# Patient Record
Sex: Female | Born: 1990
Health system: Southern US, Community
[De-identification: ages and names within clinical notes are randomized; demographics above are authoritative.]

## PROBLEM LIST (undated history)

## (undated) ENCOUNTER — Inpatient Hospital Stay (HOSPITAL_COMMUNITY): Payer: Self-pay

## (undated) DIAGNOSIS — K219 Gastro-esophageal reflux disease without esophagitis: Secondary | ICD-10-CM

## (undated) DIAGNOSIS — F32A Depression, unspecified: Secondary | ICD-10-CM

## (undated) DIAGNOSIS — D259 Leiomyoma of uterus, unspecified: Secondary | ICD-10-CM

## (undated) DIAGNOSIS — Z8619 Personal history of other infectious and parasitic diseases: Secondary | ICD-10-CM

## (undated) DIAGNOSIS — N951 Menopausal and female climacteric states: Secondary | ICD-10-CM

## (undated) DIAGNOSIS — M1990 Unspecified osteoarthritis, unspecified site: Secondary | ICD-10-CM

## (undated) DIAGNOSIS — R87619 Unspecified abnormal cytological findings in specimens from cervix uteri: Secondary | ICD-10-CM

## (undated) DIAGNOSIS — E282 Polycystic ovarian syndrome: Secondary | ICD-10-CM

## (undated) DIAGNOSIS — D649 Anemia, unspecified: Secondary | ICD-10-CM

## (undated) DIAGNOSIS — F329 Major depressive disorder, single episode, unspecified: Secondary | ICD-10-CM

## (undated) DIAGNOSIS — N83201 Unspecified ovarian cyst, right side: Secondary | ICD-10-CM

## (undated) DIAGNOSIS — G8929 Other chronic pain: Secondary | ICD-10-CM

## (undated) DIAGNOSIS — R51 Headache: Secondary | ICD-10-CM

## (undated) DIAGNOSIS — M549 Dorsalgia, unspecified: Secondary | ICD-10-CM

## (undated) DIAGNOSIS — F419 Anxiety disorder, unspecified: Secondary | ICD-10-CM

## (undated) DIAGNOSIS — R87629 Unspecified abnormal cytological findings in specimens from vagina: Secondary | ICD-10-CM

## (undated) DIAGNOSIS — I499 Cardiac arrhythmia, unspecified: Secondary | ICD-10-CM

## (undated) DIAGNOSIS — M797 Fibromyalgia: Secondary | ICD-10-CM

## (undated) DIAGNOSIS — J45909 Unspecified asthma, uncomplicated: Secondary | ICD-10-CM

## (undated) DIAGNOSIS — IMO0002 Reserved for concepts with insufficient information to code with codable children: Secondary | ICD-10-CM

## (undated) DIAGNOSIS — E039 Hypothyroidism, unspecified: Secondary | ICD-10-CM

## (undated) HISTORY — DX: Anxiety disorder, unspecified: F41.9

## (undated) HISTORY — DX: Polycystic ovarian syndrome: E28.2

## (undated) HISTORY — DX: Reserved for concepts with insufficient information to code with codable children: IMO0002

## (undated) HISTORY — DX: Unspecified abnormal cytological findings in specimens from vagina: R87.629

## (undated) HISTORY — DX: Unspecified osteoarthritis, unspecified site: M19.90

## (undated) HISTORY — PX: COLPOSCOPY: SHX161

## (undated) HISTORY — PX: OTHER SURGICAL HISTORY: SHX169

## (undated) HISTORY — DX: Personal history of other infectious and parasitic diseases: Z86.19

## (undated) HISTORY — DX: Dorsalgia, unspecified: M54.9

## (undated) HISTORY — DX: Headache: R51

## (undated) HISTORY — DX: Fibromyalgia: M79.7

## (undated) HISTORY — DX: Menopausal and female climacteric states: N95.1

## (undated) HISTORY — DX: Hypothyroidism, unspecified: E03.9

## (undated) HISTORY — DX: Other chronic pain: G89.29

## (undated) HISTORY — DX: Unspecified abnormal cytological findings in specimens from cervix uteri: R87.619

---
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
## 2008-04-16 ENCOUNTER — Emergency Department (HOSPITAL_COMMUNITY): Admission: EM | Admit: 2008-04-16 | Discharge: 2008-04-16 | Payer: Self-pay | Admitting: Emergency Medicine

## 2008-08-04 ENCOUNTER — Emergency Department (HOSPITAL_COMMUNITY): Admission: EM | Admit: 2008-08-04 | Discharge: 2008-08-04 | Payer: Self-pay | Admitting: Emergency Medicine

## 2009-08-25 ENCOUNTER — Emergency Department: Payer: Self-pay | Admitting: Emergency Medicine

## 2010-02-18 ENCOUNTER — Emergency Department (HOSPITAL_COMMUNITY): Admission: EM | Admit: 2010-02-18 | Discharge: 2010-02-18 | Payer: Self-pay | Admitting: Emergency Medicine

## 2010-10-08 ENCOUNTER — Emergency Department (HOSPITAL_COMMUNITY)
Admission: EM | Admit: 2010-10-08 | Discharge: 2010-10-08 | Disposition: A | Discharge status: Home / Self Care | Payer: Self-pay | Attending: Emergency Medicine | Admitting: Emergency Medicine

## 2010-10-08 DIAGNOSIS — N390 Urinary tract infection, site not specified: Secondary | ICD-10-CM | POA: Insufficient documentation

## 2010-10-08 DIAGNOSIS — R071 Chest pain on breathing: Secondary | ICD-10-CM | POA: Insufficient documentation

## 2010-10-08 DIAGNOSIS — M549 Dorsalgia, unspecified: Secondary | ICD-10-CM | POA: Insufficient documentation

## 2010-10-08 DIAGNOSIS — R109 Unspecified abdominal pain: Secondary | ICD-10-CM | POA: Insufficient documentation

## 2010-10-08 DIAGNOSIS — R10816 Epigastric abdominal tenderness: Secondary | ICD-10-CM | POA: Insufficient documentation

## 2010-10-08 LAB — DIFFERENTIAL
Basophils Absolute: 0 10*3/uL (ref 0.0–0.1)
Basophils Relative: 0 % (ref 0–1)
Eosinophils Relative: 1 % (ref 0–5)
Lymphocytes Relative: 33 % (ref 12–46)
Lymphs Abs: 1.6 10*3/uL (ref 0.7–4.0)
Monocytes Relative: 9 % (ref 3–12)
Neutro Abs: 2.7 10*3/uL (ref 1.7–7.7)
Neutrophils Relative %: 57 % (ref 43–77)

## 2010-10-08 LAB — COMPREHENSIVE METABOLIC PANEL
ALT: 18 U/L (ref 0–35)
Albumin: 4 g/dL (ref 3.5–5.2)
Alkaline Phosphatase: 98 U/L (ref 39–117)
BUN: 8 mg/dL (ref 6–23)
Calcium: 9.6 mg/dL (ref 8.4–10.5)
GFR calc Af Amer: >60 mL/min (ref >60)
GFR calc non Af Amer: >60 mL/min (ref >60)
Potassium: 3.1 mEq/L — ABNORMAL LOW (ref 3.5–5.1)
Sodium: 140 mEq/L (ref 135–145)
Total Protein: 7.7 g/dL (ref 6.0–8.3)

## 2010-10-08 LAB — URINALYSIS, ROUTINE W REFLEX MICROSCOPIC
Leukocytes, UA: NEGATIVE
Urobilinogen, UA: 0.2 mg/dL (ref 0.0–1.0)

## 2010-10-08 LAB — CBC
Hemoglobin: 12.1 g/dL (ref 12.0–15.0)
MCV: 85.3 fL (ref 78.0–100.0)
RDW: 12.9 % (ref 11.5–15.5)
WBC: 4.8 10*3/uL (ref 4.0–10.5)

## 2010-10-08 LAB — URINE MICROSCOPIC-ADD ON

## 2010-10-08 LAB — LIPASE, BLOOD: Lipase: 23 U/L (ref 11–59)

## 2010-10-15 LAB — URINE MICROSCOPIC-ADD ON

## 2010-10-15 LAB — POCT PREGNANCY, URINE: Preg Test, Ur: NEGATIVE

## 2010-10-15 LAB — URINALYSIS, ROUTINE W REFLEX MICROSCOPIC
Bilirubin Urine: NEGATIVE
Glucose, UA: NEGATIVE mg/dL
Ketones, ur: NEGATIVE mg/dL
Leukocytes, UA: NEGATIVE
Nitrite: NEGATIVE
Protein, ur: NEGATIVE mg/dL
Specific Gravity, Urine: >1.03 — ABNORMAL HIGH (ref 1.005–1.030)
Urobilinogen, UA: 0.2 mg/dL (ref 0.0–1.0)
pH: 6 (ref 5.0–8.0)

## 2011-05-01 LAB — URINALYSIS, ROUTINE W REFLEX MICROSCOPIC
Bilirubin Urine: NEGATIVE
Nitrite: NEGATIVE
Protein, ur: 100 — AB
Specific Gravity, Urine: 1.01

## 2011-05-01 LAB — PREGNANCY, URINE: Preg Test, Ur: NEGATIVE

## 2011-05-01 LAB — URINE MICROSCOPIC-ADD ON

## 2011-08-01 NOTE — L&D Delivery Note (Signed)
Delivery Note At 12:16 PM a viable and healthy female was delivered via  (Presentation: Left Occiput Anterior  ).  APGAR: 8, 9; weight pending.   Placenta status: spontaneous, in tact.  Cord: 3-vessel with the following complications: None.    Anesthesia: Epidural  Episiotomy: None  Lacerations: 1st degree vaginal Suture Repair: 3.0 vicryl 2 figure of eights Est. Blood Loss (mL): 350  Mom to postpartum.  Baby to nursery-stable. Mom plans to breast and bottle feed and get outpatient circumcision for baby.  [name] 04/25/2012, 12:41 PM

## 2011-08-01 NOTE — L&D Delivery Note (Signed)
I was present for the entire 2nd and 3rd stage of labor.  There were no complications.  Jermani Eberlein L. Harraway-Smith, M.D., [place]

## 2011-08-22 ENCOUNTER — Emergency Department (HOSPITAL_COMMUNITY)
Admission: EM | Admit: 2011-08-22 | Discharge: 2011-08-22 | Disposition: A | Discharge status: Home / Self Care | Payer: BC Managed Care – PPO | Attending: Emergency Medicine | Admitting: Emergency Medicine

## 2011-08-22 ENCOUNTER — Encounter (HOSPITAL_COMMUNITY): Payer: Self-pay | Admitting: Emergency Medicine

## 2011-08-22 ENCOUNTER — Emergency Department (HOSPITAL_COMMUNITY): Payer: BC Managed Care – PPO

## 2011-08-22 DIAGNOSIS — R109 Unspecified abdominal pain: Secondary | ICD-10-CM | POA: Insufficient documentation

## 2011-08-22 DIAGNOSIS — R197 Diarrhea, unspecified: Secondary | ICD-10-CM | POA: Insufficient documentation

## 2011-08-22 DIAGNOSIS — Z331 Pregnant state, incidental: Secondary | ICD-10-CM | POA: Insufficient documentation

## 2011-08-22 DIAGNOSIS — R1110 Vomiting, unspecified: Secondary | ICD-10-CM | POA: Insufficient documentation

## 2011-08-22 DIAGNOSIS — D259 Leiomyoma of uterus, unspecified: Secondary | ICD-10-CM | POA: Insufficient documentation

## 2011-08-22 LAB — URINALYSIS, ROUTINE W REFLEX MICROSCOPIC
Bilirubin Urine: NEGATIVE
Glucose, UA: NEGATIVE mg/dL
Ketones, ur: NEGATIVE mg/dL
Nitrite: NEGATIVE
Protein, ur: 30 mg/dL — AB
Specific Gravity, Urine: >1.03 — ABNORMAL HIGH (ref 1.005–1.030)
Urobilinogen, UA: 0.2 mg/dL (ref 0.0–1.0)
pH: 6 (ref 5.0–8.0)

## 2011-08-22 LAB — HEPATIC FUNCTION PANEL: Albumin: 4 g/dL (ref 3.5–5.2)

## 2011-08-22 LAB — BASIC METABOLIC PANEL
BUN: 7 mg/dL (ref 6–23)
Glucose, Bld: 84 mg/dL (ref 70–99)
Potassium: 3.6 mEq/L (ref 3.5–5.1)

## 2011-08-22 LAB — CBC
HCT: 31 % — ABNORMAL LOW (ref 36.0–46.0)
MCHC: 34.5 g/dL (ref 30.0–36.0)
RDW: 14 % (ref 11.5–15.5)
WBC: 3.7 10*3/uL — ABNORMAL LOW (ref 4.0–10.5)

## 2011-08-22 LAB — DIFFERENTIAL
Basophils Absolute: 0 10*3/uL (ref 0.0–0.1)
Basophils Relative: 0 % (ref 0–1)
Eosinophils Absolute: 0 10*3/uL (ref 0.0–0.7)
Eosinophils Relative: 1 % (ref 0–5)
Lymphs Abs: 1.4 10*3/uL (ref 0.7–4.0)
Monocytes Absolute: 0.3 10*3/uL (ref 0.1–1.0)
Monocytes Relative: 7 % (ref 3–12)
Neutrophils Relative %: 54 % (ref 43–77)

## 2011-08-22 LAB — HCG, QUANTITATIVE, PREGNANCY: hCG, Beta Chain, Quant, S: 351 m[IU]/mL — ABNORMAL HIGH (ref <5)

## 2011-08-22 LAB — LIPASE, BLOOD: Lipase: 12 U/L (ref 11–59)

## 2011-08-22 LAB — PREGNANCY, URINE: Preg Test, Ur: POSITIVE

## 2011-08-22 LAB — ABO/RH: ABO/RH(D): O POS

## 2011-08-22 MED ORDER — ONDANSETRON HCL 4 MG PO TABS: 4.0000 mg | ORAL_TABLET | Freq: Four times a day (QID) | ORAL | Status: AC

## 2011-08-22 MED ORDER — HYDROMORPHONE HCL PF 1 MG/ML IJ SOLN
0.5000 mg | Freq: Once | INTRAMUSCULAR | Status: AC
  Administered 2011-08-22: 0.5 mg via INTRAVENOUS
  Filled 2011-08-22: qty 1

## 2011-08-22 MED ORDER — ONDANSETRON HCL 4 MG/2ML IJ SOLN
4.0000 mg | Freq: Once | INTRAMUSCULAR | Status: AC
  Administered 2011-08-22: 4 mg via INTRAVENOUS
  Filled 2011-08-22: qty 2

## 2011-08-22 NOTE — ED Notes (Signed)
Pt DC to home with mother. 

## 2011-08-22 NOTE — ED Provider Notes (Signed)
History     CSN: [id number]  Arrival date & time 08/22/11  1136   First MD Initiated Contact with Patient 08/22/11 1202      Chief Complaint  Patient presents with  . Abdominal Pain  . Emesis    (Consider location/radiation/quality/duration/timing/severity/associated sxs/prior treatment) HPI Comments: Patient states she was recently diagnosed with urinary tract infection January 17. She has also been recently diagnosed with an ovarian cyst, and a fibroid tumor on  the uterus. In spite of antibiotic therapy she continues to have continued problems with pain of the lower abdomen, and in the last few days has had problems with vomiting. She states that he had a few episodes of diarrhea on January 20. She denies seeing any blood in the stool. There's no blood in the vomitus. She's not had any injury to the abdomen. She's not had any surgery or procedures on the abdomen in the past.  Patient is a 21 y.o. female presenting with abdominal pain and vomiting. The history is provided by the patient.  Abdominal Pain The primary symptoms of the illness include abdominal pain, vomiting and diarrhea. The primary symptoms of the illness do not include shortness of breath or dysuria.  Symptoms associated with the illness do not include hematuria, frequency or back pain.  Emesis  Associated symptoms include abdominal pain and diarrhea. Pertinent negatives include no arthralgias and no cough.    History reviewed. No pertinent past medical history.  History reviewed. No pertinent past surgical history.  History reviewed. No pertinent family history.  History  Substance Use Topics  . Smoking status: Never Smoker   . Smokeless tobacco: Not on file  . Alcohol Use: Yes    OB History    Grav Para Term Preterm Abortions TAB SAB Ect Mult Living                  Review of Systems  Constitutional: Negative for activity change.       All ROS Neg except as noted in HPI  HENT: Negative for  nosebleeds and neck pain.   Eyes: Negative for photophobia and discharge.  Respiratory: Negative for cough, shortness of breath and wheezing.   Cardiovascular: Negative for chest pain and palpitations.  Gastrointestinal: Positive for vomiting, abdominal pain and diarrhea. Negative for blood in stool.  Genitourinary: Negative for dysuria, frequency and hematuria.  Musculoskeletal: Negative for back pain and arthralgias.  Skin: Negative.   Neurological: Negative for dizziness, seizures and speech difficulty.  Psychiatric/Behavioral: Negative for hallucinations and confusion.    Allergies  Other  Home Medications  No current outpatient prescriptions on file.  BP 108/62  Pulse 84  Temp(Src) 98 F (36.7 C) (Oral)  Resp 18  Ht 5\' 5"  (1.651 m)  Wt 171 lb (77.565 kg)  BMI 28.46 kg/m2  SpO2 100%  LMP 08/18/2011  Physical Exam  Nursing note and vitals reviewed. Constitutional: She is oriented to person, place, and time. She appears well-developed and well-nourished.  Non-toxic appearance.  HENT:  Head: Normocephalic.  Right Ear: Tympanic membrane and external ear normal.  Left Ear: Tympanic membrane and external ear normal.  Eyes: EOM and lids are normal. Pupils are equal, round, and reactive to light.  Neck: Normal range of motion. Neck supple. Carotid bruit is not present.  Cardiovascular: Normal rate, regular rhythm, normal heart sounds, intact distal pulses and normal pulses.   Pulmonary/Chest: Breath sounds normal. No respiratory distress.  Abdominal: Soft. Bowel sounds are normal. There is tenderness. There is  no guarding.       There is tenderness in the epigastric area to palpation. There is pain to palpation of the suprapubic area and left lower quadrant of the abdomen. The abdomen is soft with good bowel sounds.  Musculoskeletal: Normal range of motion.  Lymphadenopathy:       Head (right side): No submandibular adenopathy present.       Head (left side): No submandibular  adenopathy present.    She has no cervical adenopathy.  Neurological: She is alert and oriented to person, place, and time. She has normal strength. No cranial nerve deficit or sensory deficit.  Skin: Skin is warm and dry.  Psychiatric: She has a normal mood and affect. Her speech is normal.    ED Course  Procedures (including critical care time)   Labs Reviewed  CBC  DIFFERENTIAL  BASIC METABOLIC PANEL  HEPATIC FUNCTION PANEL  LIPASE, BLOOD  URINALYSIS, ROUTINE W REFLEX MICROSCOPIC  PREGNANCY, URINE   No results found.   Dx: Pregnancy   MDM  I have reviewed nursing notes, vital signs, and all appropriate lab and imaging results for this patient. 1:50 patient made aware of lab results including POS preg test. She was unaware of the fact that she she might be pregnant. She states she had been told she could not get pregnant because of the" fibroid tumor". Plan for ultrasound and quantitative HCG discussed with the patient.  Ultrasound results discussed with the patient. Plan for the patient to be seen by her GYN physician in 10-14 days also discussed. Patient to use Tylenol for soreness. Prescription for Zofran every 6 hours given to the patient.     Kathie Dike, [place] 08/22/11 1644

## 2011-08-22 NOTE — ED Notes (Signed)
Pt c/o lower abd pain and vomiting. Pt states she was recently diagnosed with a uti and ovarian cyst.

## 2011-08-23 NOTE — ED Provider Notes (Signed)
Medical screening examination/treatment/procedure(s) were performed by non-physician practitioner and as supervising physician I was immediately available for consultation/collaboration.    Maud Rubendall L Anelle Parlow, MD 08/23/11 0708 

## 2011-09-13 LAB — OB RESULTS CONSOLE HEPATITIS B SURFACE ANTIGEN: Hepatitis B Surface Ag: NEGATIVE

## 2011-09-13 LAB — OB RESULTS CONSOLE RPR: RPR: NONREACTIVE

## 2011-09-13 LAB — OB RESULTS CONSOLE ABO/RH: RH Type: POSITIVE

## 2011-09-13 LAB — OB RESULTS CONSOLE RUBELLA ANTIBODY, IGM: Rubella: IMMUNE

## 2011-10-14 ENCOUNTER — Inpatient Hospital Stay (HOSPITAL_COMMUNITY): Payer: Medicaid Other

## 2011-10-14 ENCOUNTER — Inpatient Hospital Stay (HOSPITAL_COMMUNITY)
Admission: AD | Admit: 2011-10-14 | Discharge: 2011-10-14 | Disposition: A | Discharge status: Home / Self Care | Payer: Medicaid Other | Source: Ambulatory Visit | Attending: Family Medicine | Admitting: Family Medicine

## 2011-10-14 ENCOUNTER — Encounter (HOSPITAL_COMMUNITY): Payer: Self-pay | Admitting: Obstetrics and Gynecology

## 2011-10-14 DIAGNOSIS — R109 Unspecified abdominal pain: Secondary | ICD-10-CM

## 2011-10-14 DIAGNOSIS — O26899 Other specified pregnancy related conditions, unspecified trimester: Secondary | ICD-10-CM

## 2011-10-14 DIAGNOSIS — R1031 Right lower quadrant pain: Secondary | ICD-10-CM | POA: Insufficient documentation

## 2011-10-14 DIAGNOSIS — O99891 Other specified diseases and conditions complicating pregnancy: Secondary | ICD-10-CM | POA: Insufficient documentation

## 2011-10-14 LAB — URINALYSIS, ROUTINE W REFLEX MICROSCOPIC
Nitrite: NEGATIVE
Specific Gravity, Urine: 1.015 (ref 1.005–1.030)
Urobilinogen, UA: 0.2 mg/dL (ref 0.0–1.0)

## 2011-10-14 LAB — URINE MICROSCOPIC-ADD ON

## 2011-10-14 LAB — CBC
HCT: 30.1 % — ABNORMAL LOW (ref 36.0–46.0)
MCHC: 33.9 g/dL (ref 30.0–36.0)
MCV: 82.9 fL (ref 78.0–100.0)
RDW: 14.3 % (ref 11.5–15.5)

## 2011-10-14 NOTE — MAU Provider Note (Signed)
Chart reviewed and agree with management and plan.  

## 2011-10-14 NOTE — MAU Note (Signed)
Pt presents to MAU with chief complaint of right sided abdominal pain that started last night while at work; 10 PM. Pts approximately [redacted] weeks pregnant. Pain is described as sharp, intermittent, awoke patient from sleep. Pt rates the pain 3/10; no medications or interventions done by patient for pain relief.

## 2011-10-14 NOTE — MAU Provider Note (Signed)
History     CSN: [id number]  Arrival date and time: 10/14/11 4098   First Provider Initiated Contact with Patient 10/14/11 [phone]      Chief Complaint  Patient presents with  . Abdominal Pain   HPI Emily Morales 20 y.o. Client of [facility].  Comes to MAU with RLQ pain which radiates to RUQ.  Had pain last night.  Was able to sleep and pain awakened her this AM.  Denies nausea and vomiting.  Denies vaginal bleeding.  Has had 2 soft BMs in past 24 hours.  History of right ovarian cyst.  Did not take any medication for pain.  OB History    Grav Para Term Preterm Abortions TAB SAB Ect Mult Living   1 0 0 0 0 0 0 0 0 0       Past Medical History  Diagnosis Date  . Cyst     Past Surgical History  Procedure Date  . No past surgeries     Family History  Problem Relation Age of Onset  . Diabetes Father   . Hypertension Father     History  Substance Use Topics  . Smoking status: Never Smoker   . Smokeless tobacco: Not on file  . Alcohol Use: No    Allergies:  Allergies  Allergen Reactions  . Other     Coconut causes a rash    Prescriptions prior to admission  Medication Sig Dispense Refill  . ibuprofen (ADVIL,MOTRIN) 800 MG tablet Take 800 mg by mouth every 8 (eight) hours as needed. Pain      . sulfamethoxazole-trimethoprim (BACTRIM DS) 800-160 MG per tablet Take 1 tablet by mouth 2 (two) times daily.        Review of Systems  Gastrointestinal: Positive for abdominal pain. Negative for nausea, vomiting, diarrhea and constipation.  Genitourinary: Negative for dysuria.       No vaginal bleeding   Physical Exam   Blood pressure 110/63, pulse 74, temperature 97.3 F (36.3 C), temperature source Oral, resp. rate 18, last menstrual period 08/18/2011.  Physical Exam  Nursing note and vitals reviewed. Constitutional: She is oriented to person, place, and time. She appears well-developed and well-nourished.  HENT:  Head: Normocephalic.  Eyes: EOM are normal.    Neck: Neck supple.  GI: Soft. Bowel sounds are normal. There is tenderness. There is guarding. There is no rebound.       FHT + Pain to palpation in RLQ and RUQ (LQ>UQ)  Musculoskeletal: Normal range of motion.  Neurological: She is alert and oriented to person, place, and time.  Skin: Skin is warm and dry.  Psychiatric: She has a normal mood and affect.    MAU Course  Procedures Results for orders placed during the hospital encounter of 10/14/11 (from the past 24 hour(s))  URINALYSIS, ROUTINE W REFLEX MICROSCOPIC     Status: Abnormal   Collection Time   10/14/11  9:30 AM      Component Value Range   Color, Urine YELLOW  YELLOW    APPearance HAZY (*) CLEAR    Specific Gravity, Urine 1.015  1.005 - 1.030    pH 7.5  5.0 - 8.0    Glucose, UA NEGATIVE  NEGATIVE (mg/dL)   Hgb urine dipstick TRACE (*) NEGATIVE    Bilirubin Urine NEGATIVE  NEGATIVE    Ketones, ur NEGATIVE  NEGATIVE (mg/dL)   Protein, ur NEGATIVE  NEGATIVE (mg/dL)   Urobilinogen, UA 0.2  0.0 - 1.0 (mg/dL)   Nitrite  NEGATIVE  NEGATIVE    Leukocytes, UA TRACE (*) NEGATIVE   URINE MICROSCOPIC-ADD ON     Status: Abnormal   Collection Time   10/14/11  9:30 AM      Component Value Range   Squamous Epithelial / LPF FEW (*) RARE    WBC, UA 0-2  <3 (WBC/hpf)   RBC / HPF 0-2  <3 (RBC/hpf)   Bacteria, UA FEW (*) RARE    Urine-Other RARE YEAST    CBC     Status: Abnormal   Collection Time   10/14/11  9:57 AM      Component Value Range   WBC 4.2  4.0 - 10.5 (K/uL)   RBC 3.63 (*) 3.87 - 5.11 (MIL/uL)   Hemoglobin 10.2 (*) 12.0 - 15.0 (g/dL)   HCT 16.1 (*) 09.6 - 46.0 (%)   MCV 82.9  78.0 - 100.0 (fL)   MCH 28.1  26.0 - 34.0 (pg)   MCHC 33.9  30.0 - 36.0 (g/dL)   RDW 04.5  40.9 - 81.1 (%)   Platelets 191  150 - 400 (K/uL)   MDM Consult with Dr. Shawnie Pons re: plan of care - will R/O kidney stone and ovarian cyst.  Clinical Data: Right lower quadrant pain  OBSTETRIC <14 WK ULTRASOUND  Technique: Transabdominal ultrasound  was performed for evaluation  of the gestation as well as the maternal uterus and adnexal  regions.  Comparison: None.  Intrauterine gestational sac: Single  Yolk sac: Not identified  Embryo: Present  Cardiac Activity: Present  Heart Rate: 163 bpm  CRL: 52.7 mm 12w fived [place] EDC: 04/22/2012  Maternal uterus/Adnexae:  Normal ovaries. No subchorionic hemorrhage. No free fluid. Small  fibroid within the uterus measuring 1 cm.  IMPRESSION:  1. Single uterine gestation with embryonic normal cardiac  activity.  2. Estimated gestational age by crown-rump length equals 12 weeks  5 days  Clinical Data: Right lower quadrant pain, right flank pain, [redacted]  weeks pregnant  RENAL/URINARY TRACT ULTRASOUND COMPLETE  Comparison: Nine  Findings:  Right Kidney = 10.6 cm. No evidence of hydronephrosis, cyst, mass,  or stone.  Left kidney = 10.1 cm. No evidence of hydronephrosis, cyst, mass,  or stone.  Bladder: Normal bladder. Ureteral jets noted.  IMPRESSION:  Normal renal ultrasound.  1358 - currently client has no pain and is ready for discharge  Assessment and Plan  abd pain in pregnancy  Plan Keep appointments scheduled in the office Drink at least 8 8-oz glasses of water every day.' Take Tylenol 325 mg 2 tablets by mouth every 4 hours if needed for pain. No reason identified for pain today - tests were normal.  No subchorionic hemorrhage, no ovarian cysts.  Small fibroid noted but do not anticipate it interfering with the pregnancy. Call your doctor if your symptoms worsen.  Emily Morales 10/14/2011, 9:57 AM

## 2011-10-14 NOTE — Discharge Instructions (Signed)
Keep appointments scheduled in the office Drink at least 8 8-oz glasses of water every day. Take Tylenol 325 mg 2 tablets by mouth every 4 hours if needed for pain. Your tests were normal today.  No subchorionic hemorrhage, no ovarian cysts, no kidney stones seen.  Small uterine fibroid noted but do not anticipate it interfering with the pregnancy. Call your doctor if your symptoms worsen.

## 2012-01-31 ENCOUNTER — Inpatient Hospital Stay (HOSPITAL_COMMUNITY)
Admission: AD | Admit: 2012-01-31 | Discharge: 2012-01-31 | Disposition: A | Discharge status: Home / Self Care | Payer: Medicaid Other | Source: Ambulatory Visit | Attending: Obstetrics & Gynecology | Admitting: Obstetrics & Gynecology

## 2012-01-31 ENCOUNTER — Encounter (HOSPITAL_COMMUNITY): Payer: Self-pay

## 2012-01-31 DIAGNOSIS — N949 Unspecified condition associated with female genital organs and menstrual cycle: Secondary | ICD-10-CM | POA: Insufficient documentation

## 2012-01-31 DIAGNOSIS — R109 Unspecified abdominal pain: Secondary | ICD-10-CM | POA: Insufficient documentation

## 2012-01-31 HISTORY — DX: Unspecified ovarian cyst, right side: N83.201

## 2012-01-31 LAB — URINALYSIS, ROUTINE W REFLEX MICROSCOPIC
Bilirubin Urine: NEGATIVE
Glucose, UA: NEGATIVE mg/dL
Protein, ur: NEGATIVE mg/dL
Specific Gravity, Urine: 1.02 (ref 1.005–1.030)
Urobilinogen, UA: 0.2 mg/dL (ref 0.0–1.0)

## 2012-01-31 LAB — URINE MICROSCOPIC-ADD ON

## 2012-01-31 MED ORDER — CYCLOBENZAPRINE HCL 10 MG PO TABS: 10.0000 mg | ORAL_TABLET | Freq: Three times a day (TID) | ORAL | Status: AC | PRN

## 2012-01-31 NOTE — MAU Note (Signed)
WMuhammad, CNM notified pt in MAU for c/o lower abd pain, none present now, u/a pending, no ctx's on monitor thus far, efm tracing reactive, CNM to see pt in MAU.

## 2012-01-31 NOTE — MAU Provider Note (Signed)
History     CSN: [id number]  Arrival date and time: 01/31/12 4098   First Provider Initiated Contact with Patient 01/31/12 1031      Chief Complaint  Patient presents with  . Pelvic Pain   HPI  Pt is here with report of groin pain x 2 days that increases with pain with walking.  Denies vaginal bleeding or leaking of fluid.    Past Medical History  Diagnosis Date  . Ovarian cyst, right     Past Surgical History  Procedure Date  . No past surgeries     Family History  Problem Relation Age of Onset  . Diabetes Father   . Hypertension Father   . Other Neg Hx     History  Substance Use Topics  . Smoking status: Never Smoker   . Smokeless tobacco: Never Used  . Alcohol Use: No    Allergies:  Allergies  Allergen Reactions  . Other     Coconut causes a rash    Prescriptions prior to admission  Medication Sig Dispense Refill  . Prenatal Vit-Fe Fumarate-FA (PRENATAL MULTIVITAMIN) TABS Take 1 tablet by mouth daily.        Review of Systems  Musculoskeletal:       Groin pain  All other systems reviewed and are negative.   Physical Exam   Blood pressure 110/67, pulse 99, temperature 98.8 F (37.1 C), temperature source Oral, resp. rate 18, height 5\' 5"  (1.651 m), weight 84.823 kg (187 lb), last menstrual period 08/18/2011, SpO2 100.00%.  Physical Exam  Constitutional: She is oriented to person, place, and time. She appears well-developed and well-nourished.  HENT:  Head: Normocephalic.  Neck: Normal range of motion. Neck supple.  Cardiovascular: Normal rate, regular rhythm and normal heart sounds.   Respiratory: Effort normal and breath sounds normal.  GI: Soft. There is no tenderness.  Genitourinary: No bleeding around the vagina. Vaginal discharge (mucusy) found.       Cervix - closed  Neurological: She is alert and oriented to person, place, and time.  Skin: Skin is warm and dry.   FHR 130's, +accels, reactive Toco - none  MAU Course   Procedures Results for orders placed during the hospital encounter of 01/31/12 (from the past 24 hour(s))  URINALYSIS, ROUTINE W REFLEX MICROSCOPIC     Status: Abnormal   Collection Time   01/31/12  9:25 AM      Component Value Range   Color, Urine YELLOW  YELLOW   APPearance CLEAR  CLEAR   Specific Gravity, Urine 1.020  1.005 - 1.030   pH 6.0  5.0 - 8.0   Glucose, UA NEGATIVE  NEGATIVE mg/dL   Hgb urine dipstick SMALL (*) NEGATIVE   Bilirubin Urine NEGATIVE  NEGATIVE   Ketones, ur NEGATIVE  NEGATIVE mg/dL   Protein, ur NEGATIVE  NEGATIVE mg/dL   Urobilinogen, UA 0.2  0.0 - 1.0 mg/dL   Nitrite NEGATIVE  NEGATIVE   Leukocytes, UA SMALL (*) NEGATIVE  URINE MICROSCOPIC-ADD ON     Status: Abnormal   Collection Time   01/31/12  9:25 AM      Component Value Range   Squamous Epithelial / LPF FEW (*) RARE   WBC, UA 0-2  <3 WBC/hpf   RBC / HPF 0-2  <3 RBC/hpf   Urine-Other MUCOUS PRESENT       Assessment and Plan  Round Ligament Pain  Plan: DC to home RX Flexeril Urine to culture  [facility] 01/31/2012, 10:32 AM

## 2012-01-31 NOTE — MAU Note (Signed)
Patient states she has been having pelvic pain since 7-1. Was seen at [facility] yesterday and had blood in the urine, but was not treated for a UTI. Patient states pain is worse with walking. Denies any bleeding or leaking and reports good fetal movement.

## 2012-03-28 ENCOUNTER — Encounter (HOSPITAL_COMMUNITY): Payer: Self-pay | Admitting: *Deleted

## 2012-03-28 ENCOUNTER — Inpatient Hospital Stay (HOSPITAL_COMMUNITY)
Admission: AD | Admit: 2012-03-28 | Discharge: 2012-03-28 | Disposition: A | Discharge status: Home / Self Care | Payer: Medicaid Other | Source: Ambulatory Visit | Attending: Obstetrics and Gynecology | Admitting: Obstetrics and Gynecology

## 2012-03-28 DIAGNOSIS — R300 Dysuria: Secondary | ICD-10-CM | POA: Insufficient documentation

## 2012-03-28 DIAGNOSIS — R319 Hematuria, unspecified: Secondary | ICD-10-CM | POA: Insufficient documentation

## 2012-03-28 DIAGNOSIS — Z3490 Encounter for supervision of normal pregnancy, unspecified, unspecified trimester: Secondary | ICD-10-CM

## 2012-03-28 DIAGNOSIS — O99891 Other specified diseases and conditions complicating pregnancy: Secondary | ICD-10-CM | POA: Insufficient documentation

## 2012-03-28 LAB — URINALYSIS, ROUTINE W REFLEX MICROSCOPIC
Glucose, UA: NEGATIVE mg/dL
Nitrite: NEGATIVE
Protein, ur: NEGATIVE mg/dL

## 2012-03-28 LAB — URINE MICROSCOPIC-ADD ON

## 2012-03-28 NOTE — MAU Provider Note (Signed)
History     CSN: [id number]  Arrival date and time: 03/28/12 0102   None     Chief Complaint  Patient presents with  . Hematuria  . Dysuria   HPI Emily Morales 21 y.o. female  G1P0000 at [redacted]w[redacted]d presenting with small amounts of blood in toilet.   Today, patient noticed a tinge of blood in the toilet after she urinated, when she wiped she did not see any more blood and she has not noted any more blood in urine, in the toilet, in discharge since that time. Paitnet also noted some slight burnign with urination x 2-3x earlier today which has now resolved when she urinates. Patient wants to make sure it was not her mucus plug and she is going into labor. She thinks it was when she peed but she notes there was also some normal discharge and could have been meixed with that. Denies suprapubic pressure, polyuria, nausea, vomiting, fevers, CVA tenderness.   Patient denies contractions/decreased fetal movement/abnormal discharge/rush of fluid.   OB History    Grav Para Term Preterm Abortions TAB SAB Ect Mult Living   1 0 0 0 0 0 0 0 0 0       Past Medical History  Diagnosis Date  . Ovarian cyst, right     Past Surgical History  Procedure Date  . No past surgeries     Family History  Problem Relation Age of Onset  . Diabetes Father   . Hypertension Father   . Other Neg Hx     History  Substance Use Topics  . Smoking status: Never Smoker   . Smokeless tobacco: Never Used  . Alcohol Use: No    Allergies:  Allergies  Allergen Reactions  . Other     Coconut causes a rash    Prescriptions prior to admission  Medication Sig Dispense Refill  . Prenatal Vit-Fe Fumarate-FA (PRENATAL MULTIVITAMIN) TABS Take 1 tablet by mouth daily.        ROS see HPI Physical Exam   Blood pressure 133/70, pulse 98, temperature 98 F (36.7 C), temperature source Oral, resp. rate 18, height 5\' 5"  (1.651 m), weight 92.806 kg (204 lb 9.6 oz), last menstrual period 08/18/2011.  Physical Exam   Constitutional: She is oriented to person, place, and time. She appears well-developed and well-nourished.  HENT:  Head: Normocephalic and atraumatic.  Mouth/Throat: No oropharyngeal exudate.  Eyes: Conjunctivae and EOM are normal. Pupils are equal, round, and reactive to light.  Neck: Normal range of motion. Neck supple.  Cardiovascular: Normal rate and regular rhythm.  Exam reveals no gallop and no friction rub.   No murmur heard. Respiratory: Effort normal and breath sounds normal. She has no wheezes. She has no rales.  GI: Soft. Bowel sounds are normal.       Gravid  Genitourinary: Vagina normal and uterus normal. No vaginal discharge (normal pregnancy discharge noted) found.  Musculoskeletal: Normal range of motion. She exhibits no edema.       No CVA tenderness  Neurological: She is alert and oriented to person, place, and time.  Skin: Skin is warm and dry.   Dilation: Closed Effacement (%): Thick Cervical Position: Posterior Station: -3 Exam by:: Josuha Fontanez,MD  FHT-145 baseline, accels present, no decels, moderate variability. No contractions.   MAU Course  Procedures  MDM UA-poor sampling with many squamous cells and mucus present, small LE, few bacteria, 3-6 RBC.  Will send urine for culture and only treat if positive given resolution  of dysuria.    Assessment and Plan  Emily Morales 21 y.o. female  G1P0000 at [redacted]w[redacted]d presenting with likely blood tinged discharge.   Patient likely had some blood tinged discharge in toilet.  Will treat for UTI if urine culture positive. Patient given [facility].    Tana Conch 03/28/2012, 1:42 AM   Case discussed with Jacklyn Shell

## 2012-03-28 NOTE — MAU Provider Note (Signed)
I have seen and examined this patient and agree the above assessment. CRESENZO-DISHMAN,Annabel Gibeau 03/28/2012 6:38 AM

## 2012-03-28 NOTE — MAU Note (Signed)
Pt G1 at 35.3wks with specs of blood in urine tonight, none on tissue, feeling pressure and pain with urination.

## 2012-04-03 LAB — OB RESULTS CONSOLE GC/CHLAMYDIA: Chlamydia: POSITIVE

## 2012-04-14 ENCOUNTER — Encounter (HOSPITAL_COMMUNITY): Payer: Self-pay | Admitting: *Deleted

## 2012-04-14 ENCOUNTER — Inpatient Hospital Stay (HOSPITAL_COMMUNITY)
Admission: AD | Admit: 2012-04-14 | Discharge: 2012-04-14 | Disposition: A | Discharge status: Home / Self Care | Payer: Medicaid Other | Source: Ambulatory Visit | Attending: Obstetrics & Gynecology | Admitting: Obstetrics & Gynecology

## 2012-04-14 DIAGNOSIS — L293 Anogenital pruritus, unspecified: Secondary | ICD-10-CM | POA: Insufficient documentation

## 2012-04-14 DIAGNOSIS — B373 Candidiasis of vulva and vagina: Secondary | ICD-10-CM | POA: Insufficient documentation

## 2012-04-14 DIAGNOSIS — B379 Candidiasis, unspecified: Secondary | ICD-10-CM

## 2012-04-14 DIAGNOSIS — O2390 Unspecified genitourinary tract infection in pregnancy, unspecified trimester: Secondary | ICD-10-CM | POA: Insufficient documentation

## 2012-04-14 DIAGNOSIS — B3731 Acute candidiasis of vulva and vagina: Secondary | ICD-10-CM | POA: Insufficient documentation

## 2012-04-14 DIAGNOSIS — O99891 Other specified diseases and conditions complicating pregnancy: Secondary | ICD-10-CM | POA: Insufficient documentation

## 2012-04-14 DIAGNOSIS — B49 Unspecified mycosis: Secondary | ICD-10-CM

## 2012-04-14 MED ORDER — FLUCONAZOLE 150 MG PO TABS
150.0000 mg | ORAL_TABLET | Freq: Once | ORAL | Status: AC
  Administered 2012-04-14: 150 mg via ORAL
  Filled 2012-04-14: qty 1

## 2012-04-14 NOTE — MAU Provider Note (Signed)
  History     CSN: [id number]  Arrival date and time: 04/14/12 1834   None     Chief Complaint  Patient presents with  . Rupture of Membranes   HPI  Pt is here with report of leaking of fluid that started at 1300 today.  No vaginal bleeding or contractions.  +fetal movement.  Also reports vaginal itching and irritation since taking antibiotic for chlamydia.  Past Medical History  Diagnosis Date  . Ovarian cyst, right     Past Surgical History  Procedure Date  . No past surgeries     Family History  Problem Relation Age of Onset  . Diabetes Father   . Hypertension Father   . Other Neg Hx     History  Substance Use Topics  . Smoking status: Never Smoker   . Smokeless tobacco: Never Used  . Alcohol Use: No    Allergies:  Allergies  Allergen Reactions  . Other     Coconut causes a rash    Prescriptions prior to admission  Medication Sig Dispense Refill  . hydrocortisone cream 1 % Apply 1 application topically 2 (two) times daily as needed. For eczema        Review of Systems  Genitourinary:       Vaginal itching and discharge  All other systems reviewed and are negative.   Physical Exam   Blood pressure 136/68, pulse 94, temperature 98.3 F (36.8 C), temperature source Oral, resp. rate 18, height 5\' 6"  (1.676 m), weight 94.62 kg (208 lb 9.6 oz), last menstrual period 08/18/2011.  Physical Exam  Constitutional: She is oriented to person, place, and time. She appears well-developed and well-nourished. No distress.  HENT:  Head: Normocephalic.  Neck: Normal range of motion. Neck supple.  Cardiovascular: Normal rate, regular rhythm and normal heart sounds.   Respiratory: Effort normal and breath sounds normal. No respiratory distress.  GI: Soft. There is no tenderness. There is no CVA tenderness.  Genitourinary: Uterus is enlarged. Cervix exhibits no motion tenderness and no discharge. No bleeding around the vagina. Vaginal discharge (white, thick,  curd-like) found.       Cervix - closed  Musculoskeletal: Normal range of motion.  Neurological: She is alert and oriented to person, place, and time.  Skin: Skin is warm and dry.  Psychiatric: She has a normal mood and affect.    MAU Course  Procedures  Results for orders placed during the hospital encounter of 04/14/12 (from the past 24 hour(s))  AMNISURE RUPTURE OF MEMBRANE (ROM)     Status: Normal   Collection Time   04/14/12  8:55 PM      Component Value Range   Amnisure ROM NEGATIVE    FHR 130's, +accels, reactive Toco 4-8   Assessment and Plan  Yeast Infection  Plan: DC to home Diflucan 150 mg in MAU Keep scheduled appointment  [facility] 04/14/2012, 7:41 PM

## 2012-04-14 NOTE — MAU Note (Signed)
pt reports she has some leaking of fluid 2 times today and now having some more leak out. Reprots having pressure/ctx off an on this afternoon.

## 2012-04-19 ENCOUNTER — Encounter (HOSPITAL_COMMUNITY): Payer: Self-pay | Admitting: *Deleted

## 2012-04-19 ENCOUNTER — Telehealth (HOSPITAL_COMMUNITY): Payer: Self-pay | Admitting: *Deleted

## 2012-04-19 NOTE — Telephone Encounter (Signed)
Preadmission screen  

## 2012-04-23 ENCOUNTER — Encounter (HOSPITAL_COMMUNITY): Payer: Self-pay | Admitting: *Deleted

## 2012-04-23 ENCOUNTER — Inpatient Hospital Stay (HOSPITAL_COMMUNITY)
Admission: AD | Admit: 2012-04-23 | Discharge: 2012-04-24 | Disposition: A | Discharge status: Home / Self Care | Payer: Medicaid Other | Source: Ambulatory Visit | Attending: Obstetrics and Gynecology | Admitting: Obstetrics and Gynecology

## 2012-04-23 DIAGNOSIS — O479 False labor, unspecified: Secondary | ICD-10-CM

## 2012-04-23 DIAGNOSIS — Z3400 Encounter for supervision of normal first pregnancy, unspecified trimester: Secondary | ICD-10-CM

## 2012-04-23 NOTE — Progress Notes (Signed)
Dr Fara Boros discussing plan of care with pt. Will let pt walk awhile and reck cerivx.

## 2012-04-23 NOTE — MAU Note (Signed)
Pt G1 at 39.1wks having contractions since 1200 today.  Seen in the office today SVE 2 cm.

## 2012-04-24 ENCOUNTER — Encounter (HOSPITAL_COMMUNITY): Payer: Self-pay | Admitting: *Deleted

## 2012-04-24 ENCOUNTER — Inpatient Hospital Stay (HOSPITAL_COMMUNITY)
Admission: AD | Admit: 2012-04-24 | Discharge: 2012-04-27 | DRG: 774 | Disposition: A | Discharge status: Home / Self Care | Payer: Medicaid Other | Source: Ambulatory Visit | Attending: Obstetrics & Gynecology | Admitting: Obstetrics & Gynecology

## 2012-04-24 DIAGNOSIS — O99892 Other specified diseases and conditions complicating childbirth: Secondary | ICD-10-CM | POA: Diagnosis present

## 2012-04-24 DIAGNOSIS — Z22330 Carrier of Group B streptococcus: Secondary | ICD-10-CM

## 2012-04-24 MED ORDER — ZOLPIDEM TARTRATE 5 MG PO TABS
5.0000 mg | ORAL_TABLET | Freq: Once | ORAL | Status: AC
  Administered 2012-04-24: 5 mg via ORAL
  Filled 2012-04-24: qty 1

## 2012-04-24 NOTE — MAU Provider Note (Signed)
  History     CSN: [id number]  Arrival date and time: 04/23/12 2211   First Provider Initiated Contact with Patient 04/23/12 2245      Chief Complaint  Patient presents with  . Labor Eval   HPI Comments: 21 yo G1 @ 39.1 by 6wk U/S followed at [facility] presents with contractions starting around noon today.  She was seen in the office at 10am and had her membranes stripped by Dr. Despina Hidden.  Since that time she has had contractions, increasing in frequency from q6 min to q3-4 min. No LOF or vaginal bleeding; +FM.    OB History    Grav Para Term Preterm Abortions TAB SAB Ect Mult Living   1 0 0 0 0 0 0 0 0 0       Past Medical History  Diagnosis Date  . Ovarian cyst, right   . Abnormal Pap smear   . Hx of chlamydia infection   . Headache     Past Surgical History  Procedure Date  . No past surgeries   . Colposcopy     Family History  Problem Relation Age of Onset  . Diabetes Father   . Hypertension Father   . Other Neg Hx   . Cancer Maternal Grandmother     lung  . Heart disease Paternal Grandmother     History  Substance Use Topics  . Smoking status: Never Smoker   . Smokeless tobacco: Never Used  . Alcohol Use: No    Allergies:  Allergies  Allergen Reactions  . Other     Coconut causes a rash    Prescriptions prior to admission  Medication Sig Dispense Refill  . Emollient (EUCERIN) lotion Apply topically as needed.      . hydrocortisone cream 1 % Apply 1 application topically 2 (two) times daily as needed. For eczema        Review of Systems  Constitutional: Negative for fever.  Respiratory: Negative for shortness of breath.   Cardiovascular: Negative for chest pain.  Gastrointestinal: Positive for abdominal pain.  Neurological: Negative for dizziness and headaches.   Physical Exam   Blood pressure 131/71, pulse 91, temperature 98.1 F (36.7 C), temperature source Oral, resp. rate 18, height 5\' 6"  (1.676 m), weight 95.89 kg (211 lb 6.4 oz), last  menstrual period 08/18/2011.  Physical Exam  Constitutional: She appears well-developed and well-nourished. No distress.  Cardiovascular: Normal rate and regular rhythm.   Respiratory: Effort normal and breath sounds normal. No respiratory distress.  GI:       gravid  Musculoskeletal: She exhibits no edema.  Neurological: She is alert.  Skin: Skin is warm and dry.   2/60/-3 Dilation:  (SAME) Effacement (%): 60 Presentation: Vertex Exam by:: DR Shoshana Johal  FHT: 150, mod variability, + accel, no decel Toco: irregular ctx, q6-10; mild to mod  MAU Course  Procedures  MDM Pt with irregular ctx on monitor; will have pt walk x 2 hours then recheck  12:46AM: cervix unchanged on recheck  Assessment and Plan  22 yo G1 @ 39.1 p/w contractions, unchanged cervix after 2 hours -D/c home with labor precautions, kick counts -F/u at [facility] as scheduled  Mckenzye Cutright 04/24/2012, 12:42 AM

## 2012-04-24 NOTE — MAU Provider Note (Signed)
Attestation of Attending Supervision of Advanced Practitioner (CNM/NP): Evaluation and management procedures were performed by the Advanced Practitioner under my supervision and collaboration.  I have reviewed the Advanced Practitioner's note and chart, and I agree with the management and plan.  Valgene Deloatch 04/24/2012 7:24 AM

## 2012-04-25 ENCOUNTER — Encounter (HOSPITAL_COMMUNITY): Payer: Self-pay | Admitting: Anesthesiology

## 2012-04-25 ENCOUNTER — Encounter (HOSPITAL_COMMUNITY): Payer: Self-pay | Admitting: *Deleted

## 2012-04-25 ENCOUNTER — Inpatient Hospital Stay (HOSPITAL_COMMUNITY): Payer: Medicaid Other | Admitting: Anesthesiology

## 2012-04-25 DIAGNOSIS — O99892 Other specified diseases and conditions complicating childbirth: Secondary | ICD-10-CM

## 2012-04-25 DIAGNOSIS — O9989 Other specified diseases and conditions complicating pregnancy, childbirth and the puerperium: Secondary | ICD-10-CM

## 2012-04-25 LAB — CBC
HCT: 29.2 % — ABNORMAL LOW (ref 36.0–46.0)
Hemoglobin: 9.6 g/dL — ABNORMAL LOW (ref 12.0–15.0)
MCH: 26.9 pg (ref 26.0–34.0)
RBC: 3.57 MIL/uL — ABNORMAL LOW (ref 3.87–5.11)

## 2012-04-25 LAB — PREPARE RBC (CROSSMATCH)

## 2012-04-25 LAB — RPR: RPR Ser Ql: NONREACTIVE

## 2012-04-25 LAB — ABO/RH: ABO/RH(D): O POS

## 2012-04-25 MED ORDER — OXYCODONE-ACETAMINOPHEN 5-325 MG PO TABS
1.0000 | ORAL_TABLET | ORAL | Status: DC | PRN
  Administered 2012-04-26: 1 via ORAL
  Filled 2012-04-25: qty 1

## 2012-04-25 MED ORDER — PENICILLIN G POTASSIUM 5000000 UNITS IJ SOLR
5.0000 10*6.[IU] | Freq: Once | INTRAVENOUS | Status: AC
  Administered 2012-04-25: 5 10*6.[IU] via INTRAVENOUS
  Filled 2012-04-25: qty 5

## 2012-04-25 MED ORDER — INFLUENZA VIRUS VACC SPLIT PF IM SUSP
0.5000 mL | INTRAMUSCULAR | Status: AC
  Administered 2012-04-25: 0.5 mL via INTRAMUSCULAR

## 2012-04-25 MED ORDER — DIPHENHYDRAMINE HCL 50 MG/ML IJ SOLN: 12.5000 mg | INTRAMUSCULAR | Status: DC | PRN

## 2012-04-25 MED ORDER — LANOLIN HYDROUS EX OINT: TOPICAL_OINTMENT | CUTANEOUS | Status: DC | PRN

## 2012-04-25 MED ORDER — ONDANSETRON HCL 4 MG PO TABS: 4.0000 mg | ORAL_TABLET | ORAL | Status: DC | PRN

## 2012-04-25 MED ORDER — ZOLPIDEM TARTRATE 5 MG PO TABS: 5.0000 mg | ORAL_TABLET | Freq: Every evening | ORAL | Status: DC | PRN

## 2012-04-25 MED ORDER — CITRIC ACID-SODIUM CITRATE 334-500 MG/5ML PO SOLN
30.0000 mL | ORAL | Status: DC | PRN
  Filled 2012-04-25: qty 15

## 2012-04-25 MED ORDER — FENTANYL CITRATE 0.05 MG/ML IJ SOLN: 100.0000 ug | INTRAMUSCULAR | Status: DC | PRN

## 2012-04-25 MED ORDER — SENNOSIDES-DOCUSATE SODIUM 8.6-50 MG PO TABS
2.0000 | ORAL_TABLET | Freq: Every day | ORAL | Status: DC
  Administered 2012-04-25 – 2012-04-26 (×2): 2 via ORAL

## 2012-04-25 MED ORDER — TERBUTALINE SULFATE 1 MG/ML IJ SOLN: 0.2500 mg | Freq: Once | INTRAMUSCULAR | Status: DC | PRN

## 2012-04-25 MED ORDER — FENTANYL 2.5 MCG/ML BUPIVACAINE 1/10 % EPIDURAL INFUSION (WH - ANES)
14.0000 mL/h | INTRAMUSCULAR | Status: DC
  Administered 2012-04-25: 14 mL/h via EPIDURAL
  Filled 2012-04-25 (×2): qty 60

## 2012-04-25 MED ORDER — LIDOCAINE HCL (PF) 1 % IJ SOLN
30.0000 mL | INTRAMUSCULAR | Status: DC | PRN
  Filled 2012-04-25: qty 30

## 2012-04-25 MED ORDER — FENTANYL 2.5 MCG/ML BUPIVACAINE 1/10 % EPIDURAL INFUSION (WH - ANES)
INTRAMUSCULAR | Status: DC | PRN
  Administered 2012-04-25: 13 mL/h via EPIDURAL

## 2012-04-25 MED ORDER — PENICILLIN G POTASSIUM 5000000 UNITS IJ SOLR
2.5000 10*6.[IU] | INTRAVENOUS | Status: DC
  Administered 2012-04-25: 2.5 10*6.[IU] via INTRAVENOUS
  Filled 2012-04-25 (×6): qty 2.5

## 2012-04-25 MED ORDER — IBUPROFEN 600 MG PO TABS
600.0000 mg | ORAL_TABLET | Freq: Four times a day (QID) | ORAL | Status: DC | PRN
  Administered 2012-04-25: 600 mg via ORAL
  Filled 2012-04-25: qty 1

## 2012-04-25 MED ORDER — EPHEDRINE 5 MG/ML INJ
10.0000 mg | INTRAVENOUS | Status: DC | PRN
  Filled 2012-04-25: qty 4

## 2012-04-25 MED ORDER — PHENYLEPHRINE 40 MCG/ML (10ML) SYRINGE FOR IV PUSH (FOR BLOOD PRESSURE SUPPORT): 80.0000 ug | PREFILLED_SYRINGE | INTRAVENOUS | Status: DC | PRN

## 2012-04-25 MED ORDER — OXYCODONE-ACETAMINOPHEN 5-325 MG PO TABS: 1.0000 | ORAL_TABLET | ORAL | Status: DC | PRN

## 2012-04-25 MED ORDER — LACTATED RINGERS IV SOLN
500.0000 mL | INTRAVENOUS | Status: DC | PRN
  Administered 2012-04-25: 1000 mL via INTRAVENOUS

## 2012-04-25 MED ORDER — PRENATAL MULTIVITAMIN CH
1.0000 | ORAL_TABLET | Freq: Every day | ORAL | Status: DC
  Administered 2012-04-26 – 2012-04-27 (×2): 1 via ORAL
  Filled 2012-04-25 (×2): qty 1

## 2012-04-25 MED ORDER — IBUPROFEN 600 MG PO TABS
600.0000 mg | ORAL_TABLET | Freq: Four times a day (QID) | ORAL | Status: DC
  Administered 2012-04-25 – 2012-04-27 (×7): 600 mg via ORAL
  Filled 2012-04-25 (×7): qty 1

## 2012-04-25 MED ORDER — OXYTOCIN 40 UNITS IN LACTATED RINGERS INFUSION - SIMPLE MED
1.0000 m[IU]/min | INTRAVENOUS | Status: DC
  Administered 2012-04-25: 2 m[IU]/min via INTRAVENOUS
  Filled 2012-04-25: qty 1000

## 2012-04-25 MED ORDER — TETANUS-DIPHTH-ACELL PERTUSSIS 5-2.5-18.5 LF-MCG/0.5 IM SUSP
0.5000 mL | Freq: Once | INTRAMUSCULAR | Status: AC
  Administered 2012-04-25: 0.5 mL via INTRAMUSCULAR
  Filled 2012-04-25 (×2): qty 0.5

## 2012-04-25 MED ORDER — ONDANSETRON HCL 4 MG/2ML IJ SOLN: 4.0000 mg | INTRAMUSCULAR | Status: DC | PRN

## 2012-04-25 MED ORDER — BENZOCAINE-MENTHOL 20-0.5 % EX AERO
1.0000 "application " | INHALATION_SPRAY | CUTANEOUS | Status: DC | PRN
  Administered 2012-04-26: 1 via TOPICAL
  Filled 2012-04-25 (×2): qty 56

## 2012-04-25 MED ORDER — OXYTOCIN 40 UNITS IN LACTATED RINGERS INFUSION - SIMPLE MED
62.5000 mL/h | Freq: Once | INTRAVENOUS | Status: AC
  Administered 2012-04-25: 62.5 mL/h via INTRAVENOUS

## 2012-04-25 MED ORDER — PROMETHAZINE HCL 25 MG/ML IJ SOLN
12.5000 mg | Freq: Four times a day (QID) | INTRAMUSCULAR | Status: DC | PRN
  Administered 2012-04-25: 25 mg via INTRAMUSCULAR
  Filled 2012-04-25: qty 1

## 2012-04-25 MED ORDER — EPHEDRINE 5 MG/ML INJ: 10.0000 mg | INTRAVENOUS | Status: DC | PRN

## 2012-04-25 MED ORDER — ACETAMINOPHEN 325 MG PO TABS: 650.0000 mg | ORAL_TABLET | ORAL | Status: DC | PRN

## 2012-04-25 MED ORDER — MORPHINE SULFATE 4 MG/ML IJ SOLN
4.0000 mg | Freq: Once | INTRAMUSCULAR | Status: AC
  Administered 2012-04-25: 4 mg via INTRAMUSCULAR
  Filled 2012-04-25: qty 1

## 2012-04-25 MED ORDER — LACTATED RINGERS IV SOLN: 500.0000 mL | Freq: Once | INTRAVENOUS | Status: DC

## 2012-04-25 MED ORDER — LACTATED RINGERS IV SOLN
INTRAVENOUS | Status: DC
  Administered 2012-04-25: 08:00:00 via INTRAVENOUS

## 2012-04-25 MED ORDER — LIDOCAINE HCL (PF) 1 % IJ SOLN
INTRAMUSCULAR | Status: DC | PRN
  Administered 2012-04-25 (×2): 9 mL

## 2012-04-25 MED ORDER — DIBUCAINE 1 % RE OINT
1.0000 "application " | TOPICAL_OINTMENT | RECTAL | Status: DC | PRN
  Filled 2012-04-25 (×2): qty 28

## 2012-04-25 MED ORDER — ONDANSETRON HCL 4 MG/2ML IJ SOLN: 4.0000 mg | Freq: Four times a day (QID) | INTRAMUSCULAR | Status: DC | PRN

## 2012-04-25 MED ORDER — DIPHENHYDRAMINE HCL 25 MG PO CAPS: 25.0000 mg | ORAL_CAPSULE | Freq: Four times a day (QID) | ORAL | Status: DC | PRN

## 2012-04-25 MED ORDER — PHENYLEPHRINE 40 MCG/ML (10ML) SYRINGE FOR IV PUSH (FOR BLOOD PRESSURE SUPPORT)
80.0000 ug | PREFILLED_SYRINGE | INTRAVENOUS | Status: DC | PRN
  Filled 2012-04-25: qty 5

## 2012-04-25 MED ORDER — WITCH HAZEL-GLYCERIN EX PADS: 1.0000 "application " | MEDICATED_PAD | CUTANEOUS | Status: DC | PRN

## 2012-04-25 MED ORDER — SIMETHICONE 80 MG PO CHEW: 80.0000 mg | CHEWABLE_TABLET | ORAL | Status: DC | PRN

## 2012-04-25 MED ORDER — OXYTOCIN BOLUS FROM INFUSION
500.0000 mL | Freq: Once | INTRAVENOUS | Status: DC
  Filled 2012-04-25: qty 500

## 2012-04-25 NOTE — Anesthesia Procedure Notes (Signed)
Epidural Patient location during procedure: OB Start time: 04/25/2012 4:30 AM End time: 04/25/2012 4:36 AM  Staffing Anesthesiologist: Sandrea Hughs Performed by: anesthesiologist   Preanesthetic Checklist Completed: patient identified, site marked, surgical consent, pre-op evaluation, timeout performed, IV checked, risks and benefits discussed and monitors and equipment checked  Epidural Patient position: sitting Prep: site prepped and draped and DuraPrep Patient monitoring: continuous pulse ox and blood pressure Approach: midline Injection technique: LOR air  Needle:  Needle type: Tuohy  Needle gauge: 17 G Needle length: 9 cm and 9 Needle insertion depth: 5 cm cm Catheter type: closed end flexible Catheter size: 19 Gauge Catheter at skin depth: 10 cm Test dose: negative and Other  Assessment Sensory level: T9 Events: blood not aspirated, injection not painful, no injection resistance, negative IV test and no paresthesia  Additional Notes Reason for block:procedure for pain

## 2012-04-25 NOTE — Anesthesia Preprocedure Evaluation (Signed)
Anesthesia Evaluation  Patient identified by MRN, date of birth, ID band Patient awake    Reviewed: Allergy & Precautions, H&P , NPO status , Patient's Chart, lab work & pertinent test results  Airway Mallampati: II TM Distance: >3 FB Neck ROM: full    Dental No notable dental hx.    Pulmonary neg pulmonary ROS,    Pulmonary exam normal       Cardiovascular negative cardio ROS      Neuro/Psych negative psych ROS   GI/Hepatic negative GI ROS, Neg liver ROS,   Endo/Other  negative endocrine ROS  Renal/GU negative Renal ROS  negative genitourinary   Musculoskeletal negative musculoskeletal ROS (+)   Abdominal (+) + obese,   Peds negative pediatric ROS (+)  Hematology negative hematology ROS (+)   Anesthesia Other Findings   Reproductive/Obstetrics (+) Pregnancy                           Anesthesia Physical Anesthesia Plan  ASA: II  Anesthesia Plan: Epidural   Post-op Pain Management:    Induction:   Airway Management Planned:   Additional Equipment:   Intra-op Plan:   Post-operative Plan:   Informed Consent: I have reviewed the patients History and Physical, chart, labs and discussed the procedure including the risks, benefits and alternatives for the proposed anesthesia with the patient or authorized representative who has indicated his/her understanding and acceptance.     Plan Discussed with:   Anesthesia Plan Comments:         Anesthesia Quick Evaluation

## 2012-04-25 NOTE — Progress Notes (Signed)
Emily Morales is a 21 y.o. G1P0000 at [redacted]w[redacted]d by admitted for active labor.  Subjective: Comfortable w/epidural, no c/o.  Objective: BP 121/72  Pulse 92  Temp 99 F (37.2 C) (Oral)  Resp 20  Ht 5\' 6"  (1.676 m)  Wt 95.936 kg (211 lb 8 oz)  BMI 34.14 kg/m2  SpO2 99%  LMP 08/18/2011      FHT:  FHR: 155 bpm, variability: moderate,  accelerations:  Present,  decelerations:  Present variable, late UC:   regular, every 2-6 minutes SVE:   Dilation: 6 Effacement (%): 80 Station: -2 Exam by:: Fabian November, cnm-student  Labs: Lab Results  Component Value Date   WBC 10.7* 04/25/2012   HGB 9.6* 04/25/2012   HCT 29.2* 04/25/2012   MCV 81.8 04/25/2012   PLT 211 04/25/2012    Assessment / Plan: Spontaneous labor, progressing normally GBS positive on PCN  Labor: Progressing normally Fetal Wellbeing:  Category II Pain Control:  Epidural I/D:  n/a Anticipated MOD:  NSVD  Emily Morales 04/25/2012, 9:04 AM

## 2012-04-25 NOTE — Progress Notes (Signed)
Patient ID: Emily Morales, female   DOB: October 23, 1990, 23 y.o.   MRN: [id number] S:  Patient blocked and comfortable  O:   Filed Vitals:   04/25/12 0902  BP: 134/71  Pulse: 87  Temp:   Resp: 18   SVE:  7/100/-2, bulging bag AROM, clear  FHT:  140, moderate variability, accels present, occasional variable CTX:  q 1 to 6 minutes  A/P Slow progression in active labor. AROM, start pitocin.  Napoleon Form, MD

## 2012-04-25 NOTE — MAU Provider Note (Signed)
  Emily Morales is  21 y.o. G1P0000 at [redacted]w[redacted]d presents complaining of regular, painful ctx. She denies VB and LOF, and reports +FM.  She has been seen in MAU on two other occasions over the last 2 days for regular ctx and was found to be in false labor. She has been obtaining care at [facility].  Obstetrical/Gynecological History: Menstrual History: OB History    Grav Para Term Preterm Abortions TAB SAB Ect Mult Living   1 0 0 0 0 0 0 0 0 0       Patient's last menstrual period was 08/18/2011.     Past Medical History: Past Medical History  Diagnosis Date  . Ovarian cyst, right   . Abnormal Pap smear   . Hx of chlamydia infection   . Headache     Past Surgical History: Past Surgical History  Procedure Date  . No past surgeries   . Colposcopy     Family History: Family History  Problem Relation Age of Onset  . Diabetes Father   . Hypertension Father   . Other Neg Hx   . Cancer Maternal Grandmother     lung  . Heart disease Paternal Grandmother     Social History: History  Substance Use Topics  . Smoking status: Never Smoker   . Smokeless tobacco: Never Used  . Alcohol Use: No    Allergies:  Allergies  Allergen Reactions  . Other     Coconut causes a rash    Meds:  Prescriptions prior to admission  Medication Sig Dispense Refill  . Emollient (EUCERIN) lotion Apply topically as needed.      . hydrocortisone cream 1 % Apply 1 application topically 2 (two) times daily as needed. For eczema        Review of Systems - Please refer to the aforementioned patients' reports.     Physical Exam  Blood pressure 127/74, pulse 106, temperature 98.8 F (37.1 C), temperature source Oral, resp. rate 20, height 5\' 6"  (1.676 m), weight 95.936 kg (211 lb 8 oz), last menstrual period 08/18/2011, SpO2 100.00%. GENERAL: Well-developed, well-nourished female in no acute distress.  LUNGS: Clear to auscultation bilaterally.  HEART: Regular rate and rhythm. ABDOMEN: Soft,  nontender, nondistended, gravid.  FHT:  Baseline rate 150 bpm   Variability moderate  Accelerations present   Decelerations none Contractions: Every 4-5 mins  Dilation: 3 Effacement (%): 80 Cervical Position: Middle Station: -3 Presentation: Vertex Exam by:: M.Topp,RN (second exam with no change after walking) Labs: No results found for this or any previous visit (from the past 24 hour(s)). Imaging Studies:  No results found.  Assessment: IUP@39 .3 Latent labor   Plan: Therapeutic rest with IM Morphine. Recheck in an hr or two.  Lawernce Pitts 9/26/20131:17 AM

## 2012-04-25 NOTE — Progress Notes (Signed)
Patient ID: Emily Morales, female   DOB: 1991/02/21, 21 y.o.   MRN: [id number] S:  Baby having deep variables. O2 given, pt repositioned.   Pt blocked and feeling pressure.  O:   Filed Vitals:   04/25/12 1003 04/25/12 1032 04/25/12 1102 04/25/12 1132  BP: 130/67 127/79 142/82 110/61  Pulse: 87 96 94 88  Temp:      TempSrc:  Oral    Resp: 20 20 18 20   Height:      Weight:      SpO2:         SVE:  7-8/80/-2 at first check.   FHTs:  140, moderate variability, accels present, variables with each contraction to 90s with good recovery.  A/P Progressing well on ptiocin. Was about to place IUPC and FSE but patient now complete and +2.  Will proceed to delivery.  Napoleon Form, MD

## 2012-04-25 NOTE — Anesthesia Postprocedure Evaluation (Signed)
  Anesthesia Post-op Note  Patient: Emily Morales  Procedure(s) Performed: * No procedures listed *  Patient Location: Mother/Baby  Anesthesia Type: Epidural  Level of Consciousness: awake  Airway and Oxygen Therapy: Patient Spontanous Breathing  Post-op Pain: mild  Post-op Assessment: Patient's Cardiovascular Status Stable and Respiratory Function Stable  Post-op Vital Signs: stable  Complications: No apparent anesthesia complications

## 2012-04-25 NOTE — H&P (Signed)
Emily Morales is a 21 y.o. female G1P0 @ 39.3 wks presenting for spontaneous onset of labor with cervical change while in MAU after therapeutic rest with Morphine. Maternal Medical History:  Reason for admission: Reason for admission: contractions.  Contractions: Onset was 1-2 hours ago.   Frequency: regular.   Perceived severity is strong.    Fetal activity: Perceived fetal activity is normal.   Last perceived fetal movement was within the past hour.      OB History    Grav Para Term Preterm Abortions TAB SAB Ect Mult Living   1 0 0 0 0 0 0 0 0 0      Past Medical History  Diagnosis Date  . Ovarian cyst, right   . Abnormal Pap smear   . Hx of chlamydia infection   . Headache    Past Surgical History  Procedure Date  . No past surgeries   . Colposcopy    Family History: family history includes Cancer in her maternal grandmother; Diabetes in her father; Heart disease in her paternal grandmother; and Hypertension in her father.  There is no history of Other. Social History:  reports that she has never smoked. She has never used smokeless tobacco. She reports that she does not drink alcohol or use illicit drugs.   Prenatal Transfer Tool  Maternal Diabetes: No Genetic Screening: Normal Maternal Ultrasounds/Referrals: Normal Fetal Ultrasounds or other Referrals:  None Maternal Substance Abuse:  No Significant Maternal Medications:  None Significant Maternal Lab Results:  Lab values include: Other:  chlamydia on 04/03/12 Other Comments:  None  Review of Systems  Constitutional: Negative.   Eyes: Negative.   Respiratory: Negative.   Cardiovascular: Negative.   Gastrointestinal: Negative.   Genitourinary: Negative.   Musculoskeletal: Negative.   Skin: Negative.   Neurological: Negative.   Psychiatric/Behavioral: Negative.     Dilation: 4 Effacement (%): 80 Station: -2 Exam by:: M.Topp,RN Blood pressure 130/57, pulse 97, temperature 98.8 F (37.1 C), temperature  source Oral, resp. rate 20, height 5\' 6"  (1.676 m), weight 95.936 kg (211 lb 8 oz), last menstrual period 08/18/2011, SpO2 100.00%. Maternal Exam:  Uterine Assessment: Contraction strength is moderate.  Contraction frequency is regular.   Abdomen: Patient reports no abdominal tenderness.   Fetal Exam Fetal Monitor Review: Mode: ultrasound.   Baseline rate: 140.  Variability: moderate (6-25 bpm).   Pattern: accelerations present and no decelerations.    Fetal State Assessment: Category I - tracings are normal.     Physical Exam  Constitutional: She is oriented to person, place, and time. She appears well-developed.  HENT:  Head: Normocephalic.  Neck: Normal range of motion.  Cardiovascular: Normal rate.   Respiratory: Effort normal.  GI: Soft.  Genitourinary: Vagina normal.  Musculoskeletal: Normal range of motion.  Neurological: She is alert and oriented to person, place, and time.  Skin: Skin is warm and dry.  Psychiatric: She has a normal mood and affect.    Prenatal labs: ABO, Rh: O/Positive/-- (02/13 0000) Antibody: Negative (02/13 0000) Rubella: Immune (02/13 0000) RPR: Nonreactive (02/13 0000)  HBsAg: Negative (02/13 0000)  HIV: Non-reactive (02/13 0000)  GBS: Positive (09/04 0000)   Assessment/Plan: IUP@39 .3  Early labor GBS positive Cat I fetal tracing  Admit, efm per policy, analgesia prn, PCN for GBS anticipate SVD.  Lawernce Pitts 04/25/2012, 3:22 AM

## 2012-04-25 NOTE — Progress Notes (Signed)
Emily Morales is a 21 y.o. G1P0000 at [redacted]w[redacted]d by admitted for spontaneous labor.  Subjective: Comfortable w/epidural  Objective: BP 107/53  Pulse 106  Temp 98.5 F (36.9 C) (Oral)  Resp 18  Ht 5\' 6"  (1.676 m)  Wt 95.936 kg (211 lb 8 oz)  BMI 34.14 kg/m2  SpO2 99%  LMP 08/18/2011      FHT:  FHR: 150 bpm, variability: moderate,  accelerations:  Present,  decelerations:  Present variables UC:   irregular, every 2-6 minutes SVE: 6/80/-2 Labs: Lab Results  Component Value Date   WBC 10.7* 04/25/2012   HGB 9.6* 04/25/2012   HCT 29.2* 04/25/2012   MCV 81.8 04/25/2012   PLT 211 04/25/2012    Assessment / Plan: Spontaneous labor, progressing normally  Labor: Progressing normally Fetal Wellbeing:  Category II Pain Control:  Epidural I/D:  n/a Anticipated MOD:  NSVD  Lawernce Pitts 04/25/2012, 6:11 AM

## 2012-04-26 LAB — CBC
Hemoglobin: 7.1 g/dL — ABNORMAL LOW (ref 12.0–15.0)
MCV: 82.8 fL (ref 78.0–100.0)
Platelets: 174 10*3/uL (ref 150–400)
RBC: 2.67 MIL/uL — ABNORMAL LOW (ref 3.87–5.11)
WBC: 8.7 10*3/uL (ref 4.0–10.5)

## 2012-04-26 LAB — GC/CHLAMYDIA PROBE AMP, URINE: GC Probe Amp, Urine: NEGATIVE

## 2012-04-26 NOTE — Progress Notes (Signed)
Post Partum Day 1  Subjective: up ad lib, voiding, tolerating PO, + flatus and complaining of some pain with urination, heavy bleeding with change of 3 pads yesterday and one today; denies dizziness or shortness of breath.  Interested in lactation consultation today to decide if wants to continue bf.  Objective: Blood pressure 105/68, pulse 92, temperature 98.3 F (36.8 C), temperature source Oral, resp. rate 18, height 5\' 6"  (1.676 m), weight 95.936 kg (211 lb 8 oz), last menstrual period 08/18/2011, SpO2 100.00%, unknown if currently breastfeeding.  Physical Exam:  General: alert, cooperative, appears stated age and no distress CV: 2+ bilateral DP pulses PULM: nl effort Lochia: appropriate Uterine Fundus: firm Incision: n/a DVT Evaluation: No evidence of DVT seen on physical exam. No significant calf/ankle edema.   Basename 04/25/12 0345  HGB 9.6*  HCT 29.2*    Assessment/Plan: Plan for discharge tomorrow, Lactation consult and Contraception ; follow-up GC/Chlamydia Pt planning for depo shot at [facility], circumcision at [facility], and bottle-feeding with potential to BF if lactation c/s goes well today.   LOS: 2 days   [name] 04/26/2012, 7:43 AM    I have seen and examined this patient and agree the above assessment. CRESENZO-DISHMAN,Kennis Wissmann 04/26/2012 8:11 AM

## 2012-04-27 MED ORDER — SENNOSIDES-DOCUSATE SODIUM 8.6-50 MG PO TABS: 2.0000 | ORAL_TABLET | Freq: Every day | ORAL | Status: DC

## 2012-04-27 MED ORDER — FERROUS SULFATE 325 (65 FE) MG PO TABS: 325.0000 mg | ORAL_TABLET | Freq: Three times a day (TID) | ORAL | Status: DC

## 2012-04-27 MED ORDER — IBUPROFEN 600 MG PO TABS: 600.0000 mg | ORAL_TABLET | Freq: Four times a day (QID) | ORAL | Status: DC

## 2012-04-27 NOTE — Discharge Summary (Signed)
Obstetric Discharge Summary Reason for Admission: onset of labor Prenatal Procedures: ultrasound Intrapartum Procedures: spontaneous vaginal delivery Postpartum Procedures: none Complications-Operative and Postpartum: hemorrhage (in post-partum period, no RBC required) Hemoglobin  Date Value Range Status  04/26/2012 7.1* 12.0 - 15.0 g/dL Final     DELTA CHECK NOTED     REPEATED TO VERIFY     HCT  Date Value Range Status  04/26/2012 22.1* 36.0 - 46.0 % Final    Physical Exam:  General: alert, cooperative and no distress Lochia: appropriate Uterine Fundus: firm Incision: n/a DVT Evaluation: No evidence of DVT seen on physical exam.  Discharge Diagnoses: Term Pregnancy-delivered  Discharge Information: Date: 04/27/2012 Activity: pelvic rest Diet: routine Medications: PNV, Ibuprofen, Colace and Iron Condition: stable Instructions: refer to practice specific booklet Discharge to: home Follow-up Information    Follow up with [facility]. Schedule an appointment as soon as possible for a visit in 6 weeks.   Contact information:   [address] [place] [address] [phone]         Newborn Data: Live born female  Birth Weight: 7 lb 12.7 oz (3535 g) APGAR: 8, 9  Home with mother.  Kristain Filo 04/27/2012, 7:32 AM

## 2012-04-27 NOTE — Discharge Summary (Signed)
Saw patient and I agree with note  ARNOLD,JAMES 04/27/2012

## 2012-04-28 LAB — TYPE AND SCREEN: Antibody Screen: NEGATIVE

## 2012-05-01 NOTE — MAU Provider Note (Signed)
I agree with the above. Cam Hai 12:35 AM 05/01/2012

## 2012-05-01 NOTE — Progress Notes (Signed)
I have seen and examined this patient and I agree with the above. Cam Hai 12:32 AM 05/01/2012

## 2012-05-01 NOTE — Progress Notes (Signed)
I have seen and examined this patient and I agree with the above. Emily Morales 12:31 AM 05/01/2012

## 2012-06-19 ENCOUNTER — Other Ambulatory Visit (HOSPITAL_COMMUNITY)
Admission: RE | Admit: 2012-06-19 | Discharge: 2012-06-19 | Disposition: A | Discharge status: Home / Self Care | Payer: Medicaid Other | Source: Ambulatory Visit | Attending: Obstetrics and Gynecology | Admitting: Obstetrics and Gynecology

## 2012-06-19 ENCOUNTER — Other Ambulatory Visit: Payer: Self-pay | Admitting: Adult Health

## 2012-06-19 DIAGNOSIS — Z113 Encounter for screening for infections with a predominantly sexual mode of transmission: Secondary | ICD-10-CM | POA: Insufficient documentation

## 2012-06-19 DIAGNOSIS — Z01419 Encounter for gynecological examination (general) (routine) without abnormal findings: Secondary | ICD-10-CM | POA: Insufficient documentation

## 2012-08-25 ENCOUNTER — Emergency Department (HOSPITAL_COMMUNITY)
Admission: EM | Admit: 2012-08-25 | Discharge: 2012-08-25 | Disposition: A | Discharge status: Home / Self Care | Payer: Medicaid Other | Attending: Emergency Medicine | Admitting: Emergency Medicine

## 2012-08-25 ENCOUNTER — Encounter (HOSPITAL_COMMUNITY): Payer: Self-pay | Admitting: *Deleted

## 2012-08-25 DIAGNOSIS — Z8742 Personal history of other diseases of the female genital tract: Secondary | ICD-10-CM | POA: Insufficient documentation

## 2012-08-25 DIAGNOSIS — L738 Other specified follicular disorders: Secondary | ICD-10-CM | POA: Insufficient documentation

## 2012-08-25 DIAGNOSIS — L739 Follicular disorder, unspecified: Secondary | ICD-10-CM

## 2012-08-25 DIAGNOSIS — Z8619 Personal history of other infectious and parasitic diseases: Secondary | ICD-10-CM | POA: Insufficient documentation

## 2012-08-25 DIAGNOSIS — Z8669 Personal history of other diseases of the nervous system and sense organs: Secondary | ICD-10-CM | POA: Insufficient documentation

## 2012-08-25 DIAGNOSIS — IMO0002 Reserved for concepts with insufficient information to code with codable children: Secondary | ICD-10-CM | POA: Insufficient documentation

## 2012-08-25 MED ORDER — CLINDAMYCIN PHOSPHATE 1 % EX SOLN: Freq: Two times a day (BID) | CUTANEOUS | Status: DC

## 2012-08-25 NOTE — ED Notes (Signed)
Pt states she began noticing a rash 2 weeks ago in small areas and has noticed more spots recently. Pt states some areas are painful and have white heads in center.

## 2012-08-25 NOTE — ED Provider Notes (Signed)
History     CSN: [id number]  Arrival date & time 08/25/12  0018   First MD Initiated Contact with Patient 08/25/12 0059      Chief Complaint  Patient presents with  . Rash    (Consider location/radiation/quality/duration/timing/severity/associated sxs/prior treatment) HPISade R Mayford Morales is a 22 y.o. female presents with painful and itchy rash that had sprung up in different crops over the course of the last 3 weeks. Patient knows that she is using a new [employer] body wash started about 4 weeks ago. No one else in her home has a similar rash. The rashes are single points, they're painful and itchy, they're moderate to severe, they do resolve by themselves. She was given some hydrocortisone cream which does not help. She denies any fevers, chills, lightheadedness, any concurrent abdominal pain, chest pain, recent upper respiratory infection, nausea or vomiting   Past Medical History  Diagnosis Date  . Ovarian cyst, right   . Abnormal Pap smear   . Hx of chlamydia infection   . Headache     Past Surgical History  Procedure Date  . No past surgeries   . Colposcopy     Family History  Problem Relation Age of Onset  . Diabetes Father   . Hypertension Father   . Other Neg Hx   . Cancer Maternal Grandmother     lung  . Heart disease Paternal Grandmother     History  Substance Use Topics  . Smoking status: Never Smoker   . Smokeless tobacco: Never Used  . Alcohol Use: No    OB History    Grav Para Term Preterm Abortions TAB SAB Ect Mult Living   1 1 1  0 0 0 0 0 0 1      Review of Systems At least 10pt or greater review of systems completed and are negative except where specified in the HPI.  Allergies  Other  Home Medications   Current Outpatient Rx  Name  Route  Sig  Dispense  Refill  . FERROUS SULFATE 325 (65 FE) MG PO TABS   Oral   Take 1 tablet (325 mg total) by mouth 3 (three) times daily with meals.   90 tablet   3   . HYDROCORTISONE 1 % EX CREA  Topical   Apply 1 application topically 2 (two) times daily as needed. For eczema         . IBUPROFEN 600 MG PO TABS   Oral   Take 1 tablet (600 mg total) by mouth every 6 (six) hours.   30 tablet   1   . SENNOSIDES-DOCUSATE SODIUM 8.6-50 MG PO TABS   Oral   Take 2 tablets by mouth at bedtime.   60 tablet   1     BP 119/73  Pulse 74  Temp 98 F (36.7 C) (Oral)  Resp 16  Ht 5\' 5"  (1.651 m)  Wt 171 lb (77.565 kg)  BMI 28.46 kg/m2  SpO2 98%  LMP 08/13/2012  Physical Exam  Nursing notes reviewed.  Electronic medical record reviewed. VITAL SIGNS:   Filed Vitals:   08/25/12 0038  BP: 119/73  Pulse: 74  Temp: 98 F (36.7 C)  TempSrc: Oral  Resp: 16  Height: 5\' 5"  (1.651 m)  Weight: 171 lb (77.565 kg)  SpO2: 98%   CONSTITUTIONAL: Awake, oriented, appears non-toxic HENT: Atraumatic, normocephalic, oral mucosa pink and moist, airway patent. Nares patent without drainage. External ears normal. EYES: Conjunctiva clear, EOMI, PERRLA NECK: Trachea midline,  non-tender, supple CARDIOVASCULAR: Normal heart rate, Normal rhythm, No murmurs, rubs, gallops PULMONARY/CHEST: Clear to auscultation, no rhonchi, wheezes, or rales. Symmetrical breath sounds. Non-tender. ABDOMINAL: Non-distended, soft, obese, non-tender - no rebound or guarding.  BS normal. NEUROLOGIC: Non-focal, moving all four extremities, no gross sensory or motor deficits. EXTREMITIES: No clubbing, cyanosis, or edema SKIN: Warm, Dry, No erythema. Scattered folliculitis. Scattered papules with central pustules around individual hair follicles are scattered across the patient's upper extremities abdomen and on the back.   ED Course  Procedures (including critical care time)  Labs Reviewed - No data to display No results found.   1. Folliculitis       MDM  Emily Morales is a 22 y.o. female presents with scattered folliculitis.  Patient does have a new [place] product which she is using 2 as a body wash.  Instructed the patient to stop using this product and to avoid using antibacterial soap which she's also been using. We'll give the patient some clindamycin lotion to help clear up the individual folliculitis. My guess is that her [place] product is irritating her skin causing poor causing some folliculitis that is widespread. This does not look like arthropod bites, there is no involvement of the mucous membranes, no suggestion of SJS/GEN.  I explained the diagnosis and have given explicit precautions to return to the ER including any other new or worsening symptoms. The patient understands and accepts the medical plan as it's been dictated and I have answered their questions. Discharge instructions concerning home care and prescriptions have been given.  The patient is STABLE and is discharged to home in good condition.          Jones Skene, MD 08/25/12 [phone]

## 2012-09-02 ENCOUNTER — Encounter (HOSPITAL_COMMUNITY): Payer: Self-pay | Admitting: *Deleted

## 2012-09-02 ENCOUNTER — Emergency Department (HOSPITAL_COMMUNITY)
Admission: EM | Admit: 2012-09-02 | Discharge: 2012-09-02 | Disposition: A | Discharge status: Home / Self Care | Payer: Medicaid Other | Attending: Emergency Medicine | Admitting: Emergency Medicine

## 2012-09-02 DIAGNOSIS — Z8619 Personal history of other infectious and parasitic diseases: Secondary | ICD-10-CM | POA: Insufficient documentation

## 2012-09-02 DIAGNOSIS — R21 Rash and other nonspecific skin eruption: Secondary | ICD-10-CM | POA: Insufficient documentation

## 2012-09-02 DIAGNOSIS — Z3202 Encounter for pregnancy test, result negative: Secondary | ICD-10-CM | POA: Insufficient documentation

## 2012-09-02 DIAGNOSIS — L739 Follicular disorder, unspecified: Secondary | ICD-10-CM

## 2012-09-02 DIAGNOSIS — Z8742 Personal history of other diseases of the female genital tract: Secondary | ICD-10-CM | POA: Insufficient documentation

## 2012-09-02 DIAGNOSIS — Z79899 Other long term (current) drug therapy: Secondary | ICD-10-CM | POA: Insufficient documentation

## 2012-09-02 DIAGNOSIS — N760 Acute vaginitis: Secondary | ICD-10-CM | POA: Insufficient documentation

## 2012-09-02 DIAGNOSIS — L738 Other specified follicular disorders: Secondary | ICD-10-CM | POA: Insufficient documentation

## 2012-09-02 LAB — URINALYSIS, ROUTINE W REFLEX MICROSCOPIC
Glucose, UA: NEGATIVE mg/dL
Ketones, ur: NEGATIVE mg/dL
Protein, ur: NEGATIVE mg/dL
Urobilinogen, UA: 0.2 mg/dL (ref 0.0–1.0)

## 2012-09-02 LAB — URINE MICROSCOPIC-ADD ON

## 2012-09-02 LAB — WET PREP, GENITAL

## 2012-09-02 MED ORDER — SULFAMETHOXAZOLE-TRIMETHOPRIM 800-160 MG PO TABS: 1.0000 | ORAL_TABLET | Freq: Two times a day (BID) | ORAL | Status: DC

## 2012-09-02 MED ORDER — METRONIDAZOLE 500 MG PO TABS: 500.0000 mg | ORAL_TABLET | Freq: Two times a day (BID) | ORAL | Status: DC

## 2012-09-02 NOTE — ED Provider Notes (Signed)
Medical screening examination/treatment/procedure(s) were performed by non-physician practitioner and as supervising physician I was immediately available for consultation/collaboration. Devoria Albe, MD, Armando Gang   Ward Givens, MD 09/02/12 [phone]

## 2012-09-02 NOTE — ED Provider Notes (Signed)
History     CSN: [id number]  Arrival date & time 09/02/12  1557   First MD Initiated Contact with Patient 09/02/12 1629      Chief Complaint  Patient presents with  . Abscess    (Consider location/radiation/quality/duration/timing/severity/associated sxs/prior treatment) HPI Comments: Emily Morales is a 22 y.o. Female presenting with a one-week history of scattered papules on her bilateral mostly medial legs and groin area and has now also developed vaginal discharge, concerned that these 2 conditions are related.  She was seen here one week ago and was diagnosed with folliculitis and has been using Cleocin topical as prescribed.  Some of the infected sites have resolved, while others have newly popped up, most are located in her groin and perineal area.  She is currently 4 months postpartum and had a normal vaginal delivery.  She is currently on Depo-Medrol, has not had unprotected sex since her delivery, and not in the past 3 weeks.    The history is provided by the patient.    Past Medical History  Diagnosis Date  . Ovarian cyst, right   . Abnormal Pap smear   . Hx of chlamydia infection   . Headache     Past Surgical History  Procedure Date  . No past surgeries   . Colposcopy     Family History  Problem Relation Age of Onset  . Diabetes Father   . Hypertension Father   . Other Neg Hx   . Cancer Maternal Grandmother     lung  . Heart disease Paternal Grandmother     History  Substance Use Topics  . Smoking status: Never Smoker   . Smokeless tobacco: Never Used  . Alcohol Use: No    OB History    Grav Para Term Preterm Abortions TAB SAB Ect Mult Living   1 1 1  0 0 0 0 0 0 1      Review of Systems  Constitutional: Negative for fever and chills.  HENT: Negative for facial swelling.   Respiratory: Negative for shortness of breath and wheezing.   Genitourinary: Positive for vaginal discharge. Negative for vaginal bleeding, vaginal pain, menstrual problem,  pelvic pain and dyspareunia.  Skin: Positive for rash.  Neurological: Negative for numbness.    Allergies  Other  Home Medications   Current Outpatient Rx  Name  Route  Sig  Dispense  Refill  . CLINDAMYCIN PHOSPHATE 1 % EX SOLN   Topical   Apply topically 2 (two) times daily.   30 mL   0   . FERROUS SULFATE 325 (65 FE) MG PO TABS   Oral   Take 1 tablet (325 mg total) by mouth 3 (three) times daily with meals.   90 tablet   3   . HYDROCORTISONE 1 % EX CREA   Topical   Apply 1 application topically 2 (two) times daily as needed. For eczema         . IBUPROFEN 600 MG PO TABS   Oral   Take 1 tablet (600 mg total) by mouth every 6 (six) hours.   30 tablet   1   . METRONIDAZOLE 500 MG PO TABS   Oral   Take 1 tablet (500 mg total) by mouth 2 (two) times daily.   14 tablet   0   . SENNOSIDES-DOCUSATE SODIUM 8.6-50 MG PO TABS   Oral   Take 2 tablets by mouth at bedtime.   60 tablet   1   .  SULFAMETHOXAZOLE-TRIMETHOPRIM 800-160 MG PO TABS   Oral   Take 1 tablet by mouth every 12 (twelve) hours.   10 tablet   0     BP 129/67  Pulse 63  Temp 97.8 F (36.6 C) (Oral)  Resp 20  Ht 5\' 5"  (1.651 m)  Wt 187 lb 2 oz (84.879 kg)  BMI 31.14 kg/m2  SpO2 99%  LMP 08/13/2012  Breastfeeding? No  Physical Exam  Nursing note and vitals reviewed. Constitutional: She appears well-developed and well-nourished.  HENT:  Head: Normocephalic and atraumatic.  Eyes: Conjunctivae normal are normal.  Neck: Normal range of motion.  Cardiovascular: Normal rate, regular rhythm, normal heart sounds and intact distal pulses.   Pulmonary/Chest: Effort normal and breath sounds normal. She has no wheezes.  Abdominal: Soft. Bowel sounds are normal. There is no tenderness.  Genitourinary: Uterus normal. Cervix exhibits no motion tenderness, no discharge and no friability. Right adnexum displays no mass, no tenderness and no fullness. Left adnexum displays no mass, no tenderness and no  fullness. Vaginal discharge found.       Yellow to green and thick vaginal discharge.  Musculoskeletal: Normal range of motion.  Neurological: She is alert.  Skin: Skin is warm and dry. Rash noted.       Multiple scattered small pustules, mostly in perineum and groin, with a few scattered on upper medial thighs.  No surrounding cellulitis, induration or fluctuance.  Psychiatric: She has a normal mood and affect.    ED Course  Procedures (including critical care time)  Labs Reviewed  WET PREP, GENITAL - Abnormal; Notable for the following:    Clue Cells Wet Prep HPF POC MANY (*)     WBC, Wet Prep HPF POC MANY (*)     All other components within normal limits  URINALYSIS, ROUTINE W REFLEX MICROSCOPIC - Abnormal; Notable for the following:    Hgb urine dipstick TRACE (*)     Leukocytes, UA SMALL (*)     All other components within normal limits  URINE MICROSCOPIC-ADD ON - Abnormal; Notable for the following:    Bacteria, UA FEW (*)     All other components within normal limits  POCT PREGNANCY, URINE  GC/CHLAMYDIA PROBE AMP   No results found.   1. Bacterial vaginosis   2. Folliculitis       MDM  Patient prescribed Flagyl for BV and also prescribed Bactrim for folliculitis.  She was encouraged to followup with her gynecologist in one week for recheck of her symptoms.  Discussed treatment for GC and Chlamydia with cultures currently pending.  Patient considered self low risk for these infections and prefers to wait for culture results.        Burgess Amor, [place] 09/02/12 1821

## 2012-09-02 NOTE — ED Notes (Signed)
Abscess to pelvic area, painful to walk

## 2012-09-03 LAB — GC/CHLAMYDIA PROBE AMP: CT Probe RNA: NEGATIVE

## 2012-09-19 ENCOUNTER — Emergency Department (HOSPITAL_COMMUNITY)
Admission: EM | Admit: 2012-09-19 | Discharge: 2012-09-19 | Disposition: A | Discharge status: Home / Self Care | Payer: Self-pay | Attending: Emergency Medicine | Admitting: Emergency Medicine

## 2012-09-19 ENCOUNTER — Encounter (HOSPITAL_COMMUNITY): Payer: Self-pay | Admitting: Emergency Medicine

## 2012-09-19 DIAGNOSIS — R0602 Shortness of breath: Secondary | ICD-10-CM | POA: Insufficient documentation

## 2012-09-19 DIAGNOSIS — J329 Chronic sinusitis, unspecified: Secondary | ICD-10-CM | POA: Insufficient documentation

## 2012-09-19 DIAGNOSIS — M549 Dorsalgia, unspecified: Secondary | ICD-10-CM | POA: Insufficient documentation

## 2012-09-19 DIAGNOSIS — J3489 Other specified disorders of nose and nasal sinuses: Secondary | ICD-10-CM | POA: Insufficient documentation

## 2012-09-19 DIAGNOSIS — R51 Headache: Secondary | ICD-10-CM | POA: Insufficient documentation

## 2012-09-19 DIAGNOSIS — Z8619 Personal history of other infectious and parasitic diseases: Secondary | ICD-10-CM | POA: Insufficient documentation

## 2012-09-19 DIAGNOSIS — B9789 Other viral agents as the cause of diseases classified elsewhere: Secondary | ICD-10-CM

## 2012-09-19 DIAGNOSIS — Z8742 Personal history of other diseases of the female genital tract: Secondary | ICD-10-CM | POA: Insufficient documentation

## 2012-09-19 MED ORDER — FEXOFENADINE-PSEUDOEPHED ER 60-120 MG PO TB12: 1.0000 | ORAL_TABLET | Freq: Two times a day (BID) | ORAL | Status: DC | PRN

## 2012-09-19 NOTE — ED Provider Notes (Signed)
History  This chart was scribed for Emily Razor, MD by Bennett Scrape, ED Scribe. This patient was seen in room APA03/APA03 and the patient's care was started at 7:29 AM.  CSN: [id number]  Arrival date & time 09/19/12  0719   First MD Initiated Contact with Patient 09/19/12 [phone]      Chief Complaint  Patient presents with  . Cough  . Back Pain    The history is provided by the patient. No language interpreter was used.    Emily Morales is a 22 y.o. female who presents to the Emergency Department complaining of 3 days of gradual onset, gradually worsening, constant sinus pressure with associated non-productive cough, nasal congestion described as yellow, and diffuse HA. She states that she feels SOB with laying down due to the nasal congestion. She reports taking Alka seltzer with mild improvement. She also c/o of a "knot" to the crown of her head, but she denies any recent falls or hair treatments. She denies fevers, chills, sore throat, otalgia, nausea, and emesis as associated symptoms. She does not have a h/o chronic medical conditions and denies smoking and alcohol use.  Past Medical History  Diagnosis Date  . Ovarian cyst, right   . Abnormal Pap smear   . Hx of chlamydia infection   . Headache     Past Surgical History  Procedure Laterality Date  . No past surgeries    . Colposcopy      Family History  Problem Relation Age of Onset  . Diabetes Father   . Hypertension Father   . Other Neg Hx   . Cancer Maternal Grandmother     lung  . Heart disease Paternal Grandmother     History  Substance Use Topics  . Smoking status: Never Smoker   . Smokeless tobacco: Never Used  . Alcohol Use: No    OB History   Grav Para Term Preterm Abortions TAB SAB Ect Mult Living   1 1 1  0 0 0 0 0 0 1      Review of Systems  Constitutional: Negative for fever and chills.  HENT: Positive for congestion and sinus pressure. Negative for sore throat.   Respiratory: Positive  for cough. Negative for shortness of breath.   Gastrointestinal: Negative for nausea and vomiting.  All other systems reviewed and are negative.    Allergies  Other  Home Medications   Current Outpatient Rx  Name  Route  Sig  Dispense  Refill  . clindamycin (CLEOCIN-T) 1 % external solution   Topical   Apply topically 2 (two) times daily.   30 mL   0   . ferrous sulfate 325 (65 FE) MG tablet   Oral   Take 1 tablet (325 mg total) by mouth 3 (three) times daily with meals.   90 tablet   3   . hydrocortisone cream 1 %   Topical   Apply 1 application topically 2 (two) times daily as needed. For eczema         . ibuprofen (ADVIL,MOTRIN) 600 MG tablet   Oral   Take 1 tablet (600 mg total) by mouth every 6 (six) hours.   30 tablet   1   . metroNIDAZOLE (FLAGYL) 500 MG tablet   Oral   Take 1 tablet (500 mg total) by mouth 2 (two) times daily.   14 tablet   0   . senna-docusate (SENOKOT-S) 8.6-50 MG per tablet   Oral   Take 2 tablets  by mouth at bedtime.   60 tablet   1   . sulfamethoxazole-trimethoprim (SEPTRA DS) 800-160 MG per tablet   Oral   Take 1 tablet by mouth every 12 (twelve) hours.   10 tablet   0     Triage Vitals: BP 118/72  Pulse 94  Temp(Src) 98.4 F (36.9 C) (Oral)  Resp 18  SpO2 100%  LMP 08/13/2012  Physical Exam  Nursing note and vitals reviewed. Constitutional: She is oriented to person, place, and time. She appears well-developed and well-nourished. No distress.  HENT:  Head: Normocephalic and atraumatic.  Mouth/Throat: Oropharynx is clear and moist.  soft, boggy mass consistent with small hematoma at vertex of skull; non-tender, no overlaying skin changes, no sinus tenderness to percussion, pt is sniffling, handling secretions  Eyes: Conjunctivae and EOM are normal. Pupils are equal, round, and reactive to light.  Neck: Neck supple. No tracheal deviation present.  Cardiovascular: Normal rate and regular rhythm.   Pulmonary/Chest:  Effort normal and breath sounds normal. No respiratory distress.  Abdominal: Soft. There is no tenderness.  Musculoskeletal: Normal range of motion.  Lymphadenopathy:    She has no cervical adenopathy.  Neurological: She is alert and oriented to person, place, and time.  Skin: Skin is warm and dry.  Psychiatric: She has a normal mood and affect. Her behavior is normal.    ED Course  Procedures (including critical care time)  DIAGNOSTIC STUDIES: Oxygen Saturation is 100% on room air, normal by my interpretation.    COORDINATION OF CARE: 7:37 AM-Discussed treatment plan which includes decongestants and rest with pt at bedside and pt agreed to plan. Advised pt to keep good hygiene to avoid spreading to family members   Labs Reviewed - No data to display No results found.   1. Viral sinusitis       MDM  21yf with likely viral illness. Well appearing. Very low suspicion for SBI. Plan symptomatic tx.    I personally preformed the services scribed in my presence. The recorded information has been reviewed is accurate. Emily Razor, MD.      Emily Razor, MD 09/23/12 [phone]

## 2012-09-19 NOTE — ED Notes (Signed)
Pt c/o "sinus congestion', runny nose and nonprod cough x 3 days. C/o lower back pain x 3 days, worse with movement. Denies gu sx's. Nad. No resp distress.

## 2012-11-11 ENCOUNTER — Encounter: Payer: Self-pay | Admitting: Obstetrics & Gynecology

## 2012-11-11 ENCOUNTER — Ambulatory Visit (INDEPENDENT_AMBULATORY_CARE_PROVIDER_SITE_OTHER): Payer: Medicaid Other | Admitting: Obstetrics & Gynecology

## 2012-11-11 VITALS — BP 124/70 | Wt 177.2 lb

## 2012-11-11 DIAGNOSIS — Z3049 Encounter for surveillance of other contraceptives: Secondary | ICD-10-CM

## 2012-11-11 DIAGNOSIS — IMO0001 Reserved for inherently not codable concepts without codable children: Secondary | ICD-10-CM

## 2012-11-11 LAB — POCT URINE PREGNANCY: Preg Test, Ur: NEGATIVE

## 2012-11-11 MED ORDER — MEDROXYPROGESTERONE ACETATE 150 MG/ML IM SUSP
150.0000 mg | Freq: Once | INTRAMUSCULAR | Status: AC
  Administered 2012-11-11: 150 mg via INTRAMUSCULAR

## 2013-02-03 ENCOUNTER — Ambulatory Visit (INDEPENDENT_AMBULATORY_CARE_PROVIDER_SITE_OTHER): Payer: Medicaid Other | Admitting: Adult Health

## 2013-02-03 ENCOUNTER — Encounter: Payer: Self-pay | Admitting: Adult Health

## 2013-02-03 VITALS — BP 120/76 | Ht 65.0 in | Wt 194.0 lb

## 2013-02-03 DIAGNOSIS — Z309 Encounter for contraceptive management, unspecified: Secondary | ICD-10-CM

## 2013-02-03 DIAGNOSIS — Z3049 Encounter for surveillance of other contraceptives: Secondary | ICD-10-CM

## 2013-02-03 DIAGNOSIS — Z3200 Encounter for pregnancy test, result unknown: Secondary | ICD-10-CM

## 2013-02-03 LAB — POCT URINE PREGNANCY: Preg Test, Ur: NEGATIVE

## 2013-02-03 MED ORDER — MEDROXYPROGESTERONE ACETATE 150 MG/ML IM SUSP
150.0000 mg | Freq: Once | INTRAMUSCULAR | Status: AC
  Administered 2013-02-03: 150 mg via INTRAMUSCULAR

## 2013-02-25 ENCOUNTER — Emergency Department (HOSPITAL_COMMUNITY)
Admission: EM | Admit: 2013-02-25 | Discharge: 2013-02-25 | Disposition: A | Discharge status: Home / Self Care | Payer: Self-pay | Attending: Emergency Medicine | Admitting: Emergency Medicine

## 2013-02-25 ENCOUNTER — Encounter (HOSPITAL_COMMUNITY): Payer: Self-pay | Admitting: Emergency Medicine

## 2013-02-25 DIAGNOSIS — Z8619 Personal history of other infectious and parasitic diseases: Secondary | ICD-10-CM | POA: Insufficient documentation

## 2013-02-25 DIAGNOSIS — Z8742 Personal history of other diseases of the female genital tract: Secondary | ICD-10-CM | POA: Insufficient documentation

## 2013-02-25 DIAGNOSIS — R498 Other voice and resonance disorders: Secondary | ICD-10-CM | POA: Insufficient documentation

## 2013-02-25 DIAGNOSIS — J039 Acute tonsillitis, unspecified: Secondary | ICD-10-CM | POA: Insufficient documentation

## 2013-02-25 DIAGNOSIS — J3489 Other specified disorders of nose and nasal sinuses: Secondary | ICD-10-CM | POA: Insufficient documentation

## 2013-02-25 DIAGNOSIS — R0989 Other specified symptoms and signs involving the circulatory and respiratory systems: Secondary | ICD-10-CM | POA: Insufficient documentation

## 2013-02-25 DIAGNOSIS — R599 Enlarged lymph nodes, unspecified: Secondary | ICD-10-CM | POA: Insufficient documentation

## 2013-02-25 LAB — RAPID STREP SCREEN (MED CTR MEBANE ONLY): Streptococcus, Group A Screen (Direct): NEGATIVE

## 2013-02-25 MED ORDER — AMOXICILLIN 500 MG PO CAPS: 500.0000 mg | ORAL_CAPSULE | Freq: Three times a day (TID) | ORAL | Status: DC

## 2013-02-25 MED ORDER — MAGIC MOUTHWASH W/LIDOCAINE: ORAL | Status: DC

## 2013-02-25 NOTE — Discharge Instructions (Signed)
Tonsillitis  Tonsils are lumps of tissue at the back of the throat. Tonsillitis is an infection of the throat. This infection causes the tonsils to become red, tender, and puffy (swollen). If germs (bacteria) caused the infection, an antibiotic medicine will be given to you. If your tonsillitis is severe and happens often, you may need to get your tonsils removed (tonsillectomy).  HOME CARE    Rest and sleep often.   Drink enough fluids to keep your pee (urine) clear or pale yellow.   While your throat is sore, eat soft or liquid foods like:   Soup.   Ice cream.   Instant breakfast drinks.   Eat frozen ice pops.   Gargle with a warm or cold liquid to help soothe the throat. Gargle with a water and salt mix. Mix 1 teaspoon of salt in 1 cup of water.   Only take medicines as told by your doctor.   If you are given medicines (antibiotics), take them as told. Finish them even if you start to feel better.  GET HELP RIGHT AWAY IF:    You throw up (vomit).   You have a very bad headache.   You have a stiff neck.   You have chest pain.   You have trouble breathing or swallowing.   You have bad throat pain, drooling, or your voice changes.   You have bad pain not helped by medicine.   You cannot fully open your mouth.   You have redness, puffiness, or bad pain in the neck.   You have a fever.   The patient is a child younger than 3 months and has a fever.   The patient is a child older than 3 months and has a fever or problems that do not go away.   The patient is a child older than 3 months, has a fever, and his or her problems get worse.   You have large, tender lumps on your neck.   You have a rash.   You cough up green, yellow-brown, or bloody fluid.   You cannot swallow liquids or food for 24 hours.   The patient is a child and cannot swallow liquids or food for 12 hours.  MAKE SURE YOU:    Understand these instructions.   Will watch your condition.   Will get help right away if you are  not doing well or get worse.  Document Released: 01/03/2008 Document Revised: 10/09/2011 Document Reviewed: 09/22/2010  [employer] Patient Information 2014 [employer].

## 2013-02-25 NOTE — ED Notes (Addendum)
Pt reports sore throat and not having a voice x 1 week. Pt denies and fever. Pt alert and oriented. nad noted. Airway patent. Pt is able to swallow but reports pain with swallowing.

## 2013-02-25 NOTE — ED Provider Notes (Signed)
CSN: [id number]     Arrival date & time 02/25/13  1228 History     First MD Initiated Contact with Patient 02/25/13 1248     Chief Complaint  Patient presents with  . Sore Throat   (Consider location/radiation/quality/duration/timing/severity/associated sxs/prior Treatment) Patient is a 22 y.o. female presenting with pharyngitis. The history is provided by the patient.  Sore Throat This is a new problem. The current episode started 1 to 4 weeks ago. The problem occurs constantly. The problem has been gradually worsening. Associated symptoms include congestion, a sore throat and swollen glands. Pertinent negatives include no abdominal pain, anorexia, arthralgias, change in bowel habit, chest pain, chills, coughing, fever, headaches, nausea, neck pain, numbness, rash, urinary symptoms, vertigo, visual change, vomiting or weakness. The symptoms are aggravated by swallowing. Treatments tried: OTC throat spray. The treatment provided no relief.    Past Medical History  Diagnosis Date  . Ovarian cyst, right   . Abnormal Pap smear   . Hx of chlamydia infection   . WUXLKGMW(102.7)    Past Surgical History  Procedure Laterality Date  . No past surgeries    . Colposcopy     Family History  Problem Relation Age of Onset  . Diabetes Father   . Hypertension Father   . Cancer Maternal Grandmother     lung  . Heart disease Paternal Grandmother   . Other Son     reactive airway disease   History  Substance Use Topics  . Smoking status: Never Smoker   . Smokeless tobacco: Never Used  . Alcohol Use: Yes     Comment: occ   OB History   Grav Para Term Preterm Abortions TAB SAB Ect Mult Living   1 1 1  0 0 0 0 0 0 1     Review of Systems  Constitutional: Negative for fever, chills, activity change and appetite change.  HENT: Positive for congestion, sore throat and voice change. Negative for ear pain, facial swelling, mouth sores, trouble swallowing, neck pain, neck stiffness and dental  problem.   Eyes: Negative for pain and visual disturbance.  Respiratory: Negative for cough and shortness of breath.   Cardiovascular: Negative for chest pain.  Gastrointestinal: Negative for nausea, vomiting, abdominal pain, anorexia and change in bowel habit.  Musculoskeletal: Negative for arthralgias.  Skin: Negative for color change and rash.  Neurological: Negative for dizziness, vertigo, facial asymmetry, speech difficulty, weakness, numbness and headaches.  Hematological: Negative for adenopathy.  All other systems reviewed and are negative.    Allergies  Other and Pollen extract  Home Medications   Current Outpatient Rx  Name  Route  Sig  Dispense  Refill  . phenol (CHLORASEPTIC) 1.4 % LIQD   Mouth/Throat   Use as directed 1 spray in the mouth or throat as needed (sore throat).         . Phenyleph-CPM-DM-Aspirin (ALKA-SELTZER PLUS COLD & COUGH PO)   Oral   Take 2 capsules by mouth every 4 (four) hours as needed (sore throat).          BP 105/60  Pulse 93  Temp(Src) 98.6 F (37 C) (Oral)  Resp 20  Ht 5\' 5"  (1.651 m)  Wt 194 lb (87.998 kg)  BMI 32.28 kg/m2  SpO2 100%  Breastfeeding? No Physical Exam  Nursing note and vitals reviewed. Constitutional: She is oriented to person, place, and time. She appears well-developed and well-nourished. No distress.  HENT:  Head: Normocephalic and atraumatic. No trismus in the jaw.  Right Ear: Tympanic membrane and ear canal normal.  Left Ear: Tympanic membrane and ear canal normal.  Mouth/Throat: Uvula is midline and mucous membranes are normal. No edematous. Oropharyngeal exudate, posterior oropharyngeal edema and posterior oropharyngeal erythema present. No tonsillar abscesses.  Patient has hoarse voice, no "hot potato" voice.  No PTA.  Airway patent  Neck: Normal range of motion. Neck supple.  Cardiovascular: Normal rate, regular rhythm, normal heart sounds and intact distal pulses.   No murmur  heard. Pulmonary/Chest: Effort normal and breath sounds normal. No respiratory distress.  Musculoskeletal: Normal range of motion.  Lymphadenopathy:    She has no cervical adenopathy.  Neurological: She is alert and oriented to person, place, and time. She exhibits normal muscle tone. Coordination normal.  Skin: Skin is warm and dry.    ED Course   Procedures (including critical care time)  Results for orders placed during the hospital encounter of 02/25/13  RAPID STREP SCREEN      Result Value Range   Streptococcus, Group A Screen (Direct) NEGATIVE  NEGATIVE      MDM    Airway patent, uvula is midline.  No PTA at this time.  Likely tonsillitis.  Agrees to f/u with her PMD, rest, fluids, i will prescribe amoxil and magic mouthwash.  Pt is non-toxic appearing and stable for d/c  Avamarie Crossley L. Harvest Deist, PA-C 02/26/13 2200

## 2013-03-02 NOTE — ED Provider Notes (Signed)
Medical screening examination/treatment/procedure(s) were performed by non-physician practitioner and as supervising physician I was immediately available for consultation/collaboration.   Ashby Dawes, MD 03/02/13 [phone]

## 2013-04-16 ENCOUNTER — Encounter (HOSPITAL_COMMUNITY): Payer: Self-pay

## 2013-04-16 ENCOUNTER — Emergency Department (HOSPITAL_COMMUNITY)
Admission: EM | Admit: 2013-04-16 | Discharge: 2013-04-16 | Disposition: A | Discharge status: Home / Self Care | Payer: Medicaid Other | Attending: Emergency Medicine | Admitting: Emergency Medicine

## 2013-04-16 DIAGNOSIS — R112 Nausea with vomiting, unspecified: Secondary | ICD-10-CM | POA: Insufficient documentation

## 2013-04-16 DIAGNOSIS — Z8742 Personal history of other diseases of the female genital tract: Secondary | ICD-10-CM | POA: Insufficient documentation

## 2013-04-16 DIAGNOSIS — H538 Other visual disturbances: Secondary | ICD-10-CM | POA: Insufficient documentation

## 2013-04-16 DIAGNOSIS — Z8619 Personal history of other infectious and parasitic diseases: Secondary | ICD-10-CM | POA: Insufficient documentation

## 2013-04-16 DIAGNOSIS — R519 Headache, unspecified: Secondary | ICD-10-CM

## 2013-04-16 DIAGNOSIS — Z3202 Encounter for pregnancy test, result negative: Secondary | ICD-10-CM | POA: Insufficient documentation

## 2013-04-16 DIAGNOSIS — R109 Unspecified abdominal pain: Secondary | ICD-10-CM | POA: Insufficient documentation

## 2013-04-16 DIAGNOSIS — Z91040 Latex allergy status: Secondary | ICD-10-CM | POA: Insufficient documentation

## 2013-04-16 DIAGNOSIS — H53149 Visual discomfort, unspecified: Secondary | ICD-10-CM | POA: Insufficient documentation

## 2013-04-16 DIAGNOSIS — R51 Headache: Secondary | ICD-10-CM | POA: Insufficient documentation

## 2013-04-16 MED ORDER — METOCLOPRAMIDE HCL 10 MG PO TABS
10.0000 mg | ORAL_TABLET | Freq: Once | ORAL | Status: AC
  Administered 2013-04-16: 10 mg via ORAL
  Filled 2013-04-16: qty 1

## 2013-04-16 MED ORDER — DIPHENHYDRAMINE HCL 25 MG PO CAPS
25.0000 mg | ORAL_CAPSULE | Freq: Once | ORAL | Status: AC
  Administered 2013-04-16: 25 mg via ORAL
  Filled 2013-04-16: qty 1

## 2013-04-16 NOTE — ED Provider Notes (Signed)
CSN: [id number]     Arrival date & time 04/16/13  4098 History  This chart was scribed for Emily Churn, MD by Quintella Reichert, ED scribe.  This patient was seen in room APA14/APA14 and the patient's care was started at 11:39 AM.   Chief Complaint  Patient presents with  . Emesis  . Headache     Patient is a 22 y.o. female presenting with headaches. The history is provided by the patient. No language interpreter was used.  Headache Location: right frontal. Radiates to:  Does not radiate Pain severity now: Moderate. Onset quality:  Gradual Duration:  1 day Timing:  Constant Progression:  Partially resolved Chronicity:  Recurrent Similar to prior headaches: yes   Relieved by:  Nothing Worsened by:  Light Ineffective treatments:  NSAIDs Associated symptoms: abdominal pain, blurred vision (transient), nausea, photophobia and vomiting (one time)   Associated symptoms: no focal weakness, no loss of balance, no numbness and no paresthesias   Abdominal pain:    Quality:  Cramping   Severity:  Mild   Timing:  Constant   HPI Comments: Emily Morales is a 22 y.o. female who presents to the Emergency Department complaining of a constant moderate right frontal headache that began last night with associated emesis.  Headache is gradually improving but pt came to the ED because she vomited one time at work this morning.  It is exacerbated by bright lights.  She took ibuprofen, without relief.  She also states that her vision became blurry while she was sitting at her computer at work today.  Presently she denies blurred vision.  She also complains of some mild abdominal cramping.  She denies dysuria, frequency, diarrhea, or any other associated symptoms..  She notes that she has headaches frequently and her present headache is similar.  However she does not typically vomit in association with headaches.  Pt is on Depo and LNMP was 2 months ago.  Pt also notes a "knot" to the back of her  head that has been present for several months and began to grow "soft" 2-3 months ago.  She denies injury to her knowledge.  Pt has been to the ED for the same before and has been told the area was "just a knot" and not concerning.  Pt has no PCP   Past Medical History  Diagnosis Date  . Ovarian cyst, right   . Abnormal Pap smear   . Hx of chlamydia infection   . JXBJYNWG(956.2)     Past Surgical History  Procedure Laterality Date  . No past surgeries    . Colposcopy      Family History  Problem Relation Age of Onset  . Diabetes Father   . Hypertension Father   . Cancer Maternal Grandmother     lung  . Heart disease Paternal Grandmother   . Other Son     reactive airway disease    History  Substance Use Topics  . Smoking status: Never Smoker   . Smokeless tobacco: Never Used  . Alcohol Use: Yes     Comment: occ    OB History   Grav Para Term Preterm Abortions TAB SAB Ect Mult Living   1 1 1  0 0 0 0 0 0 1      Review of Systems  HENT:       "knot" on back of head  Eyes: Positive for blurred vision (transient) and photophobia.  Gastrointestinal: Positive for nausea, vomiting (one time) and abdominal  pain.  Neurological: Positive for headaches. Negative for focal weakness, numbness, paresthesias and loss of balance.  All other systems reviewed and are negative.     Allergies  Latex; Other; and Pollen extract  Home Medications  No current outpatient prescriptions on file.  BP 109/73  Pulse 69  Temp(Src) 98.8 F (37.1 C) (Oral)  Resp 18  Ht 5\' 5"  (1.651 m)  Wt 197 lb 3 oz (89.444 kg)  BMI 32.81 kg/m2  SpO2 100%  Physical Exam  Nursing note and vitals reviewed. Constitutional: She is oriented to person, place, and time. She appears well-developed and well-nourished. No distress.  HENT:  Head: Normocephalic and atraumatic.    Mouth/Throat: Oropharynx is clear and moist.  Eyes: Conjunctivae are normal. Pupils are equal, round, and reactive to  light. No scleral icterus.  Fundoscopic exam:      The right eye shows no papilledema.       The left eye shows no papilledema.  Neck: Neck supple.  Cardiovascular: Normal rate, regular rhythm, normal heart sounds and intact distal pulses.   No murmur heard. Pulmonary/Chest: Effort normal and breath sounds normal. No stridor. No respiratory distress. She has no rales.  Abdominal: Soft. Bowel sounds are normal. She exhibits no distension. There is no tenderness. There is no rigidity, no rebound and no guarding.  Musculoskeletal: Normal range of motion.  Neurological: She is alert and oriented to person, place, and time. She has normal strength. No cranial nerve deficit or sensory deficit. Coordination normal. GCS eye subscore is 4. GCS verbal subscore is 5. GCS motor subscore is 6.  Skin: Skin is warm and dry. No rash noted.  Psychiatric: She has a normal mood and affect. Her behavior is normal.    ED Course  Procedures (including critical care time)  DIAGNOSTIC STUDIES: Oxygen Saturation is 100% on room air, normal by my interpretation.    COORDINATION OF CARE: 11:48 AM: Discussed treatment plan which includes, pain medication, anti-emetics, and Upt.  Pt expressed understanding and agreed to plan.   Labs Review Labs Reviewed  POCT PREGNANCY, URINE    Imaging Review No results found.  MDM   1. Headache    22 year old female presenting with headache, since last night. Associated with one episode of vomiting. Similar to prior headaches. Well-appearing, normal vitals. No fevers. Normal neurologic exam. No papilledema. No meningismus. Story and exam are not consistent with subarachnoid hemorrhage.  She has an area to the back of her head which she says he used to be "a knot" which is now "soft". There is possibly a small amount of edema in this area, but no signs of infection or trauma. Do not think he needs further emergent evaluation. Her headache resolved after by mouth Reglan and  Benadryl. She will follow up with neurology for further evaluation.    I personally performed the services described in this documentation, which was scribed in my presence. The recorded information has been reviewed and is accurate.     Emily Churn, MD 04/16/13 1536

## 2013-04-16 NOTE — ED Notes (Signed)
Pt reports headache since last night, and vomiting x1 today, was sent home from work, no diarrhea or fever. Also has had a "knot" to back of her head for months and would like to have it checked. Out.

## 2013-04-28 ENCOUNTER — Ambulatory Visit: Payer: Medicaid Other

## 2013-05-09 ENCOUNTER — Emergency Department (HOSPITAL_COMMUNITY)
Admission: EM | Admit: 2013-05-09 | Discharge: 2013-05-09 | Disposition: A | Discharge status: Home / Self Care | Payer: Medicaid Other | Attending: Emergency Medicine | Admitting: Emergency Medicine

## 2013-05-09 ENCOUNTER — Encounter (HOSPITAL_COMMUNITY): Payer: Self-pay | Admitting: Emergency Medicine

## 2013-05-09 DIAGNOSIS — J069 Acute upper respiratory infection, unspecified: Secondary | ICD-10-CM

## 2013-05-09 DIAGNOSIS — R51 Headache: Secondary | ICD-10-CM | POA: Insufficient documentation

## 2013-05-09 DIAGNOSIS — Z8619 Personal history of other infectious and parasitic diseases: Secondary | ICD-10-CM | POA: Insufficient documentation

## 2013-05-09 DIAGNOSIS — Z8742 Personal history of other diseases of the female genital tract: Secondary | ICD-10-CM | POA: Insufficient documentation

## 2013-05-09 DIAGNOSIS — Z91040 Latex allergy status: Secondary | ICD-10-CM | POA: Insufficient documentation

## 2013-05-09 MED ORDER — PSEUDOEPHEDRINE HCL 60 MG PO TABS: 60.0000 mg | ORAL_TABLET | Freq: Four times a day (QID) | ORAL | Status: DC | PRN

## 2013-05-09 MED ORDER — PROMETHAZINE-CODEINE 6.25-10 MG/5ML PO SYRP: 5.0000 mL | ORAL_SOLUTION | ORAL | Status: DC | PRN

## 2013-05-09 NOTE — ED Notes (Signed)
Pt reports nasal congestion for 2 days, headache since last night, has not tried any otc meds for relief. No cough or fever, no nausea or vomiting.

## 2013-05-09 NOTE — ED Notes (Signed)
Patient with no complaints at this time. Respirations even and unlabored. Skin warm/dry. Discharge instructions reviewed with patient at this time. Patient given opportunity to voice concerns/ask questions. Patient discharged at this time and left Emergency Department with steady gait.   

## 2013-05-09 NOTE — ED Provider Notes (Signed)
CSN: [id number]     Arrival date & time 05/09/13  0919 History   First MD Initiated Contact with Patient 05/09/13 1000     Chief Complaint  Patient presents with  . Nasal Congestion  . Headache   (Consider location/radiation/quality/duration/timing/severity/associated sxs/prior Treatment) HPI Comments: Emily Morales is a 22 y.o. Female presenting with a 2 day history of uri type symptoms which includes nasal congestion with clear rhinorrhea, headache and nonproductive cough.  Symptoms due to not include ear pain, fever, sore throat, shortness of breath, chest pain,  Nausea, vomiting or diarrhea.  The patient has tried no medicines or treatment prior to arrival.  The history is provided by the patient.    Past Medical History  Diagnosis Date  . Ovarian cyst, right   . Abnormal Pap smear   . Hx of chlamydia infection   . [id number](478.2)    Past Surgical History  Procedure Laterality Date  . No past surgeries    . Colposcopy     Family History  Problem Relation Age of Onset  . Diabetes Father   . Hypertension Father   . Cancer Maternal Grandmother     lung  . Heart disease Paternal Grandmother   . Other Son     reactive airway disease   History  Substance Use Topics  . Smoking status: Never Smoker   . Smokeless tobacco: Never Used  . Alcohol Use: Yes     Comment: occ   OB History   Grav Para Term Preterm Abortions TAB SAB Ect Mult Living   1 1 1  0 0 0 0 0 0 1     Review of Systems  Constitutional: Negative for fever, chills, diaphoresis and appetite change.  HENT: Positive for congestion and rhinorrhea. Negative for ear pain, sinus pressure, sneezing, sore throat and trouble swallowing.   Eyes: Negative for redness.  Respiratory: Positive for cough. Negative for shortness of breath and wheezing.   Cardiovascular: Negative for chest pain.  Gastrointestinal: Negative for nausea, vomiting and abdominal pain.  Genitourinary: Negative.   Musculoskeletal: Negative  for arthralgias.  Skin: Negative for rash.  Neurological: Positive for headaches.    Allergies  Latex; Other; and Pollen extract  Home Medications   Current Outpatient Rx  Name  Route  Sig  Dispense  Refill  . promethazine-codeine (PHENERGAN WITH CODEINE) 6.25-10 MG/5ML syrup   Oral   Take 5 mLs by mouth every 4 (four) hours as needed for cough.   120 mL   0   . pseudoephedrine (SUDAFED) 60 MG tablet   Oral   Take 1 tablet (60 mg total) by mouth every 6 (six) hours as needed for congestion.   30 tablet   0    BP 115/76  Pulse 63  Temp(Src) 98.4 F (36.9 C) (Oral)  Resp 18  Ht 5\' 5"  (1.651 m)  Wt 197 lb (89.359 kg)  BMI 32.78 kg/m2  SpO2 100%  LMP 05/05/2013 Physical Exam  Constitutional: She is oriented to person, place, and time. She appears well-developed and well-nourished.  HENT:  Head: Normocephalic and atraumatic.  Right Ear: Tympanic membrane and ear canal normal.  Left Ear: Tympanic membrane and ear canal normal.  Nose: Mucosal edema and rhinorrhea present.  Mouth/Throat: Uvula is midline, oropharynx is clear and moist and mucous membranes are normal. No oropharyngeal exudate, posterior oropharyngeal edema, posterior oropharyngeal erythema or tonsillar abscesses.  Eyes: Conjunctivae are normal.  Cardiovascular: Normal rate and normal heart sounds.   Pulmonary/Chest: Effort  normal. No respiratory distress. She has no decreased breath sounds. She has no wheezes. She has no rhonchi. She has no rales.  Abdominal: Soft. There is no tenderness.  Musculoskeletal: Normal range of motion.  Neurological: She is alert and oriented to person, place, and time.  Skin: Skin is warm and dry. No rash noted.  Psychiatric: She has a normal mood and affect.    ED Course  Procedures (including critical care time) Labs Review Labs Reviewed - No data to display Imaging Review No results found.  EKG Interpretation   None       MDM   1. Acute URI    Pt prescribed  phenergan/codeine, sudafed.  Encouraged rest, increased fluid intake.    Pt is not currently pregnant.  Burgess Amor, PA-C 05/12/13 1157

## 2013-05-16 NOTE — ED Provider Notes (Signed)
Medical screening examination/treatment/procedure(s) were performed by non-physician practitioner and as supervising physician I was immediately available for consultation/collaboration.    Celene Kras, MD 05/16/13 [phone]

## 2013-07-03 ENCOUNTER — Encounter (HOSPITAL_COMMUNITY): Payer: Self-pay | Admitting: Emergency Medicine

## 2013-07-03 ENCOUNTER — Emergency Department (HOSPITAL_COMMUNITY)
Admission: EM | Admit: 2013-07-03 | Discharge: 2013-07-03 | Disposition: A | Discharge status: Home / Self Care | Payer: Medicaid Other | Attending: Emergency Medicine | Admitting: Emergency Medicine

## 2013-07-03 DIAGNOSIS — S335XXA Sprain of ligaments of lumbar spine, initial encounter: Secondary | ICD-10-CM | POA: Insufficient documentation

## 2013-07-03 DIAGNOSIS — S39012A Strain of muscle, fascia and tendon of lower back, initial encounter: Secondary | ICD-10-CM

## 2013-07-03 DIAGNOSIS — X58XXXA Exposure to other specified factors, initial encounter: Secondary | ICD-10-CM | POA: Insufficient documentation

## 2013-07-03 DIAGNOSIS — Z3202 Encounter for pregnancy test, result negative: Secondary | ICD-10-CM | POA: Insufficient documentation

## 2013-07-03 DIAGNOSIS — Y929 Unspecified place or not applicable: Secondary | ICD-10-CM | POA: Insufficient documentation

## 2013-07-03 DIAGNOSIS — Z8619 Personal history of other infectious and parasitic diseases: Secondary | ICD-10-CM | POA: Insufficient documentation

## 2013-07-03 DIAGNOSIS — Z91040 Latex allergy status: Secondary | ICD-10-CM | POA: Insufficient documentation

## 2013-07-03 DIAGNOSIS — Y939 Activity, unspecified: Secondary | ICD-10-CM | POA: Insufficient documentation

## 2013-07-03 DIAGNOSIS — Z8742 Personal history of other diseases of the female genital tract: Secondary | ICD-10-CM | POA: Insufficient documentation

## 2013-07-03 LAB — URINALYSIS, ROUTINE W REFLEX MICROSCOPIC
Glucose, UA: NEGATIVE mg/dL
Specific Gravity, Urine: 1.025 (ref 1.005–1.030)

## 2013-07-03 LAB — URINE MICROSCOPIC-ADD ON

## 2013-07-03 LAB — POCT PREGNANCY, URINE: Preg Test, Ur: NEGATIVE

## 2013-07-03 MED ORDER — DICLOFENAC SODIUM 75 MG PO TBEC: 75.0000 mg | DELAYED_RELEASE_TABLET | Freq: Two times a day (BID) | ORAL | Status: DC

## 2013-07-03 MED ORDER — CYCLOBENZAPRINE HCL 10 MG PO TABS: 10.0000 mg | ORAL_TABLET | Freq: Three times a day (TID) | ORAL | Status: DC | PRN

## 2013-07-03 NOTE — ED Provider Notes (Signed)
Medical screening examination/treatment/procedure(s) were performed by non-physician practitioner and as supervising physician I was immediately available for consultation/collaboration.  EKG Interpretation   None        Shon Baton, MD 07/03/13 [phone]

## 2013-07-03 NOTE — ED Notes (Signed)
Back pain times 3 weeks.  Denies any injury. Pain radiates to both hips.

## 2013-07-03 NOTE — ED Provider Notes (Signed)
CSN: [id number]     Arrival date & time 07/03/13  1191 History   First MD Initiated Contact with Patient 07/03/13 0913     Chief Complaint  Patient presents with  . Back Pain   (Consider location/radiation/quality/duration/timing/severity/associated sxs/prior Treatment) Patient is a 22 y.o. female presenting with back pain. The history is provided by the patient.  Back Pain Location:  Lumbar spine Quality:  Aching Radiates to: pain radiates to both hips, buttocks.  Left > right. Pain severity:  Moderate Pain is:  Same all the time Onset quality:  Gradual Duration:  2 weeks Timing:  Constant Progression:  Unchanged Chronicity:  New Context: lifting heavy objects and twisting   Context: not falling, not recent illness and not recent injury   Relieved by:  Nothing Worsened by:  Ambulation, movement, twisting and bending Ineffective treatments:  NSAIDs Associated symptoms: no abdominal pain, no abdominal swelling, no bladder incontinence, no bowel incontinence, no chest pain, no dysuria, no fever, no headaches, no leg pain, no numbness, no paresthesias, no pelvic pain, no perianal numbness, no tingling and no weakness     Past Medical History  Diagnosis Date  . Ovarian cyst, right   . Abnormal Pap smear   . Hx of chlamydia infection   . YNWGNFAO(130.8)    Past Surgical History  Procedure Laterality Date  . No past surgeries    . Colposcopy     Family History  Problem Relation Age of Onset  . Diabetes Father   . Hypertension Father   . Cancer Maternal Grandmother     lung  . Heart disease Paternal Grandmother   . Other Son     reactive airway disease   History  Substance Use Topics  . Smoking status: Never Smoker   . Smokeless tobacco: Never Used  . Alcohol Use: Yes     Comment: occ   OB History   Grav Para Term Preterm Abortions TAB SAB Ect Mult Living   1 1 1  0 0 0 0 0 0 1     Review of Systems  Constitutional: Negative for fever.  Respiratory: Negative  for chest tightness and shortness of breath.   Cardiovascular: Negative for chest pain.  Gastrointestinal: Negative for vomiting, abdominal pain, constipation, abdominal distention and bowel incontinence.  Genitourinary: Negative for bladder incontinence, dysuria, hematuria, flank pain, decreased urine volume, vaginal discharge, difficulty urinating, vaginal pain and pelvic pain.       No perineal numbness or incontinence of urine or feces  Musculoskeletal: Positive for back pain. Negative for joint swelling.  Skin: Negative for rash.  Neurological: Negative for tingling, weakness, numbness, headaches and paresthesias.  All other systems reviewed and are negative.    Allergies  Latex; Other; and Pollen extract  Home Medications   Current Outpatient Rx  Name  Route  Sig  Dispense  Refill  . ibuprofen (ADVIL,MOTRIN) 200 MG tablet   Oral   Take 800 mg by mouth every 6 (six) hours as needed for moderate pain.          BP 91/71  Pulse 63  Temp(Src) 98.3 F (36.8 C) (Oral)  SpO2 98%  LMP 06/29/2013 Physical Exam  Nursing note and vitals reviewed. Constitutional: She is oriented to person, place, and time. She appears well-developed and well-nourished. No distress.  HENT:  Head: Normocephalic and atraumatic.  Neck: Normal range of motion. Neck supple.  Cardiovascular: Normal rate, regular rhythm, normal heart sounds and intact distal pulses.   No murmur heard.  Pulmonary/Chest: Effort normal and breath sounds normal. No respiratory distress.  Abdominal: Soft. She exhibits no distension. There is no tenderness.  Musculoskeletal: She exhibits tenderness. She exhibits no edema.       Lumbar back: She exhibits tenderness and pain. She exhibits normal range of motion, no swelling, no deformity, no laceration and normal pulse.       Back:  Diffuse ttp of the lumbar spine and paraspinal muscles.   DP pulses are brisk and symmetrical.  Distal sensation intact.  Hip Flexors/Extensors are  intact.  Pain reproduced with SLR bilaterally, left > right  Neurological: She is alert and oriented to person, place, and time. She has normal strength. No sensory deficit. She exhibits normal muscle tone. Coordination and gait normal.  Reflex Scores:      Patellar reflexes are 2+ on the right side and 2+ on the left side.      Achilles reflexes are 2+ on the right side and 2+ on the left side. Skin: Skin is warm and dry. No rash noted.    ED Course  Procedures (including critical care time) Labs Review Labs Reviewed  URINALYSIS, ROUTINE W REFLEX MICROSCOPIC - Abnormal; Notable for the following:    Hgb urine dipstick MODERATE (*)    All other components within normal limits  URINE MICROSCOPIC-ADD ON  POCT PREGNANCY, URINE   Imaging Review No results found.  EKG Interpretation   None       MDM    Patient has ttp of the lumbar paraspinal muscles.  No focal neuro deficits on exam.  Ambulates with a steady gait.  No concerning sx's for emergent neurological or infectious process.   Patient here with her son when she decided to check in for evaluation.    Pt agrees to symptomatic treatment with flexeril and diclofenac.  Pt appears stable for discharge.     Carin Shipp L. Romey Mathieson, PA-C 07/03/13 1053

## 2013-11-21 ENCOUNTER — Emergency Department (HOSPITAL_COMMUNITY)
Admission: EM | Admit: 2013-11-21 | Discharge: 2013-11-21 | Discharge status: Against Medical Advice | Payer: Self-pay | Attending: Emergency Medicine | Admitting: Emergency Medicine

## 2013-11-21 ENCOUNTER — Encounter (HOSPITAL_COMMUNITY): Payer: Self-pay | Admitting: Emergency Medicine

## 2013-11-21 DIAGNOSIS — R109 Unspecified abdominal pain: Secondary | ICD-10-CM | POA: Insufficient documentation

## 2013-11-21 HISTORY — DX: Leiomyoma of uterus, unspecified: D25.9

## 2013-11-21 LAB — URINALYSIS, ROUTINE W REFLEX MICROSCOPIC
BILIRUBIN URINE: NEGATIVE
Glucose, UA: NEGATIVE mg/dL
Ketones, ur: NEGATIVE mg/dL
LEUKOCYTES UA: NEGATIVE
NITRITE: NEGATIVE
Protein, ur: NEGATIVE mg/dL
SPECIFIC GRAVITY, URINE: 1.025 (ref 1.005–1.030)
UROBILINOGEN UA: 0.2 mg/dL (ref 0.0–1.0)
pH: 6 (ref 5.0–8.0)

## 2013-11-21 LAB — PREGNANCY, URINE: PREG TEST UR: NEGATIVE

## 2013-11-21 LAB — URINE MICROSCOPIC-ADD ON

## 2013-11-21 NOTE — ED Notes (Signed)
Bilateral lower quadrant pain radiating into mid pelvis.  States last night it felt as if it was tightening.  Took pregnancy test 2 weeks ago which was negative.  LMP ended 10/30/13.  Denies n/v/d and fevers.  Denies dysuria, dyspareunia, pain on defication.  LBM 2 days ago which is normal for her.

## 2013-11-21 NOTE — ED Notes (Signed)
Patient taken to back, but was pre-empted by EMS.  She stated, "I'm just going to go home, this is ridiculous."  Patient left without being seen.

## 2013-11-21 NOTE — ED Notes (Signed)
Pt upset about wait time and walked out without being seen.

## 2013-11-22 ENCOUNTER — Encounter (HOSPITAL_COMMUNITY): Payer: Self-pay | Admitting: Emergency Medicine

## 2013-11-22 ENCOUNTER — Emergency Department (HOSPITAL_COMMUNITY)
Admission: EM | Admit: 2013-11-22 | Discharge: 2013-11-22 | Disposition: A | Discharge status: Home / Self Care | Payer: Self-pay | Attending: Emergency Medicine | Admitting: Emergency Medicine

## 2013-11-22 DIAGNOSIS — Z3202 Encounter for pregnancy test, result negative: Secondary | ICD-10-CM | POA: Insufficient documentation

## 2013-11-22 DIAGNOSIS — Z91040 Latex allergy status: Secondary | ICD-10-CM | POA: Insufficient documentation

## 2013-11-22 DIAGNOSIS — D259 Leiomyoma of uterus, unspecified: Secondary | ICD-10-CM | POA: Insufficient documentation

## 2013-11-22 DIAGNOSIS — Z79899 Other long term (current) drug therapy: Secondary | ICD-10-CM | POA: Insufficient documentation

## 2013-11-22 DIAGNOSIS — Z8619 Personal history of other infectious and parasitic diseases: Secondary | ICD-10-CM | POA: Insufficient documentation

## 2013-11-22 DIAGNOSIS — R1032 Left lower quadrant pain: Secondary | ICD-10-CM | POA: Insufficient documentation

## 2013-11-22 DIAGNOSIS — Z8742 Personal history of other diseases of the female genital tract: Secondary | ICD-10-CM | POA: Insufficient documentation

## 2013-11-22 DIAGNOSIS — R109 Unspecified abdominal pain: Secondary | ICD-10-CM

## 2013-11-22 DIAGNOSIS — N83209 Unspecified ovarian cyst, unspecified side: Secondary | ICD-10-CM | POA: Insufficient documentation

## 2013-11-22 LAB — BASIC METABOLIC PANEL
BUN: 9 mg/dL (ref 6–23)
CO2: 26 meq/L (ref 19–32)
Calcium: 9.3 mg/dL (ref 8.4–10.5)
Chloride: 101 mEq/L (ref 96–112)
Creatinine, Ser: 0.71 mg/dL (ref 0.50–1.10)
GFR calc Af Amer: >90 mL/min (ref >90)
GFR calc non Af Amer: >90 mL/min (ref >90)
GLUCOSE: 88 mg/dL (ref 70–99)
POTASSIUM: 3.8 meq/L (ref 3.7–5.3)
SODIUM: 138 meq/L (ref 137–147)

## 2013-11-22 LAB — CBC WITH DIFFERENTIAL/PLATELET
Basophils Absolute: 0 10*3/uL (ref 0.0–0.1)
Basophils Relative: 0 % (ref 0–1)
EOS ABS: 0.1 10*3/uL (ref 0.0–0.7)
EOS PCT: 2 % (ref 0–5)
HEMATOCRIT: 31.7 % — AB (ref 36.0–46.0)
HEMOGLOBIN: 10.9 g/dL — AB (ref 12.0–15.0)
LYMPHS ABS: 1.7 10*3/uL (ref 0.7–4.0)
Lymphocytes Relative: 46 % (ref 12–46)
MCH: 27.7 pg (ref 26.0–34.0)
MCHC: 34.4 g/dL (ref 30.0–36.0)
MCV: 80.7 fL (ref 78.0–100.0)
MONO ABS: 0.2 10*3/uL (ref 0.1–1.0)
MONOS PCT: 6 % (ref 3–12)
Neutro Abs: 1.8 10*3/uL (ref 1.7–7.7)
Neutrophils Relative %: 46 % (ref 43–77)
Platelets: 211 10*3/uL (ref 150–400)
RBC: 3.93 MIL/uL (ref 3.87–5.11)
RDW: 13.8 % (ref 11.5–15.5)
WBC: 3.8 10*3/uL — ABNORMAL LOW (ref 4.0–10.5)

## 2013-11-22 LAB — URINALYSIS, ROUTINE W REFLEX MICROSCOPIC
BILIRUBIN URINE: NEGATIVE
Glucose, UA: NEGATIVE mg/dL
KETONES UR: NEGATIVE mg/dL
Leukocytes, UA: NEGATIVE
Nitrite: NEGATIVE
PH: 6 (ref 5.0–8.0)
Protein, ur: NEGATIVE mg/dL
SPECIFIC GRAVITY, URINE: 1.025 (ref 1.005–1.030)
Urobilinogen, UA: 0.2 mg/dL (ref 0.0–1.0)

## 2013-11-22 LAB — URINE MICROSCOPIC-ADD ON

## 2013-11-22 LAB — PREGNANCY, URINE: Preg Test, Ur: NEGATIVE

## 2013-11-22 LAB — WET PREP, GENITAL
CLUE CELLS WET PREP: NONE SEEN
Trich, Wet Prep: NONE SEEN
Yeast Wet Prep HPF POC: NONE SEEN

## 2013-11-22 LAB — HIV ANTIBODY (ROUTINE TESTING W REFLEX): HIV 1&2 Ab, 4th Generation: NONREACTIVE

## 2013-11-22 LAB — RPR

## 2013-11-22 MED ORDER — PROMETHAZINE HCL 25 MG PO TABS: 25.0000 mg | ORAL_TABLET | Freq: Four times a day (QID) | ORAL | Status: DC | PRN

## 2013-11-22 MED ORDER — HYDROCODONE-ACETAMINOPHEN 5-325 MG PO TABS: 2.0000 | ORAL_TABLET | ORAL | Status: DC | PRN

## 2013-11-22 MED ORDER — ACETAMINOPHEN 500 MG PO TABS
1000.0000 mg | ORAL_TABLET | Freq: Once | ORAL | Status: AC
  Administered 2013-11-22: 1000 mg via ORAL
  Filled 2013-11-22: qty 2

## 2013-11-22 NOTE — ED Notes (Signed)
Pt c/o intermittent lower abd pain for the past three days, denies any n/v/d, fever, chills, vaginal discharge,

## 2013-11-22 NOTE — Discharge Instructions (Signed)
Tests were good. Follow up with Dr. Glo Herring. Medication for pain and nausea.

## 2013-11-22 NOTE — ED Provider Notes (Signed)
CSN: [id number]     Arrival date & time 11/22/13  6213 History  This chart was scribed for Nat Christen, MD by Roxan Diesel, ED scribe.  This patient was seen in room APA19/AP[facility]19 and the patient's care was started at 7:21 AM.   Chief Complaint  Patient presents with  . Abdominal Pain    The history is provided by the patient. No language interpreter was used.    Marland KitchenHPI Comments: Emily Morales is a 23 y.o. female who presents to the Emergency Department complaining of 3 days of intermittent lower abdominal pain worse on the left side.  Pt describes pain as stabbing.  Severity is moderate.  She states pain occurs in episodes lasting 1-2 minutes, approximately every 20-30 minutes.  She denies any pain at present.  She denies associated vaginal discharge or bleeding.  Pt has h/o ovarian cyst, uterine fibroids, and abnormal pap smear for which she had a colposcopy in 2013.  LNMP was April 2nd.  Pt is G1P1.    OB/GYN is Dr. Glo Herring   Past Medical History  Diagnosis Date  . Ovarian cyst, right   . Abnormal Pap smear   . Hx of chlamydia infection   . Headache(784.0)   . Uterine fibroid     Past Surgical History  Procedure Laterality Date  . No past surgeries    . Colposcopy      Family History  Problem Relation Age of Onset  . Diabetes Father   . Hypertension Father   . Cancer Maternal Grandmother     lung  . Heart disease Paternal Grandmother   . Other Son     reactive airway disease    History  Substance Use Topics  . Smoking status: Never Smoker   . Smokeless tobacco: Never Used  . Alcohol Use: Yes     Comment: occ    OB History   Grav Para Term Preterm Abortions TAB SAB Ect Mult Living   1 1 1  0 0 0 0 0 0 1       Review of Systems A complete 10 system review of systems was obtained and all systems are negative except as noted in the HPI and PMH.     Allergies  Latex; Other; and Pollen extract  Home Medications   Prior to Admission medications    Medication Sig Start Date End Date Taking? Authorizing Provider  ibuprofen (ADVIL,MOTRIN) 200 MG tablet Take 800 mg by mouth every 6 (six) hours as needed for moderate pain.   Yes Historical Provider, MD  cyclobenzaprine (FLEXERIL) 10 MG tablet Take 1 tablet (10 mg total) by mouth 3 (three) times daily as needed. 07/03/13   [name] L. Triplett, PA-C  diclofenac (VOLTAREN) 75 MG EC tablet Take 1 tablet (75 mg total) by mouth 2 (two) times daily. Take with food 07/03/13   [name] L. Triplett, PA-C   BP 110/69  Pulse 88  Temp(Src) 98.2 F (36.8 C) (Oral)  Resp 18  Ht 5\' 6"  (1.676 m)  Wt 192 lb (87.091 kg)  BMI 31.00 kg/m2  SpO2 100%  LMP 10/30/2013  Physical Exam  Nursing note and vitals reviewed. Constitutional: She is oriented to person, place, and time. She appears well-developed and well-nourished.  HENT:  Head: Normocephalic and atraumatic.  Eyes: Conjunctivae and EOM are normal. Pupils are equal, round, and reactive to light.  Neck: Normal range of motion. Neck supple.  Cardiovascular: Normal rate, regular rhythm and normal heart sounds.   Pulmonary/Chest: Effort normal  and breath sounds normal.  Abdominal: Soft. Bowel sounds are normal. There is tenderness (minimal LLQ tenderness).  Genitourinary:  Normal external genitalia.  Slight cervical discharge, but cervix is nontender. Adnexa nontender. Uterus nontender  Musculoskeletal: Normal range of motion.  Neurological: She is alert and oriented to person, place, and time.  Skin: Skin is warm and dry.  Psychiatric: She has a normal mood and affect. Her behavior is normal.    ED Course  Procedures (including critical care time)  DIAGNOSTIC STUDIES: Oxygen Saturation is 100% on room air, normal by my interpretation.    COORDINATION OF CARE: 7:26 AM-Discussed treatment plan which includes pelvic exam and UA with pt at bedside and pt agreed to plan.    Results for orders placed during the hospital encounter of 11/22/13  WET  PREP, GENITAL      Result Value Ref Range   Yeast Wet Prep HPF POC NONE SEEN  NONE SEEN   Trich, Wet Prep NONE SEEN  NONE SEEN   Clue Cells Wet Prep HPF POC NONE SEEN  NONE SEEN   WBC, Wet Prep HPF POC MANY (*) NONE SEEN  URINALYSIS, ROUTINE W REFLEX MICROSCOPIC      Result Value Ref Range   Color, Urine YELLOW  YELLOW   APPearance HAZY (*) CLEAR   Specific Gravity, Urine 1.025  1.005 - 1.030   pH 6.0  5.0 - 8.0   Glucose, UA NEGATIVE  NEGATIVE mg/dL   Hgb urine dipstick SMALL (*) NEGATIVE   Bilirubin Urine NEGATIVE  NEGATIVE   Ketones, ur NEGATIVE  NEGATIVE mg/dL   Protein, ur NEGATIVE  NEGATIVE mg/dL   Urobilinogen, UA 0.2  0.0 - 1.0 mg/dL   Nitrite NEGATIVE  NEGATIVE   Leukocytes, UA NEGATIVE  NEGATIVE  PREGNANCY, URINE      Result Value Ref Range   Preg Test, Ur NEGATIVE  NEGATIVE  BASIC METABOLIC PANEL      Result Value Ref Range   Sodium 138  137 - 147 mEq/L   Potassium 3.8  3.7 - 5.3 mEq/L   Chloride 101  96 - 112 mEq/L   CO2 26  19 - 32 mEq/L   Glucose, Bld 88  70 - 99 mg/dL   BUN 9  6 - 23 mg/dL   Creatinine, Ser 0.71  0.50 - 1.10 mg/dL   Calcium 9.3  8.4 - 10.5 mg/dL   GFR calc non Af Amer >90  >90 mL/min   GFR calc Af Amer >90  >90 mL/min  CBC WITH DIFFERENTIAL      Result Value Ref Range   WBC 3.8 (*) 4.0 - 10.5 K/uL   RBC 3.93  3.87 - 5.11 MIL/uL   Hemoglobin 10.9 (*) 12.0 - 15.0 g/dL   HCT 31.7 (*) 36.0 - 46.0 %   MCV 80.7  78.0 - 100.0 fL   MCH 27.7  26.0 - 34.0 pg   MCHC 34.4  30.0 - 36.0 g/dL   RDW 13.8  11.5 - 15.5 %   Platelets 211  150 - 400 K/uL   Neutrophils Relative % 46  43 - 77 %   Neutro Abs 1.8  1.7 - 7.7 K/uL   Lymphocytes Relative 46  12 - 46 %   Lymphs Abs 1.7  0.7 - 4.0 K/uL   Monocytes Relative 6  3 - 12 %   Monocytes Absolute 0.2  0.1 - 1.0 K/uL   Eosinophils Relative 2  0 - 5 %   Eosinophils  Absolute 0.1  0.0 - 0.7 K/uL   Basophils Relative 0  0 - 1 %   Basophils Absolute 0.0  0.0 - 0.1 K/uL  URINE MICROSCOPIC-ADD ON       Result Value Ref Range   Squamous Epithelial / LPF MANY (*) RARE   WBC, UA 0-2  <3 WBC/hpf   RBC / HPF 0-2  <3 RBC/hpf   Bacteria, UA FEW (*) RARE   Urine-Other LESS THAN 10 mL OF URINE SUBMITTED       MDM   Final diagnoses:  Abdominal pain    No acute abdomen. Pelvic exam shows no obvious infection. Rx Norco and Phenergan 25 mg. Patient has GYN followup.    I personally performed the services described in this documentation, which was scribed in my presence. The recorded information has been reviewed and is accurate.    Nat Christen, MD 11/22/13 1100

## 2013-11-25 LAB — GC/CHLAMYDIA PROBE AMP
CT PROBE, AMP APTIMA: NEGATIVE
GC Probe RNA: NEGATIVE

## 2014-03-03 ENCOUNTER — Other Ambulatory Visit (HOSPITAL_COMMUNITY)
Admission: RE | Admit: 2014-03-03 | Discharge: 2014-03-03 | Disposition: A | Discharge status: Home / Self Care | Payer: BC Managed Care – PPO | Source: Ambulatory Visit | Attending: Obstetrics & Gynecology | Admitting: Obstetrics & Gynecology

## 2014-03-03 ENCOUNTER — Ambulatory Visit (INDEPENDENT_AMBULATORY_CARE_PROVIDER_SITE_OTHER): Payer: BC Managed Care – PPO | Admitting: Obstetrics & Gynecology

## 2014-03-03 ENCOUNTER — Encounter: Payer: Self-pay | Admitting: Obstetrics & Gynecology

## 2014-03-03 VITALS — BP 110/80 | Ht 65.0 in | Wt 196.0 lb

## 2014-03-03 DIAGNOSIS — Z01419 Encounter for gynecological examination (general) (routine) without abnormal findings: Secondary | ICD-10-CM

## 2014-03-03 MED ORDER — METRONIDAZOLE 500 MG PO TABS: 500.0000 mg | ORAL_TABLET | Freq: Two times a day (BID) | ORAL | Status: DC

## 2014-03-03 NOTE — Progress Notes (Signed)
Patient ID: Emily Morales, female   DOB: 1990/10/29, 23 y.o.   MRN: [id number] Subjective:     Emily Morales is a 23 y.o. female here for a routine exam.  Patient's last menstrual period was 02/16/2014. G1P1001 Birth Control Method:  None, pt wants to get pregnant Menstrual Calendar(currently): 32 days  Current complaints: constipation.   Current acute medical issues:  none   Recent Gynecologic History Patient's last menstrual period was 02/16/2014. Last Pap: 2014,  normal Last mammogram: ,    Past Medical History  Diagnosis Date  . Ovarian cyst, right   . Abnormal Pap smear   . Hx of chlamydia infection   . Headache(784.0)   . Uterine fibroid     Past Surgical History  Procedure Laterality Date  . No past surgeries    . Colposcopy      OB History   Grav Para Term Preterm Abortions TAB SAB Ect Mult Living   1 1 1  0 0 0 0 0 0 1      History   Social History  . Marital Status: Single    Spouse Name: N/A    Number of Children: N/A  . Years of Education: N/A   Social History Main Topics  . Smoking status: Never Smoker   . Smokeless tobacco: Never Used  . Alcohol Use: Yes     Comment: occ  . Drug Use: No  . Sexual Activity: Yes    Birth Control/ Protection: None   Other Topics Concern  . None   Social History Narrative  . None    Family History  Problem Relation Age of Onset  . Diabetes Father   . Hypertension Father   . Cancer Maternal Grandmother     lung  . Heart disease Paternal Grandmother   . Other Son     reactive airway disease     Review of Systems  Review of Systems  Constitutional: Negative for fever, chills, weight loss, malaise/fatigue and diaphoresis.  HENT: Negative for hearing loss, ear pain, nosebleeds, congestion, sore throat, neck pain, tinnitus and ear discharge.   Eyes: Negative for blurred vision, double vision, photophobia, pain, discharge and redness.  Respiratory: Negative for cough, hemoptysis, sputum production,  shortness of breath, wheezing and stridor.   Cardiovascular: Negative for chest pain, palpitations, orthopnea, claudication, leg swelling and PND.  Gastrointestinal: negative for abdominal pain. Negative for heartburn, nausea, vomiting, diarrhea, constipation, blood in stool and melena.  Genitourinary: Negative for dysuria, urgency, frequency, hematuria and flank pain.  Musculoskeletal: Negative for myalgias, back pain, joint pain and falls.  Skin: Negative for itching and rash.  Neurological: Negative for dizziness, tingling, tremors, sensory change, speech change, focal weakness, seizures, loss of consciousness, weakness and headaches.  Endo/Heme/Allergies: Negative for environmental allergies and polydipsia. Does not bruise/bleed easily.  Psychiatric/Behavioral: Negative for depression, suicidal ideas, hallucinations, memory loss and substance abuse. The patient is not nervous/anxious and does not have insomnia.        Objective:    Physical Exam  Vitals reviewed. Constitutional: She is oriented to person, place, and time. She appears well-developed and well-nourished.  HENT:  Head: Normocephalic and atraumatic.        Right Ear: External ear normal.  Left Ear: External ear normal.  Nose: Nose normal.  Mouth/Throat: Oropharynx is clear and moist.  Eyes: Conjunctivae and EOM are normal. Pupils are equal, round, and reactive to light. Right eye exhibits no discharge. Left eye exhibits no discharge. No scleral icterus.  Neck: Normal range of motion. Neck supple. No tracheal deviation present. No thyromegaly present.  Cardiovascular: Normal rate, regular rhythm, normal heart sounds and intact distal pulses.  Exam reveals no gallop and no friction rub.   No murmur heard. Respiratory: Effort normal and breath sounds normal. No respiratory distress. She has no wheezes. She has no rales. She exhibits no tenderness.  GI: Soft. Bowel sounds are normal. She exhibits no distension and no mass.  There is no tenderness. There is no rebound and no guarding.  Genitourinary:  Breasts no masses skin changes or nipple changes bilaterally      Vulva is normal without lesions Vagina is pink moist without discharge Cervix normal in appearance and pap is done Uterus is normal size shape and contour Adnexa is negative with normal sized ovaries   Musculoskeletal: Normal range of motion. She exhibits no edema and no tenderness.  Neurological: She is alert and oriented to person, place, and time. She has normal reflexes. She displays normal reflexes. No cranial nerve deficit. She exhibits normal muscle tone. Coordination normal.  Skin: Skin is warm and dry. No rash noted. No erythema. No pallor.  Psychiatric: She has a normal mood and affect. Her behavior is normal. Judgment and thought content normal.       Assessment:    Healthy female exam.    Plan:    Follow up in: 1 year.

## 2014-03-03 NOTE — Addendum Note (Signed)
Addended by: Florian Buff on: 03/03/2014 10:33 AM   Modules accepted: Orders

## 2014-03-05 LAB — CYTOLOGY - PAP

## 2014-03-18 ENCOUNTER — Telehealth: Payer: Self-pay | Admitting: *Deleted

## 2014-03-18 NOTE — Telephone Encounter (Signed)
Message copied by Doyne Keel on Wed Mar 18, 2014 10:57 AM ------      Message from: Florian Buff      Created: Wed Mar 18, 2014  9:52 AM       Please schedule patient for a colposcopy appointment            Thanks            Dr Elonda Husky      ----- Message -----         From: [facility] Interface         Sent: 03/06/2014   8:56 PM           To: Florian Buff, MD                   ------

## 2014-03-18 NOTE — Telephone Encounter (Signed)
Pt informed of abnormal pap (LSIL) from 03/03/2014, Colposcopy procedure discussed, all questions answered, and call transferred to front staff for an appt to be made for the procedure with Dr. Elonda Husky.

## 2014-03-26 ENCOUNTER — Ambulatory Visit (INDEPENDENT_AMBULATORY_CARE_PROVIDER_SITE_OTHER): Payer: BC Managed Care – PPO | Admitting: Obstetrics & Gynecology

## 2014-03-26 ENCOUNTER — Encounter: Payer: Self-pay | Admitting: Obstetrics & Gynecology

## 2014-03-26 VITALS — BP 120/80 | Wt 195.0 lb

## 2014-03-26 DIAGNOSIS — N870 Mild cervical dysplasia: Secondary | ICD-10-CM

## 2014-03-26 NOTE — Progress Notes (Signed)
Patient ID: Emily Morales, female   DOB: 08/09/1990, 23 y.o.   MRN: [id number] Colposcopy Procedure Note:  Pap performed:  02/2013 Result:  LSIL Smoker:  No. New sexual partner:  No.  : time frame:    History of abnormal Pap: yes  Due to the indications listed above, a thorough colposcopic evaluation of the cervix and upper vagina was performed in the usual fashion using 3% acetic acid.  The findings of the visual inspection are noted below:  adequate Acetowhite changes       positive Punctation                      negative Mosaicism                      negative Abnormal Vessels          negative  Biopsy performed:          No.  Complications: no   Impression: HPV atypia  Recommendation: Follow up cytology 1 year      Past Medical History  Diagnosis Date  . Ovarian cyst, right   . Abnormal Pap smear   . Hx of chlamydia infection   . Headache(784.0)   . Uterine fibroid     Past Surgical History  Procedure Laterality Date  . No past surgeries    . Colposcopy      OB History   Grav Para Term Preterm Abortions TAB SAB Ect Mult Living   1 1 1  0 0 0 0 0 0 1      Allergies  Allergen Reactions  . Latex   . Other     Coconut causes a rash  . Pollen Extract Cough    History   Social History  . Marital Status: Single    Spouse Name: N/A    Number of Children: N/A  . Years of Education: N/A   Social History Main Topics  . Smoking status: Never Smoker   . Smokeless tobacco: Never Used  . Alcohol Use: Yes     Comment: occ  . Drug Use: No  . Sexual Activity: Yes    Birth Control/ Protection: None   Other Topics Concern  . None   Social History Narrative  . None    Family History  Problem Relation Age of Onset  . Diabetes Father   . Hypertension Father   . Cancer Maternal Grandmother     lung  . Heart disease Paternal Grandmother   . Other Son     reactive airway disease

## 2014-06-01 ENCOUNTER — Encounter: Payer: Self-pay | Admitting: Obstetrics & Gynecology

## 2014-06-09 ENCOUNTER — Emergency Department (HOSPITAL_COMMUNITY)
Admission: EM | Admit: 2014-06-09 | Discharge: 2014-06-09 | Disposition: A | Discharge status: Home / Self Care | Payer: BC Managed Care – PPO | Attending: Emergency Medicine | Admitting: Emergency Medicine

## 2014-06-09 ENCOUNTER — Encounter (HOSPITAL_COMMUNITY): Payer: Self-pay | Admitting: Emergency Medicine

## 2014-06-09 DIAGNOSIS — Z8619 Personal history of other infectious and parasitic diseases: Secondary | ICD-10-CM | POA: Insufficient documentation

## 2014-06-09 DIAGNOSIS — Z792 Long term (current) use of antibiotics: Secondary | ICD-10-CM | POA: Insufficient documentation

## 2014-06-09 DIAGNOSIS — Z79899 Other long term (current) drug therapy: Secondary | ICD-10-CM | POA: Diagnosis not present

## 2014-06-09 DIAGNOSIS — Z91040 Latex allergy status: Secondary | ICD-10-CM | POA: Insufficient documentation

## 2014-06-09 DIAGNOSIS — Z3202 Encounter for pregnancy test, result negative: Secondary | ICD-10-CM | POA: Insufficient documentation

## 2014-06-09 DIAGNOSIS — N390 Urinary tract infection, site not specified: Secondary | ICD-10-CM | POA: Diagnosis not present

## 2014-06-09 DIAGNOSIS — R300 Dysuria: Secondary | ICD-10-CM | POA: Diagnosis present

## 2014-06-09 DIAGNOSIS — N939 Abnormal uterine and vaginal bleeding, unspecified: Secondary | ICD-10-CM | POA: Diagnosis not present

## 2014-06-09 LAB — WET PREP, GENITAL
Clue Cells Wet Prep HPF POC: NONE SEEN
Trich, Wet Prep: NONE SEEN
WBC, Wet Prep HPF POC: NONE SEEN
YEAST WET PREP: NONE SEEN

## 2014-06-09 LAB — URINALYSIS, ROUTINE W REFLEX MICROSCOPIC
Bilirubin Urine: NEGATIVE
Glucose, UA: NEGATIVE mg/dL
Ketones, ur: NEGATIVE mg/dL
Nitrite: NEGATIVE
PH: 6 (ref 5.0–8.0)
Protein, ur: NEGATIVE mg/dL
UROBILINOGEN UA: 0.2 mg/dL (ref 0.0–1.0)

## 2014-06-09 LAB — URINE MICROSCOPIC-ADD ON

## 2014-06-09 LAB — PREGNANCY, URINE: Preg Test, Ur: NEGATIVE — AB

## 2014-06-09 MED ORDER — PHENAZOPYRIDINE HCL 200 MG PO TABS: 200.0000 mg | ORAL_TABLET | Freq: Three times a day (TID) | ORAL | Status: DC

## 2014-06-09 MED ORDER — PHENAZOPYRIDINE HCL 100 MG PO TABS
100.0000 mg | ORAL_TABLET | Freq: Once | ORAL | Status: AC
  Administered 2014-06-09: 100 mg via ORAL
  Filled 2014-06-09: qty 1

## 2014-06-09 MED ORDER — NITROFURANTOIN MONOHYD MACRO 100 MG PO CAPS
100.0000 mg | ORAL_CAPSULE | Freq: Once | ORAL | Status: AC
  Administered 2014-06-09: 100 mg via ORAL
  Filled 2014-06-09: qty 1

## 2014-06-09 MED ORDER — NITROFURANTOIN MONOHYD MACRO 100 MG PO CAPS: 100.0000 mg | ORAL_CAPSULE | Freq: Two times a day (BID) | ORAL | Status: DC

## 2014-06-09 NOTE — ED Provider Notes (Signed)
CSN: [id number]     Arrival date & time 06/09/14  1853 History   This chart was scribed for Emily Furry, MD by Emily Morales, ED Scribe. The patient was seen in [place] and the patient's care was started at 7:37 PM.    Chief Complaint  Patient presents with  . Vaginal Itching   Patient is a 23 y.o. female presenting with vaginal itching. The history is provided by the patient. No language interpreter was used.  Vaginal Itching Pertinent negatives include no chest pain, no abdominal pain, no headaches and no shortness of breath.    HPI Comments: Emily Morales is a 23 y.o. female who presents to the Emergency Department complaining of vaginal itching and bleeding. She has dysuria and swelling as associated symptoms Pt states yesterday it started itching. She notes she wore a new pair of underwear which was nylon. She used an internal monistat today and it immediately started burning. Pt had a yeast infection when she was pregnant. She states when she was younger she was told to wear cotton because the nylon irritated her. Her LNMP was Oct 12. Pt denies vaginal discharge  Past Medical History  Diagnosis Date  . Ovarian cyst, right   . Abnormal Pap smear   . Hx of chlamydia infection   . Headache(784.0)   . Uterine fibroid    Past Surgical History  Procedure Laterality Date  . No past surgeries    . Colposcopy     Family History  Problem Relation Age of Onset  . Diabetes Father   . Hypertension Father   . Cancer Maternal Grandmother     lung  . Heart disease Paternal Grandmother   . Other Son     reactive airway disease   History  Substance Use Topics  . Smoking status: Never Smoker   . Smokeless tobacco: Never Used  . Alcohol Use: Yes     Comment: occ   OB History    Gravida Para Term Preterm AB TAB SAB Ectopic Multiple Living   1 1 1  0 0 0 0 0 0 1     Review of Systems  Constitutional: Negative for fever, chills, diaphoresis, appetite change and fatigue.   HENT: Negative for mouth sores, sore throat and trouble swallowing.   Eyes: Negative for visual disturbance.  Respiratory: Negative for cough, chest tightness, shortness of breath and wheezing.   Cardiovascular: Negative for chest pain.  Gastrointestinal: Negative for nausea, vomiting, abdominal pain, diarrhea and abdominal distention.  Endocrine: Negative for polydipsia, polyphagia and polyuria.  Genitourinary: Positive for dysuria, hematuria, vaginal bleeding and vaginal pain. Negative for frequency and vaginal discharge.  Musculoskeletal: Negative for gait problem.  Skin: Negative for color change, pallor and rash.  Neurological: Negative for dizziness, syncope, light-headedness and headaches.  Hematological: Does not bruise/bleed easily.  Psychiatric/Behavioral: Negative for behavioral problems and confusion.  All other systems reviewed and are negative.  Allergies  Latex; Other; and Pollen extract  Home Medications   Prior to Admission medications   Medication Sig Start Date End Date Taking? Authorizing Provider  HYDROcodone-acetaminophen (NORCO) 5-325 MG per tablet Take 2 tablets by mouth every 4 (four) hours as needed. 11/22/13   Nat Christen, MD  ibuprofen (ADVIL,MOTRIN) 200 MG tablet Take 800 mg by mouth every 6 (six) hours as needed for moderate pain.    Historical Provider, MD  metroNIDAZOLE (FLAGYL) 500 MG tablet Take 1 tablet (500 mg total) by mouth 2 (two) times daily. 03/03/14  Florian Buff, MD  nitrofurantoin, macrocrystal-monohydrate, (MACROBID) 100 MG capsule Take 1 capsule (100 mg total) by mouth 2 (two) times daily. 06/09/14   Emily Furry, MD  phenazopyridine (PYRIDIUM) 200 MG tablet Take 1 tablet (200 mg total) by mouth 3 (three) times daily. 06/09/14   Emily Furry, MD  promethazine (PHENERGAN) 25 MG tablet Take 1 tablet (25 mg total) by mouth every 6 (six) hours as needed. 11/22/13   Nat Christen, MD   BP 112/70 mmHg  Pulse 84  Temp(Src) 99.1 F (37.3 C) (Oral)  Resp  18  Ht 5\' 5"  (1.651 m)  Wt 197 lb (89.359 kg)  BMI 32.78 kg/m2  SpO2 100%  LMP 05/11/2014 Physical Exam  Constitutional: She is oriented to person, place, and time. She appears well-developed and well-nourished. No distress.  HENT:  Head: Normocephalic.  Eyes: Conjunctivae are normal. Pupils are equal, round, and reactive to light. No scleral icterus.  Neck: Normal range of motion. Neck supple. No thyromegaly present.  Cardiovascular: Normal rate and regular rhythm.  Exam reveals no gallop and no friction rub.   No murmur heard. Pulmonary/Chest: Effort normal and breath sounds normal. No respiratory distress. She has no wheezes. She has no rales.  Abdominal: Soft. Bowel sounds are normal. She exhibits no distension. There is no tenderness. There is no rebound.  Musculoskeletal: Normal range of motion.  Neurological: She is alert and oriented to person, place, and time.  Skin: Skin is warm and dry. No rash noted.  Psychiatric: She has a normal mood and affect. Her behavior is normal.    ED Course  Procedures  DIAGNOSTIC STUDIES: Oxygen Saturation is 100% on room air, normal by my interpretation.    COORDINATION OF CARE: 7:42 PM Discussed treatment plan with pt at bedside and pt agreed to plan.   Labs Review Labs Reviewed  URINALYSIS, ROUTINE W REFLEX MICROSCOPIC - Abnormal; Notable for the following:    Specific Gravity, Urine >1.030 (*)    Hgb urine dipstick MODERATE (*)    Leukocytes, UA TRACE (*)    All other components within normal limits  PREGNANCY, URINE - Abnormal; Notable for the following:    Preg Test, Ur NEGATIVE (*)    All other components within normal limits  URINE MICROSCOPIC-ADD ON - Abnormal; Notable for the following:    Squamous Epithelial / LPF FEW (*)    Bacteria, UA MANY (*)    All other components within normal limits  WET PREP, GENITAL  GC/CHLAMYDIA PROBE AMP    Imaging Review No results found.   EKG Interpretation None      MDM    Final diagnoses:  UTI (lower urinary tract infection)    No obvious infection clinically or per swab. Urine appears infected. Having burning and dysuria. Plan is Macrobid I radium.  I personally performed the services described in this documentation, which was scribed in my presence. The recorded information has been reviewed and is accurate.    Emily Furry, MD 06/09/14 2151

## 2014-06-09 NOTE — ED Notes (Signed)
Pt c/o vaginal irritation (itching/burning) today, started seeing blood when wiping. States she wore a pair of new underwear and noticed the dye transferred onto her pants.

## 2014-06-09 NOTE — Discharge Instructions (Signed)

## 2014-06-11 LAB — GC/CHLAMYDIA PROBE AMP
CT Probe RNA: NEGATIVE
GC PROBE AMP APTIMA: NEGATIVE

## 2014-07-01 ENCOUNTER — Encounter (HOSPITAL_COMMUNITY): Payer: Self-pay

## 2014-07-01 ENCOUNTER — Emergency Department (HOSPITAL_COMMUNITY): Payer: BC Managed Care – PPO

## 2014-07-01 ENCOUNTER — Emergency Department (HOSPITAL_COMMUNITY)
Admission: EM | Admit: 2014-07-01 | Discharge: 2014-07-01 | Disposition: A | Discharge status: Home / Self Care | Payer: BC Managed Care – PPO | Attending: Emergency Medicine | Admitting: Emergency Medicine

## 2014-07-01 DIAGNOSIS — S3991XA Unspecified injury of abdomen, initial encounter: Secondary | ICD-10-CM | POA: Diagnosis not present

## 2014-07-01 DIAGNOSIS — Z8619 Personal history of other infectious and parasitic diseases: Secondary | ICD-10-CM | POA: Diagnosis not present

## 2014-07-01 DIAGNOSIS — Y998 Other external cause status: Secondary | ICD-10-CM | POA: Diagnosis not present

## 2014-07-01 DIAGNOSIS — S2020XA Contusion of thorax, unspecified, initial encounter: Secondary | ICD-10-CM | POA: Diagnosis not present

## 2014-07-01 DIAGNOSIS — S299XXA Unspecified injury of thorax, initial encounter: Secondary | ICD-10-CM | POA: Diagnosis present

## 2014-07-01 DIAGNOSIS — Z79899 Other long term (current) drug therapy: Secondary | ICD-10-CM | POA: Insufficient documentation

## 2014-07-01 DIAGNOSIS — Y9389 Activity, other specified: Secondary | ICD-10-CM | POA: Insufficient documentation

## 2014-07-01 DIAGNOSIS — Y92410 Unspecified street and highway as the place of occurrence of the external cause: Secondary | ICD-10-CM | POA: Insufficient documentation

## 2014-07-01 DIAGNOSIS — Z91040 Latex allergy status: Secondary | ICD-10-CM | POA: Insufficient documentation

## 2014-07-01 DIAGNOSIS — Z8742 Personal history of other diseases of the female genital tract: Secondary | ICD-10-CM | POA: Insufficient documentation

## 2014-07-01 DIAGNOSIS — S20219A Contusion of unspecified front wall of thorax, initial encounter: Secondary | ICD-10-CM

## 2014-07-01 MED ORDER — CYCLOBENZAPRINE HCL 10 MG PO TABS: 10.0000 mg | ORAL_TABLET | Freq: Three times a day (TID) | ORAL | Status: DC | PRN

## 2014-07-01 MED ORDER — IBUPROFEN 800 MG PO TABS
800.0000 mg | ORAL_TABLET | Freq: Once | ORAL | Status: AC
  Administered 2014-07-01: 800 mg via ORAL
  Filled 2014-07-01: qty 1

## 2014-07-01 MED ORDER — CYCLOBENZAPRINE HCL 10 MG PO TABS
10.0000 mg | ORAL_TABLET | Freq: Once | ORAL | Status: AC
  Administered 2014-07-01: 10 mg via ORAL
  Filled 2014-07-01: qty 1

## 2014-07-01 MED ORDER — IBUPROFEN 800 MG PO TABS: 800.0000 mg | ORAL_TABLET | Freq: Three times a day (TID) | ORAL | Status: DC
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
## 2014-07-15 ENCOUNTER — Other Ambulatory Visit (HOSPITAL_COMMUNITY)
Admission: RE | Admit: 2014-07-15 | Discharge: 2014-07-15 | Disposition: A | Discharge status: Home / Self Care | Payer: BC Managed Care – PPO | Source: Ambulatory Visit | Attending: General Surgery | Admitting: General Surgery

## 2014-07-15 DIAGNOSIS — D170 Benign lipomatous neoplasm of skin and subcutaneous tissue of head, face and neck: Secondary | ICD-10-CM | POA: Diagnosis not present

## 2014-07-15 DIAGNOSIS — R220 Localized swelling, mass and lump, head: Secondary | ICD-10-CM | POA: Diagnosis present

## 2014-09-07 ENCOUNTER — Encounter (HOSPITAL_COMMUNITY): Payer: Self-pay | Admitting: *Deleted

## 2014-09-07 ENCOUNTER — Emergency Department (HOSPITAL_COMMUNITY)
Admission: EM | Admit: 2014-09-07 | Discharge: 2014-09-07 | Discharge status: Against Medical Advice | Payer: Self-pay | Attending: Emergency Medicine | Admitting: Emergency Medicine

## 2014-09-07 DIAGNOSIS — R42 Dizziness and giddiness: Secondary | ICD-10-CM | POA: Insufficient documentation

## 2014-09-07 NOTE — ED Notes (Signed)
Had lipoma removed from scalp in Dec 2015 by Dr Romona Curls .  Since then has had dizziness  ,  Nausea, since then

## 2014-09-07 NOTE — ED Notes (Signed)
Called patient to place in room. No answer. 

## 2014-09-07 NOTE — ED Notes (Signed)
Registration states "We have called her a couple of times, and she hasn't answered." Patient left after triage but before being placed in room.

## 2014-11-30 ENCOUNTER — Emergency Department (HOSPITAL_COMMUNITY)
Admission: EM | Admit: 2014-11-30 | Discharge: 2014-11-30 | Disposition: A | Discharge status: Home / Self Care | Payer: Self-pay | Attending: Emergency Medicine | Admitting: Emergency Medicine

## 2014-11-30 ENCOUNTER — Encounter (HOSPITAL_COMMUNITY): Payer: Self-pay | Admitting: Emergency Medicine

## 2014-11-30 ENCOUNTER — Emergency Department (HOSPITAL_COMMUNITY): Payer: Self-pay

## 2014-11-30 DIAGNOSIS — Z8619 Personal history of other infectious and parasitic diseases: Secondary | ICD-10-CM | POA: Insufficient documentation

## 2014-11-30 DIAGNOSIS — Z8742 Personal history of other diseases of the female genital tract: Secondary | ICD-10-CM | POA: Insufficient documentation

## 2014-11-30 DIAGNOSIS — R519 Headache, unspecified: Secondary | ICD-10-CM

## 2014-11-30 DIAGNOSIS — Z86018 Personal history of other benign neoplasm: Secondary | ICD-10-CM | POA: Insufficient documentation

## 2014-11-30 DIAGNOSIS — R51 Headache: Secondary | ICD-10-CM | POA: Insufficient documentation

## 2014-11-30 DIAGNOSIS — Z91040 Latex allergy status: Secondary | ICD-10-CM | POA: Insufficient documentation

## 2014-11-30 DIAGNOSIS — Z79899 Other long term (current) drug therapy: Secondary | ICD-10-CM | POA: Insufficient documentation

## 2014-11-30 LAB — COMPREHENSIVE METABOLIC PANEL
ALBUMIN: 3.9 g/dL (ref 3.5–5.0)
ALK PHOS: 73 U/L (ref 38–126)
ALT: 13 U/L — ABNORMAL LOW (ref 14–54)
ANION GAP: 8 (ref 5–15)
AST: 17 U/L (ref 15–41)
BUN: 8 mg/dL (ref 6–20)
CHLORIDE: 102 mmol/L (ref 101–111)
CO2: 25 mmol/L (ref 22–32)
Calcium: 8.9 mg/dL (ref 8.9–10.3)
Creatinine, Ser: 0.65 mg/dL (ref 0.44–1.00)
Glucose, Bld: 84 mg/dL (ref 70–99)
Potassium: 3.5 mmol/L (ref 3.5–5.1)
Sodium: 135 mmol/L (ref 135–145)
TOTAL PROTEIN: 7.7 g/dL (ref 6.5–8.1)
Total Bilirubin: 0.6 mg/dL (ref 0.3–1.2)

## 2014-11-30 LAB — CBC WITH DIFFERENTIAL/PLATELET
Basophils Absolute: 0 10*3/uL (ref 0.0–0.1)
Basophils Relative: 0 % (ref 0–1)
EOS PCT: 1 % (ref 0–5)
Eosinophils Absolute: 0 10*3/uL (ref 0.0–0.7)
HCT: 32.4 % — ABNORMAL LOW (ref 36.0–46.0)
Hemoglobin: 10.7 g/dL — ABNORMAL LOW (ref 12.0–15.0)
LYMPHS PCT: 26 % (ref 12–46)
Lymphs Abs: 0.8 10*3/uL (ref 0.7–4.0)
MCH: 26.7 pg (ref 26.0–34.0)
MCHC: 33 g/dL (ref 30.0–36.0)
MCV: 80.8 fL (ref 78.0–100.0)
MONOS PCT: 8 % (ref 3–12)
Monocytes Absolute: 0.2 10*3/uL (ref 0.1–1.0)
NEUTROS PCT: 65 % (ref 43–77)
Neutro Abs: 1.9 10*3/uL (ref 1.7–7.7)
Platelets: 208 10*3/uL (ref 150–400)
RBC: 4.01 MIL/uL (ref 3.87–5.11)
RDW: 14.6 % (ref 11.5–15.5)
WBC: 3 10*3/uL — AB (ref 4.0–10.5)

## 2014-11-30 MED ORDER — HYDROCODONE-ACETAMINOPHEN 5-325 MG PO TABS: 1.0000 | ORAL_TABLET | Freq: Four times a day (QID) | ORAL | Status: DC | PRN

## 2014-11-30 MED ORDER — HYDROCODONE-ACETAMINOPHEN 5-325 MG PO TABS
1.0000 | ORAL_TABLET | Freq: Once | ORAL | Status: AC
  Administered 2014-11-30: 1 via ORAL
  Filled 2014-11-30: qty 1

## 2014-11-30 NOTE — ED Provider Notes (Signed)
CSN: [id number]     Arrival date & time 11/30/14  1655 History   First MD Initiated Contact with Patient 11/30/14 1707     Chief Complaint  Patient presents with  . Headache     (Consider location/radiation/quality/duration/timing/severity/associated sxs/prior Treatment) Patient is a 24 y.o. female presenting with headaches. The history is provided by the patient (the pt complains of a headache).  Headache Pain location:  Generalized Quality:  Dull Radiates to:  Does not radiate Severity currently:  7/10 Severity at highest:  8/10 Onset quality:  Gradual Timing:  Intermittent Progression:  Waxing and waning Chronicity:  Recurrent Associated symptoms: no abdominal pain, no back pain, no congestion, no cough, no diarrhea, no fatigue, no seizures and no sinus pressure     Past Medical History  Diagnosis Date  . Ovarian cyst, right   . Abnormal Pap smear   . Hx of chlamydia infection   . Headache(784.0)   . Uterine fibroid    Past Surgical History  Procedure Laterality Date  . No past surgeries    . Colposcopy    . Lipoma surgery     Family History  Problem Relation Age of Onset  . Diabetes Father   . Hypertension Father   . Cancer Maternal Grandmother     lung  . Heart disease Paternal Grandmother   . Other Son     reactive airway disease   History  Substance Use Topics  . Smoking status: Never Smoker   . Smokeless tobacco: Never Used  . Alcohol Use: Yes     Comment: occ   OB History    Gravida Para Term Preterm AB TAB SAB Ectopic Multiple Living   1 1 1  0 0 0 0 0 0 1     Review of Systems  Constitutional: Negative for appetite change and fatigue.  HENT: Negative for congestion, ear discharge and sinus pressure.   Eyes: Negative for discharge.  Respiratory: Negative for cough.   Cardiovascular: Negative for chest pain.  Gastrointestinal: Negative for abdominal pain and diarrhea.  Genitourinary: Negative for frequency and hematuria.  Musculoskeletal:  Negative for back pain.  Skin: Negative for rash.  Neurological: Positive for headaches. Negative for seizures.  Psychiatric/Behavioral: Negative for hallucinations.      Allergies  Latex; Other; and Pollen extract  Home Medications   Prior to Admission medications   Medication Sig Start Date End Date Taking? Authorizing Provider  acetaminophen (TYLENOL) 500 MG tablet Take 500 mg by mouth every 6 (six) hours as needed for mild pain or moderate pain.   Yes Historical Provider, MD  aspirin-acetaminophen-caffeine (EXCEDRIN MIGRAINE) [phone] MG per tablet Take 2 tablets by mouth every 6 (six) hours as needed for headache.   Yes Historical Provider, MD  cyclobenzaprine (FLEXERIL) 10 MG tablet Take 1 tablet (10 mg total) by mouth 3 (three) times daily as needed. 07/01/14  Yes [name] Triplett, PA-C  loratadine (CLARITIN) 10 MG tablet Take 10 mg by mouth daily as needed for allergies.   Yes Historical Provider, MD  Multiple Vitamin (MULTIVITAMIN WITH MINERALS) TABS tablet Take 1 tablet by mouth daily.   Yes Historical Provider, MD  traMADol (ULTRAM) 50 MG tablet Take by mouth every 6 (six) hours as needed for moderate pain or severe pain.   Yes Historical Provider, MD  HYDROcodone-acetaminophen (NORCO/VICODIN) 5-325 MG per tablet Take 1 tablet by mouth every 6 (six) hours as needed for moderate pain. 11/30/14   Milton Ferguson, MD  ibuprofen (ADVIL,MOTRIN) 800 MG tablet  Take 1 tablet (800 mg total) by mouth 3 (three) times daily. Patient not taking: Reported on 11/30/2014 07/01/14   [name] Triplett, PA-C  metroNIDAZOLE (FLAGYL) 500 MG tablet Take 1 tablet (500 mg total) by mouth 2 (two) times daily. Patient not taking: Reported on 06/09/2014 03/03/14   Florian Buff, MD  nitrofurantoin, macrocrystal-monohydrate, (MACROBID) 100 MG capsule Take 1 capsule (100 mg total) by mouth 2 (two) times daily. Patient not taking: Reported on 07/01/2014 06/09/14   Tanna Furry, MD  phenazopyridine (PYRIDIUM) 200 MG tablet  Take 1 tablet (200 mg total) by mouth 3 (three) times daily. Patient not taking: Reported on 07/01/2014 06/09/14   Tanna Furry, MD  promethazine (PHENERGAN) 25 MG tablet Take 1 tablet (25 mg total) by mouth every 6 (six) hours as needed. Patient not taking: Reported on 06/09/2014 11/22/13   Nat Christen, MD   BP 111/71 mmHg  Pulse 81  Temp(Src) 99 F (37.2 C) (Oral)  Resp 18  Ht 5\' 5"  (1.651 m)  Wt 195 lb (88.451 kg)  BMI 32.45 kg/m2  SpO2 100%  LMP 11/11/2014 Physical Exam  Constitutional: She is oriented to person, place, and time. She appears well-developed.  HENT:  Head: Normocephalic.  Eyes: Conjunctivae and EOM are normal. No scleral icterus.  Neck: Neck supple. No thyromegaly present.  Cardiovascular: Normal rate and regular rhythm.  Exam reveals no gallop and no friction rub.   No murmur heard. Pulmonary/Chest: No stridor. She has no wheezes. She has no rales. She exhibits no tenderness.  Abdominal: She exhibits no distension. There is no tenderness. There is no rebound.  Musculoskeletal: Normal range of motion. She exhibits no edema.  Lymphadenopathy:    She has no cervical adenopathy.  Neurological: She is oriented to person, place, and time. She exhibits normal muscle tone. Coordination normal.  Skin: No rash noted. No erythema.  Psychiatric: She has a normal mood and affect. Her behavior is normal.    ED Course  Procedures (including critical care time) Labs Review Labs Reviewed  CBC WITH DIFFERENTIAL/PLATELET - Abnormal; Notable for the following:    WBC 3.0 (*)    Hemoglobin 10.7 (*)    HCT 32.4 (*)    All other components within normal limits  COMPREHENSIVE METABOLIC PANEL - Abnormal; Notable for the following:    ALT 13 (*)    All other components within normal limits    Imaging Review Ct Head Wo Contrast  11/30/2014   CLINICAL DATA:  Headache and dizziness for several weeks. Emesis and blurred vision.  EXAM: CT HEAD WITHOUT CONTRAST  TECHNIQUE: Contiguous  axial images were obtained from the base of the skull through the vertex without intravenous contrast.  COMPARISON:  None.  FINDINGS: Skull and Sinuses:Scarring in the right occipital scalp where there is a small subgaleal fatty mass, findings correlate with history of lipoma more removal. No fracture or destructive process.  Orbits: No acute abnormality.  Brain: No evidence of acute infarction, hemorrhage, hydrocephalus, or mass lesion/mass effect.  IMPRESSION: Negative head CT.   Electronically Signed   By: Monte Fantasia M.D.   On: 11/30/2014 18:34     EKG Interpretation None      MDM   Final diagnoses:  Headache behind the eyes    Headache,  tx with vicodin and follow up with pcp    Milton Ferguson, MD 11/30/14 1845

## 2014-11-30 NOTE — Progress Notes (Signed)
Pt removed her 2 hoop earrings herself held them in her hand and put them back in when CT was completed

## 2014-11-30 NOTE — ED Notes (Signed)
Pt reports headache since last night with dizziness and vomiting x2.  States she often gets blurred vision and dizziness with her migraines but has never vomited.  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  Pt is alert and oriented with no neuro deficit.

## 2014-11-30 NOTE — ED Notes (Addendum)
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 Pt reports headache and dizziness for last several weeks. Pt denies any light/sound sensitivity. Pt reports emesis and intermittent blurred vision today approx 1430. Pt alert and oriented. Speech clear. Face symmetrical. nad noted.

## 2014-11-30 NOTE — Discharge Instructions (Signed)
Follow up with your md in 1 week for recheck

## 2015-03-02 ENCOUNTER — Emergency Department (HOSPITAL_COMMUNITY): Payer: Self-pay

## 2015-03-02 ENCOUNTER — Encounter (HOSPITAL_COMMUNITY): Payer: Self-pay | Admitting: Cardiology

## 2015-03-02 ENCOUNTER — Emergency Department (HOSPITAL_COMMUNITY)
Admission: EM | Admit: 2015-03-02 | Discharge: 2015-03-02 | Disposition: A | Discharge status: Home / Self Care | Payer: Self-pay | Attending: Emergency Medicine | Admitting: Emergency Medicine

## 2015-03-02 DIAGNOSIS — Z3A01 Less than 8 weeks gestation of pregnancy: Secondary | ICD-10-CM | POA: Insufficient documentation

## 2015-03-02 DIAGNOSIS — R102 Pelvic and perineal pain: Secondary | ICD-10-CM | POA: Insufficient documentation

## 2015-03-02 DIAGNOSIS — Z79899 Other long term (current) drug therapy: Secondary | ICD-10-CM | POA: Insufficient documentation

## 2015-03-02 DIAGNOSIS — Z3490 Encounter for supervision of normal pregnancy, unspecified, unspecified trimester: Secondary | ICD-10-CM

## 2015-03-02 DIAGNOSIS — Z8619 Personal history of other infectious and parasitic diseases: Secondary | ICD-10-CM | POA: Insufficient documentation

## 2015-03-02 DIAGNOSIS — O9989 Other specified diseases and conditions complicating pregnancy, childbirth and the puerperium: Secondary | ICD-10-CM | POA: Insufficient documentation

## 2015-03-02 DIAGNOSIS — Z86018 Personal history of other benign neoplasm: Secondary | ICD-10-CM | POA: Insufficient documentation

## 2015-03-02 DIAGNOSIS — Z91040 Latex allergy status: Secondary | ICD-10-CM | POA: Insufficient documentation

## 2015-03-02 DIAGNOSIS — R1031 Right lower quadrant pain: Secondary | ICD-10-CM | POA: Insufficient documentation

## 2015-03-02 DIAGNOSIS — R1032 Left lower quadrant pain: Secondary | ICD-10-CM | POA: Insufficient documentation

## 2015-03-02 LAB — URINALYSIS, ROUTINE W REFLEX MICROSCOPIC
BILIRUBIN URINE: NEGATIVE
Glucose, UA: NEGATIVE mg/dL
KETONES UR: NEGATIVE mg/dL
Leukocytes, UA: NEGATIVE
NITRITE: NEGATIVE
PROTEIN: NEGATIVE mg/dL
Urobilinogen, UA: 0.2 mg/dL (ref 0.0–1.0)
pH: 6 (ref 5.0–8.0)

## 2015-03-02 LAB — CBC WITH DIFFERENTIAL/PLATELET
Basophils Absolute: 0 10*3/uL (ref 0.0–0.1)
Basophils Relative: 0 % (ref 0–1)
Eosinophils Absolute: 0.1 10*3/uL (ref 0.0–0.7)
Eosinophils Relative: 2 % (ref 0–5)
HCT: 31.1 % — ABNORMAL LOW (ref 36.0–46.0)
Hemoglobin: 10.1 g/dL — ABNORMAL LOW (ref 12.0–15.0)
LYMPHS ABS: 1.8 10*3/uL (ref 0.7–4.0)
LYMPHS PCT: 47 % — AB (ref 12–46)
MCH: 26.7 pg (ref 26.0–34.0)
MCHC: 32.5 g/dL (ref 30.0–36.0)
MCV: 82.3 fL (ref 78.0–100.0)
Monocytes Absolute: 0.2 10*3/uL (ref 0.1–1.0)
Monocytes Relative: 6 % (ref 3–12)
NEUTROS PCT: 45 % (ref 43–77)
Neutro Abs: 1.7 10*3/uL (ref 1.7–7.7)
Platelets: 224 10*3/uL (ref 150–400)
RBC: 3.78 MIL/uL — ABNORMAL LOW (ref 3.87–5.11)
RDW: 15.1 % (ref 11.5–15.5)
WBC: 3.8 10*3/uL — ABNORMAL LOW (ref 4.0–10.5)

## 2015-03-02 LAB — COMPREHENSIVE METABOLIC PANEL
ALK PHOS: 58 U/L (ref 38–126)
ALT: 13 U/L — ABNORMAL LOW (ref 14–54)
ANION GAP: 7 (ref 5–15)
AST: 16 U/L (ref 15–41)
Albumin: 3.7 g/dL (ref 3.5–5.0)
BUN: 6 mg/dL (ref 6–20)
CHLORIDE: 105 mmol/L (ref 101–111)
CO2: 25 mmol/L (ref 22–32)
CREATININE: 0.61 mg/dL (ref 0.44–1.00)
Calcium: 8.8 mg/dL — ABNORMAL LOW (ref 8.9–10.3)
GFR calc Af Amer: >60 mL/min (ref >60)
GFR calc non Af Amer: >60 mL/min (ref >60)
Glucose, Bld: 89 mg/dL (ref 65–99)
POTASSIUM: 3.7 mmol/L (ref 3.5–5.1)
SODIUM: 137 mmol/L (ref 135–145)
Total Bilirubin: 0.7 mg/dL (ref 0.3–1.2)
Total Protein: 7 g/dL (ref 6.5–8.1)

## 2015-03-02 LAB — URINE MICROSCOPIC-ADD ON

## 2015-03-02 LAB — LIPASE, BLOOD: Lipase: 14 U/L — ABNORMAL LOW (ref 22–51)

## 2015-03-02 LAB — HCG, QUANTITATIVE, PREGNANCY: hCG, Beta Chain, Quant, S: 5585 m[IU]/mL — ABNORMAL HIGH (ref <5)

## 2015-03-02 LAB — PREGNANCY, URINE: Preg Test, Ur: POSITIVE — AB

## 2015-03-02 MED ORDER — FENTANYL CITRATE (PF) 100 MCG/2ML IJ SOLN
50.0000 ug | Freq: Once | INTRAMUSCULAR | Status: AC
  Administered 2015-03-02: 50 ug via INTRAVENOUS
  Filled 2015-03-02: qty 2

## 2015-03-02 MED ORDER — ONDANSETRON HCL 4 MG/2ML IJ SOLN
4.0000 mg | Freq: Once | INTRAMUSCULAR | Status: AC
Start: 2015-03-02 — End: 2015-03-02
  Administered 2015-03-02: 4 mg via INTRAVENOUS
  Filled 2015-03-02: qty 2

## 2015-03-02 NOTE — ED Notes (Signed)
Patient with no complaints at this time. Respirations even and unlabored. Skin warm/dry. Discharge instructions reviewed with patient at this time. Patient given opportunity to voice concerns/ask questions. IV removed per policy and band-aid applied to site. Patient discharged at this time and left Emergency Department with steady gait.  

## 2015-03-02 NOTE — Discharge Instructions (Signed)
Your pregnancy test is positive, and your ultrasound reveals an intrauterine pregnancy. Please see your GYN physician as soon as possible. First Trimester of Pregnancy The first trimester of pregnancy is from week 1 until the end of week 12 (months 1 through 3). During this time, your baby will begin to develop inside you. At 6-8 weeks, the eyes and face are formed, and the heartbeat can be seen on ultrasound. At the end of 12 weeks, all the baby's organs are formed. Prenatal care is all the medical care you receive before the birth of your baby. Make sure you get good prenatal care and follow all of your doctor's instructions. HOME CARE  Medicines  Take medicine only as told by your doctor. Some medicines are safe and some are not during pregnancy.  Take your prenatal vitamins as told by your doctor.  Take medicine that helps you poop (stool softener) as needed if your doctor says it is okay. Diet  Eat regular, healthy meals.  Your doctor will tell you the amount of weight gain that is right for you.  Avoid raw meat and uncooked cheese.  If you feel sick to your stomach (nauseous) or throw up (vomit):  Eat 4 or 5 small meals a day instead of 3 large meals.  Try eating a few soda crackers.  Drink liquids between meals instead of during meals.  If you have a hard time pooping (constipation):  Eat high-fiber foods like fresh vegetables, fruit, and whole grains.  Drink enough fluids to keep your pee (urine) clear or pale yellow. Activity and Exercise  Exercise only as told by your doctor. Stop exercising if you have cramps or pain in your lower belly (abdomen) or low back.  Try to avoid standing for long periods of time. Move your legs often if you must stand in one place for a long time.  Avoid heavy lifting.  Wear low-heeled shoes. Sit and stand up straight.  You can have sex unless your doctor tells you not to. Relief of Pain or Discomfort  Wear a good support bra if your  breasts are sore.  Take warm water baths (sitz baths) to soothe pain or discomfort caused by hemorrhoids. Use hemorrhoid cream if your doctor says it is okay.  Rest with your legs raised if you have leg cramps or low back pain.  Wear support hose if you have puffy, bulging veins (varicose veins) in your legs. Raise (elevate) your feet for 15 minutes, 3-4 times a day. Limit salt in your diet. Prenatal Care  Schedule your prenatal visits by the twelfth week of pregnancy.  Write down your questions. Take them to your prenatal visits.  Keep all your prenatal visits as told by your doctor. Safety  Wear your seat belt at all times when driving.  Make a list of emergency phone numbers. The list should include numbers for family, friends, the hospital, and police and fire departments. General Tips  Ask your doctor for a referral to a local prenatal class. Begin classes no later than at the start of month 6 of your pregnancy.  Ask for help if you need counseling or help with nutrition. Your doctor can give you advice or tell you where to go for help.  Do not use hot tubs, steam rooms, or saunas.  Do not douche or use tampons or scented sanitary pads.  Do not cross your legs for long periods of time.  Avoid litter boxes and soil used by cats.  Avoid  all smoking, herbs, and alcohol. Avoid drugs not approved by your doctor.  Visit your dentist. At home, brush your teeth with a soft toothbrush. Be gentle when you floss. GET HELP IF:  You are dizzy.  You have mild cramps or pressure in your lower belly.  You have a nagging pain in your belly area.  You continue to feel sick to your stomach, throw up, or have watery poop (diarrhea).  You have a bad smelling fluid coming from your vagina.  You have pain with peeing (urination).  You have increased puffiness (swelling) in your face, hands, legs, or ankles. GET HELP RIGHT AWAY IF:   You have a fever.  You are leaking fluid from  your vagina.  You have spotting or bleeding from your vagina.  You have very bad belly cramping or pain.  You gain or lose weight rapidly.  You throw up blood. It may look like coffee grounds.  You are around people who have [place] measles, fifth disease, or chickenpox.  You have a very bad headache.  You have shortness of breath.  You have any kind of trauma, such as from a fall or a car accident. Document Released: 01/03/2008 Document Revised: 12/01/2013 Document Reviewed: 05/27/2013 [facility] Patient Information 2015 [place], [place]. This information is not intended to replace advice given to you by your health care provider. Make sure you discuss any questions you have with your health care provider.

## 2015-03-02 NOTE — ED Provider Notes (Signed)
CSN: [id number]     Arrival date & time 03/02/15  0804 History   First MD Initiated Contact with Patient 03/02/15 0804     No chief complaint on file.    (Consider location/radiation/quality/duration/timing/severity/associated sxs/prior Treatment) HPI Comments: Patient is a 24 year old female who presents to the emergency department with a complaint of abdominal pain, abdominal cramping, and bloating. The patient also complains of some pain with urination.  This problem started 4 days ago. Patient states she has not had any trauma, or changes in her exercise routine or any injury to the lower abdomen and pelvis area. She describes the pain as coming and going. The pain is better when she is lying down or sleeping. Nothing seems to change the pain is far as it being worse. The pain is located in the right and the left lower abdomen area.  The last bowel movement was yesterday August 1. Patient states that her pain is similar to when she had a cyst on her right ovary in the past. The patient is gravida 1 para 1 abortus 0, the last menstrual cycle was July 1.  The history is provided by the patient.    Past Medical History  Diagnosis Date  . Ovarian cyst, right   . Abnormal Pap smear   . Hx of chlamydia infection   . Headache(784.0)   . Uterine fibroid    Past Surgical History  Procedure Laterality Date  . No past surgeries    . Colposcopy    . Lipoma surgery     Family History  Problem Relation Age of Onset  . Diabetes Father   . Hypertension Father   . Cancer Maternal Grandmother     lung  . Heart disease Paternal Grandmother   . Other Son     reactive airway disease   History  Substance Use Topics  . Smoking status: Never Smoker   . Smokeless tobacco: Never Used  . Alcohol Use: Yes     Comment: occ   OB History    Gravida Para Term Preterm AB TAB SAB Ectopic Multiple Living   1 1 1  0 0 0 0 0 0 1     Review of Systems  Constitutional: Negative for activity change.        All ROS Neg except as noted in HPI  HENT: Negative for nosebleeds.   Eyes: Negative for photophobia and discharge.  Respiratory: Negative for cough, shortness of breath and wheezing.   Cardiovascular: Negative for chest pain and palpitations.  Gastrointestinal: Negative for nausea, vomiting, abdominal pain and blood in stool.  Genitourinary: Positive for pelvic pain. Negative for dysuria, urgency, frequency, hematuria and vaginal bleeding.  Musculoskeletal: Negative for back pain, arthralgias and neck pain.  Skin: Negative.   Neurological: Negative for dizziness, seizures and speech difficulty.  Psychiatric/Behavioral: Negative for hallucinations and confusion.      Allergies  Latex; Other; and Pollen extract  Home Medications   Prior to Admission medications   Medication Sig Start Date End Date Taking? Authorizing Provider  acetaminophen (TYLENOL) 500 MG tablet Take 500 mg by mouth every 6 (six) hours as needed for mild pain or moderate pain.    Historical Provider, MD  aspirin-acetaminophen-caffeine (EXCEDRIN MIGRAINE) [phone] MG per tablet Take 2 tablets by mouth every 6 (six) hours as needed for headache.    Historical Provider, MD  cyclobenzaprine (FLEXERIL) 10 MG tablet Take 1 tablet (10 mg total) by mouth 3 (three) times daily as needed. 07/01/14  [name] Triplett, PA-C  HYDROcodone-acetaminophen (NORCO/VICODIN) 5-325 MG per tablet Take 1 tablet by mouth every 6 (six) hours as needed for moderate pain. 11/30/14   Milton Ferguson, MD  ibuprofen (ADVIL,MOTRIN) 800 MG tablet Take 1 tablet (800 mg total) by mouth 3 (three) times daily. Patient not taking: Reported on 11/30/2014 07/01/14   [name] Triplett, PA-C  loratadine (CLARITIN) 10 MG tablet Take 10 mg by mouth daily as needed for allergies.    Historical Provider, MD  metroNIDAZOLE (FLAGYL) 500 MG tablet Take 1 tablet (500 mg total) by mouth 2 (two) times daily. Patient not taking: Reported on 06/09/2014 03/03/14   Florian Buff,  MD  Multiple Vitamin (MULTIVITAMIN WITH MINERALS) TABS tablet Take 1 tablet by mouth daily.    Historical Provider, MD  nitrofurantoin, macrocrystal-monohydrate, (MACROBID) 100 MG capsule Take 1 capsule (100 mg total) by mouth 2 (two) times daily. Patient not taking: Reported on 07/01/2014 06/09/14   Tanna Furry, MD  phenazopyridine (PYRIDIUM) 200 MG tablet Take 1 tablet (200 mg total) by mouth 3 (three) times daily. Patient not taking: Reported on 07/01/2014 06/09/14   Tanna Furry, MD  promethazine (PHENERGAN) 25 MG tablet Take 1 tablet (25 mg total) by mouth every 6 (six) hours as needed. Patient not taking: Reported on 06/09/2014 11/22/13   Nat Christen, MD  traMADol (ULTRAM) 50 MG tablet Take by mouth every 6 (six) hours as needed for moderate pain or severe pain.    Historical Provider, MD   There were no vitals taken for this visit. Physical Exam  Constitutional: She is oriented to person, place, and time. She appears well-developed and well-nourished.  Non-toxic appearance.  HENT:  Head: Normocephalic.  Right Ear: Tympanic membrane and external ear normal.  Left Ear: Tympanic membrane and external ear normal.  Eyes: EOM and lids are normal. Pupils are equal, round, and reactive to light.  Neck: Normal range of motion. Neck supple. Carotid bruit is not present.  Cardiovascular: Normal rate, regular rhythm, normal heart sounds, intact distal pulses and normal pulses.   Pulmonary/Chest: Breath sounds normal. No respiratory distress.  Abdominal: Soft. Bowel sounds are normal. She exhibits no distension, no ascites, no pulsatile midline mass and no mass. There is tenderness in the right lower quadrant, suprapubic area and left lower quadrant. There is no rebound, no guarding, no CVA tenderness and no tenderness at McBurney's point.  Musculoskeletal: Normal range of motion.  Lymphadenopathy:       Head (right side): No submandibular adenopathy present.       Head (left side): No submandibular  adenopathy present.    She has no cervical adenopathy.  Neurological: She is alert and oriented to person, place, and time. She has normal strength. No cranial nerve deficit or sensory deficit.  Skin: Skin is warm and dry.  Psychiatric: She has a normal mood and affect. Her speech is normal.  Nursing note and vitals reviewed.   ED Course  Procedures (including critical care time) Labs Review Labs Reviewed - No data to display  Imaging Review No results found.   EKG Interpretation None      MDM Vital signs are well within normal limits.  Urine pregnancy test is positive. Urinalysis shows a cloudy yellow specimen with a specific gravity greater than 1.030, otherwise urine is nonacute. Complete blood count shows the hemoglobin to be low at 10.1 hematocrit low at 31.1 otherwise no significant changes.  Ultrasound of the pelvis reveals single intra uterine gestation with normal appearing gestational sac.  Gestational age [redacted]weeks 3days. Pt to follow up with Gyn in one or two weeks.   Final diagnoses:  None    **I have reviewed nursing notes, vital signs, and all appropriate lab and imaging results for this patient.Lily Kocher, PA-C 03/04/15 1802  Elnora Morrison, MD 03/08/15 [phone]

## 2015-03-02 NOTE — ED Notes (Signed)
Abdominal cramping and bloating times one week. Pain with urination

## 2015-04-07 ENCOUNTER — Other Ambulatory Visit: Payer: Self-pay | Admitting: Obstetrics and Gynecology

## 2015-04-07 DIAGNOSIS — O3680X0 Pregnancy with inconclusive fetal viability, not applicable or unspecified: Secondary | ICD-10-CM

## 2015-04-08 ENCOUNTER — Ambulatory Visit (INDEPENDENT_AMBULATORY_CARE_PROVIDER_SITE_OTHER): Payer: 59

## 2015-04-08 DIAGNOSIS — O3680X0 Pregnancy with inconclusive fetal viability, not applicable or unspecified: Secondary | ICD-10-CM | POA: Diagnosis not present

## 2015-04-08 NOTE — Progress Notes (Signed)
[place] 10+6wks single IUP,pos fht 163 bpm,normal ov's bilat,crl 41.62mm,3.5 x 3.4 x 2.1cm subchorionic hemorrhage

## 2015-04-19 ENCOUNTER — Encounter: Payer: Self-pay | Admitting: Women's Health

## 2015-04-19 ENCOUNTER — Ambulatory Visit (INDEPENDENT_AMBULATORY_CARE_PROVIDER_SITE_OTHER): Payer: 59 | Admitting: Women's Health

## 2015-04-19 ENCOUNTER — Other Ambulatory Visit (HOSPITAL_COMMUNITY)
Admission: RE | Admit: 2015-04-19 | Discharge: 2015-04-19 | Disposition: A | Discharge status: Home / Self Care | Payer: 59 | Source: Ambulatory Visit | Attending: Obstetrics & Gynecology | Admitting: Obstetrics & Gynecology

## 2015-04-19 VITALS — BP 110/60 | HR 76 | Wt 194.0 lb

## 2015-04-19 DIAGNOSIS — K5900 Constipation, unspecified: Secondary | ICD-10-CM

## 2015-04-19 DIAGNOSIS — Z3491 Encounter for supervision of normal pregnancy, unspecified, first trimester: Secondary | ICD-10-CM

## 2015-04-19 DIAGNOSIS — Z01411 Encounter for gynecological examination (general) (routine) with abnormal findings: Secondary | ICD-10-CM | POA: Diagnosis not present

## 2015-04-19 DIAGNOSIS — Z3490 Encounter for supervision of normal pregnancy, unspecified, unspecified trimester: Secondary | ICD-10-CM | POA: Insufficient documentation

## 2015-04-19 DIAGNOSIS — O418X10 Other specified disorders of amniotic fluid and membranes, first trimester, not applicable or unspecified: Secondary | ICD-10-CM

## 2015-04-19 DIAGNOSIS — O468X1 Other antepartum hemorrhage, first trimester: Secondary | ICD-10-CM

## 2015-04-19 DIAGNOSIS — Z369 Encounter for antenatal screening, unspecified: Secondary | ICD-10-CM

## 2015-04-19 DIAGNOSIS — R87619 Unspecified abnormal cytological findings in specimens from cervix uteri: Secondary | ICD-10-CM | POA: Insufficient documentation

## 2015-04-19 DIAGNOSIS — Z331 Pregnant state, incidental: Secondary | ICD-10-CM

## 2015-04-19 DIAGNOSIS — Z3682 Encounter for antenatal screening for nuchal translucency: Secondary | ICD-10-CM

## 2015-04-19 DIAGNOSIS — O99611 Diseases of the digestive system complicating pregnancy, first trimester: Secondary | ICD-10-CM

## 2015-04-19 DIAGNOSIS — Z113 Encounter for screening for infections with a predominantly sexual mode of transmission: Secondary | ICD-10-CM | POA: Diagnosis present

## 2015-04-19 DIAGNOSIS — Z1389 Encounter for screening for other disorder: Secondary | ICD-10-CM

## 2015-04-19 NOTE — Progress Notes (Signed)
Pt states that she has noticed some bleeding after sex.

## 2015-04-19 NOTE — Progress Notes (Addendum)
  Subjective:  Emily Morales is a 24 y.o. G80P1001 African American female at [redacted]w[redacted]d by 10wk u/s, being seen today for her first obstetrical visit.  Her obstetrical history is significant for term uncomplicated svb x 1.  [facility] noted at [place] this pregnancy, 3.5x3.4x2.1cm. Pregnancy history fully reviewed.  Patient reports some cramping, spotting w/ sex x 1, constipation- was taking miralax prior to pregnancy, n/v has resolved but was bad earlier in pregnancy. Denies uti s/s, abnormal/malodorous vag d/c, or vulvovaginal itching/irritation.  BP 110/60 mmHg  Pulse 76  Wt 194 lb (87.998 kg)  LMP 01/29/2015 (Exact Date)  HISTORY: OB History  Gravida Para Term Preterm AB SAB TAB Ectopic Multiple Living  2 1 1  0 0 0 0 0 0 1    # Outcome Date GA Lbr Len/2nd Weight Sex Delivery Anes PTL Lv  2 Current           1 Term 04/25/12 [redacted]w[redacted]d 13:20 / 00:16 7 lb 12.7 oz (3.535 kg) M Vag-Spont EPI  Y     Comments: WNL     Past Medical History  Diagnosis Date  . Ovarian cyst, right   . Abnormal Pap smear   . Hx of chlamydia infection   . Headache(784.0)   . Uterine fibroid    Past Surgical History  Procedure Laterality Date  . No past surgeries    . Colposcopy    . Lipoma surgery     Family History  Problem Relation Age of Onset  . Diabetes Father   . Hypertension Father   . Cancer Maternal Grandmother     lung  . Heart disease Paternal Grandmother   . Other Son     reactive airway disease  . Asthma Son   . Heart murmur Son     Exam   System:     General: Well developed & nourished, no acute distress   Skin: Warm & dry, normal coloration and turgor, no rashes   Neurologic: Alert & oriented, normal mood   Cardiovascular: Regular rate & rhythm   Respiratory: Effort & rate normal, LCTAB, acyanotic   Abdomen: Soft, non tender   Extremities: normal strength, tone   Pelvic Exam:    Perineum: Normal perineum   Vulva: Normal, no lesions   Vagina:  Normal mucosa, normal discharge    Cervix: Normal, bulbous, appears closed   Uterus: Normal size/shape/contour for GA   Thin prep pap smear obtained w/ reflex high risk HPV cotesting FHR: 158 via doppler   Assessment:   Pregnancy: G2P1001 Patient Active Problem List   Diagnosis Date Noted  . Supervision of normal pregnancy 04/19/2015    Priority: High  . Abnormal Pap smear of cervix 04/19/2015  . Mild dysplasia of cervix 03/26/2014    [redacted]w[redacted]d G2P1001 New OB visit Constipation Cramping [place] H/O abnormal pap  Plan:  Initial labs drawn Continue prenatal vitamins Problem list reviewed and updated Reviewed n/v relief measures and warning s/s to report Reviewed constipation prevention/relief measures Discussed [facility], reasons to seek care Reviewed recommended weight gain based on pre-gravid BMI Encouraged well-balanced diet Genetic Screening discussed Integrated Screen: requested Cystic fibrosis screening discussed declined Ultrasound discussed; fetal survey: requested Follow up in asap for 1st it/nt (no visit), then 4 weeks for visit and 2nd IT CCNC not completed, has already tried to sign up for pregnancy [place]- was told didn't qualify  Tawnya Crook CNM, [facility] 04/19/2015 9:40 AM

## 2015-04-19 NOTE — Patient Instructions (Signed)

## 2015-04-20 ENCOUNTER — Other Ambulatory Visit: Payer: 59

## 2015-04-20 ENCOUNTER — Ambulatory Visit (INDEPENDENT_AMBULATORY_CARE_PROVIDER_SITE_OTHER): Payer: 59

## 2015-04-20 ENCOUNTER — Other Ambulatory Visit: Payer: Self-pay | Admitting: Women's Health

## 2015-04-20 ENCOUNTER — Encounter: Payer: Self-pay | Admitting: Women's Health

## 2015-04-20 DIAGNOSIS — O468X1 Other antepartum hemorrhage, first trimester: Secondary | ICD-10-CM

## 2015-04-20 DIAGNOSIS — Z3682 Encounter for antenatal screening for nuchal translucency: Secondary | ICD-10-CM

## 2015-04-20 DIAGNOSIS — O43891 Other placental disorders, first trimester: Secondary | ICD-10-CM

## 2015-04-20 DIAGNOSIS — Z36 Encounter for antenatal screening of mother: Secondary | ICD-10-CM

## 2015-04-20 DIAGNOSIS — Z3491 Encounter for supervision of normal pregnancy, unspecified, first trimester: Secondary | ICD-10-CM

## 2015-04-20 DIAGNOSIS — O418X10 Other specified disorders of amniotic fluid and membranes, first trimester, not applicable or unspecified: Secondary | ICD-10-CM

## 2015-04-20 DIAGNOSIS — Z369 Encounter for antenatal screening, unspecified: Secondary | ICD-10-CM

## 2015-04-20 LAB — URINE CULTURE

## 2015-04-20 LAB — CYTOLOGY - PAP

## 2015-04-20 MED ORDER — FERROUS SULFATE 325 (65 FE) MG PO TABS: 325.0000 mg | ORAL_TABLET | Freq: Two times a day (BID) | ORAL | Status: DC

## 2015-04-20 NOTE — Progress Notes (Signed)
[place] 12+4wks single IUP pos FHT 155 bpm,normal ov's bilat,CRL 62.54mm,NT 1.46mm,NB present,subchorionic hemorrhage 3.5 x 3.2 x 1.6cm N/C

## 2015-04-21 LAB — URINALYSIS, ROUTINE W REFLEX MICROSCOPIC
Bilirubin, UA: NEGATIVE
Glucose, UA: NEGATIVE
Ketones, UA: NEGATIVE
Leukocytes, UA: NEGATIVE
Nitrite, UA: NEGATIVE
Protein, UA: NEGATIVE
RBC, UA: NEGATIVE
Specific Gravity, UA: 1.021 (ref 1.005–1.030)
Urobilinogen, Ur: 1 mg/dL (ref 0.2–1.0)
pH, UA: 7 (ref 5.0–7.5)

## 2015-04-21 LAB — PMP SCREEN PROFILE (10S), URINE
Amphetamine Screen, Ur: NEGATIVE ng/mL
BENZODIAZEPINE SCREEN, URINE: NEGATIVE ng/mL
Barbiturate Screen, Ur: NEGATIVE ng/mL
COCAINE(METAB.) SCREEN, URINE: NEGATIVE ng/mL
Cannabinoids Ur Ql Scn: NEGATIVE ng/mL
Creatinine(Crt), U: 196.2 mg/dL (ref 20.0–300.0)
Methadone Scn, Ur: NEGATIVE ng/mL
Opiate Scrn, Ur: NEGATIVE ng/mL
Oxycodone+Oxymorphone Ur Ql Scn: NEGATIVE ng/mL
PCP SCRN UR: NEGATIVE ng/mL
Ph of Urine: 7 (ref 4.5–8.9)
Propoxyphene, Screen: NEGATIVE ng/mL

## 2015-04-21 LAB — CBC
Hematocrit: 32.1 % — ABNORMAL LOW (ref 34.0–46.6)
Hemoglobin: 10.8 g/dL — ABNORMAL LOW (ref 11.1–15.9)
MCH: 27.8 pg (ref 26.6–33.0)
MCHC: 33.6 g/dL (ref 31.5–35.7)
MCV: 83 fL (ref 79–97)
Platelets: 233 x10E3/uL (ref 150–379)
RBC: 3.89 x10E6/uL (ref 3.77–5.28)
RDW: 16.2 % — ABNORMAL HIGH (ref 12.3–15.4)
WBC: 5.2 x10E3/uL (ref 3.4–10.8)

## 2015-04-21 LAB — ANTIBODY SCREEN: Antibody Screen: NEGATIVE

## 2015-04-21 LAB — VARICELLA ZOSTER ANTIBODY, IGG: Varicella zoster IgG: 658 {index}

## 2015-04-21 LAB — SICKLE CELL SCREEN: Sickle Cell Screen: NEGATIVE

## 2015-04-21 LAB — SYPHILIS: RPR W/REFLEX TO RPR TITER AND TREPONEMAL ANTIBODIES, TRADITIONAL SCREENING AND DIAGNOSIS ALGORITHM: RPR Ser Ql: NONREACTIVE

## 2015-04-21 LAB — RUBELLA SCREEN: Rubella Antibodies, IGG: 24.4 {index}

## 2015-04-21 LAB — HIV ANTIBODY (ROUTINE TESTING W REFLEX): HIV Screen 4th Generation wRfx: NONREACTIVE

## 2015-04-21 LAB — ABO/RH: Rh Factor: POSITIVE

## 2015-04-21 LAB — HEPATITIS B SURFACE ANTIGEN: Hepatitis B Surface Ag: NEGATIVE

## 2015-04-22 LAB — MATERNAL SCREEN, INTEGRATED #1
CROWN RUMP LENGTH MAT SCREEN: 62.4 mm
GEST. AGE ON COLLECTION DATE: 12.6 wk
Maternal Age at EDD: 26.8 years
Nuchal Translucency (NT): 1.3 mm
Number of Fetuses: 1
PAPP-A VALUE: 1269.6 ng/mL
WEIGHT: 194 [lb_av]

## 2015-05-03 ENCOUNTER — Telehealth: Payer: Self-pay | Admitting: Women's Health

## 2015-05-03 NOTE — Telephone Encounter (Signed)
Spoke with pt. Pt is having shooting pains in the top of her stomach and in right side. No spotting. Pt has a scheduled appt on 05/17/15. I advised it sounds like round ligament pain and explained that as the baby grows, the body stretches and that causes pain. Advised to take Tylenol as needed and let us know if pain got worse or if she started with any new symptoms. Pt voiced understanding. Lupus

## 2015-05-09 ENCOUNTER — Emergency Department (HOSPITAL_COMMUNITY)
Admission: EM | Admit: 2015-05-09 | Discharge: 2015-05-09 | Disposition: A | Discharge status: Home / Self Care | Payer: 59 | Attending: Emergency Medicine | Admitting: Emergency Medicine

## 2015-05-09 ENCOUNTER — Encounter (HOSPITAL_COMMUNITY): Payer: Self-pay

## 2015-05-09 DIAGNOSIS — Z79899 Other long term (current) drug therapy: Secondary | ICD-10-CM | POA: Insufficient documentation

## 2015-05-09 DIAGNOSIS — R1084 Generalized abdominal pain: Secondary | ICD-10-CM | POA: Diagnosis not present

## 2015-05-09 DIAGNOSIS — Z8742 Personal history of other diseases of the female genital tract: Secondary | ICD-10-CM | POA: Diagnosis not present

## 2015-05-09 DIAGNOSIS — Z8619 Personal history of other infectious and parasitic diseases: Secondary | ICD-10-CM | POA: Insufficient documentation

## 2015-05-09 DIAGNOSIS — Z86018 Personal history of other benign neoplasm: Secondary | ICD-10-CM | POA: Insufficient documentation

## 2015-05-09 DIAGNOSIS — R109 Unspecified abdominal pain: Secondary | ICD-10-CM

## 2015-05-09 DIAGNOSIS — Z91040 Latex allergy status: Secondary | ICD-10-CM | POA: Diagnosis not present

## 2015-05-09 DIAGNOSIS — O26899 Other specified pregnancy related conditions, unspecified trimester: Secondary | ICD-10-CM

## 2015-05-09 DIAGNOSIS — R079 Chest pain, unspecified: Secondary | ICD-10-CM | POA: Diagnosis not present

## 2015-05-09 DIAGNOSIS — Z3A15 15 weeks gestation of pregnancy: Secondary | ICD-10-CM | POA: Diagnosis not present

## 2015-05-09 DIAGNOSIS — O9989 Other specified diseases and conditions complicating pregnancy, childbirth and the puerperium: Secondary | ICD-10-CM | POA: Insufficient documentation

## 2015-05-09 LAB — CBC WITH DIFFERENTIAL/PLATELET
Basophils Absolute: 0 10*3/uL (ref 0.0–0.1)
Basophils Relative: 0 %
EOS ABS: 0.1 10*3/uL (ref 0.0–0.7)
EOS PCT: 1 %
HCT: 28 % — ABNORMAL LOW (ref 36.0–46.0)
HEMOGLOBIN: 9.7 g/dL — AB (ref 12.0–15.0)
LYMPHS ABS: 1.8 10*3/uL (ref 0.7–4.0)
LYMPHS PCT: 33 %
MCH: 28.4 pg (ref 26.0–34.0)
MCHC: 34.6 g/dL (ref 30.0–36.0)
MCV: 82.1 fL (ref 78.0–100.0)
MONOS PCT: 6 %
Monocytes Absolute: 0.3 10*3/uL (ref 0.1–1.0)
Neutro Abs: 3.3 10*3/uL (ref 1.7–7.7)
Neutrophils Relative %: 60 %
PLATELETS: 177 10*3/uL (ref 150–400)
RBC: 3.41 MIL/uL — AB (ref 3.87–5.11)
RDW: 15.1 % (ref 11.5–15.5)
WBC: 5.5 10*3/uL (ref 4.0–10.5)

## 2015-05-09 LAB — URINALYSIS, ROUTINE W REFLEX MICROSCOPIC
BILIRUBIN URINE: NEGATIVE
Glucose, UA: NEGATIVE mg/dL
Ketones, ur: NEGATIVE mg/dL
Leukocytes, UA: NEGATIVE
Nitrite: NEGATIVE
PH: 6 (ref 5.0–8.0)
Protein, ur: NEGATIVE mg/dL
SPECIFIC GRAVITY, URINE: 1.02 (ref 1.005–1.030)
UROBILINOGEN UA: 0.2 mg/dL (ref 0.0–1.0)

## 2015-05-09 LAB — COMPREHENSIVE METABOLIC PANEL
ALK PHOS: 50 U/L (ref 38–126)
ALT: 12 U/L — AB (ref 14–54)
ANION GAP: 7 (ref 5–15)
AST: 17 U/L (ref 15–41)
Albumin: 3.3 g/dL — ABNORMAL LOW (ref 3.5–5.0)
BUN: 8 mg/dL (ref 6–20)
CALCIUM: 8.7 mg/dL — AB (ref 8.9–10.3)
CO2: 20 mmol/L — ABNORMAL LOW (ref 22–32)
CREATININE: 0.41 mg/dL — AB (ref 0.44–1.00)
Chloride: 106 mmol/L (ref 101–111)
Glucose, Bld: 90 mg/dL (ref 65–99)
Potassium: 3.6 mmol/L (ref 3.5–5.1)
Sodium: 133 mmol/L — ABNORMAL LOW (ref 135–145)
Total Bilirubin: 0.2 mg/dL — ABNORMAL LOW (ref 0.3–1.2)
Total Protein: 7 g/dL (ref 6.5–8.1)

## 2015-05-09 LAB — LIPASE, BLOOD: LIPASE: 19 U/L — AB (ref 22–51)

## 2015-05-09 LAB — URINE MICROSCOPIC-ADD ON

## 2015-05-09 MED ORDER — ONDANSETRON HCL 4 MG/2ML IJ SOLN
4.0000 mg | Freq: Once | INTRAMUSCULAR | Status: AC
  Administered 2015-05-09: 4 mg via INTRAVENOUS
  Filled 2015-05-09: qty 2

## 2015-05-09 MED ORDER — MORPHINE SULFATE (PF) 4 MG/ML IV SOLN
4.0000 mg | INTRAVENOUS | Status: DC | PRN
  Administered 2015-05-09: 4 mg via INTRAVENOUS
  Filled 2015-05-09: qty 1

## 2015-05-09 MED ORDER — SODIUM CHLORIDE 0.9 % IV BOLUS (SEPSIS)
1000.0000 mL | Freq: Once | INTRAVENOUS | Status: AC
  Administered 2015-05-09: 1000 mL via INTRAVENOUS

## 2015-05-09 NOTE — ED Notes (Signed)
Patient is having abdominal pain, [redacted] weeks pregnant. Denies vaginal bleeding. Patient has a subchorionic hemorrhage. [facility] the [facility] that she sees at this time.

## 2015-05-09 NOTE — Discharge Instructions (Signed)
Stayy hydrated, push fluids.  Antacids over the counter as needed.    Tylenol.  Recheck with OB tomorrow if symptoms worsen or recur.

## 2015-05-09 NOTE — ED Notes (Signed)
Dr. Jeneen Rinks at bedside with portable ultrasound. Fetal Rate of 130 BPM noted.

## 2015-05-09 NOTE — ED Provider Notes (Signed)
Patient left the changes shift to get results of her blood work. She states for the past 2 weeks she's been having bilateral upper abdominal pain that comes and goes. She also complains of heartburn but does not relate the 2 events together. Patient states she is [redacted] weeks pregnant with her second child and did not have these symptoms with her first pregnancy. Patient was given her test results which were normal. She states she is feeling better after treatment she has received in the ED. She has an appointment with her OB in 1 week. She was advised to contact them this morning and let them know she came to the ED.  Results for orders placed or performed during the hospital encounter of 05/09/15  Urinalysis, Routine w reflex microscopic (not at [facility])  Result Value Ref Range   Color, Urine YELLOW YELLOW   APPearance CLEAR CLEAR   Specific Gravity, Urine 1.020 1.005 - 1.030   pH 6.0 5.0 - 8.0   Glucose, UA NEGATIVE NEGATIVE mg/dL   Hgb urine dipstick SMALL (A) NEGATIVE   Bilirubin Urine NEGATIVE NEGATIVE   Ketones, ur NEGATIVE NEGATIVE mg/dL   Protein, ur NEGATIVE NEGATIVE mg/dL   Urobilinogen, UA 0.2 0.0 - 1.0 mg/dL   Nitrite NEGATIVE NEGATIVE   Leukocytes, UA NEGATIVE NEGATIVE  CBC with Differential/Platelet  Result Value Ref Range   WBC 5.5 4.0 - 10.5 K/uL   RBC 3.41 (L) 3.87 - 5.11 MIL/uL   Hemoglobin 9.7 (L) 12.0 - 15.0 g/dL   HCT 28.0 (L) 36.0 - 46.0 %   MCV 82.1 78.0 - 100.0 fL   MCH 28.4 26.0 - 34.0 pg   MCHC 34.6 30.0 - 36.0 g/dL   RDW 15.1 11.5 - 15.5 %   Platelets 177 150 - 400 K/uL   Neutrophils Relative % 60 %   Neutro Abs 3.3 1.7 - 7.7 K/uL   Lymphocytes Relative 33 %   Lymphs Abs 1.8 0.7 - 4.0 K/uL   Monocytes Relative 6 %   Monocytes Absolute 0.3 0.1 - 1.0 K/uL   Eosinophils Relative 1 %   Eosinophils Absolute 0.1 0.0 - 0.7 K/uL   Basophils Relative 0 %   Basophils Absolute 0.0 0.0 - 0.1 K/uL  Comprehensive metabolic panel  Result Value Ref Range   Sodium 133 (L)  135 - 145 mmol/L   Potassium 3.6 3.5 - 5.1 mmol/L   Chloride 106 101 - 111 mmol/L   CO2 20 (L) 22 - 32 mmol/L   Glucose, Bld 90 65 - 99 mg/dL   BUN 8 6 - 20 mg/dL   Creatinine, Ser 0.41 (L) 0.44 - 1.00 mg/dL   Calcium 8.7 (L) 8.9 - 10.3 mg/dL   Total Protein 7.0 6.5 - 8.1 g/dL   Albumin 3.3 (L) 3.5 - 5.0 g/dL   AST 17 15 - 41 U/L   ALT 12 (L) 14 - 54 U/L   Alkaline Phosphatase 50 38 - 126 U/L   Total Bilirubin 0.2 (L) 0.3 - 1.2 mg/dL   GFR calc non Af Amer >60 >60 mL/min   GFR calc Af Amer >60 >60 mL/min   Anion gap 7 5 - 15  Lipase, blood  Result Value Ref Range   Lipase 19 (L) 22 - 51 U/L  Urine microscopic-add on  Result Value Ref Range   Squamous Epithelial / LPF MANY (A) RARE   WBC, UA 3-6 <3 WBC/hpf   RBC / HPF 3-6 <3 RBC/hpf   Bacteria, UA MANY (A)  RARE   Urine-Other MUCOUS PRESENT    Laboratory interpretation all normal except contaminated urinalysis, mild stable anemia     Rolland Porter, MD 05/09/15 2336

## 2015-05-09 NOTE — ED Provider Notes (Signed)
CSN: [id number]     Arrival date & time 05/09/15  2150 History  By signing my name below, I, Meriel Pica, attest that this documentation has been prepared under the direction and in the presence of Tanna Furry, MD. Electronically Signed: Meriel Pica, ED Scribe. 05/09/2015. 10:22 PM.  Chief Complaint  Patient presents with  . Abdominal Pain   The history is provided by the patient. No language interpreter was used.   HPI Comments: Emily Morales is a 24 y.o. female who is [redacted] weeks pregnant, presents to the Emergency Department complaining of constant, 8/10, mid sternum tightness that radiates laterally as a pressure to bilateral subcostal regions  She reports the mid sternum pain is typical of her GERD but she denies experiencing radiation of her GERD pain in the past. She is followed by [facility] where a subchorionic hemorrhage was found by [place] at 10 weeks. She has not had any vaginal bleeding or vaginal discharge associated with this diagnosis. Also denies nausea or vomiting today.    Past Medical History  Diagnosis Date  . Ovarian cyst, right   . Abnormal Pap smear   . Hx of chlamydia infection   . Headache(784.0)   . Uterine fibroid    Past Surgical History  Procedure Laterality Date  . No past surgeries    . Colposcopy    . Lipoma surgery     Family History  Problem Relation Age of Onset  . Diabetes Father   . Hypertension Father   . Cancer Maternal Grandmother     lung  . Heart disease Paternal Grandmother   . Other Son     reactive airway disease  . Asthma Son   . Heart murmur Son    Social History  Substance Use Topics  . Smoking status: Never Smoker   . Smokeless tobacco: Never Used  . Alcohol Use: No     Comment: occ   OB History    Gravida Para Term Preterm AB TAB SAB Ectopic Multiple Living   2 1 1  0 0 0 0 0 0 1     Review of Systems  Constitutional: Negative for fever, chills, diaphoresis, appetite change and fatigue.  HENT: Negative  for mouth sores, sore throat and trouble swallowing.   Eyes: Negative for visual disturbance.  Respiratory: Negative for cough, chest tightness, shortness of breath and wheezing.   Cardiovascular: Positive for chest pain.  Gastrointestinal: Positive for abdominal pain. Negative for nausea, vomiting, diarrhea and abdominal distention.  Endocrine: Negative for polydipsia, polyphagia and polyuria.  Genitourinary: Negative for dysuria, frequency, hematuria, vaginal bleeding, vaginal discharge and vaginal pain.  Musculoskeletal: Negative for gait problem.  Skin: Negative for color change, pallor and rash.  Neurological: Negative for dizziness, syncope, light-headedness and headaches.  Hematological: Does not bruise/bleed easily.  Psychiatric/Behavioral: Negative for behavioral problems and confusion.   Allergies  Latex; Pollen extract; and Other  Home Medications   Prior to Admission medications   Medication Sig Start Date End Date Taking? Authorizing Provider  acetaminophen (TYLENOL) 500 MG tablet Take 500 mg by mouth every 6 (six) hours as needed for mild pain or moderate pain.   Yes Historical Provider, MD  Prenatal Vit-Fe Fumarate-FA (MULTIVITAMIN-PRENATAL) 27-0.8 MG TABS tablet Take 1 tablet by mouth daily at 12 noon.   Yes Historical Provider, MD  ferrous sulfate 325 (65 FE) MG tablet Take 1 tablet (325 mg total) by mouth 2 (two) times daily with a meal. 04/20/15   Joelene Millin  R Booker, CNM   BP 129/74 mmHg  Pulse 95  Temp(Src) 98.3 F (36.8 C) (Oral)  Resp 24  Ht 5\' 5"  (1.651 m)  Wt 193 lb (87.544 kg)  BMI 32.12 kg/m2  SpO2 100%  LMP 01/29/2015 (Exact Date) Physical Exam  Constitutional: She is oriented to person, place, and time. She appears well-developed and well-nourished. No distress.  HENT:  Head: Normocephalic.  Eyes: Conjunctivae are normal. Pupils are equal, round, and reactive to light. No scleral icterus.  Neck: Normal range of motion. Neck supple. No thyromegaly  present.  Cardiovascular: Normal rate and regular rhythm.  Exam reveals no gallop and no friction rub.   No murmur heard. Pulmonary/Chest: Effort normal and breath sounds normal. No respiratory distress. She has no wheezes. She has no rales.  Abdominal: Soft. Bowel sounds are normal. She exhibits no distension. There is tenderness. There is no rebound.  Mild tenderness diffusely through abdomen, no specific RLQ tenderness. Bedside US viable fetus with fetal heart tone at 130bpm.   Musculoskeletal: Normal range of motion.  Neurological: She is alert and oriented to person, place, and time.  Skin: Skin is warm and dry. No rash noted.  Psychiatric: She has a normal mood and affect. Her behavior is normal.    ED Course  Procedures  DIAGNOSTIC STUDIES: Oxygen Saturation is 100% on RA, normal by my interpretation.    COORDINATION OF CARE: 10:21 PM Discussed treatment plan with pt at bedside and pt agreed to plan. Performed OB US at bedside.    Labs Review Labs Reviewed  URINALYSIS, ROUTINE W REFLEX MICROSCOPIC (NOT AT [facility]) - Abnormal; Notable for the following:    Hgb urine dipstick SMALL (*)    All other components within normal limits  CBC WITH DIFFERENTIAL/PLATELET - Abnormal; Notable for the following:    RBC 3.41 (*)    Hemoglobin 9.7 (*)    HCT 28.0 (*)    All other components within normal limits  COMPREHENSIVE METABOLIC PANEL - Abnormal; Notable for the following:    Sodium 133 (*)    CO2 20 (*)    Creatinine, Ser 0.41 (*)    Calcium 8.7 (*)    Albumin 3.3 (*)    ALT 12 (*)    Total Bilirubin 0.2 (*)    All other components within normal limits  LIPASE, BLOOD - Abnormal; Notable for the following:    Lipase 19 (*)    All other components within normal limits  URINE MICROSCOPIC-ADD ON - Abnormal; Notable for the following:    Squamous Epithelial / LPF MANY (*)    Bacteria, UA MANY (*)    All other components within normal limits    Imaging Review No results  found. I have personally reviewed and evaluated these images and lab results as part of my medical decision-making.   EKG Interpretation None      MDM   Final diagnoses:  Abdominal pain, unspecified abdominal location  Pregnancy with generalized abdominal pain, antepartum    Patient has been in touch with her GYN office this week with bilateral upper and lower abdominal pain. Her white blood count is normal. She does not have an abdomen that is concerning for peritoneal irritation to suggest appendicitis or cholecystitis. Movement and fetal heart tones noted on ultrasound. Given pain medication and fluids and her symptoms have improved. Await comprehensive, and urine.  I personally performed the services described in this documentation, which was scribed in my presence. The recorded information has been  reviewed and is accurate.    Tanna Furry, MD 05/12/15 2330

## 2015-05-17 ENCOUNTER — Encounter: Payer: Self-pay | Admitting: Obstetrics & Gynecology

## 2015-05-17 ENCOUNTER — Ambulatory Visit (INDEPENDENT_AMBULATORY_CARE_PROVIDER_SITE_OTHER): Payer: 59 | Admitting: Obstetrics & Gynecology

## 2015-05-17 VITALS — BP 80/60 | HR 88 | Wt 194.0 lb

## 2015-05-17 DIAGNOSIS — R896 Abnormal cytological findings in specimens from other organs, systems and tissues: Secondary | ICD-10-CM

## 2015-05-17 DIAGNOSIS — Z3492 Encounter for supervision of normal pregnancy, unspecified, second trimester: Secondary | ICD-10-CM

## 2015-05-17 DIAGNOSIS — Z3682 Encounter for antenatal screening for nuchal translucency: Secondary | ICD-10-CM

## 2015-05-17 DIAGNOSIS — IMO0002 Reserved for concepts with insufficient information to code with codable children: Secondary | ICD-10-CM

## 2015-05-17 DIAGNOSIS — Z331 Pregnant state, incidental: Secondary | ICD-10-CM

## 2015-05-17 DIAGNOSIS — Z1389 Encounter for screening for other disorder: Secondary | ICD-10-CM

## 2015-05-17 LAB — POCT URINALYSIS DIPSTICK
Blood, UA: NEGATIVE
Glucose, UA: NEGATIVE
KETONES UA: NEGATIVE
LEUKOCYTES UA: NEGATIVE
Nitrite, UA: NEGATIVE
PROTEIN UA: NEGATIVE

## 2015-05-17 NOTE — Addendum Note (Signed)
Addended by: Diona Fanti A on: 05/17/2015 10:22 AM   Modules accepted: Orders

## 2015-05-17 NOTE — Progress Notes (Signed)
Colposcopy Procedure Note:  Colposcopy Procedure Note  Indications: Pap smear 2 months ago showed: low-grade squamous intraepithelial neoplasia (LGSIL - encompassing HPV,mild dysplasia,CIN I). The prior pap showed ASCUS with POSITIVE high risk HPV.  Prior cervical/vaginal disease: normal exam without visible pathology. Prior cervical treatment: none.  Smoker:  No. New sexual partner:  No.  : time frame:    History of abnormal Pap: yes  Procedure Details  The risks and benefits of the procedure and Written informed consent obtained.  Speculum placed in vagina and excellent visualization of cervix achieved, cervix swabbed x 3 with acetic acid solution.  Findings: Cervix: no visible lesions, no mosaicism, no punctation and no abnormal vasculature; no biopsies taken. Vaginal inspection: normal without visible lesions. Vulvar colposcopy: vulvar colposcopy not performed.  Specimens: none  Complications: none.  Plan: Follow up colposcopy 6-8 weeks after delivery  G2P1001 [redacted]w[redacted]d Estimated Date of Delivery: 10/29/15  Blood pressure 80/60, pulse 88, weight 194 lb (87.998 kg), last menstrual period 01/29/2015.   BP weight and urine results all reviewed and noted.  Please refer to the obstetrical flow sheet for the fundal height and fetal heart rate documentation:  Patient reports good fetal movement, denies any bleeding and no rupture of membranes symptoms or regular contractions. Patient is without complaints. All questions were answered.  Orders Placed This Encounter  Procedures  . POCT urinalysis dipstick    Plan:  Continued routine obstetrical care,   No Follow-up on file.

## 2015-05-19 LAB — MATERNAL SCREEN, INTEGRATED #2
ADSF: 0.97
AFP MoM: 1.29
Alpha-Fetoprotein: 37.7 ng/mL
CROWN RUMP LENGTH: 62.4 mm
DIA MOM: 0.73
DIA VALUE: 108.1 pg/mL
ESTRIOL UNCONJUGATED: 0.84 ng/mL
Gest. Age on Collection Date: 12.6 weeks
Gestational Age: 16.4 weeks
Maternal Age at EDD: 26.8 years
NUCHAL TRANSLUCENCY (NT): 1.3 mm
NUCHAL TRANSLUCENCY MOM: 0.82
NUMBER OF FETUSES: 1
PAPP-A MoM: 1.73
PAPP-A VALUE: 1269.6 ng/mL
TEST RESULTS: NEGATIVE
WEIGHT: 194 [lb_av]
WEIGHT: 194 [lb_av]
hCG MoM: 3.44
hCG Value: 93.7 IU/mL

## 2015-05-31 ENCOUNTER — Telehealth: Payer: Self-pay | Admitting: Women's Health

## 2015-05-31 DIAGNOSIS — Z3492 Encounter for supervision of normal pregnancy, unspecified, second trimester: Secondary | ICD-10-CM

## 2015-05-31 NOTE — Telephone Encounter (Signed)
Pt states that she has had about 15 nose bleeds so far in her pregnancy. Pt states that the nose bleeds last for about 5 minutes. Pt states that she is very concerned

## 2015-05-31 NOTE — Telephone Encounter (Signed)
Pt aware of Kim's advice and verbalized understanding.

## 2015-06-08 ENCOUNTER — Ambulatory Visit (INDEPENDENT_AMBULATORY_CARE_PROVIDER_SITE_OTHER): Payer: 59 | Admitting: Women's Health

## 2015-06-08 ENCOUNTER — Encounter: Payer: Self-pay | Admitting: Women's Health

## 2015-06-08 VITALS — BP 122/60 | HR 88 | Wt 199.0 lb

## 2015-06-08 DIAGNOSIS — Z331 Pregnant state, incidental: Secondary | ICD-10-CM | POA: Diagnosis not present

## 2015-06-08 DIAGNOSIS — O26892 Other specified pregnancy related conditions, second trimester: Secondary | ICD-10-CM

## 2015-06-08 DIAGNOSIS — Z3492 Encounter for supervision of normal pregnancy, unspecified, second trimester: Secondary | ICD-10-CM

## 2015-06-08 DIAGNOSIS — N949 Unspecified condition associated with female genital organs and menstrual cycle: Secondary | ICD-10-CM

## 2015-06-08 DIAGNOSIS — N898 Other specified noninflammatory disorders of vagina: Secondary | ICD-10-CM | POA: Diagnosis not present

## 2015-06-08 DIAGNOSIS — R87612 Low grade squamous intraepithelial lesion on cytologic smear of cervix (LGSIL): Secondary | ICD-10-CM

## 2015-06-08 DIAGNOSIS — Z1389 Encounter for screening for other disorder: Secondary | ICD-10-CM

## 2015-06-08 LAB — POCT WET PREP (WET MOUNT): Clue Cells Wet Prep Whiff POC: NEGATIVE

## 2015-06-08 LAB — POCT URINALYSIS DIPSTICK
GLUCOSE UA: NEGATIVE
KETONES UA: NEGATIVE
Leukocytes, UA: NEGATIVE
Nitrite, UA: NEGATIVE
PROTEIN UA: NEGATIVE
RBC UA: NEGATIVE

## 2015-06-08 MED ORDER — FERROUS SULFATE 325 (65 FE) MG PO TABS: 325.0000 mg | ORAL_TABLET | Freq: Two times a day (BID) | ORAL | Status: DC

## 2015-06-08 NOTE — Progress Notes (Signed)
Work-in Low-risk OB appointment G2P1001 [redacted]w[redacted]d Estimated Date of Delivery: 10/29/15 BP 122/60 mmHg  Pulse 88  Wt 199 lb (90.266 kg)  LMP 01/29/2015 (Exact Date)  BP, weight, and urine reviewed.  Refer to obstetrical flow sheet for FH & FHR.  Reports good fm.  Denies regular uc's, lof, vb, or uti s/s. Lots of pelvic pressure/pain, no abnormal d/c,itching/odor/irritation Had called last week about nose bleeds, but states they have stopped Spec exam: cx visually closed, mod amount creamy white nonodorous d/c, wet prep neg SVE: LTC Reviewed ptl s/s, fm. Recommended  Plan:  Continue routine obstetrical care  F/U as scheduled on Monday for OB appointment and anatomy u/s

## 2015-06-08 NOTE — Patient Instructions (Addendum)
For your lower back pain you may:  Purchase a pregnancy belt from [employer] [place], [employer], [employer], etc and wear it while you are up and about  Take warm baths  Use a heating pad to your lower back for no longer than 20 minutes at a time, and do not place near abdomen  Take tylenol as needed. Please follow directions on the bottle   Kinesthesiology tape  Second Trimester of Pregnancy The second trimester is from week 13 through week 28, months 4 through 6. The second trimester is often a time when you feel your best. Your body has also adjusted to being pregnant, and you begin to feel better physically. Usually, morning sickness has lessened or quit completely, you may have more energy, and you may have an increase in appetite. The second trimester is also a time when the fetus is growing rapidly. At the end of the sixth month, the fetus is about 9 inches long and weighs about 1 pounds. You will likely begin to feel the baby move (quickening) between 18 and 20 weeks of the pregnancy. BODY CHANGES Your body goes through many changes during pregnancy. The changes vary from woman to woman.   Your weight will continue to increase. You will notice your lower abdomen bulging out.  You may begin to get stretch marks on your hips, abdomen, and breasts.  You may develop headaches that can be relieved by medicines approved by your health care provider.  You may urinate more often because the fetus is pressing on your bladder.  You may develop or continue to have heartburn as a result of your pregnancy.  You may develop constipation because certain hormones are causing the muscles that push waste through your intestines to slow down.  You may develop hemorrhoids or swollen, bulging veins (varicose veins).  You may have back pain because of the weight gain and pregnancy hormones relaxing your joints between the bones in your pelvis and as a result of a shift in weight and the muscles  that support your balance.  Your breasts will continue to grow and be tender.  Your gums may bleed and may be sensitive to brushing and flossing.  Dark spots or blotches (chloasma, mask of pregnancy) may develop on your face. This will likely fade after the baby is born.  A dark line from your belly button to the pubic area (linea nigra) may appear. This will likely fade after the baby is born.  You may have changes in your hair. These can include thickening of your hair, rapid growth, and changes in texture. Some women also have hair loss during or after pregnancy, or hair that feels dry or thin. Your hair will most likely return to normal after your baby is born. WHAT TO EXPECT AT YOUR PRENATAL VISITS During a routine prenatal visit:  You will be weighed to make sure you and the fetus are growing normally.  Your blood pressure will be taken.  Your abdomen will be measured to track your baby's growth.  The fetal heartbeat will be listened to.  Any test results from the previous visit will be discussed. Your health care provider may ask you:  How you are feeling.  If you are feeling the baby move.  If you have had any abnormal symptoms, such as leaking fluid, bleeding, severe headaches, or abdominal cramping.  If you are using any tobacco products, including cigarettes, chewing tobacco, and electronic cigarettes.  If you have any questions. Other  tests that may be performed during your second trimester include:  Blood tests that check for:  Low iron levels (anemia).  Gestational diabetes (between 24 and 28 weeks).  Rh antibodies.  Urine tests to check for infections, diabetes, or protein in the urine.  An ultrasound to confirm the proper growth and development of the baby.  An amniocentesis to check for possible genetic problems.  Fetal screens for spina bifida and Down syndrome.  HIV (human immunodeficiency virus) testing. Routine prenatal testing includes  screening for HIV, unless you choose not to have this test. HOME CARE INSTRUCTIONS   Avoid all smoking, herbs, alcohol, and unprescribed drugs. These chemicals affect the formation and growth of the baby.  Do not use any tobacco products, including cigarettes, chewing tobacco, and electronic cigarettes. If you need help quitting, ask your health care provider. You may receive counseling support and other resources to help you quit.  Follow your health care provider's instructions regarding medicine use. There are medicines that are either safe or unsafe to take during pregnancy.  Exercise only as directed by your health care provider. Experiencing uterine cramps is a good sign to stop exercising.  Continue to eat regular, healthy meals.  Wear a good support bra for breast tenderness.  Do not use hot tubs, steam rooms, or saunas.  Wear your seat belt at all times when driving.  Avoid raw meat, uncooked cheese, cat litter boxes, and soil used by cats. These carry germs that can cause birth defects in the baby.  Take your prenatal vitamins.  Take 1500-2000 mg of calcium daily starting at the 20th week of pregnancy until you deliver your baby.  Try taking a stool softener (if your health care provider approves) if you develop constipation. Eat more high-fiber foods, such as fresh vegetables or fruit and whole grains. Drink plenty of fluids to keep your urine clear or pale yellow.  Take warm sitz baths to soothe any pain or discomfort caused by hemorrhoids. Use hemorrhoid cream if your health care provider approves.  If you develop varicose veins, wear support hose. Elevate your feet for 15 minutes, 3-4 times a day. Limit salt in your diet.  Avoid heavy lifting, wear low heel shoes, and practice good posture.  Rest with your legs elevated if you have leg cramps or low back pain.  Visit your dentist if you have not gone yet during your pregnancy. Use a soft toothbrush to brush your teeth  and be gentle when you floss.  A sexual relationship may be continued unless your health care provider directs you otherwise.  Continue to go to all your prenatal visits as directed by your health care provider. SEEK MEDICAL CARE IF:   You have dizziness.  You have mild pelvic cramps, pelvic pressure, or nagging pain in the abdominal area.  You have persistent nausea, vomiting, or diarrhea.  You have a bad smelling vaginal discharge.  You have pain with urination. SEEK IMMEDIATE MEDICAL CARE IF:   You have a fever.  You are leaking fluid from your vagina.  You have spotting or bleeding from your vagina.  You have severe abdominal cramping or pain.  You have rapid weight gain or loss.  You have shortness of breath with chest pain.  You notice sudden or extreme swelling of your face, hands, ankles, feet, or legs.  You have not felt your baby move in over an hour.  You have severe headaches that do not go away with medicine.  You have  vision changes.   This information is not intended to replace advice given to you by your health care provider. Make sure you discuss any questions you have with your health care provider.   Document Released: 07/11/2001 Document Revised: 08/07/2014 Document Reviewed: 09/17/2012 Elsevier Interactive Patient Education [employer].

## 2015-06-11 ENCOUNTER — Other Ambulatory Visit: Payer: Self-pay | Admitting: Obstetrics & Gynecology

## 2015-06-11 DIAGNOSIS — Z1389 Encounter for screening for other disorder: Secondary | ICD-10-CM

## 2015-06-14 ENCOUNTER — Encounter: Payer: Self-pay | Admitting: Obstetrics and Gynecology

## 2015-06-14 ENCOUNTER — Ambulatory Visit (INDEPENDENT_AMBULATORY_CARE_PROVIDER_SITE_OTHER): Payer: 59

## 2015-06-14 ENCOUNTER — Encounter: Payer: 59 | Admitting: Women's Health

## 2015-06-14 ENCOUNTER — Ambulatory Visit (INDEPENDENT_AMBULATORY_CARE_PROVIDER_SITE_OTHER): Payer: 59 | Admitting: Obstetrics and Gynecology

## 2015-06-14 VITALS — BP 110/70 | HR 89 | Wt 200.0 lb

## 2015-06-14 DIAGNOSIS — Z36 Encounter for antenatal screening of mother: Secondary | ICD-10-CM

## 2015-06-14 DIAGNOSIS — Z3492 Encounter for supervision of normal pregnancy, unspecified, second trimester: Secondary | ICD-10-CM

## 2015-06-14 DIAGNOSIS — Z1389 Encounter for screening for other disorder: Secondary | ICD-10-CM

## 2015-06-14 NOTE — Progress Notes (Signed)
Patient ID: Emily Morales, female   DOB: 1991-02-11, 24 y.o.   MRN: [id number]  G2P1001 [redacted]w[redacted]d Estimated Date of Delivery: 10/29/15  Blood pressure 110/70, pulse 89, weight 200 lb (90.719 kg), last menstrual period 01/29/2015.   refer to the ob flow sheet for FH and FHR, also BP, Wt, Urine results:notable for negative  Patient reports  + good fetal movement, denies any bleeding and no rupture of membranes symptoms or regular contractions. Patient complaints: Pt reports yellowish discharge that occurred last night. She also states intermittent suprapubic pain. Pt denies fluid loss and vaginal discharge.   Pt reports intermittent HA that last multiple days at a time. She had similar HAs prior to start of pregnancy and states that they are unchanged in severity or frequency.   FH: 20 cm  Questions were answered. Assessment: [redacted]w[redacted]d; G2P1001  Plan:  Continued routine obstetrical care.  F/u in 4 weeks for routine pre-natal care.   By signing my name below, I, Tula Nakayama, attest that this documentation has been prepared under the direction and in the presence of Jonnie Kind, MD Electronically Signed: Tula Nakayama, ED Scribe. 06/14/2015. 9:36 AM.  I personally performed the services described in this documentation, which was SCRIBED in my presence. The recorded information has been reviewed and considered accurate. It has been edited as necessary during review. Jonnie Kind, MD

## 2015-06-14 NOTE — Progress Notes (Signed)
Pt states that she has the discharge again last night. Pt states that the discharge had a yellow tint to it and it was a clump, but not having any this morning. Pt denies any itching or odor.

## 2015-06-14 NOTE — Progress Notes (Addendum)
[place] [id number] wks,cephalic,post pl gr 0,normal ov's bilat,cx 4.8cm,fhr 150 bpm,svp of fluid 4.4 cm,efw 356g,anatomy complete,no obvious abn seen

## 2015-06-15 ENCOUNTER — Inpatient Hospital Stay (HOSPITAL_COMMUNITY)
Admission: AD | Admit: 2015-06-15 | Discharge: 2015-06-16 | Disposition: A | Discharge status: Home / Self Care | Payer: 59 | Source: Ambulatory Visit | Attending: Obstetrics and Gynecology | Admitting: Obstetrics and Gynecology

## 2015-06-15 DIAGNOSIS — O26892 Other specified pregnancy related conditions, second trimester: Secondary | ICD-10-CM | POA: Insufficient documentation

## 2015-06-15 DIAGNOSIS — N898 Other specified noninflammatory disorders of vagina: Secondary | ICD-10-CM | POA: Insufficient documentation

## 2015-06-15 DIAGNOSIS — N949 Unspecified condition associated with female genital organs and menstrual cycle: Secondary | ICD-10-CM

## 2015-06-15 DIAGNOSIS — Z3A20 20 weeks gestation of pregnancy: Secondary | ICD-10-CM | POA: Insufficient documentation

## 2015-06-15 DIAGNOSIS — Z789 Other specified health status: Secondary | ICD-10-CM

## 2015-06-15 NOTE — MAU Note (Signed)
Pt reports she has been having vaginal pain for 3 days, tonight she began having pain in her hips, right lower abd pain and worsening vaginal pain. Denies bleeding.

## 2015-06-16 ENCOUNTER — Encounter (HOSPITAL_COMMUNITY): Payer: Self-pay

## 2015-06-16 ENCOUNTER — Telehealth: Payer: Self-pay | Admitting: Advanced Practice Midwife

## 2015-06-16 DIAGNOSIS — N949 Unspecified condition associated with female genital organs and menstrual cycle: Secondary | ICD-10-CM

## 2015-06-16 DIAGNOSIS — O26892 Other specified pregnancy related conditions, second trimester: Secondary | ICD-10-CM | POA: Diagnosis not present

## 2015-06-16 DIAGNOSIS — Z3A20 20 weeks gestation of pregnancy: Secondary | ICD-10-CM | POA: Diagnosis not present

## 2015-06-16 DIAGNOSIS — N898 Other specified noninflammatory disorders of vagina: Secondary | ICD-10-CM | POA: Diagnosis not present

## 2015-06-16 DIAGNOSIS — M549 Dorsalgia, unspecified: Secondary | ICD-10-CM | POA: Diagnosis present

## 2015-06-16 LAB — URINE MICROSCOPIC-ADD ON

## 2015-06-16 LAB — URINALYSIS, ROUTINE W REFLEX MICROSCOPIC
Bilirubin Urine: NEGATIVE
Glucose, UA: NEGATIVE mg/dL
Ketones, ur: NEGATIVE mg/dL
LEUKOCYTES UA: NEGATIVE
NITRITE: NEGATIVE
Protein, ur: NEGATIVE mg/dL
SPECIFIC GRAVITY, URINE: 1.02 (ref 1.005–1.030)
pH: 5.5 (ref 5.0–8.0)

## 2015-06-16 NOTE — Discharge Instructions (Signed)
Back Pain in Pregnancy °Back pain during pregnancy is common. It happens in about half of all pregnancies. It is important for you and your baby that you remain active during your pregnancy. If you feel that back pain is not allowing you to remain active or sleep well, it is time to see your caregiver. Back pain may be caused by several factors related to changes during your pregnancy. Fortunately, unless you had trouble with your back before your pregnancy, the pain is likely to get better after you deliver. °Low back pain usually occurs between the fifth and seventh months of pregnancy. It can, however, happen in the first couple months. Factors that increase the risk of back problems include:  °· Previous back problems. °· Injury to your back. °· Having twins or multiple births. °· A chronic cough. °· Stress. °· Job-related repetitive motions. °· Muscle or spinal disease in the back. °· Family history of back problems, ruptured (herniated) discs, or osteoporosis. °· Depression, anxiety, and panic attacks. °CAUSES  °· When you are pregnant, your body produces a hormone called relaxin. This hormone makes the ligaments connecting the low back and pubic bones more flexible. This flexibility allows the baby to be delivered more easily. When your ligaments are loose, your muscles need to work harder to support your back. Soreness in your back can come from tired muscles. Soreness can also come from back tissues that are irritated since they are receiving less support. °· As the baby grows, it puts pressure on the nerves and blood vessels in your pelvis. This can cause back pain. °· As the baby grows and gets heavier during pregnancy, the uterus pushes the stomach muscles forward and changes your center of gravity. This makes your back muscles work harder to maintain good posture. °SYMPTOMS  °Lumbar pain during pregnancy °Lumbar pain during pregnancy usually occurs at or above the waist in the center of the back. There  may be pain and numbness that radiates into your leg or foot. This is similar to low back pain experienced by non-pregnant women. It usually increases with sitting for long periods of time, standing, or repetitive lifting. Tenderness may also be present in the muscles along your upper back. °Posterior pelvic pain during pregnancy °Pain in the back of the pelvis is more common than lumbar pain in pregnancy. It is a deep pain felt in your side at the waistline, or across the tailbone (sacrum), or in both places. You may have pain on one or both sides. This pain can also go into the buttocks and backs of the upper thighs. Pubic and groin pain may also be present. The pain does not quickly resolve with rest, and morning stiffness may also be present. °Pelvic pain during pregnancy can be brought on by most activities. A high level of fitness before and during pregnancy may or may not prevent this problem. Labor pain is usually 1 to 2 minutes apart, lasts for about 1 minute, and involves a bearing down feeling or pressure in your pelvis. However, if you are at term with the pregnancy, constant low back pain can be the beginning of early labor, and you should be aware of this. °DIAGNOSIS  °X-rays of the back should not be done during the first 12 to 14 weeks of the pregnancy and only when absolutely necessary during the rest of the pregnancy. MRIs do not give off radiation and are safe during pregnancy. MRIs also should only be done when absolutely necessary. °HOME CARE INSTRUCTIONS °· Exercise   as directed by your caregiver. Exercise is the most effective way to prevent or manage back pain. If you have a back problem, it is especially important to avoid sports that require sudden body movements. Swimming and walking are great activities.  Do not stand in one place for long periods of time.  Do not wear high heels.  Sit in chairs with good posture. Use a pillow on your lower back if necessary. Make sure your  head rests over your shoulders and is not hanging forward.  Try sleeping on your side, preferably the left side, with a pillow or two between your legs. If you are sore after a night's rest, your bedmay betoo soft.Try placing a board between your mattress and box spring.  Listen to your body when lifting.If you are experiencing pain, ask for help or try bending yourknees more so you can use your leg muscles rather than your back muscles. Squat down when picking up something from the floor. Do not bend over.  Eat a healthy diet. Try to gain weight within your caregiver's recommendations.  Use heat or cold packs 3 to 4 times a day for 15 minutes to help with the pain.  Only take over-the-counter or prescription medicines for pain, discomfort, or fever as directed by your caregiver. Sudden (acute) back pain  Use bed rest for only the most extreme, acute episodes of back pain. Prolonged bed rest over 48 hours will aggravate your condition.  Ice is very effective for acute conditions.  Put ice in a plastic bag.  Place a towel between your skin and the bag.  Leave the ice on for 10 to 20 minutes every 2 hours, or as needed.  Using heat packs for 30 minutes prior to activities is also helpful. Continued back pain See your caregiver if you have continued problems. Your caregiver can help or refer you for appropriate physical therapy. With conditioning, most back problems can be avoided. Sometimes, a more serious issue may be the cause of back pain. You should be seen right away if new problems seem to be developing. Your caregiver may recommend:  A maternity girdle.  An elastic sling.  A back brace.  A massage therapist or acupuncture. SEEK MEDICAL CARE IF:   You are not able to do most of your daily activities, even when taking the pain medicine you were given.  You need a referral to a physical therapist or chiropractor.  You want to try acupuncture. SEEK IMMEDIATE MEDICAL  CARE IF:  You develop numbness, tingling, weakness, or problems with the use of your arms or legs.  You develop severe back pain that is no longer relieved with medicines.  You have a sudden change in bowel or bladder control.  You have increasing pain in other areas of the body.  You develop shortness of breath, dizziness, or fainting.  You develop nausea, vomiting, or sweating.  You have back pain which is similar to labor pains.  You have back pain along with your water breaking or vaginal bleeding.  You have back pain or numbness that travels down your leg.  Your back pain developed after you fell.  You develop pain on one side of your back. You may have a kidney stone.  You see blood in your urine. You may have a bladder infection or kidney stone.  You have back pain with blisters. You may have shingles. Back pain is fairly common during pregnancy but should not be accepted as just part of  the process. Back pain should always be treated as soon as possible. This will make your pregnancy as pleasant as possible. °  °This information is not intended to replace advice given to you by your health care provider. Make sure you discuss any questions you have with your health care provider. °  °Document Released: 10/25/2005 Document Revised: 10/09/2011 Document Reviewed: 12/06/2010 °Elsevier Interactive Patient Education ©2016 [employer] °Round Ligament Pain °The round ligament is a cord of muscle and tissue that helps to support the uterus. It can become a source of pain during pregnancy if it becomes stretched or twisted as the baby grows. The pain usually begins in the second trimester of pregnancy, and it can come and go until the baby is delivered. It is not a serious problem, and it does not cause harm to the baby. °Round ligament pain is usually a short, sharp, and pinching pain, but it can also be a dull, lingering, and aching pain. The pain is felt in the lower side of the abdomen or  in the groin. It usually starts deep in the groin and moves up to the outside of the hip area. Pain can occur with: °· A sudden change in position. °· Rolling over in bed. °· Coughing or sneezing. °· Physical activity. °HOME CARE INSTRUCTIONS °Watch your condition for any changes. Take these steps to help with your pain: °· When the pain starts, relax. Then try: °¨ Sitting down. °¨ Flexing your knees up to your abdomen. °¨ Lying on your side with one pillow under your abdomen and another pillow between your legs. °¨ Sitting in a warm bath for 15-20 minutes or until the pain goes away. °· Take over-the-counter and prescription medicines only as told by your health care provider. °· Move slowly when you sit and stand. °· Avoid long walks if they cause pain. °· Stop or lessen your physical activities if they cause pain. °SEEK MEDICAL CARE IF: °· Your pain does not go away with treatment. °· You feel pain in your back that you did not have before. °· Your medicine is not helping. °SEEK IMMEDIATE MEDICAL CARE IF: °· You develop a fever or chills. °· You develop uterine contractions. °· You develop vaginal bleeding. °· You develop nausea or vomiting. °· You develop diarrhea. °· You have pain when you urinate. °  °This information is not intended to replace advice given to you by your health care provider. Make sure you discuss any questions you have with your health care provider. °  °Document Released: 04/25/2008 Document Revised: 10/09/2011 Document Reviewed: 09/23/2014 °Elsevier Interactive Patient Education ©2016 [employer] Inc. ° °

## 2015-06-16 NOTE — MAU Provider Note (Signed)
None     Chief Complaint: vaginal and hip/back pain, yellow dc for 3 days Vaginal pain is "sharp and throbbing". Back pain in lower back/hip pain. Also lower abdominal pain.   Obstetrical/Gynecological History: OB History    Gravida Para Term Preterm AB TAB SAB Ectopic Multiple Living   2 1 1  0 0 0 0 0 0 1     Past Medical History: Past Medical History  Diagnosis Date  . Ovarian cyst, right   . Abnormal Pap smear   . Hx of chlamydia infection   . Headache(784.0)   . Uterine fibroid     Past Surgical History: Past Surgical History  Procedure Laterality Date  . No past surgeries    . Colposcopy    . Lipoma surgery      Family History: Family History  Problem Relation Age of Onset  . Diabetes Father   . Hypertension Father   . Cancer Maternal Grandmother     lung  . Heart disease Paternal Grandmother   . Other Son     reactive airway disease  . Asthma Son   . Heart murmur Son     Social History: Social History  Substance Use Topics  . Smoking status: Never Smoker   . Smokeless tobacco: Never Used  . Alcohol Use: No     Comment: occ    Allergies:  Allergies  Allergen Reactions  . Latex   . Pollen Extract Cough  . Other Rash    Coconut causes a rash    Meds:  Prescriptions prior to admission  Medication Sig Dispense Refill Last Dose  . acetaminophen (TYLENOL) 325 MG tablet Take 650 mg by mouth every 6 (six) hours as needed.   06/15/2015 at 2100  . ferrous sulfate 325 (65 FE) MG tablet Take 1 tablet (325 mg total) by mouth 2 (two) times daily with a meal. 60 tablet 3 06/15/2015 at Unknown time  . Prenatal Vit-Fe Fumarate-FA (MULTIVITAMIN-PRENATAL) 27-0.8 MG TABS tablet Take 1 tablet by mouth daily at 12 noon.   06/15/2015 at Unknown time    Review of Systems   Constitutional: Negative for fever and chills Eyes: Negative for visual disturbances Respiratory: Negative for shortness of breath, dyspnea Cardiovascular: Negative for chest pain or  palpitations  Gastrointestinal: Negative for vomiting, diarrhea and constipation Genitourinary: Negative for dysuria and urgency Musculoskeletal: Normal ROM  Neurological: Negative for dizziness and headaches    Physical Exam  Blood pressure 115/70, pulse 106, temperature 98.2 F (36.8 C), temperature source Oral, resp. rate 18, height 5\' 5"  (1.651 m), weight 92.987 kg (205 lb), last menstrual period 01/29/2015, SpO2 98 %. GENERAL: Well-developed, well-nourished female in no acute distress.  LUNGS: Clear to auscultation bilaterally.  HEART: Regular rate and rhythm. ABDOMEN: Soft, nontender, nondistended, gravid.  PELVIC:  SSE:  Normal appearing dc without odor. Wet prep negative.  CX LTC, firm EXTREMITIES: Nontender, no edema, 2+ distal pulses. DTR's 2+ CERVICAL EXAM: Dilatation 0cm   Effacement 0%   Station out of pelvis FHT:  + 150 doppler  Labs: Results for orders placed or performed during the hospital encounter of 06/15/15 (from the past 24 hour(s))  Urinalysis, Routine w reflex microscopic (not at [facility])   Collection Time: 06/16/15 12:06 AM  Result Value Ref Range   Color, Urine YELLOW YELLOW   APPearance CLEAR CLEAR   Specific Gravity, Urine 1.020 1.005 - 1.030   pH 5.5 5.0 - 8.0   Glucose, UA NEGATIVE NEGATIVE mg/dL   Hgb  urine dipstick SMALL (A) NEGATIVE   Bilirubin Urine NEGATIVE NEGATIVE   Ketones, ur NEGATIVE NEGATIVE mg/dL   Protein, ur NEGATIVE NEGATIVE mg/dL   Nitrite NEGATIVE NEGATIVE   Leukocytes, UA NEGATIVE NEGATIVE  Urine microscopic-add on   Collection Time: 06/16/15 12:06 AM  Result Value Ref Range   Squamous Epithelial / LPF 0-5 (A) NONE SEEN   WBC, UA 0-5 0 - 5 WBC/hpf   RBC / HPF 0-5 0 - 5 RBC/hpf   Bacteria, UA FEW (A) NONE SEEN     Assessment: Emily Morales is  24 y.o. G2P1001 at [redacted]w[redacted]d presents with normal vaginal dc, normal pains of pregnancy.  Plan: DC home  CRESENZO-DISHMAN,Tevon Berhane 11/16/20161:37 AM

## 2015-06-16 NOTE — Telephone Encounter (Signed)
Pt requesting a note to excuse from work today was seen at MAU this morning. Note left at front desk to excuse pt from work today.

## 2015-06-21 ENCOUNTER — Telehealth: Payer: Self-pay | Admitting: Women's Health

## 2015-06-21 NOTE — Telephone Encounter (Signed)
Pt c/o sore throat, stuffy nose, body aches, no fever. Pt informed gargle with warm salt water, Robitussin OTC, Lozenges, hot tea, tylenol for body aches. If no improvement call our office back. Pt verbalized understanding.

## 2015-07-12 ENCOUNTER — Encounter: Payer: Self-pay | Admitting: Obstetrics and Gynecology

## 2015-07-12 ENCOUNTER — Ambulatory Visit (INDEPENDENT_AMBULATORY_CARE_PROVIDER_SITE_OTHER): Payer: 59 | Admitting: Obstetrics and Gynecology

## 2015-07-12 VITALS — BP 116/76 | HR 92 | Wt 208.0 lb

## 2015-07-12 DIAGNOSIS — Z1389 Encounter for screening for other disorder: Secondary | ICD-10-CM

## 2015-07-12 DIAGNOSIS — Z331 Pregnant state, incidental: Secondary | ICD-10-CM

## 2015-07-12 DIAGNOSIS — Z3492 Encounter for supervision of normal pregnancy, unspecified, second trimester: Secondary | ICD-10-CM

## 2015-07-12 LAB — POCT URINALYSIS DIPSTICK
GLUCOSE UA: NEGATIVE
Ketones, UA: NEGATIVE
LEUKOCYTES UA: NEGATIVE
NITRITE UA: NEGATIVE
Protein, UA: NEGATIVE

## 2015-07-12 NOTE — Progress Notes (Signed)
Patient ID: Emily Morales, female   DOB: 09/13/1990, 24 y.o.   MRN: [id number] G2P1001 [redacted]w[redacted]d Estimated Date of Delivery: 10/29/15  Blood pressure 116/76, pulse 92, weight 208 lb (94.348 kg), last menstrual period 01/29/2015.   refer to the ob flow sheet for FH and FHR, also BP, Wt, Urine results: negative   Patient reports  + good fetal movement, denies any bleeding and no rupture of membranes symptoms or regular contractions. FHR: 146 FH: 26cm Speculum-white discharge, KOH negative for yeast, wet prep negative for trich and white cells.  Patient complaints: Pt complains of intermittent sharp hip pain radiating to the pelvis. Pt also complains of intermittent vaginal swelling noting that it occurs after she is sitting for an extended period of time. Pt denies any vaginal discharge or vaginal bleeding.   Questions were answered. Assessment:  1. LROB G2P1001 @ [redacted]w[redacted]d  2. Normal vaginal secretions.  3. Physiologic vulvar swelling.   Plan:   1. Continued routine obstetrical care 3. F/u in 4 weeks for routine OB care.    By signing my name below, I, Terressa Koyanagi, attest that this documentation has been prepared under the direction and in the presence of Mallory Shirk, MD. Electronically Signed: Terressa Koyanagi, ED Scribe. 07/12/2015. 9:48 AM.   I personally performed the services described in this documentation, which was SCRIBED in my presence. The recorded information has been reviewed and considered accurate. It has been edited as necessary during review. Jonnie Kind, MD

## 2015-07-12 NOTE — Progress Notes (Signed)
Pt states that she has some vaginal swelling that comes and goes. Pt states that she has not had any itching or discharge. Pt states that the swelling is worse when she is at work and she has to sit a lot.

## 2015-07-13 ENCOUNTER — Ambulatory Visit (INDEPENDENT_AMBULATORY_CARE_PROVIDER_SITE_OTHER): Payer: 59 | Admitting: Orthopedic Surgery

## 2015-07-13 ENCOUNTER — Ambulatory Visit (INDEPENDENT_AMBULATORY_CARE_PROVIDER_SITE_OTHER): Payer: 59

## 2015-07-13 VITALS — BP 101/64 | Ht 65.0 in | Wt 207.0 lb

## 2015-07-13 DIAGNOSIS — M25561 Pain in right knee: Secondary | ICD-10-CM | POA: Diagnosis not present

## 2015-07-13 DIAGNOSIS — M25562 Pain in left knee: Secondary | ICD-10-CM

## 2015-07-13 NOTE — Patient Instructions (Signed)
Call [facility] therapy dept to schedule therapy visits - [phone]  Prescription given for orthotics from [place]

## 2015-07-13 NOTE — Progress Notes (Signed)
  Subjective:    Emily Morales is a 24 y.o. female 24 year old female 6 months pregnant presents with pain swelling locking in the right knee times a year. She describes her pain as aching is worse with activity rates it 8 out of 10 worse in the morning worse with the knees flexed better with the knee straight. Prior treatment includes Tylenol. No trauma.  The following portions of the patient's history were reviewed and updated as appropriate: allergies, past family history, past medical history, past social history, past surgical history and problem list.   Review of Systems Ears, nose, mouth, throat, and face: positive for Vision disturbance Gastrointestinal: positive for constipation Musculoskeletal:positive for back pain    Past Medical History  Diagnosis Date  . Ovarian cyst, right   . Abnormal Pap smear   . Hx of chlamydia infection   . Headache(784.0)   . Uterine fibroid     Past Surgical History  Procedure Laterality Date  . No past surgeries    . Colposcopy    . Lipoma surgery       Objective:    BP 101/64 mmHg  Ht 5\' 5"  (1.651 m)  Wt 207 lb (93.895 kg)  BMI 34.45 kg/m2  LMP 01/29/2015 (Exact Date)  Oriented 3. Appears pregnant otherwise normal appearance. Mood normal. Gait normal.  Bilateral flexible flat feet. Skin intact in both lower extremities. Pinprick sensation normal both legs. Normal pulses with minimal peripheral edema despite pregnancy. Right knee: no effusion, full active range of motion, no joint line tenderness, ligamentous structures intact. and She does have patellofemoral tenderness quadriceps inhibition patellofemoral crepitance medial facet tenderness McMurray sign negative  Left knee:  no effusion, full active range of motion, no joint line tenderness, ligamentous structures intact.   X-ray right knee: no fracture, dislocation, swelling or degenerative changes noted   Please also see my independent x-ray interpretation under radiology  report Assessment:    Right Moderate patellofemoral syndrome bilaterally    Plan:    Natural history and expected course discussed. Questions answered. Quad strengthening exercises. PT referral. When necessary follow-up

## 2015-07-18 ENCOUNTER — Inpatient Hospital Stay (HOSPITAL_COMMUNITY): Payer: 59

## 2015-07-18 ENCOUNTER — Inpatient Hospital Stay (HOSPITAL_COMMUNITY)
Admission: AD | Admit: 2015-07-18 | Discharge: 2015-07-18 | Disposition: A | Discharge status: Home / Self Care | Payer: 59 | Source: Ambulatory Visit | Attending: Obstetrics & Gynecology | Admitting: Obstetrics & Gynecology

## 2015-07-18 ENCOUNTER — Encounter (HOSPITAL_COMMUNITY): Payer: Self-pay | Admitting: *Deleted

## 2015-07-18 DIAGNOSIS — R319 Hematuria, unspecified: Secondary | ICD-10-CM | POA: Diagnosis present

## 2015-07-18 DIAGNOSIS — O26892 Other specified pregnancy related conditions, second trimester: Secondary | ICD-10-CM | POA: Diagnosis not present

## 2015-07-18 DIAGNOSIS — Z3A25 25 weeks gestation of pregnancy: Secondary | ICD-10-CM | POA: Diagnosis not present

## 2015-07-18 DIAGNOSIS — IMO0002 Reserved for concepts with insufficient information to code with codable children: Secondary | ICD-10-CM

## 2015-07-18 LAB — URINALYSIS, ROUTINE W REFLEX MICROSCOPIC
Bilirubin Urine: NEGATIVE
GLUCOSE, UA: NEGATIVE mg/dL
KETONES UR: NEGATIVE mg/dL
LEUKOCYTES UA: NEGATIVE
Nitrite: NEGATIVE
PROTEIN: NEGATIVE mg/dL
Specific Gravity, Urine: 1.025 (ref 1.005–1.030)
pH: 6 (ref 5.0–8.0)

## 2015-07-18 LAB — URINE MICROSCOPIC-ADD ON

## 2015-07-18 NOTE — MAU Provider Note (Signed)
  History   G2P1001 @ 25.2 wks in with c/o blood in urine when voiding. Started today upon arising.Denies pain, vag bleeding or ROM  CSN: [id number]  Arrival date and time: 07/18/15 1136   None     Chief Complaint  Patient presents with  . Hematuria  . Back Pain  . vaginal pressure    HPI  OB History    Gravida Para Term Preterm AB TAB SAB Ectopic Multiple Living   2 1 1  0 0 0 0 0 0 1      Past Medical History  Diagnosis Date  . Ovarian cyst, right   . Abnormal Pap smear   . Hx of chlamydia infection   . Headache(784.0)   . Uterine fibroid     Past Surgical History  Procedure Laterality Date  . Colposcopy    . Lipoma surgery      Family History  Problem Relation Age of Onset  . Diabetes Father   . Hypertension Father   . Cancer Maternal Grandmother     lung  . Heart disease Paternal Grandmother   . Other Son     reactive airway disease  . Asthma Son   . Heart murmur Son     Social History  Substance Use Topics  . Smoking status: Never Smoker   . Smokeless tobacco: Never Used  . Alcohol Use: No     Comment: occ    Allergies:  Allergies  Allergen Reactions  . Pollen Extract Cough  . Latex Rash  . Other Rash    Coconut causes a rash    Prescriptions prior to admission  Medication Sig Dispense Refill Last Dose  . ferrous sulfate 325 (65 FE) MG tablet Take 1 tablet (325 mg total) by mouth 2 (two) times daily with a meal. 60 tablet 3 Past Week at Unknown time  . Prenatal Vit-Fe Fumarate-FA (MULTIVITAMIN-PRENATAL) 27-0.8 MG TABS tablet Take 1 tablet by mouth daily at 12 noon.   Past Week at Unknown time    Review of Systems  Constitutional: Negative.   HENT: Negative.   Eyes: Negative.   Respiratory: Negative.   Cardiovascular: Negative.   Gastrointestinal: Negative.   Genitourinary: Positive for hematuria.  Musculoskeletal: Negative.   Skin: Negative.   Neurological: Negative.   Endo/Heme/Allergies: Negative.   Psychiatric/Behavioral:  Negative.    Physical Exam    Blood pressure 121/70, pulse 98, temperature 98.2 F (36.8 C), resp. rate 18, last menstrual period 01/29/2015.  Physical Exam  Constitutional: She is oriented to person, place, and time. She appears well-developed and well-nourished.  HENT:  Head: Normocephalic.  Eyes: Pupils are equal, round, and reactive to light.  Neck: Normal range of motion.  Cardiovascular: Normal rate, regular rhythm, normal heart sounds and intact distal pulses.   Respiratory: Effort normal and breath sounds normal.  GI: Soft. Bowel sounds are normal.  Genitourinary: Vagina normal and uterus normal.  Musculoskeletal: Normal range of motion.  Neurological: She is alert and oriented to person, place, and time. She has normal reflexes.  Skin: Skin is warm and dry.  Psychiatric: She has a normal mood and affect. Her behavior is normal. Judgment and thought content normal.    MAU Course  Procedures  MDM sm amt blood in urine otherwise neg exam.  Assessment and Plan  SVE firm/cl/post/high.no active bleeding noted.mild variable decels while pt lying flat on back will get BPP if normal will d/c home.  Koren Shiver DARLENE 07/18/2015, 1:49 PM

## 2015-07-18 NOTE — Discharge Instructions (Signed)

## 2015-07-18 NOTE — MAU Note (Signed)
Pt presents to MAU with complaints of lower back and side pain, vaginal pressure, and blood in her urine. Blood when she wipes

## 2015-07-31 ENCOUNTER — Inpatient Hospital Stay (HOSPITAL_COMMUNITY)
Admission: AD | Admit: 2015-07-31 | Discharge: 2015-08-01 | Disposition: A | Discharge status: Home / Self Care | Payer: 59 | Source: Ambulatory Visit | Attending: Obstetrics & Gynecology | Admitting: Obstetrics & Gynecology

## 2015-07-31 DIAGNOSIS — M545 Low back pain: Secondary | ICD-10-CM | POA: Insufficient documentation

## 2015-07-31 DIAGNOSIS — O26892 Other specified pregnancy related conditions, second trimester: Secondary | ICD-10-CM | POA: Insufficient documentation

## 2015-07-31 DIAGNOSIS — O26893 Other specified pregnancy related conditions, third trimester: Secondary | ICD-10-CM

## 2015-07-31 DIAGNOSIS — Z3A27 27 weeks gestation of pregnancy: Secondary | ICD-10-CM | POA: Insufficient documentation

## 2015-07-31 DIAGNOSIS — R109 Unspecified abdominal pain: Secondary | ICD-10-CM | POA: Diagnosis present

## 2015-07-31 DIAGNOSIS — N898 Other specified noninflammatory disorders of vagina: Secondary | ICD-10-CM

## 2015-07-31 DIAGNOSIS — O99891 Other specified diseases and conditions complicating pregnancy: Secondary | ICD-10-CM

## 2015-07-31 DIAGNOSIS — O9989 Other specified diseases and conditions complicating pregnancy, childbirth and the puerperium: Secondary | ICD-10-CM

## 2015-07-31 DIAGNOSIS — M549 Dorsalgia, unspecified: Secondary | ICD-10-CM | POA: Diagnosis not present

## 2015-07-31 DIAGNOSIS — O26899 Other specified pregnancy related conditions, unspecified trimester: Secondary | ICD-10-CM

## 2015-07-31 NOTE — MAU Note (Signed)
Pain in lower back and abd since Friday. Denies LOF or bleeding

## 2015-08-01 ENCOUNTER — Encounter (HOSPITAL_COMMUNITY): Payer: Self-pay | Admitting: *Deleted

## 2015-08-01 DIAGNOSIS — N898 Other specified noninflammatory disorders of vagina: Secondary | ICD-10-CM | POA: Diagnosis not present

## 2015-08-01 DIAGNOSIS — M545 Low back pain: Secondary | ICD-10-CM | POA: Diagnosis not present

## 2015-08-01 DIAGNOSIS — M549 Dorsalgia, unspecified: Secondary | ICD-10-CM

## 2015-08-01 DIAGNOSIS — Z3A27 27 weeks gestation of pregnancy: Secondary | ICD-10-CM | POA: Diagnosis not present

## 2015-08-01 DIAGNOSIS — R109 Unspecified abdominal pain: Secondary | ICD-10-CM

## 2015-08-01 DIAGNOSIS — O26893 Other specified pregnancy related conditions, third trimester: Secondary | ICD-10-CM

## 2015-08-01 DIAGNOSIS — O26892 Other specified pregnancy related conditions, second trimester: Secondary | ICD-10-CM | POA: Diagnosis not present

## 2015-08-01 LAB — URINALYSIS, ROUTINE W REFLEX MICROSCOPIC
Bilirubin Urine: NEGATIVE
Glucose, UA: NEGATIVE mg/dL
KETONES UR: NEGATIVE mg/dL
LEUKOCYTES UA: NEGATIVE
NITRITE: NEGATIVE
PH: 6 (ref 5.0–8.0)
PROTEIN: NEGATIVE mg/dL
Specific Gravity, Urine: >1.03 — ABNORMAL HIGH (ref 1.005–1.030)

## 2015-08-01 LAB — WET PREP, GENITAL
CLUE CELLS WET PREP: NONE SEEN
Sperm: NONE SEEN
Trich, Wet Prep: NONE SEEN
Yeast Wet Prep HPF POC: NONE SEEN

## 2015-08-01 LAB — FETAL FIBRONECTIN: Fetal Fibronectin: NEGATIVE

## 2015-08-01 LAB — URINE MICROSCOPIC-ADD ON: WBC UA: NONE SEEN WBC/hpf (ref 0–5)

## 2015-08-01 MED ORDER — OXYCODONE-ACETAMINOPHEN 5-325 MG PO TABS: 1.0000 | ORAL_TABLET | Freq: Four times a day (QID) | ORAL | Status: DC | PRN

## 2015-08-01 MED ORDER — CYCLOBENZAPRINE HCL 5 MG PO TABS
5.0000 mg | ORAL_TABLET | Freq: Once | ORAL | Status: AC
  Administered 2015-08-01: 5 mg via ORAL
  Filled 2015-08-01: qty 1

## 2015-08-01 MED ORDER — OXYCODONE-ACETAMINOPHEN 5-325 MG PO TABS
2.0000 | ORAL_TABLET | Freq: Once | ORAL | Status: AC
  Administered 2015-08-01: 2 via ORAL
  Filled 2015-08-01: qty 2

## 2015-08-01 MED ORDER — ZOLPIDEM TARTRATE 5 MG PO TABS: 5.0000 mg | ORAL_TABLET | Freq: Every evening | ORAL | Status: DC | PRN

## 2015-08-01 NOTE — MAU Note (Signed)
Pt c/o back pain since yesterday, constant 8/10. Tightening in her abdomen q 10 min since this afternoon. Also c/o depression x 2 days. +FM, denies LOF and VB

## 2015-08-01 NOTE — Discharge Instructions (Signed)
Abdominal Pain During Pregnancy Abdominal pain is common in pregnancy. Most of the time, it does not cause harm. There are many causes of abdominal pain. Some causes are more serious than others. Some of the causes of abdominal pain in pregnancy are easily diagnosed. Occasionally, the diagnosis takes time to understand. Other times, the cause is not determined. Abdominal pain can be a sign that something is very wrong with the pregnancy, or the pain may have nothing to do with the pregnancy at all. For this reason, always tell your health care provider if you have any abdominal discomfort. HOME CARE INSTRUCTIONS  Monitor your abdominal pain for any changes. The following actions may help to alleviate any discomfort you are experiencing:  Do not have sexual intercourse or put anything in your vagina until your symptoms go away completely.  Get plenty of rest until your pain improves.  Drink clear fluids if you feel nauseous. Avoid solid food as long as you are uncomfortable or nauseous.  Only take over-the-counter or prescription medicine as directed by your health care provider.  Keep all follow-up appointments with your health care provider. SEEK IMMEDIATE MEDICAL CARE IF:  You are bleeding, leaking fluid, or passing tissue from the vagina.  You have increasing pain or cramping.  You have persistent vomiting.  You have painful or bloody urination.  You have a fever.  You notice a decrease in your baby's movements.  You have extreme weakness or feel faint.  You have shortness of breath, with or without abdominal pain.  You develop a severe headache with abdominal pain.  You have abnormal vaginal discharge with abdominal pain.  You have persistent diarrhea.  You have abdominal pain that continues even after rest, or gets worse. MAKE SURE YOU:   Understand these instructions.  Will watch your condition.  Will get help right away if you are not doing well or get worse.     This information is not intended to replace advice given to you by your health care provider. Make sure you discuss any questions you have with your health care provider.   Document Released: 07/17/2005 Document Revised: 05/07/2013 Document Reviewed: 02/13/2013 Elsevier Interactive Patient Education 2016 [employer]  Back Pain in Pregnancy Back pain during pregnancy is common. It happens in about half of all pregnancies. It is important for you and your baby that you remain active during your pregnancy.If you feel that back pain is not allowing you to remain active or sleep well, it is time to see your caregiver. Back pain may be caused by several factors related to changes during your pregnancy.Fortunately, unless you had trouble with your back before your pregnancy, the pain is likely to get better after you deliver. Low back pain usually occurs between the fifth and seventh months of pregnancy. It can, however, happen in the first couple months. Factors that increase the risk of back problems include:   Previous back problems.  Injury to your back.  Having twins or multiple births.  A chronic cough.  Stress.  Job-related repetitive motions.  Muscle or spinal disease in the back.  Family history of back problems, ruptured (herniated) discs, or osteoporosis.  Depression, anxiety, and panic attacks. CAUSES   When you are pregnant, your body produces a hormone called relaxin. This hormonemakes the ligaments connecting the low back and pubic bones more flexible. This flexibility allows the baby to be delivered more easily. When your ligaments are loose, your muscles need to work harder to support  your back. Soreness in your back can come from tired muscles. Soreness can also come from back tissues that are irritated since they are receiving less support.  As the baby grows, it puts pressure on the nerves and blood vessels in your pelvis. This can cause back pain.  As the baby  grows and gets heavier during pregnancy, the uterus pushes the stomach muscles forward and changes your center of gravity. This makes your back muscles work harder to maintain good posture. SYMPTOMS  Lumbar pain during pregnancy Lumbar pain during pregnancy usually occurs at or above the waist in the center of the back. There may be pain and numbness that radiates into your leg or foot. This is similar to low back pain experienced by non-pregnant women. It usually increases with sitting for long periods of time, standing, or repetitive lifting. Tenderness may also be present in the muscles along your upper back. Posterior pelvic pain during pregnancy Pain in the back of the pelvis is more common than lumbar pain in pregnancy. It is a deep pain felt in your side at the waistline, or across the tailbone (sacrum), or in both places. You may have pain on one or both sides. This pain can also go into the buttocks and backs of the upper thighs. Pubic and groin pain may also be present. The pain does not quickly resolve with rest, and morning stiffness may also be present. Pelvic pain during pregnancy can be brought on by most activities. A high level of fitness before and during pregnancy may or may not prevent this problem. Labor pain is usually 1 to 2 minutes apart, lasts for about 1 minute, and involves a bearing down feeling or pressure in your pelvis. However, if you are at term with the pregnancy, constant low back pain can be the beginning of early labor, and you should be aware of this. DIAGNOSIS  X-rays of the back should not be done during the first 12 to 14 weeks of the pregnancy and only when absolutely necessary during the rest of the pregnancy. MRIs do not give off radiation and are safe during pregnancy. MRIs also should only be done when absolutely necessary. HOME CARE INSTRUCTIONS  Exercise as directed by your caregiver. Exercise is the most effective way to prevent or manage back pain. If you  have a back problem, it is especially important to avoid sports that require sudden body movements. Swimming and walking are great activities.  Do not stand in one place for long periods of time.  Do not wear high heels.  Sit in chairs with good posture. Use a pillow on your lower back if necessary. Make sure your head rests over your shoulders and is not hanging forward.  Try sleeping on your side, preferably the left side, with a pillow or two between your legs. If you are sore after a night's rest, your bedmay betoo soft.Try placing a board between your mattress and box spring.  Listen to your body when lifting.If you are experiencing pain, ask for help or try bending yourknees more so you can use your leg muscles rather than your back muscles. Squat down when picking up something from the floor. Do not bend over.  Eat a healthy diet. Try to gain weight within your caregiver's recommendations.  Use heat or cold packs 3 to 4 times a day for 15 minutes to help with the pain.  Only take over-the-counter or prescription medicines for pain, discomfort, or fever as directed by your  caregiver. Sudden (acute) back pain  Use bed rest for only the most extreme, acute episodes of back pain. Prolonged bed rest over 48 hours will aggravate your condition.  Ice is very effective for acute conditions.  Put ice in a plastic bag.  Place a towel between your skin and the bag.  Leave the ice on for 10 to 20 minutes every 2 hours, or as needed.  Using heat packs for 30 minutes prior to activities is also helpful. Continued back pain See your caregiver if you have continued problems. Your caregiver can help or refer you for appropriate physical therapy. With conditioning, most back problems can be avoided. Sometimes, a more serious issue may be the cause of back pain. You should be seen right away if new problems seem to be developing. Your caregiver may recommend:  A maternity girdle.  An  elastic sling.  A back brace.  A massage therapist or acupuncture. SEEK MEDICAL CARE IF:   You are not able to do most of your daily activities, even when taking the pain medicine you were given.  You need a referral to a physical therapist or chiropractor.  You want to try acupuncture. SEEK IMMEDIATE MEDICAL CARE IF:  You develop numbness, tingling, weakness, or problems with the use of your arms or legs.  You develop severe back pain that is no longer relieved with medicines.  You have a sudden change in bowel or bladder control.  You have increasing pain in other areas of the body.  You develop shortness of breath, dizziness, or fainting.  You develop nausea, vomiting, or sweating.  You have back pain which is similar to labor pains.  You have back pain along with your water breaking or vaginal bleeding.  You have back pain or numbness that travels down your leg.  Your back pain developed after you fell.  You develop pain on one side of your back. You may have a kidney stone.  You see blood in your urine. You may have a bladder infection or kidney stone.  You have back pain with blisters. You may have shingles. Back pain is fairly common during pregnancy but should not be accepted as just part of the process. Back pain should always be treated as soon as possible. This will make your pregnancy as pleasant as possible.   This information is not intended to replace advice given to you by your health care provider. Make sure you discuss any questions you have with your health care provider.   Document Released: 10/25/2005 Document Revised: 10/09/2011 Document Reviewed: 12/06/2010 Elsevier Interactive Patient Education [employer].

## 2015-08-01 NOTE — L&D Delivery Note (Signed)
Delivery Note At 11:15 PM a viable and healthy female was delivered via Vaginal, Spontaneous Delivery (Presentation: Left Occiput Anterior).  APGAR: 9, 9; Weight: pending Placenta status:intact.  Cord:  without complications: .    Anesthesia: Epidural  Episiotomy: None Lacerations: None Suture Repair: N/A Est. Blood Loss (mL):    Mom to postpartum.  Baby to Couplet care / Skin to Skin. Emily Morales is a 25 y.o. female G2P1001 with IUP at [redacted]w[redacted]d admitted for SOL.  She progressed without augmentation to complete and pushed less than 30 minutes to deliver.  Cord clamping delayed by several minutes then clamped by M.D. and cut by FOB.  Placenta intact and spontaneous, bleeding minimal.  No laceration and baby stable prior to transfer to postpartum. She plans on breastfeeding.      Michael J Bolivia 10/18/2015, 11:47 PM  I was gloved and attended entire delivery Agree with note Shoulders were tight but there was no dystocia. Utilized [place] and shoulders delivered easily Seabron Spates, CNM

## 2015-08-01 NOTE — MAU Provider Note (Signed)
History     CSN: [id number] Arrival date and time: 07/31/15 2338 First Provider Initiated Contact with Patient 08/01/15 0020     Chief Complaint  Patient presents with  . Abdominal Pain  . Back Pain   HPI Patient is 25 y.o. G2P1001 [redacted]w[redacted]d here with Pt c/o back pain since yesterday, constant 8/10. Pain is located in her lower abdomen and lower back. The pain her abdomen she describes as "like a period cramp that doesn't go away." She reports tightening in her abdomen q 10 min since this afternoon- located in the mid-abdomen and moves down. The back pain is constant in nature, no associated tingling or numbness in extremities. Reports no radiation. She has tried tylenol that has not help. No dysuria, no polyuria.  Reports regular BMs. She denies rectal bleeding. Report vaginal discharge that is unchanged.    Also c/o depression x 2 days- crying frequently. This is not the reason she came and intends to talk to her provider about her sx  +FM, denies LOF and VB  Last intercourse 2 months ago Last Cervical Exam check > 2 weeks ago  Pregnancy complicated by: [facility] [id number] @ 10wks & 12.4wks LSIL on pap-colpo neg  OB History    Gravida Para Term Preterm AB TAB SAB Ectopic Multiple Living   2 1 1  0 0 0 0 0 0 1      Past Medical History  Diagnosis Date  . Ovarian cyst, right   . Abnormal Pap smear   . Hx of chlamydia infection   . Headache(784.0)   . Uterine fibroid     Past Surgical History  Procedure Laterality Date  . Colposcopy    . Lipoma surgery      Family History  Problem Relation Age of Onset  . Diabetes Father   . Hypertension Father   . Cancer Maternal Grandmother     lung  . Heart disease Paternal Grandmother   . Other Son     reactive airway disease  . Asthma Son   . Heart murmur Son     Social History  Substance Use Topics  . Smoking status: Never Smoker   . Smokeless tobacco: Never Used  . Alcohol Use: No     Comment: occ    Allergies:   Allergies  Allergen Reactions  . Pollen Extract Cough  . Latex Rash  . Other Rash    Coconut causes a rash    Prescriptions prior to admission  Medication Sig Dispense Refill Last Dose  . ferrous sulfate 325 (65 FE) MG tablet Take 1 tablet (325 mg total) by mouth 2 (two) times daily with a meal. 60 tablet 3 Past Week at Unknown time  . Prenatal Vit-Fe Fumarate-FA (MULTIVITAMIN-PRENATAL) 27-0.8 MG TABS tablet Take 1 tablet by mouth daily at 12 noon.   Past Week at Unknown time    Review of Systems  Constitutional: Negative for fever and chills.  Eyes: Negative for blurred vision and double vision.  Respiratory: Negative for cough and shortness of breath.   Cardiovascular: Negative for chest pain and orthopnea.  Gastrointestinal: Negative for nausea and vomiting.  Genitourinary: Negative for dysuria, frequency and flank pain.  Musculoskeletal: Negative for myalgias.  Skin: Negative for rash.  Neurological: Negative for dizziness, tingling, weakness and headaches.  Endo/Heme/Allergies: Does not bruise/bleed easily.  Psychiatric/Behavioral: Negative for depression and suicidal ideas. The patient is not nervous/anxious.    Physical Exam   Blood pressure 129/68, pulse 115, temperature 98.1 F (36.7  C), resp. rate 22, height 5\' 5"  (1.651 m), weight 212 lb 12.8 oz (96.525 kg), last menstrual period 01/29/2015.  Physical Exam  Nursing note and vitals reviewed. Constitutional: She is oriented to person, place, and time. She appears well-developed and well-nourished. No distress.  Pregnant female  HENT:  Head: Normocephalic and atraumatic.  Eyes: Conjunctivae are normal. No scleral icterus.  Neck: Normal range of motion. Neck supple.  Cardiovascular: Normal rate and intact distal pulses.   Respiratory: Effort normal. She exhibits no tenderness.  GI: Soft. There is no tenderness. There is no rebound and no guarding.  Gravid  Genitourinary: Vagina normal.  Thick white discharge,  copious  Musculoskeletal: Normal range of motion. She exhibits no edema.  Neurological: She is alert and oriented to person, place, and time.  Skin: Skin is warm and dry. No rash noted.  Psychiatric: She has a normal mood and affect.   Dilation: Closed Effacement (%): Thick Cervical Position: Posterior Exam by:: Ernestina Patches,  MD Exterior is 1cm but unable to enter internal os  MAU Course  Procedures  MDM UA- small amt hgb UCx- pending Wet prep- +wbcs FFN- negative  GC-pending  NST 150/mod/+accels, no decels Toco: Quiet  Given flexeril in MAU which did not help.  Encouraged water intake Percocet x2 given- improved/relieved pain  Assessment and Plan  Emily Morales is a 25 y.o. G2P1001 at [redacted]w[redacted]d presenting with abdominal and back pain. ddx includes preterm labor- less likely given closed internal os and negative FFN. Unlikely UTI or pyelo in the setting of being afebrile and relatively benign UA. Unlikely intraabdominal pathology given benign adbominal exam. Could be MSK pain related to 3rd trimester of pregnancy.  Abruption is on ddx but is highly unlikely given lack of vaginal bleeding and category 1 tracing.   - Rx for Percocet #15  - Ambien 5mg  #5 given to support sleep - Follow up in office Monday or Tuesday - Strict return precautions  Emily Morales 08/01/2015, 1:36 AM

## 2015-08-02 LAB — URINE CULTURE: SPECIAL REQUESTS: NORMAL

## 2015-08-03 ENCOUNTER — Ambulatory Visit (INDEPENDENT_AMBULATORY_CARE_PROVIDER_SITE_OTHER): Payer: 59 | Admitting: Obstetrics & Gynecology

## 2015-08-03 ENCOUNTER — Encounter: Payer: Self-pay | Admitting: Obstetrics & Gynecology

## 2015-08-03 VITALS — BP 120/70 | HR 78 | Wt 210.0 lb

## 2015-08-03 DIAGNOSIS — F329 Major depressive disorder, single episode, unspecified: Secondary | ICD-10-CM

## 2015-08-03 DIAGNOSIS — F32A Depression, unspecified: Secondary | ICD-10-CM

## 2015-08-03 DIAGNOSIS — M549 Dorsalgia, unspecified: Secondary | ICD-10-CM

## 2015-08-03 DIAGNOSIS — O9989 Other specified diseases and conditions complicating pregnancy, childbirth and the puerperium: Secondary | ICD-10-CM

## 2015-08-03 DIAGNOSIS — O99891 Other specified diseases and conditions complicating pregnancy: Secondary | ICD-10-CM

## 2015-08-03 DIAGNOSIS — O26899 Other specified pregnancy related conditions, unspecified trimester: Secondary | ICD-10-CM

## 2015-08-03 DIAGNOSIS — Z1389 Encounter for screening for other disorder: Secondary | ICD-10-CM

## 2015-08-03 DIAGNOSIS — Z331 Pregnant state, incidental: Secondary | ICD-10-CM

## 2015-08-03 LAB — POCT URINALYSIS DIPSTICK
Glucose, UA: NEGATIVE
KETONES UA: NEGATIVE
LEUKOCYTES UA: NEGATIVE
Nitrite, UA: NEGATIVE
PROTEIN UA: NEGATIVE
RBC UA: NEGATIVE

## 2015-08-03 LAB — GC/CHLAMYDIA PROBE AMP (~~LOC~~) NOT AT ARMC
CHLAMYDIA, DNA PROBE: NEGATIVE
NEISSERIA GONORRHEA: NEGATIVE

## 2015-08-03 MED ORDER — CYCLOBENZAPRINE HCL 10 MG PO TABS: 10.0000 mg | ORAL_TABLET | Freq: Three times a day (TID) | ORAL | Status: DC | PRN

## 2015-08-03 MED ORDER — ESCITALOPRAM OXALATE 10 MG PO TABS: 10.0000 mg | ORAL_TABLET | Freq: Every day | ORAL | Status: DC

## 2015-08-03 NOTE — Progress Notes (Signed)
Work in [employer] with increasing problems with usual activities, emotional lability and occasional crying spells Has had some issues in the past NO SI or thoughts of hurting herself  FHR 145    ICD-9-CM ICD-10-CM   1. Depression 311 F32.9   2. Pregnant state, incidental V22.2 Z33.1 POCT urinalysis dipstick  3. Screening for genitourinary condition V81.6 Z13.89 POCT urinalysis dipstick  4. Back pain in pregnancy 646.80 O26.899    724.5     Meds ordered this encounter  Medications  . escitalopram (LEXAPRO) 10 MG tablet    Sig: Take 1 tablet (10 mg total) by mouth daily.    Dispense:  30 tablet    Refill:  2  . cyclobenzaprine (FLEXERIL) 10 MG tablet    Sig: Take 1 tablet (10 mg total) by mouth every 8 (eight) hours as needed for muscle spasms.    Dispense:  30 tablet    Refill:  1      Face to face time:  15 minutes  Greater than 50% of the visit time was spent in counseling and coordination of care with the patient.  The summary and outline of the counseling and care coordination is summarized in the note above.   All questions were answered.

## 2015-08-09 ENCOUNTER — Other Ambulatory Visit: Payer: 59

## 2015-08-09 ENCOUNTER — Encounter: Payer: Self-pay | Admitting: Obstetrics & Gynecology

## 2015-08-09 ENCOUNTER — Encounter: Payer: 59 | Admitting: Obstetrics and Gynecology

## 2015-08-09 ENCOUNTER — Ambulatory Visit (INDEPENDENT_AMBULATORY_CARE_PROVIDER_SITE_OTHER): Payer: 59 | Admitting: Obstetrics & Gynecology

## 2015-08-09 VITALS — BP 126/60 | HR 78 | Wt 209.0 lb

## 2015-08-09 DIAGNOSIS — Z131 Encounter for screening for diabetes mellitus: Secondary | ICD-10-CM

## 2015-08-09 DIAGNOSIS — Z331 Pregnant state, incidental: Secondary | ICD-10-CM

## 2015-08-09 DIAGNOSIS — Z1389 Encounter for screening for other disorder: Secondary | ICD-10-CM

## 2015-08-09 DIAGNOSIS — Z369 Encounter for antenatal screening, unspecified: Secondary | ICD-10-CM

## 2015-08-09 DIAGNOSIS — Z3493 Encounter for supervision of normal pregnancy, unspecified, third trimester: Secondary | ICD-10-CM

## 2015-08-09 DIAGNOSIS — Z36 Encounter for antenatal screening of mother: Secondary | ICD-10-CM | POA: Diagnosis not present

## 2015-08-09 LAB — POCT URINALYSIS DIPSTICK
GLUCOSE UA: NEGATIVE
KETONES UA: NEGATIVE
Leukocytes, UA: NEGATIVE
Nitrite, UA: NEGATIVE

## 2015-08-09 NOTE — Progress Notes (Signed)
G2P1001 [redacted]w[redacted]d Estimated Date of Delivery: 10/29/15  Blood pressure 126/60, pulse 78, weight 209 lb (94.802 kg), last menstrual period 01/29/2015.   BP weight and urine results all reviewed and noted.  Please refer to the obstetrical flow sheet for the fundal height and fetal heart rate documentation:  Patient reports good fetal movement, denies any bleeding and no rupture of membranes symptoms or regular contractions. Patient is without complaints. All questions were answered.  No orders of the defined types were placed in this encounter.    Plan:  Continued routine obstetrical care, PN2  Return in about 3 weeks (around 08/30/2015) for LROB.

## 2015-08-09 NOTE — Addendum Note (Signed)
Addended by: Diona Fanti A on: 08/09/2015 09:55 AM   Modules accepted: Orders

## 2015-08-10 LAB — CBC
HEMOGLOBIN: 9.6 g/dL — AB (ref 11.1–15.9)
Hematocrit: 27.7 % — ABNORMAL LOW (ref 34.0–46.6)
MCH: 28.6 pg (ref 26.6–33.0)
MCHC: 34.7 g/dL (ref 31.5–35.7)
MCV: 82 fL (ref 79–97)
Platelets: 216 10*3/uL (ref 150–379)
RBC: 3.36 x10E6/uL — AB (ref 3.77–5.28)
RDW: 14.2 % (ref 12.3–15.4)
WBC: 5.3 10*3/uL (ref 3.4–10.8)

## 2015-08-10 LAB — GLUCOSE TOLERANCE, 2 HOURS W/ 1HR
GLUCOSE, 1 HOUR: 143 mg/dL (ref 65–179)
GLUCOSE, 2 HOUR: 107 mg/dL (ref 65–152)
Glucose, Fasting: 80 mg/dL (ref 65–91)

## 2015-08-10 LAB — RPR: RPR: NONREACTIVE

## 2015-08-10 LAB — HIV ANTIBODY (ROUTINE TESTING W REFLEX): HIV SCREEN 4TH GENERATION: NONREACTIVE

## 2015-08-10 LAB — ANTIBODY SCREEN: Antibody Screen: NEGATIVE

## 2015-08-11 DIAGNOSIS — Z029 Encounter for administrative examinations, unspecified: Secondary | ICD-10-CM

## 2015-08-13 ENCOUNTER — Telehealth: Payer: Self-pay | Admitting: Adult Health

## 2015-08-13 NOTE — Telephone Encounter (Signed)
Spoke with pt letting her know a trace of protein is nothing to be concerned about. If her BP was elevated and she had more protein in urine, that would be a concern, but I reassured pt that everything was fine at this point. Pt voiced understanding. [place]

## 2015-08-16 ENCOUNTER — Ambulatory Visit (HOSPITAL_COMMUNITY): Payer: 59 | Attending: Orthopedic Surgery | Admitting: Physical Therapy

## 2015-08-16 DIAGNOSIS — M25562 Pain in left knee: Secondary | ICD-10-CM | POA: Insufficient documentation

## 2015-08-16 DIAGNOSIS — M6281 Muscle weakness (generalized): Secondary | ICD-10-CM | POA: Insufficient documentation

## 2015-08-16 DIAGNOSIS — M249 Joint derangement, unspecified: Secondary | ICD-10-CM | POA: Diagnosis not present

## 2015-08-16 DIAGNOSIS — R262 Difficulty in walking, not elsewhere classified: Secondary | ICD-10-CM | POA: Diagnosis not present

## 2015-08-16 NOTE — Patient Instructions (Signed)
   MEDICINE BALL BRIDGE  While lying on your back, raise your buttocks off the floor/bed while holding a medicine ball between your knees as shown.  Repeat 10 times, twice a day.    STRAIGHT LEG RAISE - SLR EXTERNAL ROTATION  While lying or sitting, raise up your leg with a straight knee and your toes pointed outward.  Repeat 5 times each side, twice a day.    LONG ARC QUAD - LAQ - HIGH SEAT  While seated with your knee in a bent position, slowly straighten your knee as you raise your foot upwards as shown.  You may use a 1-2 pound ankle weight as long as it is not causing any pain.  Repeat 10 times each side, twice a day.

## 2015-08-16 NOTE — Therapy (Signed)
[place], [place], [address] Phone: [phone]   Fax:  [phone]  Physical Therapy Evaluation  Patient Details  Name: Emily Morales MRN: [id number] Date of Birth: 24-Jul-1991 Referring Provider: Dr. Aline Brochure   Encounter Date: 08/16/2015      PT End of Session - 08/16/15 1021    Visit Number 1   Number of Visits 8   Date for PT Re-Evaluation 09/13/15   Authorization Type [employer] ([facility] [employer])   Authorization Time Period 08/16/15 to 10/14/15   PT Start Time [phone]  patient arrived exactly at 9:30 for eval, few minutes to get packet back(   PT Stop Time 1008  low complexity eval    PT Time Calculation (min) 30 min   Activity Tolerance Patient tolerated treatment well   Behavior During Therapy [facility] for tasks assessed/performed      Past Medical History  Diagnosis Date  . Ovarian cyst, right   . Abnormal Pap smear   . Hx of chlamydia infection   . Headache(784.0)   . Uterine fibroid     Past Surgical History  Procedure Laterality Date  . Colposcopy    . Lipoma surgery      There were no vitals filed for this visit.  Visit Diagnosis:  Left anterior knee pain - Plan: PT plan of care cert/re-cert  Muscle weakness - Plan: PT plan of care cert/re-cert  Difficulty walking - Plan: PT plan of care cert/re-cert  Knee joint hypermobility - Plan: PT plan of care cert/re-cert      Subjective Assessment - 08/16/15 0939    Subjective Left knee can be fairly painful and can feel stiff as well, has been popping as well. Has also been swelling up a bit especially yesterday after she tripped over her dog (did not hit floor). If its bent it starts to hurt, if its stretched out its fine.    Pertinent History Patient reports that her knee starting hurting about a year ago, just started hurting with no injury or event; can hurt so bad that she is not able to do much of anything. Was a track running in [place].  Elevation and heat to cold make it feel better. No specifics on what makes it feel worse.    How long can you sit comfortably? no real limits if it is stretched out    How long can you stand comfortably? not long (due to pregnancy)   How long can you walk comfortably?  not long (due to pregnancy)   Patient Stated Goals pain management for knee    Currently in Pain? Yes   Pain Score 2    Pain Location Knee   Pain Orientation Left;Anterior            [facility] PT Assessment - 08/16/15 0001    Assessment   Medical Diagnosis anterior L knee pain    Referring Provider Dr. Aline Brochure    Onset Date/Surgical Date --  about a year ago    Next MD Visit PRN    Precautions   Precautions Other (comment)  pregnant    Restrictions   Weight Bearing Restrictions No   Balance Screen   Has the patient fallen in the past 6 months No   Has the patient had a decrease in activity level because of a fear of falling?  Yes   Is the patient reluctant to leave their home because of a fear of falling?  Yes  Prior Function   Level of Independence Independent;Independent with basic ADLs;Independent with gait;Independent with transfers   Vocation Full time employment   Vocation Requirements registration department, lots of sitting    Leisure would like to get back to daily exercise    Observation/Other Assessments   Observations positive disco test L; negative ACL/LCL/PCL/MCL tests    Focus on Therapeutic Outcomes (FOTO)  59% limited    Posture/Postural Control   Posture Comments classic pregnancy pose with increased lumbar lordosis   AROM   Left Knee Extension -10  genu recurvatum    Left Knee Flexion 121   Strength   Right Hip Flexion --  DNT- uinable due to pregnancy    Right Hip ABduction 3-/5   Left Hip Flexion --  DNT- unable due to pregnancy    Left Hip ABduction 3/5   Right Knee Flexion 4-/5   Right Knee Extension 4-/5   Left Knee Flexion 4-/5   Left Knee Extension 3/5   Right Ankle  Dorsiflexion 3+/5   Left Ankle Dorsiflexion 3+/5   Ambulation/Gait   Gait Comments increased lumbar lordosis, hip ER, pronation, reduced stance time L/step length R                            PT Education - 08/16/15 1020    Education provided Yes   Education Details prognosis, HEP, plan of care, possible role of pregnancy-related laxity in joint pain    Person(s) Educated Patient   Methods Explanation;Demonstration;Handout;Verbal cues   Comprehension Verbalized understanding;Returned demonstration;Need further instruction          PT Short Term Goals - 08/16/15 1028    PT SHORT TERM GOAL #1   Title Patient to report that she is experiencing no more than 3/10 pain at worst in order to improve her function around her home and improve overall ability to participate in light exercise during late stages of her pregnancy    Time 2   Period Weeks   Status New   PT SHORT TERM GOAL #2   Title Patient to demonstrate improved gait mechanics including equal step lengths, reduced pronation, and reduced hip ER in order to reduce stress/pain on knee and improve efficiency of gait    Time 2   Period Weeks   Status New   PT SHORT TERM GOAL #3   Title Patient to correctly and consistently perform appropriate HEP, to be updated PRN    Time 1   Period Weeks   Status New           PT Long Term Goals - 08/16/15 1031    PT LONG TERM GOAL #1   Title Patient to demonstrate at least 4+/5 strength in all tested muscles in order to reduce pain, improve regional stability, and enhance overall function    Time 4   Period Weeks   Status New   PT LONG TERM GOAL #2   Title Patient to demonstrate a reduction in genu recurvatum by at least 5 degrees secondary to improved muscle strength and regional stability in order to improve overall mechanics and reduce pain    Time 4   Period Weeks   Status New   PT LONG TERM GOAL #3   Title Patient to be participating in regular exercise  program, at least 3 days per week for at least 20 minutes, based on activities that are appropriate for her stage of pregnancy, in order to maintain  functional gains and assist in transition to post-delivery exercise program   Time 4   Period Weeks   Status New               Plan - 08/16/15 1022    Clinical Impression Statement Patient arrives with long standing L anterior knee pain of approximately one year; she also arrives in latter trimester of pregnancy with a reported due date of March 31st. Upon examination patient does reveal postural deficits (many of which appear to be related to late stages of pregnancy), impaired gait mechanics, L knee genu recurvatum, and signficnat muscle weakness noted. Disco test also positive today however MCL/LCL/ACL/PCL tests negative; no tender areas noted or effusion noted around knee with palpation today. At this time suspect that pregnancy-related tissue laxity may by exacerbating pain in knee as well.  Recommend services of skilled PT services in order to address functinoal limitations and to assist in reaching optimal level of function.    Pt will benefit from skilled therapeutic intervention in order to improve on the following deficits Abnormal gait;Decreased strength;Pain;Difficulty walking;Improper body mechanics;Hypermobility;Postural dysfunction   Rehab Potential Excellent   PT Frequency 2x / week   PT Duration 4 weeks   PT Treatment/Interventions ADLs/Self Care Home Management;Gait training;Therapeutic activities;Therapeutic exercise;Patient/family education;Manual techniques;Balance training;Neuromuscular re-education   PT Next Visit Plan review HEP and goals; functional strengthening with emphasis on VMO. Avoid prone positions and valsalva due to pregnancy.    PT Home Exercise Plan given    Consulted and Agree with Plan of Care Patient         Problem List Patient Active Problem List   Diagnosis Date Noted  . Supervision of normal  pregnancy 04/19/2015  . Abnormal Pap smear of cervix 04/19/2015  . Mild dysplasia of cervix 03/26/2014    Deniece Ree PT, DPT [facility] [address] [place], [place], [address] Phone: [phone]   Fax:  [phone]  Name: Emily Morales MRN: [id number] Date of Birth: 08-Apr-1991

## 2015-08-17 ENCOUNTER — Ambulatory Visit (INDEPENDENT_AMBULATORY_CARE_PROVIDER_SITE_OTHER): Payer: 59 | Admitting: Women's Health

## 2015-08-17 ENCOUNTER — Inpatient Hospital Stay (HOSPITAL_COMMUNITY)
Admission: AD | Admit: 2015-08-17 | Discharge: 2015-08-17 | Disposition: A | Discharge status: Home / Self Care | Payer: 59 | Source: Ambulatory Visit | Attending: Family Medicine | Admitting: Family Medicine

## 2015-08-17 ENCOUNTER — Encounter: Payer: Self-pay | Admitting: Women's Health

## 2015-08-17 ENCOUNTER — Encounter (HOSPITAL_COMMUNITY): Payer: Self-pay | Admitting: *Deleted

## 2015-08-17 ENCOUNTER — Telehealth: Payer: Self-pay | Admitting: *Deleted

## 2015-08-17 VITALS — BP 110/58 | HR 100 | Temp 98.3°F | Wt 206.0 lb

## 2015-08-17 DIAGNOSIS — O26893 Other specified pregnancy related conditions, third trimester: Secondary | ICD-10-CM | POA: Diagnosis not present

## 2015-08-17 DIAGNOSIS — O219 Vomiting of pregnancy, unspecified: Secondary | ICD-10-CM

## 2015-08-17 DIAGNOSIS — Z331 Pregnant state, incidental: Secondary | ICD-10-CM | POA: Diagnosis not present

## 2015-08-17 DIAGNOSIS — R34 Anuria and oliguria: Secondary | ICD-10-CM

## 2015-08-17 DIAGNOSIS — Z3A29 29 weeks gestation of pregnancy: Secondary | ICD-10-CM | POA: Diagnosis not present

## 2015-08-17 DIAGNOSIS — R8290 Unspecified abnormal findings in urine: Secondary | ICD-10-CM

## 2015-08-17 DIAGNOSIS — R1110 Vomiting, unspecified: Secondary | ICD-10-CM | POA: Diagnosis not present

## 2015-08-17 DIAGNOSIS — Z1389 Encounter for screening for other disorder: Secondary | ICD-10-CM

## 2015-08-17 DIAGNOSIS — Z3493 Encounter for supervision of normal pregnancy, unspecified, third trimester: Secondary | ICD-10-CM

## 2015-08-17 DIAGNOSIS — E860 Dehydration: Secondary | ICD-10-CM | POA: Insufficient documentation

## 2015-08-17 DIAGNOSIS — J069 Acute upper respiratory infection, unspecified: Secondary | ICD-10-CM | POA: Insufficient documentation

## 2015-08-17 LAB — POCT URINALYSIS DIPSTICK
GLUCOSE UA: NEGATIVE
Leukocytes, UA: NEGATIVE
Nitrite, UA: NEGATIVE

## 2015-08-17 LAB — COMPREHENSIVE METABOLIC PANEL
ALT: 12 U/L — AB (ref 14–54)
AST: 18 U/L (ref 15–41)
Albumin: 3.2 g/dL — ABNORMAL LOW (ref 3.5–5.0)
Alkaline Phosphatase: 91 U/L (ref 38–126)
Anion gap: 9 (ref 5–15)
BILIRUBIN TOTAL: 0.6 mg/dL (ref 0.3–1.2)
BUN: 6 mg/dL (ref 6–20)
CALCIUM: 8.9 mg/dL (ref 8.9–10.3)
CO2: 23 mmol/L (ref 22–32)
Chloride: 104 mmol/L (ref 101–111)
Creatinine, Ser: 0.38 mg/dL — ABNORMAL LOW (ref 0.44–1.00)
Glucose, Bld: 105 mg/dL — ABNORMAL HIGH (ref 65–99)
POTASSIUM: 3 mmol/L — AB (ref 3.5–5.1)
Sodium: 136 mmol/L (ref 135–145)
Total Protein: 6.9 g/dL (ref 6.5–8.1)

## 2015-08-17 LAB — URINE MICROSCOPIC-ADD ON

## 2015-08-17 LAB — URINALYSIS, ROUTINE W REFLEX MICROSCOPIC
Bilirubin Urine: NEGATIVE
GLUCOSE, UA: 500 mg/dL — AB
KETONES UR: 40 mg/dL — AB
LEUKOCYTES UA: NEGATIVE
NITRITE: NEGATIVE
PH: 6.5 (ref 5.0–8.0)
Protein, ur: NEGATIVE mg/dL
SPECIFIC GRAVITY, URINE: 1.015 (ref 1.005–1.030)

## 2015-08-17 LAB — CBC
HEMATOCRIT: 27.7 % — AB (ref 36.0–46.0)
Hemoglobin: 9.2 g/dL — ABNORMAL LOW (ref 12.0–15.0)
MCH: 28 pg (ref 26.0–34.0)
MCHC: 33.2 g/dL (ref 30.0–36.0)
MCV: 84.2 fL (ref 78.0–100.0)
Platelets: 175 10*3/uL (ref 150–400)
RBC: 3.29 MIL/uL — ABNORMAL LOW (ref 3.87–5.11)
RDW: 14.2 % (ref 11.5–15.5)
WBC: 5.2 10*3/uL (ref 4.0–10.5)

## 2015-08-17 MED ORDER — LACTATED RINGERS IV BOLUS (SEPSIS)
1000.0000 mL | Freq: Once | INTRAVENOUS | Status: AC
  Administered 2015-08-17: 1000 mL via INTRAVENOUS

## 2015-08-17 MED ORDER — DM-GUAIFENESIN ER 30-600 MG PO TB12
1.0000 | ORAL_TABLET | Freq: Once | ORAL | Status: AC
  Administered 2015-08-17: 1 via ORAL
  Filled 2015-08-17: qty 1

## 2015-08-17 MED ORDER — DEXTROSE 5 % IN LACTATED RINGERS IV BOLUS
1000.0000 mL | Freq: Once | INTRAVENOUS | Status: AC
  Administered 2015-08-17: 1000 mL via INTRAVENOUS

## 2015-08-17 MED ORDER — GUAIFENESIN ER 600 MG PO TB12: 600.0000 mg | ORAL_TABLET | Freq: Two times a day (BID) | ORAL | Status: DC

## 2015-08-17 MED ORDER — AZITHROMYCIN 250 MG PO TABS: ORAL_TABLET | ORAL | Status: DC

## 2015-08-17 NOTE — MAU Note (Addendum)
Pt sent from [facility] for IV fluids, pt is dehydrated, has been vomiting since Sunday, also has cough & congestion.  Denies abd pain, bleeding or LOF.  Pt has been unable to urinate since yesterday around 1800, had a cath urine done @ [facility].

## 2015-08-17 NOTE — Progress Notes (Signed)
[facility] Visit  Patient name: Emily Morales MRN [id number]  Date of birth: [birth year]  CC & HPI:  Emily Morales is a 26 y.o. G70P1001 African American female at [redacted]w[redacted]d presenting today for report of not being able to void since 1800 last night, has drank 3 bottles of water today and still has no urge to void, has tried sitting on toilet and running water w/o success. She also reports cough productive of thick yellow-green phlegm, chest congestion, sore throat, bilateral ear pain x 3 weeks not improving. Temperature was 100.6 yesterday when she checked it. Denies ha or body aches. Has been taking alka seltzer plus but not helping. Works at [employer] so exposed to many w/ illnesses. Some vomiting since Sunday, she thinks from coughing so much.   Reports good fm, denies uc's/cramping, vb or lof. Has been having some pressure.  Patient's last menstrual period was 01/29/2015 (exact date).  Pertinent History Reviewed:  Medical & Surgical Hx:   Past Medical History  Diagnosis Date  . Ovarian cyst, right   . Abnormal Pap smear   . Hx of chlamydia infection   . Headache(784.0)   . Uterine fibroid    Past Surgical History  Procedure Laterality Date  . Colposcopy    . Lipoma surgery     Medications: Reviewed & Updated - see associated section Social History: Reviewed -  reports that she has never smoked. She has never used smokeless tobacco.  Objective Findings:  Vitals: BP 110/58 mmHg  Pulse 100  Wt 206 lb (93.441 kg)  LMP 01/29/2015 (Exact Date) Body mass index is 34.28 kg/(m^2). Temp 98.3  Physical Examination: General appearance - alert, mild distress Ears: bilateral TM w/ erythema and +fluid Throat: non-erythematous, no exudate HRRR LCTAB Abdomen - FH 30cm, FHR 160 Bladder: Sterile I&O cath of app 100cc dark concentrated urine Pelvic - cx visually closed, SVE LTC  Results for orders placed or performed in visit on 08/17/15 (from the past 24  hour(s))  POCT urinalysis dipstick   Collection Time: 08/17/15  2:47 PM  Result Value Ref Range   Color, UA     Clarity, UA     Glucose, UA neg    Bilirubin, UA     Ketones, UA large    Spec Grav, UA     Blood, UA small    pH, UA     Protein, UA 1+    Urobilinogen, UA     Nitrite, UA neg    Leukocytes, UA Negative Negative     Assessment & Plan:  A:   [redacted]w[redacted]d Pregnant  URI  Dehydration w/ oligouria   P:  Discussed w/ [facility], will send to [facility] for [facility] rehydration, called Dr. Si Raider and MAU provider to notify  Rx zpak  Push po fluids, enough so urine clear  F/U as scheduled, if worsening/not improving- call for sooner appt  Tawnya Crook CNM, [facility] 08/17/2015 2:52 PM

## 2015-08-17 NOTE — MAU Provider Note (Signed)
History     CSN: [id number]  Arrival date and time: 08/17/15 1550   First Provider Initiated Contact with Patient 08/17/15 1633      Chief Complaint  Patient presents with  . Emesis During Pregnancy  . Cough   HPI Pt is G2P1001 at [redacted]w[redacted]d sent from [facility] today for IV fluids for dehydration.  Pt has had productive cough with Green sputum and nasal congestion for 2 weeks.  alkaSeltzer cold and sinus has not been helping. Pt has sore throat.  Pt was not able to urinate when at [facility] and catherized urine showing dehydration.  Pt works at [employer] and exposed to many infections. Pt denies spotting or bleeding or LOF RN note: PM   Expand All Collapse All   Pt sent from [facility] for IV fluids, pt is dehydrated, has been vomiting since Sunday, also has cough & congestion. Denies abd pain, bleeding or LOF. Pt has been unable to urinate since yesterday around 1800, had a cath urine done @ [facility].       Past Medical History  Diagnosis Date  . Ovarian cyst, right   . Abnormal Pap smear   . Hx of chlamydia infection   . Headache(784.0)   . Uterine fibroid     Past Surgical History  Procedure Laterality Date  . Colposcopy    . Lipoma surgery      Family History  Problem Relation Age of Onset  . Diabetes Father   . Hypertension Father   . Cancer Maternal Grandmother     lung  . Heart disease Paternal Grandmother   . Other Son     reactive airway disease  . Asthma Son   . Heart murmur Son     Social History  Substance Use Topics  . Smoking status: Never Smoker   . Smokeless tobacco: Never Used  . Alcohol Use: No     Comment: occ    Allergies:  Allergies  Allergen Reactions  . Pollen Extract Cough  . Latex Rash  . Other Rash    Coconut causes a rash    Prescriptions prior to admission  Medication Sig Dispense Refill Last Dose  . ferrous sulfate 325 (65 FE) MG tablet Take 1 tablet (325 mg total) by mouth 2 (two) times daily  with a meal. 60 tablet 3 08/16/2015 at Unknown time  . phenylephrine-shark liver oil-mineral oil-petrolatum (PREPARATION H) 0.25-3-14-71.9 % rectal ointment Place 1 application rectally 2 (two) times daily as needed for hemorrhoids.   08/16/2015 at Unknown time  . Prenatal Vit-Fe Fumarate-FA (MULTIVITAMIN-PRENATAL) 27-0.8 MG TABS tablet Take 1 tablet by mouth daily at 12 noon.   08/16/2015 at Unknown time  . zolpidem (AMBIEN) 5 MG tablet Take 1 tablet (5 mg total) by mouth at bedtime as needed for sleep. 5 tablet 0 Past Week at Unknown time  . azithromycin (ZITHROMAX Z-PAK) 250 MG tablet Use as directed (Patient not taking: Reported on 08/17/2015) 6 each 0 Not Taking at Unknown time  . cyclobenzaprine (FLEXERIL) 10 MG tablet Take 1 tablet (10 mg total) by mouth every 8 (eight) hours as needed for muscle spasms. 30 tablet 1 08/15/2015  . escitalopram (LEXAPRO) 10 MG tablet Take 1 tablet (10 mg total) by mouth daily. 30 tablet 2 08/14/2015  . oxyCODONE-acetaminophen (PERCOCET/ROXICET) 5-325 MG tablet Take 1-2 tablets by mouth every 6 (six) hours as needed. (Patient taking differently: Take 1-2 tablets by mouth every 6 (six) hours as needed for  moderate pain. ) 15 tablet 0 08/15/2015    Review of Systems  Constitutional: Positive for chills. Negative for fever.  HENT: Positive for congestion and sore throat.   Respiratory: Positive for sputum production.   Gastrointestinal: Positive for nausea. Negative for vomiting and abdominal pain.  Genitourinary: Negative for dysuria.   Physical Exam   Blood pressure 109/65, pulse 132, temperature 98.4 F (36.9 C), temperature source Oral, resp. rate 18, last menstrual period 01/29/2015.  Physical Exam  Nursing note and vitals reviewed. Constitutional: She is oriented to person, place, and time. She appears well-developed and well-nourished. No distress.  HENT:  Head: Normocephalic.  Mouth/Throat: No oropharyngeal exudate.  Pharynx mildly reddened without  erythema  Eyes: Pupils are equal, round, and reactive to light.  Neck: Normal range of motion. Neck supple.  Cardiovascular: Normal rate.   Respiratory: Effort normal.  GI: Soft.  Musculoskeletal: Normal range of motion.  Neurological: She is alert and oriented to person, place, and time.  Skin: Skin is warm and dry.  Psychiatric: She has a normal mood and affect.    MAU Course  Procedures IV D5LR 1 liter IV LR 1 liter Pt able to eat and drink PO fluids Able to void QS without difficulty after fluids Results for orders placed or performed during the hospital encounter of 08/17/15 (from the past 24 hour(s))  CBC     Status: Abnormal   Collection Time: 08/17/15  5:00 PM  Result Value Ref Range   WBC 5.2 4.0 - 10.5 K/uL   RBC 3.29 (L) 3.87 - 5.11 MIL/uL   Hemoglobin 9.2 (L) 12.0 - 15.0 g/dL   HCT 27.7 (L) 36.0 - 46.0 %   MCV 84.2 78.0 - 100.0 fL   MCH 28.0 26.0 - 34.0 pg   MCHC 33.2 30.0 - 36.0 g/dL   RDW 14.2 11.5 - 15.5 %   Platelets 175 150 - 400 K/uL  Comprehensive metabolic panel     Status: Abnormal   Collection Time: 08/17/15  5:00 PM  Result Value Ref Range   Sodium 136 135 - 145 mmol/L   Potassium 3.0 (L) 3.5 - 5.1 mmol/L   Chloride 104 101 - 111 mmol/L   CO2 23 22 - 32 mmol/L   Glucose, Bld 105 (H) 65 - 99 mg/dL   BUN 6 6 - 20 mg/dL   Creatinine, Ser 0.38 (L) 0.44 - 1.00 mg/dL   Calcium 8.9 8.9 - 10.3 mg/dL   Total Protein 6.9 6.5 - 8.1 g/dL   Albumin 3.2 (L) 3.5 - 5.0 g/dL   AST 18 15 - 41 U/L   ALT 12 (L) 14 - 54 U/L   Alkaline Phosphatase 91 38 - 126 U/L   Total Bilirubin 0.6 0.3 - 1.2 mg/dL   GFR calc non Af Amer >60 >60 mL/min   GFR calc Af Amer >60 >60 mL/min   Anion gap 9 5 - 15  Urinalysis, Routine w reflex microscopic (not at [facility])     Status: Abnormal   Collection Time: 08/17/15  6:24 PM  Result Value Ref Range   Color, Urine YELLOW YELLOW   APPearance HAZY (A) CLEAR   Specific Gravity, Urine 1.015 1.005 - 1.030   pH 6.5 5.0 - 8.0   Glucose,  UA 500 (A) NEGATIVE mg/dL   Hgb urine dipstick SMALL (A) NEGATIVE   Bilirubin Urine NEGATIVE NEGATIVE   Ketones, ur 40 (A) NEGATIVE mg/dL   Protein, ur NEGATIVE NEGATIVE mg/dL   Nitrite NEGATIVE NEGATIVE  Leukocytes, UA NEGATIVE NEGATIVE  Urine microscopic-add on     Status: Abnormal   Collection Time: 08/17/15  6:24 PM  Result Value Ref Range   Squamous Epithelial / LPF 6-30 (A) NONE SEEN   WBC, UA 0-5 0 - 5 WBC/hpf   RBC / HPF 0-5 0 - 5 RBC/hpf   Bacteria, UA FEW (A) NONE SEEN  Mucinex DM given PO NST reactive with baseline 135 bpm and 15x15 accelerations- no ctx Pt ate a meal and took PO fluids Assessment and Plan  Pregnancy 3rd trimester dehydration URI- pt has Rx for zithromax; Rx for Mucinex DM sent to pharmacy F/u with OB appointment at [facility] Increase PO fluids Out of work note for 2 days [place] 08/17/2015, 7:22 PM

## 2015-08-17 NOTE — Telephone Encounter (Signed)
Pt c/o coughing, congestion, unable to urinate. Pt states she has been pushing water but has not voiding since 7 pm last night. Pt given an appt for 2:15 pm for evaluation.

## 2015-08-18 LAB — URINE CULTURE: Organism ID, Bacteria: NO GROWTH

## 2015-08-25 ENCOUNTER — Encounter: Payer: Self-pay | Admitting: Obstetrics and Gynecology

## 2015-08-25 ENCOUNTER — Ambulatory Visit (HOSPITAL_COMMUNITY): Payer: 59

## 2015-08-27 ENCOUNTER — Ambulatory Visit (HOSPITAL_COMMUNITY): Payer: 59

## 2015-08-27 VITALS — BP 106/68

## 2015-08-27 DIAGNOSIS — R262 Difficulty in walking, not elsewhere classified: Secondary | ICD-10-CM

## 2015-08-27 DIAGNOSIS — M25562 Pain in left knee: Secondary | ICD-10-CM | POA: Diagnosis not present

## 2015-08-27 DIAGNOSIS — M6281 Muscle weakness (generalized): Secondary | ICD-10-CM

## 2015-08-27 DIAGNOSIS — M249 Joint derangement, unspecified: Secondary | ICD-10-CM | POA: Diagnosis not present

## 2015-08-27 NOTE — Therapy (Signed)
[place], [place], [address] Phone: [phone]   Fax:  [phone]  Physical Therapy Treatment  Patient Details  Name: Emily Morales MRN: [id number] Date of Birth: [birth year] Referring Provider: Dr. Aline Brochure   Encounter Date: 08/27/2015      PT End of Session - 08/27/15 0942    Visit Number 2   Number of Visits 8   Date for PT Re-Evaluation 09/13/15   Authorization Type [employer] ([facility] [employer])   Authorization Time Period 08/16/15 to 10/14/15   PT Start Time 0850   PT Stop Time 0935   PT Time Calculation (min) 45 min   Activity Tolerance Patient tolerated treatment well;No increased pain   Behavior During Therapy [facility] for tasks assessed/performed      Past Medical History  Diagnosis Date  . Ovarian cyst, right   . Abnormal Pap smear   . Hx of chlamydia infection   . Headache(784.0)   . Uterine fibroid     Past Surgical History  Procedure Laterality Date  . Colposcopy    . Lipoma surgery      Filed Vitals:   08/27/15 0859  BP: 106/68    Visit Diagnosis:  Left anterior knee pain  Muscle weakness  Difficulty walking  Knee joint hypermobility      Subjective Assessment - 08/27/15 0859    Subjective Pt denied L knee pain upon arrival but noted that her R knee "popped" the other day and experienced minor soreness of the R knee. Pt denied pain or soreness of the R knee. L knee pain ranges betweebn a 0-3/10 on a VAS.    Pertinent History Patient reports that her knee starting hurting about a year ago, just started hurting with no injury or event; can hurt so bad that she is not able to do much of anything. Was a track running in [place]. Elevation and heat to cold make it feel better. No specifics on what makes it feel worse.    Limitations Standing;Walking   How long can you sit comfortably? no real limits if it is stretched out    How long can you stand comfortably? 30 minutes    How long  can you walk comfortably? 1 mile    Patient Stated Goals Pt's main goal is to eliminate her L knee pain.    Currently in Pain? No/denies   Pain Score 0-No pain   Pain Location Knee   Pain Orientation Left;Anterior   Pain Score 0   Pain Location Knee  R knee pain has ranged between 0-7/10 on a VAS   Pain Orientation Right   Pain Descriptors / Indicators Aching   Pain Frequency Intermittent                         [facility] Adult PT Treatment/Exercise - 08/27/15 0001    Knee/Hip Exercises: Aerobic   Recumbent Bike 7 minutes on level 1    Knee/Hip Exercises: Standing   Heel Raises --   Knee Flexion 2 sets;10 reps;Strengthening   Knee Flexion Limitations 3# with B UE support on plinth   Terminal Knee Extension 1 set;Strengthening;20 reps;Left   Theraband Level (Terminal Knee Extension) Level 2 (Red)   Lateral Step Up 1 set;10 reps;Step Height: 4"; Left   Forward Step Up 1 set;10 reps;Step Height: 4";Left   Wall Squat 2 sets;10 reps  with use of physioball in corner of room    Knee/Hip  Exercises: Seated   Long Arc Quad Strengthening;2 sets;10 reps;Other (comment)  with yellow ball squeeze to activate VMO Left   Long Arc Quad Weight --  3#   Manual Therapy   Manual therapy comments Completed at the end of PT tx    Joint Mobilization L patellar mobs in S<>I direction, using grades I-III   Soft tissue mobilization STM to bilateral quads/ITB with use of rolling stick    Passive ROM L knee PROM into extension/flexion x 15 reps                 PT Education - 08/27/15 0941    Education provided Yes   Education Details Educated pt on current HEP, use of cryotherapy (15-20 minutes 3-4x/day), and to avoid genurecurvatum in standing    Person(s) Educated Patient   Methods Explanation;Demonstration   Comprehension Verbalized understanding;Returned demonstration;Need further instruction          PT Short Term Goals - 08/16/15 1028    PT SHORT TERM GOAL #1    Title Patient to report that she is experiencing no more than 3/10 pain at worst in order to improve her function around her home and improve overall ability to participate in light exercise during late stages of her pregnancy    Time 2   Period Weeks   Status New   PT SHORT TERM GOAL #2   Title Patient to demonstrate improved gait mechanics including equal step lengths, reduced pronation, and reduced hip ER in order to reduce stress/pain on knee and improve efficiency of gait    Time 2   Period Weeks   Status New   PT SHORT TERM GOAL #3   Title Patient to correctly and consistently perform appropriate HEP, to be updated PRN    Time 1   Period Weeks   Status New           PT Long Term Goals - 08/16/15 1031    PT LONG TERM GOAL #1   Title Patient to demonstrate at least 4+/5 strength in all tested muscles in order to reduce pain, improve regional stability, and enhance overall function    Time 4   Period Weeks   Status New   PT LONG TERM GOAL #2   Title Patient to demonstrate a reduction in genu recurvatum by at least 5 degrees secondary to improved muscle strength and regional stability in order to improve overall mechanics and reduce pain    Time 4   Period Weeks   Status New   PT LONG TERM GOAL #3   Title Patient to be participating in regular exercise program, at least 3 days per week for at least 20 minutes, based on activities that are appropriate for her stage of pregnancy, in order to maintain functional gains and assist in transition to post-delivery exercise program   Time 4   Period Weeks   Status New               Plan - 08/27/15 [id number]    Clinical Impression Statement PT tx focused on HS/Quad strengthening in standing and seated position in order to improve L knee stability. Pt denied pain t/o today's PT tx. Added and completed L patellar mobs and STM to bilateral quads and ITB with minor tenderness assessed at L distal quad insertion. Tenderness was reduced  s/p manual therapy techniques. Discussed and reviewed the importance of avoiding genu recurvatum while completing ther ex in order to reduce stress on ligamentous structures  of the L knee. Pt verbalized full understanding. Verbal and tactile cues provided during step-ups, squats, and TKE in standing in order to avoid genu recurvatum. Pt demo good technique with all ther ex once cues were provided. Pt would benefit from continued skilled PT in order to further improve L knee stability and reduce L knee pain.    Pt will benefit from skilled therapeutic intervention in order to improve on the following deficits Abnormal gait;Decreased strength;Pain;Difficulty walking;Improper body mechanics;Hypermobility;Postural dysfunction   Rehab Potential Excellent   Clinical Impairments Affecting Rehab Potential Currently pregnant   PT Frequency 2x / week   PT Duration 4 weeks   PT Treatment/Interventions ADLs/Self Care Home Management;Gait training;Therapeutic activities;Therapeutic exercise;Patient/family education;Manual techniques;Balance training;Neuromuscular re-education;Passive range of motion   PT Next Visit Plan Continue with current POC with focus on functional  HS/quad strengthening with emphasis on VMO. Avoid prone positions and valsalva due to pregnancy.    PT Home Exercise Plan Reviewed HEP with no changes made this visit. Progress and add additional VMO strengthening ther ex  to HEP at next PT visit.    Consulted and Agree with Plan of Care Patient        Problem List Patient Active Problem List   Diagnosis Date Noted  . Oligouria 08/17/2015  . Dehydration 08/17/2015  . Supervision of normal pregnancy 04/19/2015  . Abnormal Pap smear of cervix 04/19/2015  . Mild dysplasia of cervix 03/26/2014    Garen Lah, PT, DPT     08/27/2015, 9:58 AM  [place], [place], [address] Phone: [phone]   Fax:  [phone]  Name:  Emily Morales MRN: [id number] Date of Birth: 1991/03/03

## 2015-08-30 ENCOUNTER — Ambulatory Visit (INDEPENDENT_AMBULATORY_CARE_PROVIDER_SITE_OTHER): Payer: 59 | Admitting: Women's Health

## 2015-08-30 ENCOUNTER — Encounter: Payer: Self-pay | Admitting: Women's Health

## 2015-08-30 VITALS — BP 104/60 | HR 96 | Wt 213.0 lb

## 2015-08-30 DIAGNOSIS — O26893 Other specified pregnancy related conditions, third trimester: Secondary | ICD-10-CM

## 2015-08-30 DIAGNOSIS — Z1389 Encounter for screening for other disorder: Secondary | ICD-10-CM

## 2015-08-30 DIAGNOSIS — N898 Other specified noninflammatory disorders of vagina: Secondary | ICD-10-CM

## 2015-08-30 DIAGNOSIS — Z3493 Encounter for supervision of normal pregnancy, unspecified, third trimester: Secondary | ICD-10-CM

## 2015-08-30 DIAGNOSIS — Z331 Pregnant state, incidental: Secondary | ICD-10-CM

## 2015-08-30 LAB — POCT URINALYSIS DIPSTICK
Blood, UA: NEGATIVE
KETONES UA: NEGATIVE
LEUKOCYTES UA: NEGATIVE
NITRITE UA: NEGATIVE

## 2015-08-30 LAB — POCT WET PREP (WET MOUNT): CLUE CELLS WET PREP WHIFF POC: NEGATIVE

## 2015-08-30 NOTE — Progress Notes (Signed)
Low-risk OB appointment [id number] [redacted]w[redacted]d Estimated Date of Delivery: 10/29/15 BP 104/60 mmHg  Pulse 96  Wt 213 lb (96.616 kg)  LMP 01/29/2015 (Exact Date)  BP, weight, and urine reviewed.  Refer to obstetrical flow sheet for FH & FHR.  Reports good fm.  Denies regular uc's, lof, vb, or uti s/s. Lots of cramping since last Tues, some pressure.  Spec exam: cx visually closed, mod amt creamy white nonodorous d/c, wet prep neg SVE: LTC Reviewed ptl s/s, fkc. Increase fluids, rest Plan:  Continue routine obstetrical care  F/U in 2wks for OB appointment

## 2015-08-31 ENCOUNTER — Ambulatory Visit (HOSPITAL_COMMUNITY): Payer: 59 | Admitting: Physical Therapy

## 2015-08-31 DIAGNOSIS — M249 Joint derangement, unspecified: Secondary | ICD-10-CM | POA: Diagnosis not present

## 2015-08-31 DIAGNOSIS — M25562 Pain in left knee: Secondary | ICD-10-CM | POA: Diagnosis not present

## 2015-08-31 DIAGNOSIS — M6281 Muscle weakness (generalized): Secondary | ICD-10-CM

## 2015-08-31 DIAGNOSIS — R262 Difficulty in walking, not elsewhere classified: Secondary | ICD-10-CM

## 2015-08-31 NOTE — Therapy (Signed)
[place], [place], [address] Phone: [phone]   Fax:  [phone]  Physical Therapy Treatment  Patient Details  Name: Emily Morales MRN: [id number] Date of Birth: [birth year] Referring Provider: Dr. Aline Brochure   Encounter Date: 08/31/2015      PT End of Session - 08/31/15 1110    Visit Number 3   Number of Visits 8   Date for PT Re-Evaluation 09/13/15   Authorization Type [employer] ([facility] [employer])   Authorization Time Period 08/16/15 to 10/14/15   PT Start Time 0933   PT Stop Time 1011   PT Time Calculation (min) 38 min   Activity Tolerance Patient tolerated treatment well;No increased pain   Behavior During Therapy [facility] for tasks assessed/performed      Past Medical History  Diagnosis Date  . Ovarian cyst, right   . Abnormal Pap smear   . Hx of chlamydia infection   . Headache(784.0)   . Uterine fibroid     Past Surgical History  Procedure Laterality Date  . Colposcopy    . Lipoma surgery      There were no vitals filed for this visit.  Visit Diagnosis:  Left anterior knee pain  Muscle weakness  Difficulty walking  Knee joint hypermobility      Subjective Assessment - 08/31/15 0935    Subjective Patient reports she is doing very well today, no pain and had a good weekend    Pertinent History Patient reports that her knee starting hurting about a year ago, just started hurting with no injury or event; can hurt so bad that she is not able to do much of anything. Was a track running in [place]. Elevation and heat to cold make it feel better. No specifics on what makes it feel worse.    Currently in Pain? No/denies                         [facility] Adult PT Treatment/Exercise - 08/31/15 0001    Knee/Hip Exercises: Standing   Heel Raises Both;1 set;20 reps   Heel Raises Limitations toe and heels    Knee Flexion 2 sets;10 reps;Strengthening   Knee Flexion Limitations 3# with B  UE support on plinth   Hip Abduction Both;1 set;10 reps   Abduction Limitations 3# weights, 2 second holds    Hip Extension Both;1 set;10 reps   Extension Limitations 3# weights    Lateral Step Up 1 set;10 reps;Step Height: 4"   Forward Step Up 1 set;10 reps;Step Height: 4";Left   Knee/Hip Exercises: Seated   Long Arc Quad Strengthening;2 sets;10 reps;Other (comment)   Manual Therapy   Manual therapy comments Completed at the end of PT tx    Soft tissue mobilization STM to bilateral ITBs with ball    Passive ROM B knee PROM into extension/flexion x 15 reps                 PT Education - 08/31/15 1109    Education provided No          PT Short Term Goals - 08/16/15 1028    PT SHORT TERM GOAL #1   Title Patient to report that she is experiencing no more than 3/10 pain at worst in order to improve her function around her home and improve overall ability to participate in light exercise during late stages of her pregnancy    Time 2   Period Weeks  Status New   PT SHORT TERM GOAL #2   Title Patient to demonstrate improved gait mechanics including equal step lengths, reduced pronation, and reduced hip ER in order to reduce stress/pain on knee and improve efficiency of gait    Time 2   Period Weeks   Status New   PT SHORT TERM GOAL #3   Title Patient to correctly and consistently perform appropriate HEP, to be updated PRN    Time 1   Period Weeks   Status New           PT Long Term Goals - 08/16/15 1031    PT LONG TERM GOAL #1   Title Patient to demonstrate at least 4+/5 strength in all tested muscles in order to reduce pain, improve regional stability, and enhance overall function    Time 4   Period Weeks   Status New   PT LONG TERM GOAL #2   Title Patient to demonstrate a reduction in genu recurvatum by at least 5 degrees secondary to improved muscle strength and regional stability in order to improve overall mechanics and reduce pain    Time 4   Period Weeks    Status New   PT LONG TERM GOAL #3   Title Patient to be participating in regular exercise program, at least 3 days per week for at least 20 minutes, based on activities that are appropriate for her stage of pregnancy, in order to maintain functional gains and assist in transition to post-delivery exercise program   Time 4   Period Weeks   Status New               Plan - 08/31/15 1111    Clinical Impression Statement Focused on functional strengthening today as well as soft tissue mobilization of bilateral ITBs, which was performed at end of session. Patient did n ot have increased pain throughout today's session hwoever at end of sesssion did report that her calves/hips are feeling rather stiff still so she may benefit from more manual  to these areas next session.    Pt will benefit from skilled therapeutic intervention in order to improve on the following deficits Abnormal gait;Decreased strength;Pain;Difficulty walking;Improper body mechanics;Hypermobility;Postural dysfunction   Rehab Potential Excellent   Clinical Impairments Affecting Rehab Potential Currently pregnant   PT Frequency 2x / week   PT Duration 4 weeks   PT Treatment/Interventions ADLs/Self Care Home Management;Gait training;Therapeutic activities;Therapeutic exercise;Patient/family education;Manual techniques;Balance training;Neuromuscular re-education;Passive range of motion   PT Next Visit Plan Continue with current POC with focus on functional  HS/quad strengthening with emphasis on VMO. Avoid prone positions and valsalva due to pregnancy. Manual to calves/hips.    PT Home Exercise Plan Reviewed HEP with no changes made this visit. Progress and add additional VMO strengthening ther ex  to HEP at next PT visit.    Consulted and Agree with Plan of Care Patient        Problem List Patient Active Problem List   Diagnosis Date Noted  . Oligouria 08/17/2015  . Dehydration 08/17/2015  . Supervision of normal  pregnancy 04/19/2015  . Abnormal Pap smear of cervix 04/19/2015  . Mild dysplasia of cervix 03/26/2014    Deniece Ree PT, DPT [facility] [address] [place], [place], [address] Phone: [phone]   Fax:  [phone]  Name: Emily Morales MRN: [id number] Date of Birth: 08-Oct-1990

## 2015-09-02 ENCOUNTER — Ambulatory Visit (HOSPITAL_COMMUNITY): Payer: 59 | Attending: Orthopedic Surgery | Admitting: Physical Therapy

## 2015-09-02 ENCOUNTER — Telehealth (HOSPITAL_COMMUNITY): Payer: Self-pay | Admitting: Physical Therapy

## 2015-09-02 NOTE — Telephone Encounter (Signed)
Called regarding missed appointment.  Pt sleeping and stated she had forgotten about her appointment.  Reminded of next appointment on 2/6 at 9:30. Teena Irani, PTA/CLT [phone]

## 2015-09-06 ENCOUNTER — Ambulatory Visit (HOSPITAL_COMMUNITY): Payer: 59 | Admitting: Physical Therapy

## 2015-09-08 ENCOUNTER — Encounter: Payer: Self-pay | Admitting: Women's Health

## 2015-09-08 ENCOUNTER — Telehealth: Payer: Self-pay | Admitting: *Deleted

## 2015-09-08 ENCOUNTER — Ambulatory Visit (INDEPENDENT_AMBULATORY_CARE_PROVIDER_SITE_OTHER): Payer: 59 | Admitting: Women's Health

## 2015-09-08 VITALS — BP 122/60 | HR 84 | Wt 213.0 lb

## 2015-09-08 DIAGNOSIS — N898 Other specified noninflammatory disorders of vagina: Secondary | ICD-10-CM | POA: Diagnosis not present

## 2015-09-08 DIAGNOSIS — K429 Umbilical hernia without obstruction or gangrene: Secondary | ICD-10-CM

## 2015-09-08 DIAGNOSIS — Z331 Pregnant state, incidental: Secondary | ICD-10-CM

## 2015-09-08 DIAGNOSIS — O4703 False labor before 37 completed weeks of gestation, third trimester: Secondary | ICD-10-CM

## 2015-09-08 DIAGNOSIS — Z1389 Encounter for screening for other disorder: Secondary | ICD-10-CM

## 2015-09-08 DIAGNOSIS — Z3493 Encounter for supervision of normal pregnancy, unspecified, third trimester: Secondary | ICD-10-CM

## 2015-09-08 LAB — POCT WET PREP (WET MOUNT): Clue Cells Wet Prep Whiff POC: NEGATIVE

## 2015-09-08 LAB — POCT URINALYSIS DIPSTICK
GLUCOSE UA: NEGATIVE
Ketones, UA: NEGATIVE
Leukocytes, UA: NEGATIVE
Nitrite, UA: NEGATIVE
Protein, UA: NEGATIVE
RBC UA: NEGATIVE

## 2015-09-08 MED ORDER — NIFEDIPINE 10 MG PO CAPS: 10.0000 mg | ORAL_CAPSULE | Freq: Three times a day (TID) | ORAL | Status: DC | PRN

## 2015-09-08 NOTE — Progress Notes (Signed)
Work-in Low-risk OB appointment G2P1001 [redacted]w[redacted]d Estimated Date of Delivery: 10/29/15 BP 122/60 mmHg  Pulse 84  Wt 213 lb (96.616 kg)  LMP 01/29/2015 (Exact Date)  BP, weight, and urine reviewed.  Refer to obstetrical flow sheet for FH & FHR.  Reports good fm.  Denies lof, vb, or uti s/s. UCs x 3 days were q 1-2hr, have been about q 20-70mins since last night and have gotten painful. Also has burning sensation in upper abd during uc- likely rectus diastasis.  Denies abnormal d/c, itching/odor/irritation. Hasn't had sex since about 5mths of pregnancy. 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  Spec exam: cx visually closed, mod amt creamy white nonodorous d/c, fFN collected, wet prep neg SVE: outer os 1cm/inner os closed/thick/ballotable, vtx- didn't send fFN  When stood up walking into my office has large umbilical hernia- not noticed when lying down. Reducible. Discussed reducing it herself/placing rolled up sock/then belly band on top. Discussed s/s incarceration/reason to seek care.  Asking about coming out of work- discussed can potentially be pregnant another 8wks, so would only have 4wks pp w/ baby. If necessary d/t ptl/cervical change, etc- will come out, but if procardia can help w/ uc's and no progression to ptl, should be ok to continue working for now.  Reviewed ptl s/s, fkc. Rx procardia 10mg  TID prn for comfort. Increase po fluids, rest, no sex for now.  Plan:  Continue routine obstetrical care  F/U as scheduled on Mon for [facility] appointment

## 2015-09-08 NOTE — Patient Instructions (Signed)
Call the office ([phone]) or go to [facility] if:  You begin to have strong, frequent contractions  Your water breaks.  Sometimes it is a big gush of fluid, sometimes it is just a trickle that keeps getting your panties wet or running down your legs  You have vaginal bleeding.  It is normal to have a small amount of spotting if your cervix was checked.   You don't feel your baby moving like normal.  If you don't, get you something to eat and drink and lay down and focus on feeling your baby move.  You should feel at least 10 movements in 2 hours.  If you don't, you should call the office or go to [facility].    Preterm Labor Information Preterm labor is when labor starts at less than 37 weeks of pregnancy. The normal length of a pregnancy is 39 to 41 weeks. CAUSES Often, there is no identifiable underlying cause as to why a woman goes into preterm labor. One of the most common known causes of preterm labor is infection. Infections of the uterus, cervix, vagina, amniotic sac, bladder, kidney, or even the lungs (pneumonia) can cause labor to start. Other suspected causes of preterm labor include:   Urogenital infections, such as yeast infections and bacterial vaginosis.   Uterine abnormalities (uterine shape, uterine septum, fibroids, or bleeding from the placenta).   A cervix that has been operated on (it may fail to stay closed).   Malformations in the fetus.   Multiple gestations (twins, triplets, and so on).   Breakage of the amniotic sac.  RISK FACTORS  Having a previous history of preterm labor.   Having premature rupture of membranes (PROM).   Having a placenta that covers the opening of the cervix (placenta previa).   Having a placenta that separates from the uterus (placental abruption).   Having a cervix that is too weak to hold the fetus in the uterus (incompetent cervix).   Having too much fluid in the amniotic sac (polyhydramnios).   Taking  illegal drugs or smoking while pregnant.   Not gaining enough weight while pregnant.   Being younger than 18 and older than 27 years old.   Having a low socioeconomic status.   Being African American. SYMPTOMS Signs and symptoms of preterm labor include:   Menstrual-like cramps, abdominal pain, or back pain.  Uterine contractions that are regular, as frequent as six in an hour, regardless of their intensity (may be mild or painful).  Contractions that start on the top of the uterus and spread down to the lower abdomen and back.   A sense of increased pelvic pressure.   A watery or bloody mucus discharge that comes from the vagina.  TREATMENT Depending on the length of the pregnancy and other circumstances, your health care provider may suggest bed rest. If necessary, there are medicines that can be given to stop contractions and to mature the fetal lungs. If labor happens before 34 weeks of pregnancy, a prolonged hospital stay may be recommended. Treatment depends on the condition of both you and the fetus.  WHAT SHOULD YOU DO IF YOU THINK YOU ARE IN PRETERM LABOR? Call your health care provider right away. You will need to go to the hospital to get checked immediately. HOW CAN YOU PREVENT PRETERM LABOR IN FUTURE PREGNANCIES? You should:   Stop smoking if you smoke.  Maintain healthy weight gain and avoid chemicals and drugs that are not necessary.  Be watchful for   any type of infection.  Inform your health care provider if you have a known history of preterm labor.   This information is not intended to replace advice given to you by your health care provider. Make sure you discuss any questions you have with your health care provider.   Document Released: 10/07/2003 Document Revised: 03/19/2013 Document Reviewed: 08/19/2012 Elsevier Interactive Patient Education 2016 [employer]  

## 2015-09-08 NOTE — Telephone Encounter (Signed)
Spoke with pt. Pt states she feels like she is having contractions. Happening every 20 minutes today. Contractions have been going on x 3 days. No leaking fluid. + baby movement. Maudie Mercury has a 3:00 appt available. Pt to be seen then. [place]

## 2015-09-10 ENCOUNTER — Telehealth: Payer: Self-pay | Admitting: *Deleted

## 2015-09-10 ENCOUNTER — Encounter: Payer: Self-pay | Admitting: Women's Health

## 2015-09-10 NOTE — Telephone Encounter (Signed)
Per Dr. Elonda Husky, [facility] to begin Maternity Leave due to Preterm contractions, pt on Procardia. Letter left at front desk for pt to pick up.   Pt informed of Labor precautions and verbalized understanding.

## 2015-09-10 NOTE — Telephone Encounter (Signed)
Pt requesting to be taken out of work due to contraction, placed on Procardia by Lou Cal, been in bed for 2 days. Pt informed she would need to discuss being taken out of work with Dr.Ferguson at her appt on 09/13/2015.   Pt states can she get a work note to excuse her from work for the weekend until she can discuss with Dr.Feguson on Monday?

## 2015-09-13 ENCOUNTER — Encounter: Payer: Self-pay | Admitting: Obstetrics and Gynecology

## 2015-09-13 ENCOUNTER — Ambulatory Visit (INDEPENDENT_AMBULATORY_CARE_PROVIDER_SITE_OTHER): Payer: 59 | Admitting: Obstetrics and Gynecology

## 2015-09-13 VITALS — BP 116/60 | HR 108 | Wt 212.0 lb

## 2015-09-13 DIAGNOSIS — O329XX0 Maternal care for malpresentation of fetus, unspecified, not applicable or unspecified: Secondary | ICD-10-CM | POA: Insufficient documentation

## 2015-09-13 DIAGNOSIS — Z331 Pregnant state, incidental: Secondary | ICD-10-CM

## 2015-09-13 DIAGNOSIS — Z3493 Encounter for supervision of normal pregnancy, unspecified, third trimester: Secondary | ICD-10-CM

## 2015-09-13 DIAGNOSIS — O329XX1 Maternal care for malpresentation of fetus, unspecified, fetus 1: Secondary | ICD-10-CM

## 2015-09-13 DIAGNOSIS — Z1389 Encounter for screening for other disorder: Secondary | ICD-10-CM

## 2015-09-13 LAB — POCT URINALYSIS DIPSTICK
Blood, UA: NEGATIVE
Glucose, UA: NEGATIVE
KETONES UA: NEGATIVE
LEUKOCYTES UA: NEGATIVE
Nitrite, UA: NEGATIVE

## 2015-09-13 NOTE — Patient Instructions (Signed)
Breech Birth WHAT IS A BREECH BIRTH?  A breech birth is when a baby is born with the buttocks or the feet first. Most babies are in a head down (vertex) position when they are born. There are three types of breech babies:   When the baby's buttocks are showing first in the birth canal (vagina) with the legs straight up and the feet at the baby's head (frank breech).   When the baby's buttocks shows first with the legs bent at the knees and the feet down near the buttocks (complete breech).   When one or both of the baby's feet are down below the buttocks (footling breech).  WHAT ARE THE RISKS OF A BREECH BIRTH?  Having a breech birth increases the risk to your baby. A breech birth may cause the following:   Umbilical cord prolapse. This is when the umbilical cord is in front of the baby before or during labor. This can cause the cord to become pinched or compressed. This can reduce the flow of blood and oxygen to the baby.  The baby getting stuck in the birth canal, which can cause injury or, rarely, death.  Injury to the nerves in the shoulder, arm, and hand (brachial plexus injury) when delivered.   Your baby being born too early (prematurely).  An increased need for a cesarean delivery. WHAT INCREASES THE RISK OF HAVING A BREECH BABY?  It is not known what causes your baby to be breech. However, risk factors that may increase your chances of having a breech baby include the following:   The mother having had several babies already.   The mother having twins or more.   The mother having a baby with certain congenital disabilities.  The mother going into labor early.  The mother having problems with her uterus, such as a tumor.  The mother having placenta problems (placenta previa) or too much or not enough fluid surrounding the baby (amniotic fluid). HOW DO I KNOW IF MY BABY IS BREECH?  There are no symptoms for you to know that your baby is breech. When you are close to  your due date, your health care provider can tell if your baby is breech by:  An abdominal or vaginal (pelvic) exam.  An ultrasound. Your health care provider may also be able to tell that your baby is breech if your baby's heartbeat is heard above your belly button.  WHAT CAN BE DONE IF MY BABY IS BREECH?  Your health care provider may try to turn the baby in your uterus. This is a procedure called external cephalic version (ECV). This is done by your health care provider. He or she will place both hands on your abdomen and gently and slowly turn the baby around.  If an ECV is done, it is done toward the end of a healthy pregnancy. The baby may remain in this position or he or she may turn back to the breech position. You and your health care provider will discuss if an ECV is recommended for you and your baby.  HOW WILL I DELIVER MY BABY IF MY BABY IS BREECH?  You and your health care provider will discuss the best way to deliver your baby. If your baby is breech, it is less likely that a vaginal delivery will be recommended due to the risks. Some breech babies may be delivered safely without a cesarean, while in other cases health care providers will recommend a cesarean delivery.    This  information is not intended to replace advice given to you by your health care provider. Make sure you discuss any questions you have with your health care provider.   Document Released: 09/07/2006 Document Revised: 04/07/2015 Document Reviewed: 05/21/2014 Elsevier Interactive Patient Education [employer].

## 2015-09-13 NOTE — Telephone Encounter (Signed)
Per out oral conversation

## 2015-09-13 NOTE — Progress Notes (Signed)
Pt states that her only concern is the contractions that she has at times.

## 2015-09-13 NOTE — Progress Notes (Signed)
Patient ID: Emily Morales, female   DOB: 1990-08-28, 25 y.o.   MRN: [id number]  G2P1001 [redacted]w[redacted]d Estimated Date of Delivery: 10/29/15  Blood pressure 116/60, pulse 108, weight 212 lb (96.163 kg), last menstrual period 01/29/2015.   refer to the ob flow sheet for FH and FHR, also BP, Wt, Urine results:notable for neg protein   Patient reports  + good fetal movement, denies any bleeding and no rupture of membranes symptoms or regular contractions. Patient complaints: None.  FHR: 152 bpm FH: 34 cm Presentation: breech  Questions were answered. Assessment: LROB G2P1001 @ [redacted]w[redacted]d,    Breech presentation  Plan:  Continued routine obstetrical care,   F/u in 2 weeks for pnx care consider VERSION if breech presisits   By signing my name below, I, Stephania Fragmin, attest that this documentation has been prepared under the direction and in the presence of Jonnie Kind, MD. Electronically Signed: Stephania Fragmin, ED Scribe. 09/13/2015. 10:43 AM.  I personally performed the services described in this documentation, which was SCRIBED in my presence. The recorded information has been reviewed and considered accurate. It has been edited as necessary during review. Jonnie Kind, MD

## 2015-09-21 ENCOUNTER — Inpatient Hospital Stay (HOSPITAL_COMMUNITY)
Admission: AD | Admit: 2015-09-21 | Discharge: 2015-09-21 | Disposition: A | Discharge status: Home / Self Care | Payer: 59 | Source: Ambulatory Visit | Attending: Family Medicine | Admitting: Family Medicine

## 2015-09-21 ENCOUNTER — Inpatient Hospital Stay (HOSPITAL_COMMUNITY): Payer: 59

## 2015-09-21 DIAGNOSIS — Z3A33 33 weeks gestation of pregnancy: Secondary | ICD-10-CM | POA: Diagnosis not present

## 2015-09-21 DIAGNOSIS — O4703 False labor before 37 completed weeks of gestation, third trimester: Secondary | ICD-10-CM

## 2015-09-21 DIAGNOSIS — O4700 False labor before 37 completed weeks of gestation, unspecified trimester: Secondary | ICD-10-CM | POA: Diagnosis not present

## 2015-09-21 DIAGNOSIS — R109 Unspecified abdominal pain: Secondary | ICD-10-CM | POA: Diagnosis not present

## 2015-09-21 DIAGNOSIS — Z3A34 34 weeks gestation of pregnancy: Secondary | ICD-10-CM

## 2015-09-21 DIAGNOSIS — O9989 Other specified diseases and conditions complicating pregnancy, childbirth and the puerperium: Secondary | ICD-10-CM | POA: Diagnosis not present

## 2015-09-21 DIAGNOSIS — O26899 Other specified pregnancy related conditions, unspecified trimester: Secondary | ICD-10-CM

## 2015-09-21 LAB — URINALYSIS, ROUTINE W REFLEX MICROSCOPIC
Bilirubin Urine: NEGATIVE
GLUCOSE, UA: NEGATIVE mg/dL
Ketones, ur: NEGATIVE mg/dL
LEUKOCYTES UA: NEGATIVE
Nitrite: NEGATIVE
PH: 6.5 (ref 5.0–8.0)
PROTEIN: NEGATIVE mg/dL
Specific Gravity, Urine: 1.02 (ref 1.005–1.030)

## 2015-09-21 LAB — URINE MICROSCOPIC-ADD ON

## 2015-09-21 MED ORDER — NIFEDIPINE 10 MG PO CAPS: 20.0000 mg | ORAL_CAPSULE | Freq: Three times a day (TID) | ORAL | Status: DC | PRN

## 2015-09-21 NOTE — Discharge Instructions (Signed)

## 2015-09-21 NOTE — MAU Provider Note (Signed)
Chief Complaint:  Contractions   First Provider Initiated Contact with Patient 09/21/15 1918      HPI: Emily Morales is a 25 y.o. G2P1001 at [redacted]w[redacted]d who presents to maternity admissions reporting contractions x 3 weeks, worsening yesterday but staying consistently 5-8 minutes apart, causing mostly back pain.  Nothing makes the pain better or worse.  She is currently on Procardia Q 4-6 hour but it is not helping.  She reports the baby was breech on [place] last week and she was 1 cm in the office.  She reports good fetal movement, denies LOF, vaginal bleeding, vaginal itching/burning, urinary symptoms, h/a, dizziness, n/v, or fever/chills.    HPI  Past Medical History: Past Medical History  Diagnosis Date  . Ovarian cyst, right   . Abnormal Pap smear   . Hx of chlamydia infection   . Headache(784.0)   . Uterine fibroid     Past obstetric history: OB History  Gravida Para Term Preterm AB SAB TAB Ectopic Multiple Living  2 1 1  0 0 0 0 0 0 1    # Outcome Date GA Lbr Len/2nd Weight Sex Delivery Anes PTL Lv  2 Current           1 Term 04/25/12 [redacted]w[redacted]d 13:20 / 00:16 7 lb 12.7 oz (3.535 kg) M Vag-Spont EPI  Y     Comments: WNL      Past Surgical History: Past Surgical History  Procedure Laterality Date  . Colposcopy    . Lipoma surgery      Family History: Family History  Problem Relation Age of Onset  . Diabetes Father   . Hypertension Father   . Cancer Maternal Grandmother     lung  . Heart disease Paternal Grandmother   . Other Son     reactive airway disease  . Asthma Son   . Heart murmur Son     Social History: Social History  Substance Use Topics  . Smoking status: Never Smoker   . Smokeless tobacco: Never Used  . Alcohol Use: No     Comment: occ    Allergies:  Allergies  Allergen Reactions  . Pollen Extract Cough  . Latex Rash  . Other Rash    Coconut causes a rash    Meds:  Prescriptions prior to admission  Medication Sig Dispense Refill Last Dose   . cyclobenzaprine (FLEXERIL) 10 MG tablet Take 1 tablet (10 mg total) by mouth every 8 (eight) hours as needed for muscle spasms. 30 tablet 1 09/20/2015 at Unknown time  . Prenatal Vit-Fe Fumarate-FA (MULTIVITAMIN-PRENATAL) 27-0.8 MG TABS tablet Take 1 tablet by mouth daily at 12 noon.   Past Week at Unknown time  . [DISCONTINUED] NIFEdipine (PROCARDIA) 10 MG capsule Take 1 capsule (10 mg total) by mouth 3 (three) times daily as needed. 30 capsule 1 09/21/2015 at 1500  . ferrous sulfate 325 (65 FE) MG tablet Take 1 tablet (325 mg total) by mouth 2 (two) times daily with a meal. (Patient not taking: Reported on 09/21/2015) 60 tablet 3 Not Taking at Unknown time  . zolpidem (AMBIEN) 5 MG tablet Take 1 tablet (5 mg total) by mouth at bedtime as needed for sleep. (Patient not taking: Reported on 09/21/2015) 5 tablet 0 Not Taking at Unknown time    ROS:  Review of Systems  Constitutional: Negative for fever, chills and fatigue.  Eyes: Negative for visual disturbance.  Respiratory: Negative for shortness of breath.   Cardiovascular: Negative for chest pain.  Gastrointestinal: Positive for abdominal pain. Negative for nausea and vomiting.  Genitourinary: Positive for pelvic pain. Negative for dysuria, flank pain, vaginal bleeding, vaginal discharge, difficulty urinating and vaginal pain.  Musculoskeletal: Positive for back pain.  Neurological: Negative for dizziness and headaches.  Psychiatric/Behavioral: Negative.      I have reviewed patient's Past Medical Hx, Surgical Hx, Family Hx, Social Hx, medications and allergies.   Physical Exam   Patient Vitals for the past 24 hrs:  BP Temp Temp src Pulse Resp  09/21/15 1901 121/72 mmHg 98.1 F (36.7 C) Oral 97 18   Constitutional: Well-developed, well-nourished female in no acute distress.  Cardiovascular: normal rate Respiratory: normal effort GI: Abd soft, non-tender, gravid appropriate for gestational age.  MS: Extremities nontender, no edema,  normal ROM Neurologic: Alert and oriented x 4.  GU: Neg CVAT.   Dilation: 1 Effacement (%): Thick Cervical Position: Posterior Presentation: Vertex (confirmed by u/s) Exam by:: L Leftwich Kirby CNM  FHT:  Baseline 140 , moderate variability, accelerations present, no decelerations Contractions: q 5-7 mins, mild to palpation   Labs: Results for orders placed or performed during the hospital encounter of 09/21/15 (from the past 24 hour(s))  Urinalysis, Routine w reflex microscopic (not at [facility])     Status: Abnormal   Collection Time: 09/21/15  6:50 PM  Result Value Ref Range   Color, Urine YELLOW YELLOW   APPearance CLEAR CLEAR   Specific Gravity, Urine 1.020 1.005 - 1.030   pH 6.5 5.0 - 8.0   Glucose, UA NEGATIVE NEGATIVE mg/dL   Hgb urine dipstick SMALL (A) NEGATIVE   Bilirubin Urine NEGATIVE NEGATIVE   Ketones, ur NEGATIVE NEGATIVE mg/dL   Protein, ur NEGATIVE NEGATIVE mg/dL   Nitrite NEGATIVE NEGATIVE   Leukocytes, UA NEGATIVE NEGATIVE  Urine microscopic-add on     Status: Abnormal   Collection Time: 09/21/15  6:50 PM  Result Value Ref Range   Squamous Epithelial / LPF 0-5 (A) NONE SEEN   WBC, UA 0-5 0 - 5 WBC/hpf   RBC / HPF 0-5 0 - 5 RBC/hpf   Bacteria, UA FEW (A) NONE SEEN   O/Positive/-- (09/19 1000)  Imaging:  Preliminary report of Limited OB US with Cephalic fetal position, normal fluid and FHR   MAU Course/MDM: No evidence of preterm labor with no cervical change in 1.5 hours in MAU.  Fetus cephalic on exam, confirmed by [place].  FHR tracing Category I with accelerations.  Pt given option of staying for reexam in another hour or d/c home.  Pt prefers to go home at this time, return if contractions are stronger/closer together.  Follow up this week with [facility].  Pt stable at time of discharge.  Assessment: 1. Threatened preterm labor, third trimester   2. Abdominal pain affecting pregnancy     Plan: Discharge home Labor precautions and fetal kick  counts      Follow-up Information    Follow up with [facility].   Why:  Call tomorrow for appt this week, Return to MAU as needed for emergencies   Contact information:   [place] SSN-[id number] [phone]       Medication List    STOP taking these medications        ferrous sulfate 325 (65 FE) MG tablet     zolpidem 5 MG tablet  Commonly known as:  AMBIEN      TAKE these medications        cyclobenzaprine  10 MG tablet  Commonly known as:  FLEXERIL  Take 1 tablet (10 mg total) by mouth every 8 (eight) hours as needed for muscle spasms.     multivitamin-prenatal 27-0.8 MG Tabs tablet  Take 1 tablet by mouth daily at 12 noon.     NIFEdipine 10 MG capsule  Commonly known as:  PROCARDIA  Take 2 capsules (20 mg total) by mouth 3 (three) times daily as needed.        Fatima Blank Certified Nurse-Midwife 09/21/2015 9:00 PM

## 2015-09-21 NOTE — MAU Note (Addendum)
States has had preterm contractions and been on procardia for about 3 weeks. Last dose 3PM today. States today, UC's seem closer and more intense. States was 1cm. Baby is BREECH.

## 2015-09-22 ENCOUNTER — Ambulatory Visit (INDEPENDENT_AMBULATORY_CARE_PROVIDER_SITE_OTHER): Payer: 59 | Admitting: Obstetrics and Gynecology

## 2015-09-22 VITALS — BP 130/80 | HR 94 | Wt 212.6 lb

## 2015-09-22 DIAGNOSIS — Z331 Pregnant state, incidental: Secondary | ICD-10-CM

## 2015-09-22 DIAGNOSIS — Z1389 Encounter for screening for other disorder: Secondary | ICD-10-CM

## 2015-09-22 DIAGNOSIS — Z3A35 35 weeks gestation of pregnancy: Secondary | ICD-10-CM

## 2015-09-22 DIAGNOSIS — Z3493 Encounter for supervision of normal pregnancy, unspecified, third trimester: Secondary | ICD-10-CM

## 2015-09-22 DIAGNOSIS — O2343 Unspecified infection of urinary tract in pregnancy, third trimester: Secondary | ICD-10-CM

## 2015-09-22 LAB — POCT URINALYSIS DIPSTICK
Glucose, UA: NEGATIVE
LEUKOCYTES UA: NEGATIVE
NITRITE UA: NEGATIVE
PROTEIN UA: 1
RBC UA: 1

## 2015-09-22 MED ORDER — NITROFURANTOIN MONOHYD MACRO 100 MG PO CAPS: 100.0000 mg | ORAL_CAPSULE | Freq: Two times a day (BID) | ORAL | Status: DC

## 2015-09-22 NOTE — Progress Notes (Signed)
Patient ID: Emily Morales, female   DOB: [birth year], 25 y.o.   MRN: [id number] G2P1001 [redacted]w[redacted]d Estimated Date of Delivery: 10/29/15  Blood pressure 130/80, pulse 94, weight 212 lb 9.6 oz (96.435 kg), last menstrual period 01/29/2015.   refer to the ob flow sheet for FH and FHR, also BP, Wt, Urine results:notable for presence of 1+ blood, 1+ protein.   Patient reports  + good fetal movement, denies any bleeding and no rupture of membranes symptoms or regular contractions. FHR: 159 FH: 36cm Patient complaints: Pt complains of intermittent, worsening contractions onset 3 weeks ago and worsening yesterday. Associated Sx include frequency. Pt notes she was seen for the same at the [facility] yesterday. Pt reports she is currently on Procardia Q 4-6 hour x 2 weeks.   Questions were answered. Assessment:   1. LROB G2P1001 @ [redacted]w[redacted]d  2. UTI treated with Macrodantin   Plan:   1. Continued routine obstetrical care 2. Start macrodantin for UTI  3. F/u in scheduled appointment on 09/27/15 for continued LROB care.    By signing my name below, I, Terressa Koyanagi, attest that this documentation has been prepared under the direction and in the presence of Mallory Shirk, MD. Electronically Signed: Terressa Koyanagi, ED Scribe. 09/22/2015. 10:26 AM.   I personally performed the services described in this documentation, which was SCRIBED in my presence. The recorded information has been reviewed and considered accurate. It has been edited as necessary during review. Jonnie Kind, MD

## 2015-09-22 NOTE — Patient Instructions (Signed)

## 2015-09-25 ENCOUNTER — Encounter (HOSPITAL_COMMUNITY): Payer: Self-pay

## 2015-09-25 ENCOUNTER — Inpatient Hospital Stay (HOSPITAL_COMMUNITY)
Admission: AD | Admit: 2015-09-25 | Discharge: 2015-09-25 | Disposition: A | Discharge status: Home / Self Care | Payer: 59 | Source: Ambulatory Visit | Attending: Obstetrics & Gynecology | Admitting: Obstetrics & Gynecology

## 2015-09-25 DIAGNOSIS — O479 False labor, unspecified: Secondary | ICD-10-CM

## 2015-09-25 DIAGNOSIS — Z91040 Latex allergy status: Secondary | ICD-10-CM | POA: Diagnosis not present

## 2015-09-25 DIAGNOSIS — Z3A35 35 weeks gestation of pregnancy: Secondary | ICD-10-CM | POA: Insufficient documentation

## 2015-09-25 DIAGNOSIS — Z91048 Other nonmedicinal substance allergy status: Secondary | ICD-10-CM | POA: Diagnosis not present

## 2015-09-25 DIAGNOSIS — Z91018 Allergy to other foods: Secondary | ICD-10-CM | POA: Diagnosis not present

## 2015-09-25 DIAGNOSIS — O4703 False labor before 37 completed weeks of gestation, third trimester: Secondary | ICD-10-CM | POA: Insufficient documentation

## 2015-09-25 DIAGNOSIS — N898 Other specified noninflammatory disorders of vagina: Secondary | ICD-10-CM | POA: Diagnosis not present

## 2015-09-25 DIAGNOSIS — Z3493 Encounter for supervision of normal pregnancy, unspecified, third trimester: Secondary | ICD-10-CM

## 2015-09-25 LAB — URINE MICROSCOPIC-ADD ON: Bacteria, UA: NONE SEEN

## 2015-09-25 LAB — URINALYSIS, ROUTINE W REFLEX MICROSCOPIC
BILIRUBIN URINE: NEGATIVE
Glucose, UA: NEGATIVE mg/dL
KETONES UR: NEGATIVE mg/dL
Leukocytes, UA: NEGATIVE
Nitrite: NEGATIVE
Protein, ur: NEGATIVE mg/dL
SPECIFIC GRAVITY, URINE: 1.01 (ref 1.005–1.030)
pH: 6 (ref 5.0–8.0)

## 2015-09-25 NOTE — Discharge Instructions (Signed)
Braxton Hicks Contractions °Contractions of the uterus can occur throughout pregnancy. Contractions are not always a sign that you are in labor.  °WHAT ARE BRAXTON HICKS CONTRACTIONS?  °Contractions that occur before labor are called Braxton Hicks contractions, or false labor. Toward the end of pregnancy (32-34 weeks), these contractions can develop more often and may become more forceful. This is not true labor because these contractions do not result in opening (dilatation) and thinning of the cervix. They are sometimes difficult to tell apart from true labor because these contractions can be forceful and people have different pain tolerances. You should not feel embarrassed if you go to the hospital with false labor. Sometimes, the only way to tell if you are in true labor is for your health care provider to look for changes in the cervix. °If there are no prenatal problems or other health problems associated with the pregnancy, it is completely safe to be sent home with false labor and await the onset of true labor. °HOW CAN YOU TELL THE DIFFERENCE BETWEEN TRUE AND FALSE LABOR? °False Labor °· The contractions of false labor are usually shorter and not as hard as those of true labor.   °· The contractions are usually irregular.   °· The contractions are often felt in the front of the lower abdomen and in the groin.   °· The contractions may go away when you walk around or change positions while lying down.   °· The contractions get weaker and are shorter lasting as time goes on.   °· The contractions do not usually become progressively stronger, regular, and closer together as with true labor.   °True Labor °· Contractions in true labor last 30-70 seconds, become very regular, usually become more intense, and increase in frequency.   °· The contractions do not go away with walking.   °· The discomfort is usually felt in the top of the uterus and spreads to the lower abdomen and low back.   °· True labor can be  determined by your health care provider with an exam. This will show that the cervix is dilating and getting thinner.   °WHAT TO REMEMBER °· Keep up with your usual exercises and follow other instructions given by your health care provider.   °· Take medicines as directed by your health care provider.   °· Keep your regular prenatal appointments.   °· Eat and drink lightly if you think you are going into labor.   °· If Braxton Hicks contractions are making you uncomfortable:   °¨ Change your position from lying down or resting to walking, or from walking to resting.   °¨ Sit and rest in a tub of warm water.   °¨ Drink 2-3 glasses of water. Dehydration may cause these contractions.   °¨ Do slow and deep breathing several times an hour.   °WHEN SHOULD I SEEK IMMEDIATE MEDICAL CARE? °Seek immediate medical care if: °· Your contractions become stronger, more regular, and closer together.   °· You have fluid leaking or gushing from your vagina.   °· You have a fever.   °· You pass blood-tinged mucus.   °· You have vaginal bleeding.   °· You have continuous abdominal pain.   °· You have low back pain that you never had before.   °· You feel your baby's head pushing down and causing pelvic pressure.   °· Your baby is not moving as much as it used to.   °  °This information is not intended to replace advice given to you by your health care provider. Make sure you discuss any questions you have with your health care   provider. °  °Document Released: 07/17/2005 Document Revised: 07/22/2013 Document Reviewed: 04/28/2013 °Elsevier Interactive Patient Education ©2016 Elsevier Inc[employer] ° °

## 2015-09-25 NOTE — MAU Provider Note (Signed)
History     CSN: [id number]  Arrival date and time: 09/25/15 1119   First Provider Initiated Contact with Patient 09/25/15 1143      Chief Complaint  Patient presents with  . Vaginal Discharge  . Contractions   HPI Patient is 25 y.o. G2P1001 [redacted]w[redacted]d here with complaints of contractions and loss of mucous plug. She reports having irregular ctx and was seen for this on 2/21 in MAU.  Lost plug yesterday. Reports ctx are irregular. Closest 5-6 minutes, usually every 20-53min.   +FM, denies LOF, VB, vaginal discharge.   OB History    Gravida Para Term Preterm AB TAB SAB Ectopic Multiple Living   2 1 1  0 0 0 0 0 0 1      Past Medical History  Diagnosis Date  . Ovarian cyst, right   . Abnormal Pap smear   . Hx of chlamydia infection   . Headache(784.0)   . Uterine fibroid     Past Surgical History  Procedure Laterality Date  . Colposcopy    . Lipoma surgery      Family History  Problem Relation Age of Onset  . Diabetes Father   . Hypertension Father   . Cancer Maternal Grandmother     lung  . Heart disease Paternal Grandmother   . Other Son     reactive airway disease  . Asthma Son   . Heart murmur Son     Social History  Substance Use Topics  . Smoking status: Never Smoker   . Smokeless tobacco: Never Used  . Alcohol Use: No     Comment: occ    Allergies:  Allergies  Allergen Reactions  . Pollen Extract Cough  . Latex Rash  . Other Rash    Coconut causes a rash    Prescriptions prior to admission  Medication Sig Dispense Refill Last Dose  . cyclobenzaprine (FLEXERIL) 10 MG tablet Take 1 tablet (10 mg total) by mouth every 8 (eight) hours as needed for muscle spasms. 30 tablet 1 09/20/2015 at Unknown time  . NIFEdipine (PROCARDIA) 10 MG capsule Take 2 capsules (20 mg total) by mouth 3 (three) times daily as needed. 30 capsule 1   . nitrofurantoin, macrocrystal-monohydrate, (MACROBID) 100 MG capsule Take 1 capsule (100 mg total) by mouth 2 (two) times  daily. For uti 14 capsule 0   . Prenatal Vit-Fe Fumarate-FA (MULTIVITAMIN-PRENATAL) 27-0.8 MG TABS tablet Take 1 tablet by mouth daily at 12 noon.   Past Week at Unknown time    Review of Systems  Constitutional: Negative for fever and chills.  Eyes: Negative for blurred vision and double vision.  Respiratory: Negative for cough and shortness of breath.   Cardiovascular: Negative for chest pain and orthopnea.  Gastrointestinal: Negative for nausea and vomiting.  Genitourinary: Negative for dysuria, frequency and flank pain.  Musculoskeletal: Negative for myalgias.  Skin: Negative for rash.  Neurological: Negative for dizziness, tingling, weakness and headaches.  Endo/Heme/Allergies: Does not bruise/bleed easily.  Psychiatric/Behavioral: Negative for depression and suicidal ideas. The patient is not nervous/anxious.    Physical Exam   Blood pressure 130/71, pulse 104, temperature 98.3 F (36.8 C), temperature source Oral, resp. rate 18, height 5\' 5"  (1.651 m), weight 212 lb (96.163 kg), last menstrual period 01/29/2015.  Physical Exam  Nursing note and vitals reviewed. Constitutional: She is oriented to person, place, and time. She appears well-developed and well-nourished. No distress.  Pregnant female  HENT:  Head: Normocephalic and atraumatic.  Eyes: Conjunctivae  are normal. No scleral icterus.  Neck: Normal range of motion. Neck supple.  Cardiovascular: Normal rate and intact distal pulses.   Respiratory: Effort normal. She exhibits no tenderness.  GI: Soft. There is no tenderness. There is no rebound and no guarding.  Gravid  Genitourinary: Vagina normal.  Musculoskeletal: Normal range of motion. She exhibits no edema.  Neurological: She is alert and oriented to person, place, and time.  Skin: Skin is warm and dry. No rash noted.  Psychiatric: She has a normal mood and affect.    Last exam on 2/21: 1/th/high, cephalic on [place]  Dilation: 1 Effacement (%): Thick Cervical  Position: Posterior Exam by:: Dr. Ernestina Patches  MAU Course  Procedures  MDM 145/mod/+accels/ no decels Toco irritable, 1 ctx (mild) while ehre  Assessment and Plan  Braxton hick contractions, no cervical change since last exam. Discharge home with labor precautions.   Juanita Craver [facility] 09/25/2015, 11:43 AM

## 2015-09-25 NOTE — MAU Note (Signed)
Irregular contractions, mucus plug coming out since yesterday morning, no vaginal bleeding, being treated for UTI., and Procardia for PTL around 9:00

## 2015-09-25 NOTE — MAU Note (Signed)
Patient presents with irregular contractions and thinks she has lost her mucus plug. MD to evaluate patient in MAU room 6.

## 2015-09-27 ENCOUNTER — Encounter: Payer: Self-pay | Admitting: Obstetrics and Gynecology

## 2015-09-27 ENCOUNTER — Ambulatory Visit (INDEPENDENT_AMBULATORY_CARE_PROVIDER_SITE_OTHER): Payer: 59 | Admitting: Obstetrics and Gynecology

## 2015-09-27 VITALS — BP 120/60 | HR 98 | Wt 214.0 lb

## 2015-09-27 DIAGNOSIS — Z3493 Encounter for supervision of normal pregnancy, unspecified, third trimester: Secondary | ICD-10-CM

## 2015-09-27 DIAGNOSIS — Z331 Pregnant state, incidental: Secondary | ICD-10-CM

## 2015-09-27 DIAGNOSIS — Z1389 Encounter for screening for other disorder: Secondary | ICD-10-CM

## 2015-09-27 LAB — POCT URINALYSIS DIPSTICK
Blood, UA: NEGATIVE
GLUCOSE UA: NEGATIVE
Ketones, UA: NEGATIVE
LEUKOCYTES UA: NEGATIVE
NITRITE UA: NEGATIVE
Protein, UA: NEGATIVE

## 2015-09-27 NOTE — Progress Notes (Signed)
Pt denies any problems or concerns at this time.  

## 2015-09-27 NOTE — Progress Notes (Signed)
Patient ID: Emily Morales, female   DOB: 1990-11-02, 25 y.o.   MRN: [id number]  G2P1001 [redacted]w[redacted]d Estimated Date of Delivery: 10/29/15  Last menstrual period 01/29/2015.   refer to the ob flow sheet for FH and FHR, also BP, Wt, Urine results:notable for negative  Patient reports  + good fetal movement, denies any bleeding and no rupture of membranes symptoms or regular contractions. Patient complaints: None.  FHR: 150 bpm  FH: 35 cm  Questions were answered. Assessment: LROB G2P1001 @ [redacted]w[redacted]d   Plan:  Continued routine obstetrical care,    Medications: D/C Procardia   F/u in 2 weeks for pnx care     By signing my name below, I, Stephania Fragmin, attest that this documentation has been prepared under the direction and in the presence of Jonnie Kind, MD. Electronically Signed: Stephania Fragmin, ED Scribe. 09/27/2015. 10:23 AM.  I personally performed the services described in this documentation, which was SCRIBED in my presence. The recorded information has been reviewed and considered accurate. It has been edited as necessary during review. Jonnie Kind, MD

## 2015-09-30 ENCOUNTER — Encounter (HOSPITAL_COMMUNITY): Payer: Self-pay | Admitting: *Deleted

## 2015-09-30 ENCOUNTER — Emergency Department (HOSPITAL_COMMUNITY)
Admission: EM | Admit: 2015-09-30 | Discharge: 2015-09-30 | Disposition: A | Discharge status: Home / Self Care | Payer: 59 | Attending: Emergency Medicine | Admitting: Emergency Medicine

## 2015-09-30 DIAGNOSIS — Z91040 Latex allergy status: Secondary | ICD-10-CM | POA: Diagnosis not present

## 2015-09-30 DIAGNOSIS — Y9389 Activity, other specified: Secondary | ICD-10-CM | POA: Diagnosis not present

## 2015-09-30 DIAGNOSIS — Z8619 Personal history of other infectious and parasitic diseases: Secondary | ICD-10-CM | POA: Insufficient documentation

## 2015-09-30 DIAGNOSIS — Z79899 Other long term (current) drug therapy: Secondary | ICD-10-CM | POA: Diagnosis not present

## 2015-09-30 DIAGNOSIS — Y998 Other external cause status: Secondary | ICD-10-CM | POA: Diagnosis not present

## 2015-09-30 DIAGNOSIS — Z792 Long term (current) use of antibiotics: Secondary | ICD-10-CM | POA: Diagnosis not present

## 2015-09-30 DIAGNOSIS — Z86018 Personal history of other benign neoplasm: Secondary | ICD-10-CM | POA: Insufficient documentation

## 2015-09-30 DIAGNOSIS — Z3A00 Weeks of gestation of pregnancy not specified: Secondary | ICD-10-CM | POA: Insufficient documentation

## 2015-09-30 DIAGNOSIS — Y9289 Other specified places as the place of occurrence of the external cause: Secondary | ICD-10-CM | POA: Insufficient documentation

## 2015-09-30 DIAGNOSIS — T162XXA Foreign body in left ear, initial encounter: Secondary | ICD-10-CM | POA: Diagnosis not present

## 2015-09-30 DIAGNOSIS — O9A219 Injury, poisoning and certain other consequences of external causes complicating pregnancy, unspecified trimester: Secondary | ICD-10-CM | POA: Diagnosis not present

## 2015-09-30 DIAGNOSIS — X58XXXA Exposure to other specified factors, initial encounter: Secondary | ICD-10-CM | POA: Insufficient documentation

## 2015-09-30 NOTE — ED Notes (Signed)
Pt states she was cleaning her ear with a q-tip this morning and the cotton came off in her ear(left).

## 2015-09-30 NOTE — ED Provider Notes (Signed)
CSN: [id number]     Arrival date & time 09/30/15  [id number] History   First MD Initiated Contact with Patient 09/30/15 [phone]     Chief Complaint  Patient presents with  . Foreign Body in Ear     (Consider location/radiation/quality/duration/timing/severity/associated sxs/prior Treatment) HPI   Emily Morales is a 25 y.o.pregnant female G2P1001, presents to the Emergency Department complaining of foreign body to left ear. She states that she was cleaning her ear with a Q-tip this morning and the cotton tip came off in her here. She reports decreased hearing since that time. She has not attempted to remove a foreign body. She denies any other symptoms.   Past Medical History  Diagnosis Date  . Ovarian cyst, right   . Abnormal Pap smear   . Hx of chlamydia infection   . Headache(784.0)   . Uterine fibroid    Past Surgical History  Procedure Laterality Date  . Colposcopy    . Lipoma surgery     Family History  Problem Relation Age of Onset  . Diabetes Father   . Hypertension Father   . Cancer Maternal Grandmother     lung  . Heart disease Paternal Grandmother   . Other Son     reactive airway disease  . Asthma Son   . Heart murmur Son    Social History  Substance Use Topics  . Smoking status: Never Smoker   . Smokeless tobacco: Never Used  . Alcohol Use: No     Comment: occ   OB History    Gravida Para Term Preterm AB TAB SAB Ectopic Multiple Living   2 1 1  0 0 0 0 0 0 1     Review of Systems  Constitutional: Negative for fever and chills.  HENT: Positive for ear pain and hearing loss (Decreased hearing left ear). Negative for ear discharge, sore throat and trouble swallowing.   Respiratory: Negative for shortness of breath.   Cardiovascular: Negative for chest pain.  Gastrointestinal: Negative for abdominal pain.  Skin: Negative for rash.  Neurological: Negative for dizziness and headaches.      Allergies  Pollen extract; Latex; and Other  Home  Medications   Prior to Admission medications   Medication Sig Start Date End Date Taking? Authorizing Provider  cyclobenzaprine (FLEXERIL) 10 MG tablet Take 1 tablet (10 mg total) by mouth every 8 (eight) hours as needed for muscle spasms. Patient not taking: Reported on 09/27/2015 08/03/15   Florian Buff, MD  NIFEdipine (PROCARDIA) 10 MG capsule Take 2 capsules (20 mg total) by mouth 3 (three) times daily as needed. Patient not taking: Reported on 09/27/2015 09/21/15   Kathie Dike Leftwich-Kirby, CNM  nitrofurantoin, macrocrystal-monohydrate, (MACROBID) 100 MG capsule Take 1 capsule (100 mg total) by mouth 2 (two) times daily. For uti 09/22/15   Jonnie Kind, MD  Prenatal Vit-Fe Fumarate-FA (MULTIVITAMIN-PRENATAL) 27-0.8 MG TABS tablet Take 1 tablet by mouth daily at 12 noon.    Historical Provider, MD  zolpidem (AMBIEN) 5 MG tablet Reported on 09/27/2015 08/02/15   Historical Provider, MD   BP 129/72 mmHg  Pulse 105  Temp(Src) 98.3 F (36.8 C) (Oral)  Resp 16  SpO2 100%  LMP 01/29/2015 (Exact Date) Physical Exam  Constitutional: She is oriented to person, place, and time. She appears well-developed and well-nourished. No distress.  HENT:  Head: Normocephalic and atraumatic.  Mouth/Throat: Oropharynx is clear and moist.  Foreign body left ear canal.  No edema or bleeding.  Neck: Normal range of motion.  Cardiovascular: Normal rate and regular rhythm.   Pulmonary/Chest: Effort normal and breath sounds normal. No respiratory distress.  Musculoskeletal: Normal range of motion.  Neurological: She is alert and oriented to person, place, and time. She exhibits normal muscle tone. Coordination normal.  Skin: Skin is warm and dry.  Psychiatric: She has a normal mood and affect.  Nursing note and vitals reviewed.   ED Course  .Foreign Body Removal Date/Time: 09/30/2015 9:38 AM Performed by: Kem Parkinson Authorized by: Kem Parkinson Consent: Verbal consent obtained. Consent given by:  patient Patient understanding: patient states understanding of the procedure being performed Patient identity confirmed: verbally with patient and arm band Time out: Immediately prior to procedure a "time out" was called to verify the correct patient, procedure, equipment, support staff and site/side marked as required. Body area: ear Anesthesia method: none. Patient sedated: no Patient restrained: no Patient cooperative: yes Localization method: visualized Removal mechanism: alligator forceps Complexity: simple 1 objects recovered. Objects recovered: cotton  Post-procedure assessment: foreign body removed Patient tolerance: Patient tolerated the procedure well with no immediate complications   (including critical care time) Labs Review Labs Reviewed - No data to display  Imaging Review No results found. I have personally reviewed and evaluated these images and lab results as part of my medical decision-making.   EKG Interpretation None       MDM   Final diagnoses:  Foreign body in ear, left, initial encounter    Patient well appearing. Tolerated procedure well. Foreign body removed completely. Hearing improved.  TM well visualized post procedure and is normal-appearing. she appears stable for discharge.    Kem Parkinson, PA-C 09/30/15 [id number]  Pattricia Boss, MD 09/30/15 [id number]

## 2015-09-30 NOTE — Discharge Instructions (Signed)
Ear Foreign Body  An ear foreign body is an object that is stuck in your ear. Objects in your ear can cause:  · Pain.  · Buzzing or roaring sounds.  · Hearing loss.  · Fluid coming from your ear (drainage) or bleeding.  · Feeling sick to your stomach (nausea) or throwing up (vomiting).  · A feeling that your ear is full.  HOME CARE  · Keep all follow-up visits as told by your doctor. This is important.  · Take medicines only as told by your doctor.  · If you were prescribed an antibiotic medicine, finish it all even if you start to feel better.  GET HELP IF:  · You have a headache.  · Your have blood coming from your ear.  · You have a fever.  · You have increased pain or swelling of your ear.  · Your hearing is reduced.  · You have discharge coming from your ear.     This information is not intended to replace advice given to you by your health care provider. Make sure you discuss any questions you have with your health care provider.     Document Released: 01/04/2010 Document Revised: 08/07/2014 Document Reviewed: 03/02/2014  Elsevier Interactive Patient Education ©2016 Elsevier Inc.

## 2015-10-01 ENCOUNTER — Telehealth: Payer: 59 | Admitting: Nurse Practitioner

## 2015-10-01 DIAGNOSIS — B373 Candidiasis of vulva and vagina: Secondary | ICD-10-CM

## 2015-10-01 DIAGNOSIS — B3731 Acute candidiasis of vulva and vagina: Secondary | ICD-10-CM

## 2015-10-01 MED ORDER — HYDROCORTISONE 2.5 % RE CREA: 1.0000 "application " | TOPICAL_CREAM | Freq: Two times a day (BID) | RECTAL | Status: DC

## 2015-10-01 MED ORDER — FLUCONAZOLE 150 MG PO TABS: 150.0000 mg | ORAL_TABLET | Freq: Once | ORAL | Status: DC

## 2015-10-01 NOTE — Addendum Note (Signed)
Addended by: Chevis Pretty on: 10/01/2015 06:34 PM   Modules accepted: Orders

## 2015-10-01 NOTE — Progress Notes (Signed)

## 2015-10-02 ENCOUNTER — Inpatient Hospital Stay (HOSPITAL_COMMUNITY)
Admission: AD | Admit: 2015-10-02 | Discharge: 2015-10-02 | Disposition: A | Discharge status: Home / Self Care | Payer: 59 | Source: Ambulatory Visit | Attending: Family Medicine | Admitting: Family Medicine

## 2015-10-02 ENCOUNTER — Encounter (HOSPITAL_COMMUNITY): Payer: Self-pay | Admitting: *Deleted

## 2015-10-02 DIAGNOSIS — K645 Perianal venous thrombosis: Secondary | ICD-10-CM

## 2015-10-02 DIAGNOSIS — Z3A36 36 weeks gestation of pregnancy: Secondary | ICD-10-CM | POA: Insufficient documentation

## 2015-10-02 DIAGNOSIS — O2243 Hemorrhoids in pregnancy, third trimester: Secondary | ICD-10-CM | POA: Diagnosis not present

## 2015-10-02 DIAGNOSIS — K5900 Constipation, unspecified: Secondary | ICD-10-CM | POA: Insufficient documentation

## 2015-10-02 DIAGNOSIS — O4703 False labor before 37 completed weeks of gestation, third trimester: Secondary | ICD-10-CM | POA: Diagnosis not present

## 2015-10-02 LAB — URINALYSIS, ROUTINE W REFLEX MICROSCOPIC
BILIRUBIN URINE: NEGATIVE
GLUCOSE, UA: NEGATIVE mg/dL
KETONES UR: NEGATIVE mg/dL
LEUKOCYTES UA: NEGATIVE
Nitrite: NEGATIVE
PH: 6 (ref 5.0–8.0)
PROTEIN: NEGATIVE mg/dL
Specific Gravity, Urine: 1.01 (ref 1.005–1.030)

## 2015-10-02 LAB — URINE MICROSCOPIC-ADD ON

## 2015-10-02 MED ORDER — HYDROCORTISONE ACE-PRAMOXINE 1-1 % RE FOAM: 1.0000 | Freq: Two times a day (BID) | RECTAL | Status: DC

## 2015-10-02 NOTE — Discharge Instructions (Signed)
Constipation  Drink plenty of fluid, preferably water, throughout the day  Eat foods high in fiber such as fruits, vegetables, and grains  Exercise, such as walking, is a good way to keep your bowels regular  Drink warm fluids, especially warm prune juice, or decaf coffee  Eat a 1/2 cup of real oatmeal (not instant), 1/2 cup applesauce, and 1/2-1 cup warm prune juice every day  If needed, you may take Colace (docusate sodium) stool softener once or twice a day to help keep the stool soft. If you are pregnant, wait until you are out of your first trimester (12-14 weeks of pregnancy)  If you still are having problems with constipation, you may take Miralax once daily as needed to help keep your bowels regular.  If you are pregnant, wait until you are out of your first trimester (12-14 weeks of pregnancy)  Call the office [phone]) or go to [facility] if:  You begin to have strong, frequent contractions  Your water breaks.  Sometimes it is a big gush of fluid, sometimes it is just a trickle that keeps getting your panties wet or running down your legs  You have vaginal bleeding.  It is normal to have a small amount of spotting if your cervix was checked.   You don't feel your baby moving like normal.  If you don't, get you something to eat and drink and lay down and focus on feeling your baby move.  You should feel at least 10 movements in 2 hours.  If you don't, you should call the office or go to [facility].      Hemorrhoids Hemorrhoids are swollen veins around the rectum or anus. There are two types of hemorrhoids:   Internal hemorrhoids. These occur in the veins just inside the rectum. They may poke through to the outside and become irritated and painful.  External hemorrhoids. These occur in the veins outside the anus and can be felt as a painful swelling or hard lump near the anus. CAUSES  Pregnancy.   Obesity.   Constipation or diarrhea.   Straining to  have a bowel movement.   Sitting for long periods on the toilet.  Heavy lifting or other activity that caused you to strain.  Anal intercourse. SYMPTOMS   Pain.   Anal itching or irritation.   Rectal bleeding.   Fecal leakage.   Anal swelling.   One or more lumps around the anus.  DIAGNOSIS  Your caregiver may be able to diagnose hemorrhoids by visual examination. Other examinations or tests that may be performed include:   Examination of the rectal area with a gloved hand (digital rectal exam).   Examination of anal canal using a small tube (scope).   A blood test if you have lost a significant amount of blood.  A test to look inside the colon (sigmoidoscopy or colonoscopy). TREATMENT Most hemorrhoids can be treated at home. However, if symptoms do not seem to be getting better or if you have a lot of rectal bleeding, your caregiver may perform a procedure to help make the hemorrhoids get smaller or remove them completely. Possible treatments include:   Placing a rubber band at the base of the hemorrhoid to cut off the circulation (rubber band ligation).   Injecting a chemical to shrink the hemorrhoid (sclerotherapy).   Using a tool to burn the hemorrhoid (infrared light therapy).   Surgically removing the hemorrhoid (hemorrhoidectomy).   Stapling the hemorrhoid to block blood flow to the  tissue (hemorrhoid stapling).  HOME CARE INSTRUCTIONS   Eat foods with fiber, such as whole grains, beans, nuts, fruits, and vegetables. Ask your doctor about taking products with added fiber in them (fibersupplements).  Increase fluid intake. Drink enough water and fluids to keep your urine clear or pale yellow.   Exercise regularly.   Go to the bathroom when you have the urge to have a bowel movement. Do not wait.   Avoid straining to have bowel movements.   Keep the anal area dry and clean. Use wet toilet paper or moist towelettes after a bowel movement.    Medicated creams and suppositories may be used or applied as directed.   Only take over-the-counter or prescription medicines as directed by your caregiver.   Take warm sitz baths for 15-20 minutes, 3-4 times a day to ease pain and discomfort.   Place ice packs on the hemorrhoids if they are tender and swollen. Using ice packs between sitz baths may be helpful.   Put ice in a plastic bag.   Place a towel between your skin and the bag.   Leave the ice on for 15-20 minutes, 3-4 times a day.   Do not use a donut-shaped pillow or sit on the toilet for long periods. This increases blood pooling and pain.  SEEK MEDICAL CARE IF:  You have increasing pain and swelling that is not controlled by treatment or medicine.  You have uncontrolled bleeding.  You have difficulty or you are unable to have a bowel movement.  You have pain or inflammation outside the area of the hemorrhoids. MAKE SURE YOU:  Understand these instructions.  Will watch your condition.  Will get help right away if you are not doing well or get worse.   This information is not intended to replace advice given to you by your health care provider. Make sure you discuss any questions you have with your health care provider.   Document Released: 07/14/2000 Document Revised: 07/03/2012 Document Reviewed: 05/21/2012 Elsevier Interactive Patient Education 2016 [employer].  Preterm Labor Information Preterm labor is when labor starts at less than 37 weeks of pregnancy. The normal length of a pregnancy is 39 to 41 weeks. CAUSES Often, there is no identifiable underlying cause as to why a woman goes into preterm labor. One of the most common known causes of preterm labor is infection. Infections of the uterus, cervix, vagina, amniotic sac, bladder, kidney, or even the lungs (pneumonia) can cause labor to start. Other suspected causes of preterm labor include:   Urogenital infections, such as yeast infections  and bacterial vaginosis.   Uterine abnormalities (uterine shape, uterine septum, fibroids, or bleeding from the placenta).   A cervix that has been operated on (it may fail to stay closed).   Malformations in the fetus.   Multiple gestations (twins, triplets, and so on).   Breakage of the amniotic sac.  RISK FACTORS  Having a previous history of preterm labor.   Having premature rupture of membranes (PROM).   Having a placenta that covers the opening of the cervix (placenta previa).   Having a placenta that separates from the uterus (placental abruption).   Having a cervix that is too weak to hold the fetus in the uterus (incompetent cervix).   Having too much fluid in the amniotic sac (polyhydramnios).   Taking illegal drugs or smoking while pregnant.   Not gaining enough weight while pregnant.   Being younger than 26 and older than 27 years old.  Having a low socioeconomic status.   Being African American. SYMPTOMS Signs and symptoms of preterm labor include:   Menstrual-like cramps, abdominal pain, or back pain.  Uterine contractions that are regular, as frequent as six in an hour, regardless of their intensity (may be mild or painful).  Contractions that start on the top of the uterus and spread down to the lower abdomen and back.   A sense of increased pelvic pressure.   A watery or bloody mucus discharge that comes from the vagina.  TREATMENT Depending on the length of the pregnancy and other circumstances, your health care provider may suggest bed rest. If necessary, there are medicines that can be given to stop contractions and to mature the fetal lungs. If labor happens before 34 weeks of pregnancy, a prolonged hospital stay may be recommended. Treatment depends on the condition of both you and the fetus.  WHAT SHOULD YOU DO IF YOU THINK YOU ARE IN PRETERM LABOR? Call your health care provider right away. You will need to go to the  hospital to get checked immediately. HOW CAN YOU PREVENT PRETERM LABOR IN FUTURE PREGNANCIES? You should:   Stop smoking if you smoke.  Maintain healthy weight gain and avoid chemicals and drugs that are not necessary.  Be watchful for any type of infection.  Inform your health care provider if you have a known history of preterm labor.   This information is not intended to replace advice given to you by your health care provider. Make sure you discuss any questions you have with your health care provider.   Document Released: 10/07/2003 Document Revised: 03/19/2013 Document Reviewed: 08/19/2012 Elsevier Interactive Patient Education [employer].

## 2015-10-02 NOTE — MAU Note (Signed)
From [id number] pt Emily Morales was charted in MR # [id number]

## 2015-10-02 NOTE — MAU Provider Note (Signed)
History     CSN: [id number]  Arrival date and time: 10/02/15 2103     Chief Complaint  Patient presents with  . Contractions   HPI  Linah Segars is a 25 yo G2P1001 at [redacted]w[redacted]d who comes in with complaints of constipation, hemorrhoid pain, and contractions. Patient states that over the last couple days she has felt the contractions become stronger. She has not felt contractions like this before. She cannot say how far apart the contractions are. Denies any vaginal bleeding or leakage of fluids. + fetal movement. Of note she was recently treated for a UTI  Past Medical History  Diagnosis Date  . Ovarian cyst, right   . Abnormal Pap smear   . Hx of chlamydia infection   . Headache(784.0)   . Uterine fibroid     Past Surgical History  Procedure Laterality Date  . Colposcopy    . Lipoma surgery      Family History  Problem Relation Age of Onset  . Diabetes Father   . Hypertension Father   . Cancer Maternal Grandmother     lung  . Heart disease Paternal Grandmother   . Other Son     reactive airway disease  . Asthma Son   . Heart murmur Son     Social History  Substance Use Topics  . Smoking status: Never Smoker   . Smokeless tobacco: Never Used  . Alcohol Use: No     Comment: occ    Allergies:  Allergies  Allergen Reactions  . Pollen Extract Cough  . Latex Rash  . Other Rash    Coconut causes a rash    Prescriptions prior to admission  Medication Sig Dispense Refill Last Dose  . cyclobenzaprine (FLEXERIL) 10 MG tablet Take 1 tablet (10 mg total) by mouth every 8 (eight) hours as needed for muscle spasms. (Patient not taking: Reported on 09/27/2015) 30 tablet 1 Not Taking at Unknown time  . fluconazole (DIFLUCAN) 150 MG tablet Take 1 tablet (150 mg total) by mouth once. 1 tablet 0   . hydrocortisone (ANUSOL-HC) 2.5 % rectal cream Place 1 application rectally 2 (two) times daily. 30 g 0   . NIFEdipine (PROCARDIA) 10 MG capsule Take 2 capsules (20 mg total)  by mouth 3 (three) times daily as needed. (Patient not taking: Reported on 09/27/2015) 30 capsule 1 Not Taking at Unknown time  . nitrofurantoin, macrocrystal-monohydrate, (MACROBID) 100 MG capsule Take 1 capsule (100 mg total) by mouth 2 (two) times daily. For uti (Patient not taking: Reported on 09/30/2015) 14 capsule 0 Completed Course at Unknown time  . Prenatal Vit-Fe Fumarate-FA (MULTIVITAMIN-PRENATAL) 27-0.8 MG TABS tablet Take 1 tablet by mouth daily at 12 noon.   09/29/2015 at Unknown time    ROS per HPI Physical Exam   Blood pressure 130/74, pulse 100, temperature 97.6 F (36.4 C), resp. rate 20, height 5\' 5"  (1.651 m), weight 100.245 kg (221 lb), last menstrual period 01/29/2015, SpO2 100 %.  Physical Exam  Constitutional: She is oriented to person, place, and time. She appears well-developed and well-nourished.  Cardiovascular: Normal rate and intact distal pulses.   Respiratory: Effort normal. No respiratory distress.  GI: She exhibits distension (gravid abdomen). There is no tenderness.  Genitourinary: Vagina normal.     Neurological: She is alert and oriented to person, place, and time.  Skin: Skin is warm and dry.  Dilation: 1 Effacement (%): Thick Cervical Position: Posterior Presentation: Undeterminable Exam by:: Glenice Bow, rnc  No pooling  of fluid on speculum exam but did have some creamy discharge  MAU Course  Procedures  MDM FHT reactive. Irregular contraction frequency.  Ferning test performed and negative.   Assessment and Plan  Jolita Martins is a 25 yo G2P1001 at [redacted]w[redacted]d presenting with complaints of contractions, constipation, and hemorrhoid pain. FHT reactive, no consistent contractions observed on monitor. Cervical exam shows 1cm dilation. Negative ferning. Patient not in labor. External thrombosed hemorrhoid without active bleeding on physical exam very tender to palpation, will follow up at [facility] clinic on 3/9. Patient given information on  constipation relief and return precautions for labor.   Carlyle Dolly 10/02/2015, 9:57 PM   Pt presented w/ report of thinking she was in labor and had leakage of fluid earlier, didn't know if her water broke. Also w/ constipation and hemorrhoids. SSE no pooling, no change w/ valsalva, fern neg. Reactive NST/Cat 1, irregular uc's. SVE 1/thick by RN exam. Has 1 external thrombosed hemorrhoid, tender to touch.  Will d/c home, to call [facility] on Monday to get appt for I&D of thrombosed hemorrhoid w/ JVF. Rx proctofofam hc. Gave printed info on constipation prevention/relief measures.  Reviewed ptl s/s, fkc, reasons to return.  Roma Schanz, CNM, [facility] 10/03/2015 5:58 AM

## 2015-10-02 NOTE — MAU Note (Addendum)
Contractions since 31 wks. Stronger last couple days and more pressure tonight. Denies bleeding. Panty liner wet one time tonight and unsure if is urine or fld. Stopped Procardia last Monday

## 2015-10-04 ENCOUNTER — Ambulatory Visit (INDEPENDENT_AMBULATORY_CARE_PROVIDER_SITE_OTHER): Payer: 59 | Admitting: Obstetrics and Gynecology

## 2015-10-04 ENCOUNTER — Encounter: Payer: Self-pay | Admitting: Obstetrics and Gynecology

## 2015-10-04 VITALS — BP 130/70 | HR 135 | Wt 218.0 lb

## 2015-10-04 DIAGNOSIS — O2243 Hemorrhoids in pregnancy, third trimester: Secondary | ICD-10-CM | POA: Diagnosis not present

## 2015-10-04 DIAGNOSIS — Z331 Pregnant state, incidental: Secondary | ICD-10-CM

## 2015-10-04 DIAGNOSIS — Z3A37 37 weeks gestation of pregnancy: Secondary | ICD-10-CM | POA: Diagnosis not present

## 2015-10-04 DIAGNOSIS — Z3483 Encounter for supervision of other normal pregnancy, third trimester: Secondary | ICD-10-CM

## 2015-10-04 DIAGNOSIS — Z3493 Encounter for supervision of normal pregnancy, unspecified, third trimester: Secondary | ICD-10-CM

## 2015-10-04 DIAGNOSIS — K645 Perianal venous thrombosis: Secondary | ICD-10-CM | POA: Diagnosis not present

## 2015-10-04 DIAGNOSIS — Z1389 Encounter for screening for other disorder: Secondary | ICD-10-CM

## 2015-10-04 NOTE — Progress Notes (Signed)
Pt worked in today for follow up from ED.

## 2015-10-04 NOTE — Patient Instructions (Signed)

## 2015-10-04 NOTE — Progress Notes (Signed)
WORK IN APPOINTMENT  Patient ID: Emily Morales, female   DOB: 01/12/1991, 25 y.o.   MRN: [id number] G2P1001 [redacted]w[redacted]d Estimated Date of Delivery: 10/29/15  Blood pressure 130/70, pulse 135, weight 218 lb (98.884 kg), last menstrual period 01/29/2015.   refer to the ob flow sheet for FH and FHR, also BP, Wt, Urine results: not done  FHR: 158 FH: 35cm  GU: 1.5 cm external thrombosed hemorrhoid with necrotic surface  tissue.    INCISION AND DRAINAGE of hemorrhoid Performed by: Mallory Shirk, MD Consent: Verbal consent obtained. Risks and benefits: risks, benefits and alternatives were discussed Type: external thrombosed hemorrhoid  Body area: anal tissue  Anesthesia: local infiltration Incision was made with a scalpel. Local anesthetic: lidocaine 1% without epinephrine Anesthetic total: 2 cc Complexity: complex Blunt dissection to break up loculations Drainage: purulent Drainage amount: moderate Packing material: no packing  Patient tolerance: Patient tolerated the procedure well with no immediate complications.   Patient reports  + good fetal movement, denies any bleeding and no rupture of membranes symptoms or regular contractions.  Patient complaints: Pt complains of worsening, painful hemorrhoids onset one week ago. Pt was seen for the same by MAU on 10/02/15 whereby a physical exam showed an external thrombosed hemorrhoid without active bleeding and tenderness to palpation. Further, during said visit pt also complained of contractions and constipation and exam findings showed: (1) a cervical exam was completed that showed 1cm dilation; (2) negative ferning. Pt was also treated for yeast vaginosis on 10/01/15 with a single Diflucan 150mg  tablet x1.   Questions were answered. 1. Assessment: LROB G2P1001 @ [redacted]w[redacted]d 2. 1.5 cm external thrombosed hemorrhoid with superficial necrotic tissue  Plan:   1. Continued routine obstetrical care 2. F/u in 1 weeks for continued ob care.  3. Soak  hemorrhoid site in warm water and apply neosporin as needed. Topical neosporin    By signing my name below, I, Terressa Koyanagi, attest that this documentation has been prepared under the direction and in the presence of Mallory Shirk, MD. Electronically Signed: Terressa Koyanagi, ED Scribe. 10/04/2015. 12:03 PM.   I personally performed the services described in this documentation, which was SCRIBED in my presence. The recorded information has been reviewed and considered accurate. It has been edited as necessary during review. Jonnie Kind, MD

## 2015-10-08 ENCOUNTER — Telehealth: Payer: Self-pay | Admitting: *Deleted

## 2015-10-08 NOTE — Telephone Encounter (Signed)
Pt states had a "bloody show when she went to restroom last night, and seeing a lot mucus, +FM, irregular contraction, a lot of pelvic pain." Per Dr. Glo Herring, nothing to be alarmed about, if vaginal bleeding occurs, pelvic pain increases call our office back or go [facility]. Pt informed to push fluids and take tylenol. Pt verbalized understanding.

## 2015-10-11 ENCOUNTER — Encounter: Payer: Self-pay | Admitting: Obstetrics and Gynecology

## 2015-10-11 ENCOUNTER — Ambulatory Visit (INDEPENDENT_AMBULATORY_CARE_PROVIDER_SITE_OTHER): Payer: 59 | Admitting: Obstetrics and Gynecology

## 2015-10-11 VITALS — BP 120/72 | HR 107 | Wt 222.0 lb

## 2015-10-11 DIAGNOSIS — Z1389 Encounter for screening for other disorder: Secondary | ICD-10-CM

## 2015-10-11 DIAGNOSIS — Z369 Encounter for antenatal screening, unspecified: Secondary | ICD-10-CM

## 2015-10-11 DIAGNOSIS — Z331 Pregnant state, incidental: Secondary | ICD-10-CM

## 2015-10-11 DIAGNOSIS — Z36 Encounter for antenatal screening of mother: Secondary | ICD-10-CM | POA: Diagnosis not present

## 2015-10-11 DIAGNOSIS — Z3493 Encounter for supervision of normal pregnancy, unspecified, third trimester: Secondary | ICD-10-CM

## 2015-10-11 LAB — POCT URINALYSIS DIPSTICK
Glucose, UA: NEGATIVE
KETONES UA: NEGATIVE
Leukocytes, UA: NEGATIVE
NITRITE UA: NEGATIVE
PROTEIN UA: NEGATIVE

## 2015-10-11 NOTE — Progress Notes (Signed)
Pt states that she has had diarrhea for the past few days with increased pressure and irregular contractions.

## 2015-10-11 NOTE — Progress Notes (Signed)
Patient ID: Emily Morales, female   DOB: 28-Jun-1991, 25 y.o.   MRN: [id number]  G2P1001 [redacted]w[redacted]d Estimated Date of Delivery: 10/29/15  Blood pressure 120/72, pulse 107, weight 222 lb (100.699 kg), last menstrual period 01/29/2015.   refer to the ob flow sheet for FH and FHR, also BP, Wt, Urine results:notable for trace blood   Patient reports  + good fetal movement, denies any bleeding and no rupture of membranes symptoms or regular contractions. Patient complaints: She complains of 4-5 episodes of diarrhea for the past few days. She states she has been taking Miralax, but she stopped taking Miralax as soon as she noticed diarrhea. She denies any new foods. She denies vomiting or blood in her stool.  Patient complains of irregular contractions, which she states occurred 9 minutes apart all day, and were getting stronger. She states when she has them, she has to sit down and breathe. Patient also notes suprapubic pressure.  Patient states she was placed on Procardia at 31 weeks, but she has since stopped.   FHR: 155 bpm FH: 37 cm Cervix: 1.5, ballotable  Questions were answered. Assessment: LROB G2P1001 @ [redacted]w[redacted]d    GBS collected.   Plan:  Continued routine obstetrical care,    F/u in 1 weeks for pnx care   By signing my name below, I, Stephania Fragmin, attest that this documentation has been prepared under the direction and in the presence of Jonnie Kind, MD. Electronically Signed: Stephania Fragmin, ED Scribe. 10/11/2015. 10:28 AM.  I personally performed the services described in this documentation, which was SCRIBED in my presence. The recorded information has been reviewed and considered accurate. It has been edited as necessary during review. Jonnie Kind, MD

## 2015-10-12 LAB — GC/CHLAMYDIA PROBE AMP
CHLAMYDIA, DNA PROBE: NEGATIVE
NEISSERIA GONORRHOEAE BY PCR: NEGATIVE

## 2015-10-13 ENCOUNTER — Encounter: Payer: Self-pay | Admitting: Obstetrics and Gynecology

## 2015-10-13 LAB — STREP GP B NAA: STREP GROUP B AG: NEGATIVE

## 2015-10-13 NOTE — Telephone Encounter (Signed)
Pt having cramping but not full labor.  Pt havning loose bm's  Will use immodium after loose bm's.

## 2015-10-15 ENCOUNTER — Ambulatory Visit (INDEPENDENT_AMBULATORY_CARE_PROVIDER_SITE_OTHER): Payer: 59 | Admitting: Obstetrics & Gynecology

## 2015-10-15 ENCOUNTER — Encounter: Payer: Self-pay | Admitting: Obstetrics & Gynecology

## 2015-10-15 VITALS — BP 122/70 | HR 100 | Wt 218.4 lb

## 2015-10-15 DIAGNOSIS — Z3493 Encounter for supervision of normal pregnancy, unspecified, third trimester: Secondary | ICD-10-CM

## 2015-10-15 DIAGNOSIS — Z331 Pregnant state, incidental: Secondary | ICD-10-CM

## 2015-10-15 DIAGNOSIS — Z1389 Encounter for screening for other disorder: Secondary | ICD-10-CM

## 2015-10-15 DIAGNOSIS — O479 False labor, unspecified: Secondary | ICD-10-CM

## 2015-10-15 LAB — POCT URINALYSIS DIPSTICK
Blood, UA: NEGATIVE
GLUCOSE UA: NEGATIVE
KETONES UA: NEGATIVE
Leukocytes, UA: NEGATIVE
Nitrite, UA: NEGATIVE

## 2015-10-15 NOTE — Addendum Note (Signed)
Addended by: Diona Fanti A on: 10/15/2015 12:26 PM   Modules accepted: Orders

## 2015-10-15 NOTE — Progress Notes (Signed)
Work in ob  Pt with hip leg and back pain, irregular contractions, spottting last night x 1 no bleeding no ROM  +FM  Cervix 2-3/th/-3  Musculoskeletal pian, no labor at this point  S/S labor and precautions reviewed

## 2015-10-16 ENCOUNTER — Encounter (HOSPITAL_COMMUNITY): Payer: Self-pay | Admitting: Certified Nurse Midwife

## 2015-10-16 ENCOUNTER — Inpatient Hospital Stay (HOSPITAL_COMMUNITY)
Admission: AD | Admit: 2015-10-16 | Discharge: 2015-10-16 | Disposition: A | Discharge status: Home / Self Care | Payer: 59 | Source: Ambulatory Visit | Attending: Obstetrics & Gynecology | Admitting: Obstetrics & Gynecology

## 2015-10-16 DIAGNOSIS — Z3A38 38 weeks gestation of pregnancy: Secondary | ICD-10-CM | POA: Diagnosis not present

## 2015-10-16 DIAGNOSIS — Z23 Encounter for immunization: Secondary | ICD-10-CM | POA: Diagnosis not present

## 2015-10-16 NOTE — Discharge Instructions (Signed)
Fetal Movement Counts °Patient Name: __________________________________________________ Patient Due Date: ____________________ °Performing a fetal movement count is highly recommended in high-risk pregnancies, but it is good for every pregnant woman to do. Your health care provider may ask you to start counting fetal movements at 28 weeks of the pregnancy. Fetal movements often increase: °· After eating a full meal. °· After physical activity. °· After eating or drinking something sweet or cold. °· At rest. °Pay attention to when you feel the baby is most active. This will help you notice a pattern of your baby's sleep and wake cycles and what factors contribute to an increase in fetal movement. It is important to perform a fetal movement count at the same time each day when your baby is normally most active.  °HOW TO COUNT FETAL MOVEMENTS °1. Find a quiet and comfortable area to sit or lie down on your left side. Lying on your left side provides the best blood and oxygen circulation to your baby. °2. Write down the day and time on a sheet of paper or in a journal. °3. Start counting kicks, flutters, swishes, rolls, or jabs in a 2-hour period. You should feel at least 10 movements within 2 hours. °4. If you do not feel 10 movements in 2 hours, wait 2-3 hours and count again. Look for a change in the pattern or not enough counts in 2 hours. °SEEK MEDICAL CARE IF: °· You feel less than 10 counts in 2 hours, tried twice. °· There is no movement in over an hour. °· The pattern is changing or taking longer each day to reach 10 counts in 2 hours. °· You feel the baby is not moving as he or she usually does. °Date: ____________ Movements: ____________ Start time: ____________ Finish time: ____________  °Date: ____________ Movements: ____________ Start time: ____________ Finish time: ____________ °Date: ____________ Movements: ____________ Start time: ____________ Finish time: ____________ °Date: ____________ Movements:  ____________ Start time: ____________ Finish time: ____________ °Date: ____________ Movements: ____________ Start time: ____________ Finish time: ____________ °Date: ____________ Movements: ____________ Start time: ____________ Finish time: ____________ °Date: ____________ Movements: ____________ Start time: ____________ Finish time: ____________ °Date: ____________ Movements: ____________ Start time: ____________ Finish time: ____________  °Date: ____________ Movements: ____________ Start time: ____________ Finish time: ____________ °Date: ____________ Movements: ____________ Start time: ____________ Finish time: ____________ °Date: ____________ Movements: ____________ Start time: ____________ Finish time: ____________ °Date: ____________ Movements: ____________ Start time: ____________ Finish time: ____________ °Date: ____________ Movements: ____________ Start time: ____________ Finish time: ____________ °Date: ____________ Movements: ____________ Start time: ____________ Finish time: ____________ °Date: ____________ Movements: ____________ Start time: ____________ Finish time: ____________  °Date: ____________ Movements: ____________ Start time: ____________ Finish time: ____________ °Date: ____________ Movements: ____________ Start time: ____________ Finish time: ____________ °Date: ____________ Movements: ____________ Start time: ____________ Finish time: ____________ °Date: ____________ Movements: ____________ Start time: ____________ Finish time: ____________ °Date: ____________ Movements: ____________ Start time: ____________ Finish time: ____________ °Date: ____________ Movements: ____________ Start time: ____________ Finish time: ____________ °Date: ____________ Movements: ____________ Start time: ____________ Finish time: ____________  °Date: ____________ Movements: ____________ Start time: ____________ Finish time: ____________ °Date: ____________ Movements: ____________ Start time: ____________ Finish  time: ____________ °Date: ____________ Movements: ____________ Start time: ____________ Finish time: ____________ °Date: ____________ Movements: ____________ Start time: ____________ Finish time: ____________ °Date: ____________ Movements: ____________ Start time: ____________ Finish time: ____________ °Date: ____________ Movements: ____________ Start time: ____________ Finish time: ____________ °Date: ____________ Movements: ____________ Start time: ____________ Finish time: ____________  °Date: ____________ Movements: ____________ Start time: ____________ Finish   time: ____________ Date: ____________ Movements: ____________ Start time: ____________ Elizebeth Koller time: ____________ Date: ____________ Movements: ____________ Start time: ____________ Elizebeth Koller time: ____________ Date: ____________ Movements: ____________ Start time: ____________ Elizebeth Koller time: ____________ Date: ____________ Movements: ____________ Start time: ____________ Elizebeth Koller time: ____________ Date: ____________ Movements: ____________ Start time: ____________ Elizebeth Koller time: ____________ Date: ____________ Movements: ____________ Start time: ____________ Elizebeth Koller time: ____________  Date: ____________ Movements: ____________ Start time: ____________ Elizebeth Koller time: ____________ Date: ____________ Movements: ____________ Start time: ____________ Elizebeth Koller time: ____________ Date: ____________ Movements: ____________ Start time: ____________ Elizebeth Koller time: ____________ Date: ____________ Movements: ____________ Start time: ____________ Elizebeth Koller time: ____________ Date: ____________ Movements: ____________ Start time: ____________ Elizebeth Koller time: ____________ Date: ____________ Movements: ____________ Start time: ____________ Elizebeth Koller time: ____________ Date: ____________ Movements: ____________ Start time: ____________ Elizebeth Koller time: ____________  Date: ____________ Movements: ____________ Start time: ____________ Elizebeth Koller time: ____________ Date: ____________  Movements: ____________ Start time: ____________ Elizebeth Koller time: ____________ Date: ____________ Movements: ____________ Start time: ____________ Elizebeth Koller time: ____________ Date: ____________ Movements: ____________ Start time: ____________ Elizebeth Koller time: ____________ Date: ____________ Movements: ____________ Start time: ____________ Elizebeth Koller time: ____________ Date: ____________ Movements: ____________ Start time: ____________ Elizebeth Koller time: ____________ Date: ____________ Movements: ____________ Start time: ____________ Elizebeth Koller time: ____________  Date: ____________ Movements: ____________ Start time: ____________ Elizebeth Koller time: ____________ Date: ____________ Movements: ____________ Start time: ____________ Elizebeth Koller time: ____________ Date: ____________ Movements: ____________ Start time: ____________ Elizebeth Koller time: ____________ Date: ____________ Movements: ____________ Start time: ____________ Elizebeth Koller time: ____________ Date: ____________ Movements: ____________ Start time: ____________ Elizebeth Koller time: ____________ Date: ____________ Movements: ____________ Start time: ____________ Elizebeth Koller time: ____________   This information is not intended to replace advice given to you by your health care provider. Make sure you discuss any questions you have with your health care provider.   Document Released: 08/16/2006 Document Revised: 08/07/2014 Document Reviewed: 05/13/2012 Elsevier Interactive Patient Education 2016 [employer]. Vaginal Delivery During delivery, your health care provider will help you give birth to your baby. During a vaginal delivery, you will work to push the baby out of your vagina. However, before you can push your baby out, a few things need to happen. The opening of your uterus (cervix) has to soften, thin out, and open up (dilate) all the way to 10 cm. Also, your baby has to move down from the uterus into your vagina.  SIGNS OF LABOR  Your health care provider will first need to make sure you  are in labor. Signs of labor include:   Passing what is called the mucous plug before labor begins. This is a small amount of blood-stained mucus.  Having regular, painful uterine contractions.   The time between contractions gets shorter.   The discomfort and pain gradually get more intense.  Contraction pains get worse when walking and do not go away when resting.   Your cervix becomes thinner (effacement) and dilates. BEFORE THE DELIVERY Once you are in labor and admitted into the hospital or care center, your health care provider may do the following:  5. Perform a complete physical exam. 6. Review any complications related to pregnancy or labor. 7. Check your blood pressure, pulse, temperature, and heart rate (vital signs).  8. Determine if, and when, the rupture of amniotic membranes occurred. 9. Do a vaginal exam (using a sterile glove and lubricant) to determine:  1. The position (presentation) of the baby. Is the baby's head presenting first (vertex) in the birth canal (vagina), or are the feet or buttocks first (breech)?  2. The level (station) of the baby's head within  the birth canal.  3. The effacement and dilatation of the cervix.  10. An electronic fetal monitor is usually placed on your abdomen when you first arrive. This is used to monitor your contractions and the baby's heart rate. 1. When the monitor is on your abdomen (external fetal monitor), it can only pick up the frequency and length of your contractions. It cannot tell the strength of your contractions. 2. If it becomes necessary for your health care provider to know exactly how strong your contractions are or to see exactly what the baby's heart rate is doing, an internal monitor may be inserted into your vagina and uterus. Your health care provider will discuss the benefits and risks of using an internal monitor and obtain your permission before inserting the device. 3. Continuous fetal monitoring may be  needed if you have an epidural, are receiving certain medicines (such as oxytocin), or have pregnancy or labor complications. 11. An IV access tube may be placed into a vein in your arm to deliver fluids and medicines if necessary. THREE STAGES OF LABOR AND DELIVERY Normal labor and delivery is divided into three stages. First Stage This stage starts when you begin to contract regularly and your cervix begins to efface and dilate. It ends when your cervix is completely open (fully dilated). The first stage is the longest stage of labor and can last from 3 hours to 15 hours.  Several methods are available to help with labor pain. You and your health care provider will decide which option is best for you. Options include:   Opioid medicines. These are strong pain medicines that you can get through your IV tube or as a shot into your muscle. These medicines lessen pain but do not make it go away completely.  Epidural. A medicine is given through a thin tube that is inserted in your back. The medicine numbs the lower part of your body and prevents any pain in that area.  Paracervical pain medicine. This is an injection of an anesthetic on each side of your cervix.   You may request natural childbirth, which does not involve the use of pain medicines or an epidural during labor and delivery. Instead, you will use other things, such as breathing exercises, to help cope with the pain. Second Stage The second stage of labor begins when your cervix is fully dilated at 10 cm. It continues until you push your baby down through the birth canal and the baby is born. This stage can take only minutes or several hours.  The location of your baby's head as it moves through the birth canal is reported as a number called a station. If the baby's head has not started its descent, the station is described as being at minus 3 (-3). When your baby's head is at the zero station, it is at the middle of the birth canal  and is engaged in the pelvis. The station of your baby helps indicate the progress of the second stage of labor.  When your baby is born, your health care provider may hold the baby with his or her head lowered to prevent amniotic fluid, mucus, and blood from getting into the baby's lungs. The baby's mouth and nose may be suctioned with a small bulb syringe to remove any additional fluid.  Your health care provider may then place the baby on your stomach. It is important to keep the baby from getting cold. To do this, the health care provider will dry  the baby off, place the baby directly on your skin (with no blankets between you and the baby), and cover the baby with warm, dry blankets.   The umbilical cord is cut. Third Stage During the third stage of labor, your health care provider will deliver the placenta (afterbirth) and make sure your bleeding is under control. The delivery of the placenta usually takes about 5 minutes but can take up to 30 minutes. After the placenta is delivered, a medicine may be given either by IV or injection to help contract the uterus and control bleeding. If you are planning to breastfeed, you can try to do so now. After you deliver the placenta, your uterus should contract and get very firm. If your uterus does not remain firm, your health care provider will massage it. This is important because the contraction of the uterus helps cut off bleeding at the site where the placenta was attached to your uterus. If your uterus does not contract properly and stay firm, you may continue to bleed heavily. If there is a lot of bleeding, medicines may be given to contract the uterus and stop the bleeding.    This information is not intended to replace advice given to you by your health care provider. Make sure you discuss any questions you have with your health care provider.   Document Released: 04/25/2008 Document Revised: 08/07/2014 Document Reviewed: 03/13/2012 Elsevier  Interactive Patient Education [employer].

## 2015-10-16 NOTE — MAU Note (Signed)
Notified Dr. Gerarda Fraction of pt condition. Vs stable. EFM 135 with acclerations, intermittant ctxs, no bloody show or LOF. Orders for discharge with labor precautions.

## 2015-10-16 NOTE — MAU Note (Signed)
Ctxs q 10 min, diarrhea this AM, pressure

## 2015-10-17 ENCOUNTER — Encounter (HOSPITAL_COMMUNITY): Payer: Self-pay | Admitting: *Deleted

## 2015-10-17 ENCOUNTER — Inpatient Hospital Stay (HOSPITAL_COMMUNITY)
Admission: AD | Admit: 2015-10-17 | Discharge: 2015-10-17 | Disposition: A | Discharge status: Home / Self Care | Payer: 59 | Source: Ambulatory Visit | Attending: Obstetrics & Gynecology | Admitting: Obstetrics & Gynecology

## 2015-10-17 LAB — URINALYSIS, ROUTINE W REFLEX MICROSCOPIC
BILIRUBIN URINE: NEGATIVE
GLUCOSE, UA: NEGATIVE mg/dL
KETONES UR: NEGATIVE mg/dL
Leukocytes, UA: NEGATIVE
Nitrite: NEGATIVE
PH: 5.5 (ref 5.0–8.0)
PROTEIN: NEGATIVE mg/dL
Specific Gravity, Urine: 1.015 (ref 1.005–1.030)

## 2015-10-17 LAB — URINE MICROSCOPIC-ADD ON

## 2015-10-17 NOTE — MAU Note (Signed)
Pt reports pain in her hips, lower back and pelvis and a brownish discharge. States she is also having contractions but this does not feel like contractions.

## 2015-10-17 NOTE — MAU Provider Note (Signed)
History    Emily Morales is a 25 y/o female that is a G2P1001 at 38wk2d presenting for brown vaginal discharge and hip/pelvic pain.  CSN: [id number]  Arrival date and time: 10/17/15 0127   On 10/15/15 was seen for false labor.  Cervical exam was 2-3/thick /-3 at that time.    CC: brown vaginal discharge and hip/pelvic pain  HPI  Patient reports hip and pelvic pain that began on 3/17 that worsened recently. Was seen on 3/17 for this pain, discharged home for false labor. The pain is constant, and the patient rates the pain at 9/10. Pt describes the pain as "stabbing" and "stinging" pain in her hips and pelvis. The pain is worsened by straightening her legs or moving her legs into a butterfly position. The pain also radiates to the lower back. Pt has experienced this pain before during this pregnancy, but it has never been this severe before. Pt tried a single dose of 1000 mg acetaminophin, which did not help her pain. Pt denies CVA tenderness, burning with urination, or urinary urgency. Pt also denies diffuse or local abdominal pain apart from intermittent contractions and crampy pain.  Pt is also experiencing brown discharge from her vagina. The discharge began yesterday after she had vaginal sex. Pt reports no other LOF or vaginal bleeding. Current pregnancy is uncomplicated and pt is receiving routine prenatal care. Pt is experiencing irregular contractions prior to this visit.  Previous pregnancy was uncomplicated and she delivered by SVD at 39 weeks.    Past Medical History  Diagnosis Date  . Ovarian cyst, right   . Abnormal Pap smear   . Hx of chlamydia infection   . Headache(784.0)   . Uterine fibroid     Past Surgical History  Procedure Laterality Date  . Colposcopy    . Lipoma surgery      Family History  Problem Relation Age of Onset  . Diabetes Father   . Hypertension Father   . Cancer Maternal Grandmother     lung  . Heart disease Paternal Grandmother   .  Other Son     reactive airway disease  . Asthma Son   . Heart murmur Son     Social History  Substance Use Topics  . Smoking status: Never Smoker   . Smokeless tobacco: Never Used  . Alcohol Use: No     Comment: occ    Allergies:  Allergies  Allergen Reactions  . Pollen Extract Cough  . Latex Rash  . Other Rash    Coconut causes a rash    Prescriptions prior to admission  Medication Sig Dispense Refill Last Dose  . Prenatal Vit-Fe Fumarate-FA (MULTIVITAMIN-PRENATAL) 27-0.8 MG TABS tablet Take 1 tablet by mouth daily at 12 noon.   Taking    ROS  GI: some nausea reported, no vomiting. Diarrhea intermittently in the last week, has lessened recently. Labor: Pt reports some menstrual-like cramps and irregular contractions.  Physical Exam   Blood pressure 136/71, pulse 102, temperature 98.2 F (36.8 C), temperature source Oral, resp. rate 20, height 5\' 6"  (1.676 m), weight 220 lb (99.791 kg), last menstrual period 01/29/2015, SpO2 100 %.  Physical Exam Gen: well-appearing, no acute distress CV: RRR no m/r/g Ext: No swelling Abd: nontender, gravid abdomen Cervical Exam: unchanged from previous exam on 10/15/15- 2-3/thick/-3; vertex.  Recheck showed no change.    MAU Course  Procedures UA to r/o UTI Monitor on tocometry for 1 hour.    Assessment and Plan  Emily Morales is a 25 y/o female that is a G2P1001 at 38wk2d presenting for hip/pelvic pain.  #Labor Check The patient is not in active labor as there has not been cervical change from 10/15/15 and is not contracting regularly.  Continue with routine prenatal care.   -Fetal wellbeing: category 1 tracing.   -Patient was offered percocet and ambien for pain management.  Declined at this time #Hip/pelvic pain 2/2 physiologic pain of pregnancy.  Fetus is vertex. #Discharge related to bloody show; denies malodor and clear fluid leak.  Michael J Bolivia 10/17/2015, 2:32 AM   I have participated in the care of this  patient and I agree with the above. Serita Grammes CNM 8:51 AM 10/17/2015

## 2015-10-18 ENCOUNTER — Telehealth: Payer: Self-pay | Admitting: *Deleted

## 2015-10-18 ENCOUNTER — Encounter (HOSPITAL_COMMUNITY): Payer: Self-pay | Admitting: *Deleted

## 2015-10-18 ENCOUNTER — Inpatient Hospital Stay (HOSPITAL_COMMUNITY)
Admission: AD | Admit: 2015-10-18 | Discharge: 2015-10-20 | DRG: 775 | Disposition: A | Discharge status: Home / Self Care | Payer: 59 | Source: Ambulatory Visit | Attending: Obstetrics and Gynecology | Admitting: Obstetrics and Gynecology

## 2015-10-18 ENCOUNTER — Encounter: Payer: Self-pay | Admitting: Obstetrics and Gynecology

## 2015-10-18 ENCOUNTER — Inpatient Hospital Stay (HOSPITAL_COMMUNITY): Payer: 59 | Admitting: Anesthesiology

## 2015-10-18 ENCOUNTER — Ambulatory Visit (INDEPENDENT_AMBULATORY_CARE_PROVIDER_SITE_OTHER): Payer: 59 | Admitting: Obstetrics and Gynecology

## 2015-10-18 ENCOUNTER — Encounter: Payer: 59 | Admitting: Obstetrics & Gynecology

## 2015-10-18 VITALS — BP 120/76 | HR 116 | Wt 220.0 lb

## 2015-10-18 DIAGNOSIS — Z23 Encounter for immunization: Secondary | ICD-10-CM | POA: Diagnosis not present

## 2015-10-18 DIAGNOSIS — Z3483 Encounter for supervision of other normal pregnancy, third trimester: Secondary | ICD-10-CM | POA: Diagnosis not present

## 2015-10-18 DIAGNOSIS — Z3493 Encounter for supervision of normal pregnancy, unspecified, third trimester: Secondary | ICD-10-CM

## 2015-10-18 DIAGNOSIS — Z3A38 38 weeks gestation of pregnancy: Secondary | ICD-10-CM

## 2015-10-18 DIAGNOSIS — Z1389 Encounter for screening for other disorder: Secondary | ICD-10-CM

## 2015-10-18 DIAGNOSIS — Z331 Pregnant state, incidental: Secondary | ICD-10-CM

## 2015-10-18 LAB — POCT URINALYSIS DIPSTICK
GLUCOSE UA: NEGATIVE
Ketones, UA: NEGATIVE
Leukocytes, UA: NEGATIVE
NITRITE UA: NEGATIVE
Protein, UA: NEGATIVE
RBC UA: NEGATIVE

## 2015-10-18 LAB — TYPE AND SCREEN
ABO/RH(D): O POS
Antibody Screen: NEGATIVE

## 2015-10-18 LAB — CBC
HEMATOCRIT: 27.4 % — AB (ref 36.0–46.0)
Hemoglobin: 8.9 g/dL — ABNORMAL LOW (ref 12.0–15.0)
MCH: 25.6 pg — ABNORMAL LOW (ref 26.0–34.0)
MCHC: 32.5 g/dL (ref 30.0–36.0)
MCV: 79 fL (ref 78.0–100.0)
Platelets: 216 10*3/uL (ref 150–400)
RBC: 3.47 MIL/uL — ABNORMAL LOW (ref 3.87–5.11)
RDW: 15.2 % (ref 11.5–15.5)
WBC: 7.7 10*3/uL (ref 4.0–10.5)

## 2015-10-18 MED ORDER — EPHEDRINE 5 MG/ML INJ
10.0000 mg | INTRAVENOUS | Status: DC | PRN
  Filled 2015-10-18: qty 2

## 2015-10-18 MED ORDER — PHENYLEPHRINE 40 MCG/ML (10ML) SYRINGE FOR IV PUSH (FOR BLOOD PRESSURE SUPPORT)
80.0000 ug | PREFILLED_SYRINGE | INTRAVENOUS | Status: DC | PRN
  Filled 2015-10-18: qty 2

## 2015-10-18 MED ORDER — CITRIC ACID-SODIUM CITRATE 334-500 MG/5ML PO SOLN: 30.0000 mL | ORAL | Status: DC | PRN

## 2015-10-18 MED ORDER — LACTATED RINGERS IV SOLN
INTRAVENOUS | Status: DC
  Administered 2015-10-18: 20:00:00 via INTRAVENOUS

## 2015-10-18 MED ORDER — OXYCODONE-ACETAMINOPHEN 5-325 MG PO TABS: 2.0000 | ORAL_TABLET | ORAL | Status: DC | PRN

## 2015-10-18 MED ORDER — LIDOCAINE HCL (PF) 1 % IJ SOLN
INTRAMUSCULAR | Status: DC | PRN
  Administered 2015-10-18: 5 mL via EPIDURAL
  Administered 2015-10-18: 9 mL via EPIDURAL

## 2015-10-18 MED ORDER — PHENYLEPHRINE 40 MCG/ML (10ML) SYRINGE FOR IV PUSH (FOR BLOOD PRESSURE SUPPORT)
PREFILLED_SYRINGE | INTRAVENOUS | Status: AC
  Filled 2015-10-18: qty 20

## 2015-10-18 MED ORDER — ONDANSETRON HCL 4 MG/2ML IJ SOLN: 4.0000 mg | Freq: Four times a day (QID) | INTRAMUSCULAR | Status: DC | PRN

## 2015-10-18 MED ORDER — ACETAMINOPHEN 325 MG PO TABS
650.0000 mg | ORAL_TABLET | ORAL | Status: DC | PRN
Start: 2015-10-18 — End: 2015-10-19

## 2015-10-18 MED ORDER — OXYCODONE-ACETAMINOPHEN 5-325 MG PO TABS: 1.0000 | ORAL_TABLET | ORAL | Status: DC | PRN

## 2015-10-18 MED ORDER — DIPHENHYDRAMINE HCL 50 MG/ML IJ SOLN: 12.5000 mg | INTRAMUSCULAR | Status: DC | PRN

## 2015-10-18 MED ORDER — FENTANYL 2.5 MCG/ML BUPIVACAINE 1/10 % EPIDURAL INFUSION (WH - ANES)
14.0000 mL/h | INTRAMUSCULAR | Status: DC | PRN
  Administered 2015-10-18: 14 mL/h via EPIDURAL

## 2015-10-18 MED ORDER — LACTATED RINGERS IV SOLN: 500.0000 mL | Freq: Once | INTRAVENOUS | Status: DC

## 2015-10-18 MED ORDER — FLEET ENEMA 7-19 GM/118ML RE ENEM: 1.0000 | ENEMA | RECTAL | Status: DC | PRN

## 2015-10-18 MED ORDER — OXYTOCIN BOLUS FROM INFUSION
500.0000 mL | INTRAVENOUS | Status: DC
  Administered 2015-10-18: 500 mL via INTRAVENOUS

## 2015-10-18 MED ORDER — LACTATED RINGERS IV SOLN: 500.0000 mL | INTRAVENOUS | Status: DC | PRN

## 2015-10-18 MED ORDER — FENTANYL 2.5 MCG/ML BUPIVACAINE 1/10 % EPIDURAL INFUSION (WH - ANES)
INTRAMUSCULAR | Status: DC
Start: 2015-10-18 — End: 2015-10-19
  Filled 2015-10-18: qty 125

## 2015-10-18 MED ORDER — OXYTOCIN 10 UNIT/ML IJ SOLN
2.5000 [IU]/h | INTRAVENOUS | Status: DC
  Filled 2015-10-18: qty 10

## 2015-10-18 MED ORDER — LIDOCAINE HCL (PF) 1 % IJ SOLN
30.0000 mL | INTRAMUSCULAR | Status: DC | PRN
  Filled 2015-10-18: qty 30

## 2015-10-18 NOTE — Anesthesia Procedure Notes (Signed)
Epidural Patient location during procedure: OB Start time: 10/18/2015 9:20 PM End time: 10/18/2015 9:26 PM  Staffing Anesthesiologist: Lyn Hollingshead Performed by: anesthesiologist   Preanesthetic Checklist Completed: patient identified, surgical consent, pre-op evaluation, timeout performed, IV checked, risks and benefits discussed and monitors and equipment checked  Epidural Patient position: sitting Prep: site prepped and draped and DuraPrep Patient monitoring: continuous pulse ox and blood pressure Approach: midline Location: L3-L4 Injection technique: LOR air  Needle:  Needle type: Tuohy  Needle gauge: 17 G Needle length: 9 cm and 9 Needle insertion depth: 6 cm Catheter type: closed end flexible Catheter size: 19 Gauge Catheter at skin depth: 11 cm Test dose: negative and Other  Assessment Sensory level: T10 Events: blood not aspirated, injection not painful, no injection resistance, negative IV test and no paresthesia  Additional Notes Reason for block:procedure for pain

## 2015-10-18 NOTE — Anesthesia Preprocedure Evaluation (Signed)
Anesthesia Evaluation  Patient identified by MRN, date of birth, ID band Patient awake    Reviewed: Allergy & Precautions, H&P , NPO status , Patient's Chart, lab work & pertinent test results  Airway Mallampati: I  TM Distance: >3 FB Neck ROM: full    Dental no notable dental hx.    Pulmonary neg pulmonary ROS,    Pulmonary exam normal        Cardiovascular negative cardio ROS Normal cardiovascular exam     Neuro/Psych negative psych ROS   GI/Hepatic negative GI ROS, Neg liver ROS,   Endo/Other  negative endocrine ROS  Renal/GU negative Renal ROS     Musculoskeletal   Abdominal (+) + obese,   Peds  Hematology negative hematology ROS (+)   Anesthesia Other Findings   Reproductive/Obstetrics (+) Pregnancy                             Anesthesia Physical Anesthesia Plan  ASA: II  Anesthesia Plan: Epidural   Post-op Pain Management:    Induction:   Airway Management Planned:   Additional Equipment:   Intra-op Plan:   Post-operative Plan:   Informed Consent: I have reviewed the patients History and Physical, chart, labs and discussed the procedure including the risks, benefits and alternatives for the proposed anesthesia with the patient or authorized representative who has indicated his/her understanding and acceptance.     Plan Discussed with:   Anesthesia Plan Comments:         Anesthesia Quick Evaluation

## 2015-10-18 NOTE — H&P (Signed)
Emily Morales is a 25 y.o. female G2P1 @ 38.3 wks presenting for labor eval. States uc's started this morning.GBS neg. Maternal Medical History:  Reason for admission: Rupture of membranes and contractions.   Contractions: Onset was 13-24 hours ago.   Frequency: regular.    Fetal activity: Perceived fetal activity is normal.   Last perceived fetal movement was within the past hour.    Prenatal complications: no prenatal complications Prenatal Complications - Diabetes: none.    OB History    Gravida Para Term Preterm AB TAB SAB Ectopic Multiple Living   2 1 1  0 0 0 0 0 0 1     Past Medical History  Diagnosis Date  . Ovarian cyst, right   . Abnormal Pap smear   . Hx of chlamydia infection   . Headache(784.0)   . Uterine fibroid    Past Surgical History  Procedure Laterality Date  . Colposcopy    . Lipoma surgery     Family History: family history includes Asthma in her son; Cancer in her maternal grandmother; Diabetes in her father; Heart disease in her paternal grandmother; Heart murmur in her son; Hypertension in her father; Other in her son. Social History:  reports that she has never smoked. She has never used smokeless tobacco. She reports that she does not drink alcohol or use illicit drugs.   Prenatal Transfer Tool  Maternal Diabetes: No Genetic Screening: Normal Maternal Ultrasounds/Referrals: Normal Fetal Ultrasounds or other Referrals:  None Maternal Substance Abuse:  No Significant Maternal Medications:  None Significant Maternal Lab Results:  None Other Comments:  None  Review of Systems  Constitutional: Negative.   HENT: Negative.   Eyes: Negative.   Respiratory: Negative.   Cardiovascular: Negative.   Gastrointestinal: Positive for abdominal pain.  Genitourinary: Negative.   Musculoskeletal: Negative.   Skin: Negative.   Neurological: Negative.   Endo/Heme/Allergies: Negative.   Psychiatric/Behavioral: Negative.     Dilation:  3.5 Effacement (%): 70 Station: -2 Exam by:: Daiva Nakayama, CNM Blood pressure 128/80, pulse 106, temperature 98 F (36.7 C), temperature source Oral, resp. rate 18, last menstrual period 01/29/2015. Maternal Exam:  Uterine Assessment: Contraction strength is mild.  Contraction frequency is regular.   Abdomen: Patient reports no abdominal tenderness. Estimated fetal weight is 8 lbs.   Fetal presentation: vertex  Introitus: Normal vulva. Normal vagina.  Gross rom with exam  Pelvis: adequate for delivery.   Cervix: Cervix evaluated by digital exam.     Fetal Exam Fetal Monitor Review: Mode: ultrasound.   Variability: moderate (6-25 bpm).    Fetal State Assessment: Category I - tracings are normal.     Physical Exam  Constitutional: She is oriented to person, place, and time. She appears well-developed.  HENT:  Head: Normocephalic.  Eyes: Pupils are equal, round, and reactive to light.  Cardiovascular: Normal rate, regular rhythm, normal heart sounds and intact distal pulses.   Respiratory: Effort normal and breath sounds normal.  GI: Soft. Bowel sounds are normal.  Genitourinary: Vagina normal and uterus normal.  Musculoskeletal: Normal range of motion.  Neurological: She is alert and oriented to person, place, and time. She has normal reflexes.  Skin: Skin is warm and dry.  Psychiatric: She has a normal mood and affect. Her behavior is normal. Judgment and thought content normal.    Prenatal labs: ABO, Rh: O/Positive/-- (09/19 1000) Antibody: Negative (01/09 0902) Rubella: 24.40 (09/19 1000) RPR: Non Reactive (01/09 0902)  HBsAg: Negative (09/19 1000)  HIV: Non  Reactive (01/09 0902)  GBS: Negative (03/13 1200)   Assessment/Plan: SROM with exam. 4/70/-1. GBS neg. admit   Jahlon Baines DARLENE 10/18/2015, 6:50 PM

## 2015-10-18 NOTE — Progress Notes (Signed)
Pt worked in today for brownish discharge, pelvic pressure and contractions. Pt was seen at [facility] Friday.

## 2015-10-18 NOTE — Telephone Encounter (Signed)
Pt states seen at [facility] this weekend for brownish leakage and contractions 5-10 minutes apart. Pt states she has continued to have the leakage and contractions this am. Pt given an appt today for evaluation at 1:30 pm, however, pt informed if gush of fluids occurs, contraction persistent 5-10 minutes apart for 2 hours go to [facility]. Pt verbalized understanding.

## 2015-10-18 NOTE — MAU Note (Signed)
Pt. Contracting since [id number]. Here for evaluation. Baby moving well.

## 2015-10-19 ENCOUNTER — Encounter (HOSPITAL_COMMUNITY): Payer: Self-pay | Admitting: General Practice

## 2015-10-19 LAB — RPR: RPR: NONREACTIVE

## 2015-10-19 MED ORDER — TETANUS-DIPHTH-ACELL PERTUSSIS 5-2.5-18.5 LF-MCG/0.5 IM SUSP
0.5000 mL | Freq: Once | INTRAMUSCULAR | Status: AC
Start: 2015-10-19 — End: 2015-10-20
  Administered 2015-10-20: 0.5 mL via INTRAMUSCULAR

## 2015-10-19 MED ORDER — LANOLIN HYDROUS EX OINT: TOPICAL_OINTMENT | CUTANEOUS | Status: DC | PRN

## 2015-10-19 MED ORDER — PRENATAL MULTIVITAMIN CH
1.0000 | ORAL_TABLET | Freq: Every day | ORAL | Status: DC
  Administered 2015-10-19: 1 via ORAL
  Filled 2015-10-19: qty 1

## 2015-10-19 MED ORDER — SIMETHICONE 80 MG PO CHEW: 80.0000 mg | CHEWABLE_TABLET | ORAL | Status: DC | PRN

## 2015-10-19 MED ORDER — BENZOCAINE-MENTHOL 20-0.5 % EX AERO
1.0000 "application " | INHALATION_SPRAY | CUTANEOUS | Status: DC | PRN
  Administered 2015-10-19: 1 via TOPICAL
  Filled 2015-10-19: qty 56

## 2015-10-19 MED ORDER — SENNOSIDES-DOCUSATE SODIUM 8.6-50 MG PO TABS
2.0000 | ORAL_TABLET | ORAL | Status: DC
  Administered 2015-10-19 (×2): 2 via ORAL
  Filled 2015-10-19 (×2): qty 2

## 2015-10-19 MED ORDER — DIPHENHYDRAMINE HCL 25 MG PO CAPS: 25.0000 mg | ORAL_CAPSULE | Freq: Four times a day (QID) | ORAL | Status: DC | PRN

## 2015-10-19 MED ORDER — IBUPROFEN 600 MG PO TABS
600.0000 mg | ORAL_TABLET | Freq: Four times a day (QID) | ORAL | Status: DC
  Administered 2015-10-19 – 2015-10-20 (×5): 600 mg via ORAL
  Filled 2015-10-19 (×5): qty 1

## 2015-10-19 MED ORDER — ONDANSETRON HCL 4 MG PO TABS: 4.0000 mg | ORAL_TABLET | ORAL | Status: DC | PRN

## 2015-10-19 MED ORDER — OXYCODONE-ACETAMINOPHEN 5-325 MG PO TABS: 2.0000 | ORAL_TABLET | ORAL | Status: DC | PRN

## 2015-10-19 MED ORDER — ACETAMINOPHEN 325 MG PO TABS
650.0000 mg | ORAL_TABLET | ORAL | Status: DC | PRN
  Administered 2015-10-19: 650 mg via ORAL
  Filled 2015-10-19: qty 2

## 2015-10-19 MED ORDER — WITCH HAZEL-GLYCERIN EX PADS: 1.0000 "application " | MEDICATED_PAD | CUTANEOUS | Status: DC | PRN

## 2015-10-19 MED ORDER — DIBUCAINE 1 % RE OINT: 1.0000 "application " | TOPICAL_OINTMENT | RECTAL | Status: DC | PRN

## 2015-10-19 MED ORDER — ZOLPIDEM TARTRATE 5 MG PO TABS: 5.0000 mg | ORAL_TABLET | Freq: Every evening | ORAL | Status: DC | PRN

## 2015-10-19 MED ORDER — ONDANSETRON HCL 4 MG/2ML IJ SOLN: 4.0000 mg | INTRAMUSCULAR | Status: DC | PRN

## 2015-10-19 MED ORDER — OXYCODONE-ACETAMINOPHEN 5-325 MG PO TABS
1.0000 | ORAL_TABLET | ORAL | Status: DC | PRN
  Administered 2015-10-19 (×2): 1 via ORAL
  Filled 2015-10-19 (×2): qty 1

## 2015-10-19 NOTE — Lactation Note (Signed)
This note was copied from a baby's chart. Lactation Consultation Note  Patient Name: Emily Morales [id number] Date: 10/19/2015 Reason for consult: Other (Comment) (moms choice breast / fomula , baby had formula at 1255 , encourgaed to page with feeding cues )  Baby is 87 hours old and presently resting in front of mom on the bed sound asleep and not showing feeding cues.  LC reviewed basics of supply and demand and recommended calling on the nurses light with feeding cues for feeding assessment,  And when breast feeding offer both breast , if he is satisfied, hold off on supplementing, and if acting still hungry could re-latch or keep supplement low. Skin to skin feedings.  Mother informed of post-discharge support and given phone number to the lactation department, including services for phone call assistance; out-patient appointments; and breastfeeding support group. List of other breastfeeding resources in the community given in the handout. Encouraged mother to call for problems or concerns related to breastfeeding.   Maternal Data Does the patient have breastfeeding experience prior to this delivery?: Yes  Feeding Feeding Type: Breast Fed (start a feeding w baby skin to skin to warm up) Length of feed: 20 min  LATCH Score/Interventions                      Lactation Tools Discussed/Used     Consult Status Consult Status: Follow-up Date: 10/19/15 Follow-up type: In-patient    Myer Haff 10/19/2015, 2:55 PM

## 2015-10-19 NOTE — Progress Notes (Signed)
Post Partum Day 1 Subjective: no complaints, up ad lib, voiding and tolerating PO  Objective: Blood pressure 132/70, pulse 89, temperature 98.5 F (36.9 C), temperature source Oral, resp. rate 18, height 5\' 6"  (1.676 m), weight 220 lb (99.791 kg), last menstrual period 01/29/2015, SpO2 100 %.  Physical Exam:  General: alert, cooperative and no distress Lochia: appropriate Uterine Fundus: firm Incision: healing well DVT Evaluation: No evidence of DVT seen on physical exam.   Recent Labs  10/18/15 2006  HGB 8.9*  HCT 27.4*    Assessment/Plan: Plan for discharge tomorrow and Breastfeeding   LOS: 1 day   [facility] 10/19/2015, 6:09 AM

## 2015-10-19 NOTE — Anesthesia Postprocedure Evaluation (Signed)
Anesthesia Post Note  Patient: Emily Morales  Procedure(s) Performed: * No procedures listed *  Patient location during evaluation: Mother Baby Anesthesia Type: Epidural Level of consciousness: awake and alert Pain management: pain level controlled Vital Signs Assessment: post-procedure vital signs reviewed and stable Respiratory status: spontaneous breathing Cardiovascular status: blood pressure returned to baseline and stable Postop Assessment: no headache, no backache, patient able to bend at knees, no signs of nausea or vomiting and adequate PO intake Anesthetic complications: no    Last Vitals:  Filed Vitals:   10/19/15 0210 10/19/15 0600  BP: 132/70 131/80  Pulse: 89 99  Temp: 36.9 C 36.9 C  Resp: 18 18    Last Pain:  Filed Vitals:   10/19/15 0618  PainSc: 5                  Montey Ebel

## 2015-10-20 MED ORDER — SENNOSIDES-DOCUSATE SODIUM 8.6-50 MG PO TABS
2.0000 | ORAL_TABLET | ORAL | Status: DC | PRN
Start: 2015-10-20 — End: 2015-12-02

## 2015-10-20 MED ORDER — IBUPROFEN 600 MG PO TABS: 600.0000 mg | ORAL_TABLET | Freq: Four times a day (QID) | ORAL | Status: DC

## 2015-10-20 NOTE — Discharge Summary (Signed)
OB Discharge Summary     Patient Name: Emily Morales DOB: [birth year] MRN: [id number]  Date of admission: 10/18/2015 Delivering MD: Koren Shiver D   Date of discharge: 10/20/2015  Admitting diagnosis: 38w ctx Intrauterine pregnancy: [redacted]w[redacted]d     Secondary diagnosis:  Active Problems:   Normal labor   SVD (spontaneous vaginal delivery)  Additional problems: None     Discharge diagnosis: Term Pregnancy Delivered                                                                                                Post partum procedures:None  Augmentation: None  Complications: None  Hospital course:  Onset of Labor With Vaginal Delivery     25 y.o. yo [id number] at [redacted]w[redacted]d was admitted in Active Labor on 10/18/2015. Patient had an uncomplicated labor course as follows:  Membrane Rupture Time/Date: 6:46 PM ,10/18/2015   Intrapartum Procedures: Episiotomy: None [1]                                         Lacerations:  None [1]  Patient had a delivery of a Viable infant. 10/18/2015  Information for the patient's newborn:  Desyre, [place] [id number]  Delivery Method: Vaginal, Spontaneous Delivery (Filed from Delivery Summary)    Pateint had an uncomplicated postpartum course.  She is ambulating, tolerating a regular diet, passing flatus, and urinating well. Patient is discharged home in stable condition on 10/20/2015.  Physical exam  Filed Vitals:   10/19/15 0600 10/19/15 1345 10/19/15 1859 10/20/15 0640  BP: 131/80 121/82 113/74 120/68  Pulse: 99 82 84 85  Temp: 98.5 F (36.9 C) 98.3 F (36.8 C) 97.9 F (36.6 C) 98.2 F (36.8 C)  TempSrc: Oral Oral Oral   Resp: 18 18 18 18   Height:      Weight:      SpO2: 100% 100%     General: alert, cooperative and no distress Lochia: appropriate Uterine Fundus: firm Incision: N/A DVT Evaluation: No cords or calf tenderness.  Labs: Lab Results  Component Value Date   WBC 7.7 10/18/2015   HGB 8.9* 10/18/2015   HCT 27.4*  10/18/2015   MCV 79.0 10/18/2015   PLT 216 10/18/2015   CMP Latest Ref Rng 08/17/2015  Glucose 65 - 99 mg/dL 105(H)  BUN 6 - 20 mg/dL 6  Creatinine 0.44 - 1.00 mg/dL 0.38(L)  Sodium 135 - 145 mmol/L 136  Potassium 3.5 - 5.1 mmol/L 3.0(L)  Chloride 101 - 111 mmol/L 104  CO2 22 - 32 mmol/L 23  Calcium 8.9 - 10.3 mg/dL 8.9  Total Protein 6.5 - 8.1 g/dL 6.9  Total Bilirubin 0.3 - 1.2 mg/dL 0.6  Alkaline Phos 38 - 126 U/L 91  AST 15 - 41 U/L 18  ALT 14 - 54 U/L 12(L)    Discharge instruction: per After Visit Summary and "Baby and Me Booklet".  After visit meds:    Medication List    TAKE these medications  ibuprofen 600 MG tablet  Commonly known as:  ADVIL,MOTRIN  Take 1 tablet (600 mg total) by mouth every 6 (six) hours.     multivitamin-prenatal 27-0.8 MG Tabs tablet  Take 1 tablet by mouth daily at 12 noon.     senna-docusate 8.6-50 MG tablet  Commonly known as:  Senokot-S  Take 2 tablets by mouth as needed for mild constipation or moderate constipation.        Diet: routine diet  Activity: Advance as tolerated. Pelvic rest for 6 weeks.   Outpatient follow up:6 weeks Follow up Appt:No future appointments. Follow up Visit:No Follow-up on file.  Postpartum contraception: Nexplanon at [facility]  Newborn Data: Live born female  Birth Weight: 7 lb 13.8 oz (3566 g) APGAR: 9, 9  Baby Feeding: Breast Disposition:home with mother   Luiz Blare, DO 10/20/2015, 7:06 AM PGY-2, [place]  I have seen this patient and agree with the above resident's note.  [place]KIRBY, [place] Certified Nurse-Midwife

## 2015-10-20 NOTE — Lactation Note (Signed)
This note was copied from a baby's chart. Lactation Consultation Note  Patient Name: Emily Morales [id number] Date: 10/20/2015   Visited with Mom on day of discharge, baby 47 hrs old and being fed primarily bottles of formula.  Talked about importance of exclusive breastfeeding, feeding skin to skin, and often on cue.  Engorgement prevention and treatment discussed.  Reminded Mom she can call us for any questions, and told her of OP lactation resources available to her.    Broadus John 10/20/2015, 11:31 AM

## 2015-10-20 NOTE — Discharge Instructions (Signed)

## 2015-10-22 ENCOUNTER — Encounter: Payer: 59 | Admitting: Obstetrics & Gynecology

## 2015-11-01 ENCOUNTER — Encounter (HOSPITAL_COMMUNITY): Payer: Self-pay | Admitting: *Deleted

## 2015-11-02 ENCOUNTER — Telehealth: Payer: Self-pay | Admitting: *Deleted

## 2015-11-03 NOTE — Telephone Encounter (Signed)
Pt informed FMLA completed.

## 2015-11-17 ENCOUNTER — Ambulatory Visit (INDEPENDENT_AMBULATORY_CARE_PROVIDER_SITE_OTHER): Payer: 59 | Admitting: Women's Health

## 2015-11-17 ENCOUNTER — Encounter: Payer: Self-pay | Admitting: Women's Health

## 2015-11-17 DIAGNOSIS — F53 Postpartum depression: Secondary | ICD-10-CM

## 2015-11-17 DIAGNOSIS — F329 Major depressive disorder, single episode, unspecified: Secondary | ICD-10-CM | POA: Insufficient documentation

## 2015-11-17 DIAGNOSIS — F32A Depression, unspecified: Secondary | ICD-10-CM | POA: Insufficient documentation

## 2015-11-17 DIAGNOSIS — F419 Anxiety disorder, unspecified: Secondary | ICD-10-CM

## 2015-11-17 DIAGNOSIS — O99345 Other mental disorders complicating the puerperium: Secondary | ICD-10-CM

## 2015-11-17 NOTE — Patient Instructions (Signed)
NO SEX UNTIL AFTER YOU GET YOUR BIRTH CONTROL   Levonorgestrel intrauterine device (IUD) What is this medicine? LEVONORGESTREL IUD (LEE voe nor jes trel) is a contraceptive (birth control) device. The device is placed inside the uterus by a healthcare professional. It is used to prevent pregnancy and can also be used to treat heavy bleeding that occurs during your period. Depending on the device, it can be used for 3 to 5 years. This medicine may be used for other purposes; ask your health care provider or pharmacist if you have questions. What should I tell my health care provider before I take this medicine? They need to know if you have any of these conditions: -abnormal Pap smear -cancer of the breast, uterus, or cervix -diabetes -endometritis -genital or pelvic infection now or in the past -have more than one sexual partner or your partner has more than one partner -heart disease -history of an ectopic or tubal pregnancy -immune system problems -IUD in place -liver disease or tumor -problems with blood clots or take blood-thinners -use intravenous drugs -uterus of unusual shape -vaginal bleeding that has not been explained -an unusual or allergic reaction to levonorgestrel, other hormones, silicone, or polyethylene, medicines, foods, dyes, or preservatives -pregnant or trying to get pregnant -breast-feeding How should I use this medicine? This device is placed inside the uterus by a health care professional. Talk to your pediatrician regarding the use of this medicine in children. Special care may be needed. Overdosage: If you think you have taken too much of this medicine contact a poison control center or emergency room at once. NOTE: This medicine is only for you. Do not share this medicine with others. What if I miss a dose? This does not apply. What may interact with this medicine? Do not take this medicine with any of the following  medications: -amprenavir -bosentan -fosamprenavir This medicine may also interact with the following medications: -aprepitant -barbiturate medicines for inducing sleep or treating seizures -bexarotene -griseofulvin -medicines to treat seizures like carbamazepine, ethotoin, felbamate, oxcarbazepine, phenytoin, topiramate -modafinil -pioglitazone -rifabutin -rifampin -rifapentine -some medicines to treat HIV infection like atazanavir, indinavir, lopinavir, nelfinavir, tipranavir, ritonavir -St. John's wort -warfarin This list may not describe all possible interactions. Give your health care provider a list of all the medicines, herbs, non-prescription drugs, or dietary supplements you use. Also tell them if you smoke, drink alcohol, or use illegal drugs. Some items may interact with your medicine. What should I watch for while using this medicine? Visit your doctor or health care professional for regular check ups. See your doctor if you or your partner has sexual contact with others, becomes HIV positive, or gets a sexual transmitted disease. This product does not protect you against HIV infection (AIDS) or other sexually transmitted diseases. You can check the placement of the IUD yourself by reaching up to the top of your vagina with clean fingers to feel the threads. Do not pull on the threads. It is a good habit to check placement after each menstrual period. Call your doctor right away if you feel more of the IUD than just the threads or if you cannot feel the threads at all. The IUD may come out by itself. You may become pregnant if the device comes out. If you notice that the IUD has come out use a backup birth control method like condoms and call your health care provider. Using tampons will not change the position of the IUD and are okay to use during your   period. What side effects may I notice from receiving this medicine? Side effects that you should report to your doctor or  health care professional as soon as possible: -allergic reactions like skin rash, itching or hives, swelling of the face, lips, or tongue -fever, flu-like symptoms -genital sores -high blood pressure -no menstrual period for 6 weeks during use -pain, swelling, warmth in the leg -pelvic pain or tenderness -severe or sudden headache -signs of pregnancy -stomach cramping -sudden shortness of breath -trouble with balance, talking, or walking -unusual vaginal bleeding, discharge -yellowing of the eyes or skin Side effects that usually do not require medical attention (report to your doctor or health care professional if they continue or are bothersome): -acne -breast pain -change in sex drive or performance -changes in weight -cramping, dizziness, or faintness while the device is being inserted -headache -irregular menstrual bleeding within first 3 to 6 months of use -nausea This list may not describe all possible side effects. Call your doctor for medical advice about side effects. You may report side effects to FDA at 1-800-FDA-1088. Where should I keep my medicine? This does not apply. NOTE: This sheet is a summary. It may not cover all possible information. If you have questions about this medicine, talk to your doctor, pharmacist, or health care provider.    2016, Elsevier/Gold Standard. (2011-08-17 13:54:04)  

## 2015-11-17 NOTE — Progress Notes (Signed)
Patient ID: Emily Morales, female   DOB: September 01, 1990, 25 y.o.   MRN: [id number] Subjective:    Emily Morales is a 25 y.o. [id number] African American female who presents for a postpartum visit. She is 4 weeks postpartum following a spontaneous vaginal delivery at 38.3 gestational weeks. Anesthesia: epidural. I have fully reviewed the prenatal and intrapartum course. Postpartum course has been uncomplicated. Baby's course has been uncomplicated. Baby is feeding by breast x 3.5wks, now bottle- wasn't able to keep up w/ baby's demand- doesn't want tips to increase supply . Bleeding thin lochia. Bowel function is normal. Bladder function is normal. Patient is not sexually active. Last sexual activity: prior to birth of baby. Contraception method is wants IUD. Postpartum depression screening: negative. Score 9.  Feels like she was depressed after baby born, just overwhelmed, husband works 3rd shift so she doesn't have any help w/ baby. Was taking celexa during pregnancy- states she is trying not to take now, has had to take 1 time. Denies SI/HI/II.  Declines counseling.  Last pap 04/19/15 and was LSIL/HPV w/ normal colpo- to repeat colpo 6-8wks pp.  The following portions of the patient's history were reviewed and updated as appropriate: allergies, current medications, past medical history, past surgical history and problem list.  Review of Systems Pertinent items are noted in HPI.   Filed Vitals:   11/17/15 1341  BP: 130/76  Pulse: 60  Weight: 193 lb (87.544 kg)   No LMP recorded.  Objective:   General:  alert, cooperative and no distress   Breasts:  deferred, no complaints  Lungs: clear to auscultation bilaterally  Heart:  regular rate and rhythm  Abdomen: soft, nontender   Vulva: normal  Vagina: normal vagina  Cervix:  closed  Corpus: Well-involuted  Adnexa:  Non-palpable  Rectal Exam: No hemorrhoids        Assessment:   Postpartum exam 4 wks s/p SVB Bottlefeeding Depression  screening Mild PPD Contraception counseling  H/o abnormal pap w/ normal colpo during pregnancy  Plan:  Take celexa if needed, let us know if decides for counseling Contraception: abstinence until IUD insertion Follow up in: 2 weeks for colpo then as long as normal will get scheduled for IUD insertion, or earlier if needed  Tawnya Crook CNM, [facility] 11/17/2015 1:54 PM

## 2015-11-22 NOTE — Therapy (Signed)
[place], [place], [address] Phone: [phone]   Fax:  [phone]  Patient Details  Name: Emily Morales MRN: [id number] Date of Birth: 02-Feb-1991 Referring Provider:  Carole Civil, MD  Encounter Date: 11/22/2015  PHYSICAL THERAPY DISCHARGE SUMMARY  Visits from Start of Care: 3  Current functional level related to goals / functional outcomes: Patient has not returned since last skilled session    Remaining deficits: Unable to assess    Education / Equipment: None  Plan: Patient agrees to discharge.  Patient goals were not met. Patient is being discharged due to not returning since the last visit.  ?????       Emily Ree PT, DPT [facility] [address] [place], [place], [address] Phone: [phone]   Fax:  [phone]

## 2015-12-02 ENCOUNTER — Other Ambulatory Visit: Payer: Self-pay | Admitting: Obstetrics and Gynecology

## 2015-12-02 ENCOUNTER — Ambulatory Visit (INDEPENDENT_AMBULATORY_CARE_PROVIDER_SITE_OTHER): Payer: 59 | Admitting: Obstetrics and Gynecology

## 2015-12-02 ENCOUNTER — Encounter: Payer: Self-pay | Admitting: Obstetrics and Gynecology

## 2015-12-02 VITALS — BP 120/60 | HR 58 | Ht 66.0 in | Wt 195.5 lb

## 2015-12-02 DIAGNOSIS — Z3202 Encounter for pregnancy test, result negative: Secondary | ICD-10-CM

## 2015-12-02 DIAGNOSIS — N72 Inflammatory disease of cervix uteri: Secondary | ICD-10-CM | POA: Diagnosis not present

## 2015-12-02 DIAGNOSIS — N870 Mild cervical dysplasia: Secondary | ICD-10-CM

## 2015-12-02 LAB — POCT URINE PREGNANCY: PREG TEST UR: NEGATIVE

## 2015-12-02 NOTE — Addendum Note (Signed)
Addended by: Linton Rump on: 12/02/2015 05:01 PM   Modules accepted: Orders

## 2015-12-02 NOTE — Progress Notes (Signed)
Patient ID: KHRYSTEN GARFINKLE, female   DOB: [birth year], 25 y.o.   MRN: [id number]  MAYIA CLAYMAN 25 y.o. [id number] here for colposcopy for low-grade squamous intraepithelial neoplasia (LGSIL - encompassing HPV,mild dysplasia,CIN I) pap smear on 04/2015 . And also previously 2015  Discussed role for HPV in cervical dysplasia, need for surveillance.  Patient given informed consent, signed copy in the chart, time out was performed.  Placed in lithotomy position. Cervix viewed with speculum and colposcope after application of acetic acid.   Colposcopy adequate? Yes  There is this small area of punctation at 1:00. The cervix is otherwise well visualized; biopsies obtained at 1:00.   ECC specimen obtained. Not done  adequate visualization of the everted multiparous cervix All specimens were labelled and sent to pathology.  Colposcopy IMPRESSION: CIN-1, minimal at 1:00  Patient was given post procedure instructions. Will follow up pathology and manage accordingly.  Routine preventative health maintenance measures emphasized.   Plan is to follow up pathology at the  time of IUD placement. Next week Patient has [place].

## 2015-12-10 ENCOUNTER — Encounter: Payer: Self-pay | Admitting: Obstetrics and Gynecology

## 2015-12-10 ENCOUNTER — Ambulatory Visit (INDEPENDENT_AMBULATORY_CARE_PROVIDER_SITE_OTHER): Payer: 59 | Admitting: Obstetrics and Gynecology

## 2015-12-10 VITALS — BP 122/92 | HR 62 | Wt 190.4 lb

## 2015-12-10 DIAGNOSIS — Z30014 Encounter for initial prescription of intrauterine contraceptive device: Secondary | ICD-10-CM

## 2015-12-10 DIAGNOSIS — Z30430 Encounter for insertion of intrauterine contraceptive device: Secondary | ICD-10-CM

## 2015-12-10 DIAGNOSIS — Z3202 Encounter for pregnancy test, result negative: Secondary | ICD-10-CM | POA: Diagnosis not present

## 2015-12-10 LAB — POCT URINE PREGNANCY: PREG TEST UR: NEGATIVE

## 2015-12-10 NOTE — Progress Notes (Signed)
Patient ID: Emily Morales, female   DOB: Apr 12, 1991, 25 y.o.   MRN: [id number]  [facility] CLINIC PROCEDURE NOTE- IUD INSERT  PRIYANA TIENDA is a 25 y.o. [phone] here for Lile[place]ta([id number])  IUD insertion  No GYN concerns.   Last pap smear was on 04/2015.  and showed LGSIL - encompassing HPV,mild dysplasia,CIN  A biopsy showed granulation tissue but no dysplasia. GC/CHL negative on 10/11/15 patient informed of results.  IUD Insertion Procedure Note Patient identified, informed consent performed, consent signed.   Discussed risks of irregular bleeding, cramping, infection, malpositioning or misplacement of the IUD outside the uterus which may require further procedure such as laparoscopy. Time out was performed.  Urine pregnancy test negative.  Speculum inserted, Cervix visualized.  Cleaned with Betadine x 2.   Grasped anteriorly with a single tooth tenaculum.  Uterus sounded to 10 cm. anteverted Mirena IUD placed per manufacturer's recommendations.   Strings trimmed to 3 cm.  Tenaculum was removed, good hemostasis noted.  Patient tolerated procedure well. Minimal discomfort.  Patient was given post-procedure instructions.  She was advised to have backup contraception for one week.  Patient was also asked to check IUD strings periodically and follow up in 4 weeks for IUD check.  By signing my name below, I, Stephania Fragmin, attest that this documentation has been prepared under the direction and in the presence of Jonnie Kind, MD. Electronically Signed: Stephania Fragmin, ED Scribe. 12/10/2015. 12:35 PM.  I personally performed the services described in this documentation, which was SCRIBED in my presence. The recorded information has been reviewed and considered accurate. It has been edited as necessary during review. Jonnie Kind, MD

## 2015-12-18 ENCOUNTER — Telehealth: Payer: 59 | Admitting: Nurse Practitioner

## 2015-12-18 DIAGNOSIS — J301 Allergic rhinitis due to pollen: Secondary | ICD-10-CM

## 2015-12-18 MED ORDER — FLUTICASONE PROPIONATE 50 MCG/ACT NA SUSP: 2.0000 | Freq: Every day | NASAL | Status: DC

## 2015-12-18 NOTE — Progress Notes (Signed)
E visit for Allergic Rhinitis We are sorry that you are not feeling well.  Her is how we plan to help!  Based on what you have shared with me it looks like you have Allergic Rhinitis.  Rhinitis is when a reaction occurs that causes nasal congestion, runny nose, sneezing, and itching.  Most types of rhinitis are caused by an inflammation and are associated with symptoms in the eyes ears or throat. There are several types of rhinitis.  The most common are acute rhinitis, which is usually caused by a viral illness, allergic or seasonal rhinitis, and nonallergic or year-round rhinitis.  Nasal allergies occur certain times of the year.  Allergic rhinitis is caused when allergens in the air trigger the release of histamine in the body.  Histamine causes itching, swelling, and fluid to build up in the fragile linings of the nasal passages, sinuses and eyelids.  An itchy nose and clear discharge are common.  I recommend the following over the counter treatments: You should take a daily dose of antihistamine- zytrec is fine to continue  I also would recommend a nasal spray: Flonase 2 sprays into each nostril once daily  You may also benefit from eye drops such as: Visine 1-2 drops each eye twice daily as needed  HOME CARE:   You can use an over-the-counter saline nasal spray as needed  Avoid areas where there is heavy dust, mites, or molds  Stay indoors on windy days during the pollen season  Keep windows closed in home, at least in bedroom; use air conditioner.  Use high-efficiency house air filter  Keep windows closed in car, turn AC on re-circulate  Avoid playing out with dog during pollen season  GET HELP RIGHT AWAY IF:   If your symptoms do not improve within 10 days  You become short of breath  You develop yellow or green discharge from your nose for over 3 days  You have coughing fits  MAKE SURE YOU:   Understand these instructions  Will watch your condition  Will get  help right away if you are not doing well or get worse  Thank you for choosing an e-visit. Your e-visit answers were reviewed by a board certified advanced clinical practitioner to complete your personal care plan. Depending upon the condition, your plan could have included both over the counter or prescription medications. Please review your pharmacy choice. Be sure that the pharmacy you have chosen is open so that you can pick up your prescription now.  If there is a problem you may message your provider in [place] to have the prescription routed to another pharmacy. Your safety is important to [place]. If you have drug allergies check your prescription carefully.  For the next 24 hours, you can use MyChart to ask questions about today's visit, request a non-urgent call back, or ask for a work or school excuse from your e-visit provider. You will get an email in the next two days asking about your experience. I hope that your e-visit has been valuable and will speed your recovery.

## 2015-12-22 ENCOUNTER — Telehealth: Payer: 59 | Admitting: Family

## 2015-12-22 NOTE — Progress Notes (Signed)
Based on what you shared with me it looks like you have a serious condition that should be evaluated in a face to face office visit.  If you are having a true medical emergency please call 911.  If you need an urgent face to face visit, [facility] has four urgent care centers for your convenience.  . [facility]  [phone] Get Driving Directions Find a Provider at this Location  [address] [place], [place] [address] . 8 am to 8 pm Monday-Friday . 9 am to 7 pm Saturday-Sunday  . [facility]  [phone] Get Driving Directions Find a Provider at this Location  [address], [address] [place], [place] [address] . 8 am to 8 pm Monday-Friday . 9 am to 6 pm Saturday . 11 am to 6 pm Sunday   .  [facility]  [phone] Get Driving Directions  [address] [place], [place] [address] . 8 am to 8 pm Monday-Friday . 9 am to 4 pm Saturday-Sunday   . [facility] (a walk in primary care provider)  [phone]  Get Driving Directions Find a Provider at this Location  [address] [place], [place] [address] . 8 am to 8:30 pm Monday-Thursday . 8 am to 6 pm Friday . 8 am to 4 pm Saturday-Sunday   Your e-visit answers were reviewed by a board certified advanced clinical practitioner to complete your personal care plan.  Thank you for using e-Visits. 

## 2016-01-03 ENCOUNTER — Emergency Department (HOSPITAL_COMMUNITY): Payer: 59

## 2016-01-03 ENCOUNTER — Encounter (HOSPITAL_COMMUNITY): Payer: Self-pay | Admitting: Emergency Medicine

## 2016-01-03 ENCOUNTER — Emergency Department (HOSPITAL_COMMUNITY)
Admission: EM | Admit: 2016-01-03 | Discharge: 2016-01-03 | Disposition: A | Discharge status: Home / Self Care | Payer: 59 | Attending: Emergency Medicine | Admitting: Emergency Medicine

## 2016-01-03 DIAGNOSIS — R079 Chest pain, unspecified: Secondary | ICD-10-CM | POA: Diagnosis not present

## 2016-01-03 DIAGNOSIS — Z791 Long term (current) use of non-steroidal anti-inflammatories (NSAID): Secondary | ICD-10-CM | POA: Insufficient documentation

## 2016-01-03 DIAGNOSIS — R0789 Other chest pain: Secondary | ICD-10-CM | POA: Insufficient documentation

## 2016-01-03 LAB — BASIC METABOLIC PANEL
Anion gap: 5 (ref 5–15)
BUN: 10 mg/dL (ref 6–20)
CHLORIDE: 106 mmol/L (ref 101–111)
CO2: 26 mmol/L (ref 22–32)
CREATININE: 0.56 mg/dL (ref 0.44–1.00)
Calcium: 8.7 mg/dL — ABNORMAL LOW (ref 8.9–10.3)
GFR calc non Af Amer: >60 mL/min (ref >60)
GLUCOSE: 88 mg/dL (ref 65–99)
Potassium: 3.5 mmol/L (ref 3.5–5.1)
Sodium: 137 mmol/L (ref 135–145)

## 2016-01-03 LAB — CBC
HCT: 28.5 % — ABNORMAL LOW (ref 36.0–46.0)
Hemoglobin: 9.2 g/dL — ABNORMAL LOW (ref 12.0–15.0)
MCH: 25 pg — AB (ref 26.0–34.0)
MCHC: 32.3 g/dL (ref 30.0–36.0)
MCV: 77.4 fL — AB (ref 78.0–100.0)
PLATELETS: 224 10*3/uL (ref 150–400)
RBC: 3.68 MIL/uL — AB (ref 3.87–5.11)
RDW: 17.2 % — ABNORMAL HIGH (ref 11.5–15.5)
WBC: 2.8 10*3/uL — ABNORMAL LOW (ref 4.0–10.5)

## 2016-01-03 LAB — D-DIMER, QUANTITATIVE: D-Dimer, Quant: 0.29 ug/mL-FEU (ref 0.00–0.50)

## 2016-01-03 LAB — TROPONIN I: Troponin I: <0.03 ng/mL (ref <0.031)

## 2016-01-03 MED ORDER — ALBUTEROL SULFATE HFA 108 (90 BASE) MCG/ACT IN AERS
1.0000 | INHALATION_SPRAY | RESPIRATORY_TRACT | Status: DC | PRN
  Administered 2016-01-03: 1 via RESPIRATORY_TRACT
  Filled 2016-01-03: qty 6.7

## 2016-01-03 NOTE — ED Notes (Signed)
Pt c/o intermittent cp radiating to back x 3 weeks. Pt reports pain worse last night and this am. Pt also states pain radiates throughout torso at times. Pt is 2 months postpartum.

## 2016-01-03 NOTE — ED Notes (Signed)
Respiratory Therapist of need for Albuterol inhaler.

## 2016-01-03 NOTE — Discharge Instructions (Signed)

## 2016-01-03 NOTE — ED Provider Notes (Addendum)
CSN: [id number]     Arrival date & time 01/03/16  0708 History   By signing my name below, I, Nicole Kindred, attest that this documentation has been prepared under the direction and in the presence of Blanchie Dessert, MD.   Electronically Signed: Nicole Kindred, ED Scribe. 01/03/2016. 8:02 AM    Chief Complaint  Patient presents with  . Chest Pain    The history is provided by the patient. No language interpreter was used.   HPI Comments: Emily Morales is a 25 y.o. female with PMHx of asthma who presents to the Emergency Department complaining of gradual onset, intermittent, sharp, chest pain, ongoing for three weeks. Pt reports her pain was worse than usual last night and this morning prompting her to come to the ED. Pt reports associated mild cough. No other associated symptoms noted. Pt reports her chest pain is worse with movement and deep breathes. The pain is sometimes worsened with eating. Her chest pain seems to be worse when she is standing and active. Pt took ibuprofen PTA with no relief to symptoms. No other worsening or alleviating factors noted. Pt denies shortness of breath, leg swelling, wheezing, nausea, emesis, breast tenderness, or any other pertinent symptoms. Pt is two months postpartum.   Past Medical History  Diagnosis Date  . Ovarian cyst, right   . Abnormal Pap smear   . Hx of chlamydia infection   . Headache(784.0)   . Uterine fibroid    Past Surgical History  Procedure Laterality Date  . Colposcopy    . Lipoma surgery     Family History  Problem Relation Age of Onset  . Diabetes Father   . Hypertension Father   . Cancer Maternal Grandmother     lung  . Heart disease Paternal Grandmother   . Other Son     reactive airway disease  . Asthma Son   . Heart murmur Son    Social History  Substance Use Topics  . Smoking status: Never Smoker   . Smokeless tobacco: Never Used  . Alcohol Use: Yes     Comment: occ   OB History    Gravida  Para Term Preterm AB TAB SAB Ectopic Multiple Living   2 2 2  0 0 0 0 0 0 2     Review of Systems A complete 10 system review of systems was obtained and all systems are negative except as noted in the HPI and PMH.    Allergies  Pollen extract; Latex; and Other  Home Medications   Prior to Admission medications   Medication Sig Start Date End Date Taking? Authorizing Provider  fluticasone (FLONASE) 50 MCG/ACT nasal spray Place 2 sprays into both nostrils daily. 12/18/15   Mary-Margaret Hassell Done, FNP  ibuprofen (ADVIL,MOTRIN) 600 MG tablet Take 1 tablet (600 mg total) by mouth every 6 (six) hours. Patient taking differently: Take 600 mg by mouth as needed.  10/20/15   Jazma Sharyne Richters, DO   BP 144/92 mmHg  Pulse 60  Temp(Src) 98 F (36.7 C)  Resp 18  Ht 5\' 6"  (1.676 m)  Wt 190 lb (86.183 kg)  BMI 30.68 kg/m2  SpO2 100%  LMP 12/05/2015 Physical Exam  Constitutional: She is oriented to person, place, and time. She appears well-developed and well-nourished. No distress.  HENT:  Head: Normocephalic and atraumatic.  Eyes: EOM are normal.  Neck: Normal range of motion.  Cardiovascular: Normal rate, regular rhythm and normal heart sounds.   Pulmonary/Chest: Effort normal and breath  sounds normal. No respiratory distress. She has no wheezes. She has no rales. She exhibits no tenderness.  Abdominal: Soft. She exhibits no distension. There is no tenderness.  Musculoskeletal: Normal range of motion.  Neurological: She is alert and oriented to person, place, and time.  Skin: Skin is warm and dry.  Psychiatric: She has a normal mood and affect. Judgment normal.  Nursing note and vitals reviewed.  DIAGNOSTIC STUDIES: Oxygen Saturation is 100% on RA, normal by my interpretation.    COORDINATION OF CARE: 8:08 AM Discussed treatment plan with pt at bedside and pt agreed to plan.   ED Course  Procedures (including critical care time) Labs Review Labs Reviewed  BASIC METABOLIC PANEL -  Abnormal; Notable for the following:    Calcium 8.7 (*)    All other components within normal limits  CBC - Abnormal; Notable for the following:    WBC 2.8 (*)    RBC 3.68 (*)    Hemoglobin 9.2 (*)    HCT 28.5 (*)    MCV 77.4 (*)    MCH 25.0 (*)    RDW 17.2 (*)    All other components within normal limits  TROPONIN I  D-DIMER, QUANTITATIVE (NOT AT [facility])    Imaging Review Dg Chest 2 View  01/03/2016  CLINICAL DATA:  Intermittent chest pain radiating into the back for 3 weeks, worsened last night and this morning. No known injury. Initial encounter. EXAM: CHEST  2 VIEW COMPARISON:  PA and lateral chest 07/01/2014. FINDINGS: The lungs are clear. Heart size is normal. No pneumothorax or pleural effusion. No bony abnormality is identified. IMPRESSION: Normal chest. Electronically Signed   By: Inge Rise M.D.   On: 01/03/2016 08:27   I have personally reviewed and evaluated these images and lab results as part of my medical decision-making.   EKG Interpretation   Date/Time:  Monday January 03 2016 07:23:48 EDT Ventricular Rate:  56 PR Interval:  155 QRS Duration: 106 QT Interval:  425 QTC Calculation: 410 R Axis:   22 Text Interpretation:  Sinus rhythm Within normal limits No previous  tracing Confirmed by Maryan Rued  MD, Loree Fee ([id number]) on 01/03/2016 7:44:52 AM      MDM   Final diagnoses:  Atypical chest pain   Patient is a 25 year old female postpartum 2 months presenting with 3 weeks of intermittent chest pain radiates to her upper back. The chest pain is in the left upper chest it's always been in the same spot. Worse with taking a deep breath, sometimes movement and sometimes eating. She denies acidic taste in her mouth when she wakes up and has not had significant heartburn. She has no abdominal pain or reproducible chest pain on exam. No unilateral leg pain or swelling. Physical exam is within normal limits. Labs including a d-dimer are within normal limits. Feel most likely  patient's chest pain is chest wall pain or atypical chest pain.  She does have a history of asthma and has occasional cough. She does not have an inhaler at home so she was given 1.  Low suspicion for dissection, PE, ACS.  I personally performed the services described in this documentation, which was scribed in my presence.  The recorded information has been reviewed and considered.        Blanchie Dessert, MD 01/03/16 [id number]  Blanchie Dessert, MD 01/03/16 [phone]

## 2016-01-07 ENCOUNTER — Ambulatory Visit (INDEPENDENT_AMBULATORY_CARE_PROVIDER_SITE_OTHER): Payer: 59 | Admitting: Obstetrics and Gynecology

## 2016-01-07 ENCOUNTER — Encounter: Payer: Self-pay | Admitting: Obstetrics and Gynecology

## 2016-01-07 VITALS — BP 120/76 | HR 76 | Wt 193.8 lb

## 2016-01-07 DIAGNOSIS — Z975 Presence of (intrauterine) contraceptive device: Secondary | ICD-10-CM | POA: Diagnosis not present

## 2016-01-07 DIAGNOSIS — N939 Abnormal uterine and vaginal bleeding, unspecified: Secondary | ICD-10-CM

## 2016-01-07 DIAGNOSIS — R252 Cramp and spasm: Secondary | ICD-10-CM | POA: Diagnosis not present

## 2016-01-07 DIAGNOSIS — N921 Excessive and frequent menstruation with irregular cycle: Secondary | ICD-10-CM

## 2016-01-07 MED ORDER — MEGESTROL ACETATE 40 MG PO TABS: 40.0000 mg | ORAL_TABLET | Freq: Three times a day (TID) | ORAL | Status: DC

## 2016-01-07 NOTE — Progress Notes (Signed)
°   [facility] Visit  01/07/2016           Patient name: Emily Morales MRN [id number]  Date of birth: 27-Feb-1991  CC & HPI:  Emily WILSHUSEN is a 25 y.o. female presenting today for follow up and IUD recheck. Pt states that she was at the end portion of her period when the IUD was placed. Pt states that she is still bleeding since the insertion of her IUD. Pt has associated symptoms of mild vaginal bleeding and abdominal cramping. Pt denies any other symptoms.   Pt also notes that she was recently seen in the ED for CP. Pt still has intermittent, sharp, CP. Pt denies her CP being worsened with deep breathing. Denies any other symptoms at this time.   ROS:  ROS  +Vaginal bleeding +Abdominal cramping   Pertinent History Reviewed:   Reviewed: Significant for Medical         Past Medical History  Diagnosis Date   Ovarian cyst, right    Abnormal Pap smear    Hx of chlamydia infection    Headache(784.0)    Uterine fibroid                               Surgical Hx:    Past Surgical History  Procedure Laterality Date   Colposcopy     Lipoma surgery     Medications: Reviewed & Updated - see associated section                       Current outpatient prescriptions:    ibuprofen (ADVIL,MOTRIN) 600 MG tablet, Take 1 tablet (600 mg total) by mouth every 6 (six) hours. (Patient taking differently: Take 600 mg by mouth as needed. ), Disp: 30 tablet, Rfl: 0   fluticasone (FLONASE) 50 MCG/ACT nasal spray, Place 2 sprays into both nostrils daily. (Patient not taking: Reported on 01/07/2016), Disp: 16 g, Rfl: 6   Social History: Reviewed -  reports that she has never smoked. She has never used smokeless tobacco.  Objective Findings:  Vitals: Blood pressure 120/76, pulse 76, weight 193 lb 12.8 oz (87.907 kg), last menstrual period 12/05/2015, not currently breastfeeding.  Physical Examination: Pelvic - VAGINA: moderate amount of bleeding in vault. normal  appearing vagina with normal color and discharge, no lesions,  UTERUS: uterus is normal size, shape, consistency and non-tender CERVIX: normal appearing cervix without discharge or lesions, IUD string visualized.   Assessment & Plan:   A:  1. Bleeding with IUD 2 anemia s/p delivery, chronic P:  1. Megace Rx tid til stops then 7 d at qd then stop 2. Began multivitamin with folic acid  3. Follow up PRN     By signing my name below, I, [name], attest that this documentation has been prepared under the direction and in the presence of Jonnie Kind, MD. Electronically Signed: [name], ED Scribe. 01/07/2016. 9:51 AM.   I personally performed the services described in this documentation, which was SCRIBED in my presence. The recorded information has been reviewed and considered accurate. It has been edited as necessary during review. Jonnie Kind, MD

## 2016-01-11 DIAGNOSIS — Z13220 Encounter for screening for lipoid disorders: Secondary | ICD-10-CM | POA: Diagnosis not present

## 2016-01-11 DIAGNOSIS — N921 Excessive and frequent menstruation with irregular cycle: Secondary | ICD-10-CM | POA: Diagnosis not present

## 2016-01-11 DIAGNOSIS — R071 Chest pain on breathing: Secondary | ICD-10-CM | POA: Diagnosis not present

## 2016-01-11 DIAGNOSIS — R5383 Other fatigue: Secondary | ICD-10-CM | POA: Diagnosis not present

## 2016-01-11 DIAGNOSIS — D649 Anemia, unspecified: Secondary | ICD-10-CM | POA: Diagnosis not present

## 2016-01-11 DIAGNOSIS — Z131 Encounter for screening for diabetes mellitus: Secondary | ICD-10-CM | POA: Diagnosis not present

## 2016-01-11 DIAGNOSIS — E559 Vitamin D deficiency, unspecified: Secondary | ICD-10-CM | POA: Diagnosis not present

## 2016-01-11 DIAGNOSIS — Z0000 Encounter for general adult medical examination without abnormal findings: Secondary | ICD-10-CM | POA: Diagnosis not present

## 2016-01-21 ENCOUNTER — Telehealth: Payer: 59 | Admitting: Nurse Practitioner

## 2016-01-21 DIAGNOSIS — M545 Low back pain, unspecified: Secondary | ICD-10-CM

## 2016-01-21 MED ORDER — CYCLOBENZAPRINE HCL 10 MG PO TABS: 10.0000 mg | ORAL_TABLET | Freq: Three times a day (TID) | ORAL | Status: DC | PRN

## 2016-01-21 MED ORDER — NAPROXEN 500 MG PO TABS: 500.0000 mg | ORAL_TABLET | Freq: Two times a day (BID) | ORAL | Status: DC

## 2016-01-21 NOTE — Progress Notes (Signed)

## 2016-01-23 ENCOUNTER — Telehealth: Payer: 59 | Admitting: Nurse Practitioner

## 2016-01-23 DIAGNOSIS — R12 Heartburn: Secondary | ICD-10-CM

## 2016-01-23 MED ORDER — OMEPRAZOLE 40 MG PO CPDR: 40.0000 mg | DELAYED_RELEASE_CAPSULE | Freq: Every day | ORAL | Status: DC

## 2016-01-23 NOTE — Progress Notes (Signed)
We are sorry that you are not feeling well.  Here is how we plan to help!  Based on what you shared with me it looks like you most likely have Gastroesophageal Reflux Disease (GERD)  Gastroesophageal reflux disease (GERD) happens when acid from your stomach flows up into the esophagus.  When acid comes in contact with the esophagus, the acid causes sorenss (inflammation) in the esophagus.  Over time, GERD may create small holes (ulcers) in the lining of the esophagus.  I have prescribed Omeprazole, a Protein Pump inhibitor, 20 mg daily until you follow up with a provider.  Your symptoms should improve in the next day or two.  You can use antacids as needed until symptoms resolve.  Call us if your heartburn worsens, you have trouble swallowing, weight loss, spitting up blood or recurrent vomiting.  Home Care:  May include lifestyle changes such as weight loss, quitting smoking and alcohol consumption  Avoid foods and drinks that make your symptoms worse, such as:  Caffeine or alcoholic drinks  Chocolate  Peppermint or mint flavorings  Garlic and onions  Spicy foods  Citrus fruits, such as oranges, lemons, or limes  Tomato-based foods such as sauce, chili, salsa and pizza  Fried and fatty foods  Avoid lying down for 3 hours prior to your bedtime or prior to taking a nap  Eat small, frequent meals instead of a large meals  Wear loose-fitting clothing.  Do not wear anything tight around your waist that causes pressure on your stomach.  Raise the head of your bed 6 to 8 inches with wood blocks to help you sleep.  Extra pillows will not help.  Seek Help Right Away If:  You have pain in your arms, neck, jaw, teeth or back  Your pain increases or changes in intensity or duration  You develop nausea, vomiting or sweating (diaphoresis)  You develop shortness of breath or you faint  Your vomit is green, yellow, black or looks like coffee grounds or blood  Your stool is red,  bloody or black  These symptoms could be signs of other problems, such as heart disease, gastric bleeding or esophageal bleeding.  Make sure you :  Understand these instructions.  Will watch your condition.  Will get help right away if you are not doing well or get worse.  Your e-visit answers were reviewed by a board certified advanced clinical practitioner to complete your personal care plan.  Depending on the condition, your plan could have included both over the counter or prescription medications.  If there is a problem please reply  once you have received a response from your provider.  Your safety is important to us.  If you have drug allergies check your prescription carefully.    You can use MyChart to ask questions about today's visit, request a non-urgent call back, or ask for a work or school excuse for 24 hours related to this e-Visit. If it has been greater than 24 hours you will need to follow up with your provider, or enter a new e-Visit to address those concerns.  You will get an e-mail in the next two days asking about your experience.  I hope that your e-visit has been valuable and will speed your recovery. Thank you for using e-visits.   

## 2016-01-24 ENCOUNTER — Other Ambulatory Visit (HOSPITAL_COMMUNITY): Payer: Self-pay | Admitting: Internal Medicine

## 2016-01-24 DIAGNOSIS — D649 Anemia, unspecified: Secondary | ICD-10-CM | POA: Diagnosis not present

## 2016-01-24 DIAGNOSIS — R071 Chest pain on breathing: Secondary | ICD-10-CM | POA: Diagnosis not present

## 2016-01-24 DIAGNOSIS — N921 Excessive and frequent menstruation with irregular cycle: Secondary | ICD-10-CM | POA: Diagnosis not present

## 2016-01-24 DIAGNOSIS — E559 Vitamin D deficiency, unspecified: Secondary | ICD-10-CM | POA: Diagnosis not present

## 2016-01-24 DIAGNOSIS — R079 Chest pain, unspecified: Secondary | ICD-10-CM

## 2016-01-28 ENCOUNTER — Ambulatory Visit (HOSPITAL_COMMUNITY)
Admission: RE | Admit: 2016-01-28 | Discharge: 2016-01-28 | Disposition: A | Discharge status: Home / Self Care | Payer: 59 | Source: Ambulatory Visit | Attending: Internal Medicine | Admitting: Internal Medicine

## 2016-01-28 DIAGNOSIS — R079 Chest pain, unspecified: Secondary | ICD-10-CM | POA: Insufficient documentation

## 2016-01-28 DIAGNOSIS — R0789 Other chest pain: Secondary | ICD-10-CM | POA: Diagnosis not present

## 2016-01-28 MED ORDER — IOPAMIDOL (ISOVUE-370) INJECTION 76%
100.0000 mL | Freq: Once | INTRAVENOUS | Status: AC | PRN
  Administered 2016-01-28: 100 mL via INTRAVENOUS

## 2016-02-22 ENCOUNTER — Encounter: Payer: Self-pay | Admitting: Obstetrics and Gynecology

## 2016-02-22 ENCOUNTER — Other Ambulatory Visit: Payer: Self-pay | Admitting: Obstetrics and Gynecology

## 2016-02-23 MED FILL — LEVOTHYROXINE 25 MCG TABLET: 25 | 30 days supply | Qty: 30 | Fill #0

## 2016-02-28 DIAGNOSIS — E559 Vitamin D deficiency, unspecified: Secondary | ICD-10-CM | POA: Diagnosis not present

## 2016-02-28 DIAGNOSIS — R071 Chest pain on breathing: Secondary | ICD-10-CM | POA: Diagnosis not present

## 2016-02-28 DIAGNOSIS — D649 Anemia, unspecified: Secondary | ICD-10-CM | POA: Diagnosis not present

## 2016-02-28 DIAGNOSIS — E039 Hypothyroidism, unspecified: Secondary | ICD-10-CM | POA: Diagnosis not present

## 2016-02-28 DIAGNOSIS — R5383 Other fatigue: Secondary | ICD-10-CM | POA: Diagnosis not present

## 2016-02-28 MED FILL — predniSONE 20 MG TABS: 20 | 5 days supply | Qty: 10 | Fill #0

## 2016-03-06 ENCOUNTER — Other Ambulatory Visit: Payer: Self-pay | Admitting: Obstetrics & Gynecology

## 2016-03-06 ENCOUNTER — Encounter: Payer: Self-pay | Admitting: Obstetrics & Gynecology

## 2016-03-07 ENCOUNTER — Other Ambulatory Visit: Payer: Self-pay | Admitting: Obstetrics & Gynecology

## 2016-03-07 MED ORDER — MEGESTROL ACETATE 40 MG PO TABS: 40.0000 mg | ORAL_TABLET | Freq: Three times a day (TID) | ORAL | 11 refills | Status: DC

## 2016-03-17 DIAGNOSIS — H5213 Myopia, bilateral: Secondary | ICD-10-CM | POA: Diagnosis not present

## 2016-03-17 DIAGNOSIS — H52222 Regular astigmatism, left eye: Secondary | ICD-10-CM | POA: Diagnosis not present

## 2016-03-24 MED FILL — LEVOTHYROXINE 25 MCG TABLET: 25 | 30 days supply | Qty: 30 | Fill #1

## 2016-04-02 ENCOUNTER — Telehealth: Payer: 59 | Admitting: Family

## 2016-04-02 DIAGNOSIS — T63894A Toxic effect of contact with other venomous animals, undetermined, initial encounter: Secondary | ICD-10-CM

## 2016-04-02 DIAGNOSIS — T63484A Toxic effect of venom of other arthropod, undetermined, initial encounter: Secondary | ICD-10-CM

## 2016-04-02 NOTE — Progress Notes (Signed)
E Visit for Insect Sting  Thank you for describing the insect sting for [place].  Here is how we plan to help!  Based on the information you have shared with me it looks like you have: An uncomplicated insect sting that just occurred and can be closely followed using the instructions in your care plan.  The 2 greatest risks from insect stings are allergic reaction, which can be fatal in some people and infection, which is more common and less serious.  Bees, wasps, yellow jackets, and hornets belong to a class of insects called Hymenoptera.  Most insect stings cause only minor discomfort.  Stings can happen anywhere on the body and can be painful.  Most stings are from honey bees or yellow jackets.  Fire ants can sting multiple times.  The sites of the stings are more likely to become infected.    Provided a home care guide for insect stings and instructions on when to call for help.  What can be used to prevent Insect Stings?   Insect repellant with at least 20% DEET.    Wearing long pants and shirts with socks and shoes.    Wear dark or drab-colored clothes rather than bright colors.    Avoid using perfumes and hair sprays; these attract insects.  HOME CARE ADVICE:  1. Stinger removal:  The stinger looks like a tiny black dot in the sting.  Use a fingernail, credit card edge, or knife-edge to scrape it off.  Don't pull it out because it squeezes out more venom.  If the stinger is below the skin surface, leave it alone.  It will be shed with normal skin healing. 2. Use cold compresses to the area of the sting for 10-20 minutes.  You may repeat this as needed to relieve symptoms of pain and swelling. 3.  For pain relief, take acetominophen 650 mg 4-6 hours as needed or ibuprofen 400 mg every 6-8 hours as needed or naproxen 250-500 mg every 12 hours as needed. 4.  You can also use hydrocortisone cream 0.5% or 1% up to 4 times daily as needed for itching. 5.  If the sting becomes  very itchy, take Benadryl 25-50 mg, follow directions on box. 6.  Wash the area 2-3 times daily with antibacterial soap and warm water. 7. Call your Doctor if:  Fever, a severe headache, or rash occur in the next 2 weeks.  Sting area begins to look infected.  Redness and swelling worsens after home treatment.  Your current symptoms become worse.    MAKE SURE YOU:   Understand these instructions.  Will watch your condition.  Will get help right away if you are not doing well or get worse.  Thank you for choosing an e-visit. Your e-visit answers were reviewed by a board certified advanced clinical practitioner to complete your personal care plan. Depending upon the condition, your plan could have included both over the counter or prescription medications. Please review your pharmacy choice. Be sure that the pharmacy you have chosen is open so that you can pick up your prescription now.  If there is a problem you may message your provider in [place] to have the prescription routed to another pharmacy. Your safety is important to [place]. If you have drug allergies check your prescription carefully.  For the next 24 hours, you can use MyChart to ask questions about today's visit, request a non-urgent call back, or ask for a work or school excuse from your e-visit provider. You  will get an email in the next two days asking about your experience. I hope that your e-visit has been valuable and will speed your recovery.

## 2016-04-25 ENCOUNTER — Ambulatory Visit (INDEPENDENT_AMBULATORY_CARE_PROVIDER_SITE_OTHER): Payer: 59 | Admitting: Obstetrics & Gynecology

## 2016-04-25 ENCOUNTER — Encounter: Payer: Self-pay | Admitting: Obstetrics & Gynecology

## 2016-04-25 VITALS — BP 120/72 | HR 76 | Ht 66.0 in | Wt 204.2 lb

## 2016-04-25 DIAGNOSIS — N898 Other specified noninflammatory disorders of vagina: Secondary | ICD-10-CM

## 2016-04-25 DIAGNOSIS — Z113 Encounter for screening for infections with a predominantly sexual mode of transmission: Secondary | ICD-10-CM

## 2016-04-25 DIAGNOSIS — Z975 Presence of (intrauterine) contraceptive device: Secondary | ICD-10-CM | POA: Diagnosis not present

## 2016-04-25 DIAGNOSIS — N72 Inflammatory disease of cervix uteri: Secondary | ICD-10-CM

## 2016-04-25 MED ORDER — DOXYCYCLINE HYCLATE 100 MG PO TABS: 100.0000 mg | ORAL_TABLET | Freq: Two times a day (BID) | ORAL | 0 refills | Status: DC

## 2016-04-25 MED FILL — DOXYCYCLINE HYCLATE 100 MG: 100 | 10 days supply | Qty: 20 | Fill #0

## 2016-04-25 NOTE — Progress Notes (Signed)
       Chief Complaint  Patient presents with  . brownish discharge, STD screeningg    Blood pressure 120/72, pulse 76, height 5\' 6"  (1.676 m), weight 204 lb 3.2 oz (92.6 kg), not currently breastfeeding.  25 y.o. [id number] No LMP recorded. Patient is not currently having periods (Reason: IUD). The current method of family planning is IUD.  Subjective Vaginal discharge for 41months Itching no Irritation no Odor yes Similar to previous yes  Previous treatment none  Objective Vulva:  normal appearing vulva with no masses, tenderness or lesions Vagina:  normal mucosa, thin grey discharge Cervix:  no cervical motion tenderness and no lesions Uterus:   Adnexa: ovaries:,       Pertinent ROS Delivery 6 months ago IUD present  Labs or studies Wet Prep:   A sample of vaginal discharge was obtained from the posterior fornix using a cotton swab. 2 drops of saline were placed on a slide and the cotton swab was immersed in the saline. Microscopic evaluation was performed and results were as follows:  Negative  for yeast  Negative for clue cells , consistent with Bacterial vaginosis Negative for trichomonas  Normal WBC population   Whiff test: Negative     Impression Diagnoses this Encounter::   ICD-9-CM ICD-10-CM   1. Cervicitis and endocervicitis 616.0 N72   2. Screening examination for STD (sexually transmitted disease) V74.5 Z11.3 GC/Chlamydia Probe Amp    Established relevant diagnosis(es):   Plan/Recommendations: Meds ordered this encounter  Medications  . levothyroxine (SYNTHROID, LEVOTHROID) 25 MCG tablet    Sig: Take 25 mcg by mouth daily before breakfast.     Refill:  1  . doxycycline (VIBRA-TABS) 100 MG tablet    Sig: Take 1 tablet (100 mg total) by mouth 2 (two) times daily.    Dispense:  20 tablet    Refill:  0    Labs or Scans Ordered: Orders Placed This Encounter  Procedures  . GC/Chlamydia Probe Amp    Management:: Re evaluate in 3 weeks  after course of doxycycline, possibly related to her IUD  Follow up Return in about 3 weeks (around 05/16/2016) for Follow up, with Dr Elonda Husky.      All questions were answered.

## 2016-04-27 LAB — GC/CHLAMYDIA PROBE AMP
Chlamydia trachomatis, NAA: NEGATIVE
Neisseria gonorrhoeae by PCR: NEGATIVE

## 2016-04-30 ENCOUNTER — Telehealth: Payer: 59 | Admitting: Family

## 2016-04-30 ENCOUNTER — Encounter: Payer: Self-pay | Admitting: Obstetrics & Gynecology

## 2016-05-01 NOTE — Progress Notes (Signed)
Based on what you shared with me it looks like you have a serious condition that should be evaluated in a face to face office visit.  If you are having a true medical emergency please call 911.  If you need an urgent face to face visit, [facility] has four urgent care centers for your convenience.  If you need care fast and have a high deductible or no insurance consider:   [email or web address]  [phone]  [address], [address] [place], [place] [address] 8 am to 8 pm Monday-Friday 10 am to 4 pm Saturday-Sunday   The following sites will take your  insurance:    . [facility]  [phone] Get Driving Directions Find a Provider at this Location  [address] [place], [place] [address] . 10 am to 8 pm Monday-Friday . 12 pm to 8 pm Saturday-Sunday   . [facility]  [phone] Get Driving Directions Find a Provider at this Location  [address], [address] [place], [place] [address] . 8 am to 8 pm Monday-Friday . 9 am to 6 pm Saturday . 11 am to 6 pm Sunday   . [facility]  [phone] Get Driving Directions  [address] [place], [place] [address] . 8 am to 8 pm Monday-Friday . 8 am to 4 pm Saturday-Sunday   . [facility] (walk-ins welcome, or call for a scheduled time)  [phone]  Get Driving Directions Find a Provider at this Location  [address] [place], [place] [address] . 8 am to 8:30 pm Monday-Thursday . 8 am to 6 pm Friday . 8 am to 4 pm Saturday-Sunday   Your e-visit answers were reviewed by a board certified advanced clinical practitioner to complete your personal care plan.  Thank you for using e-Visits.  

## 2016-05-11 ENCOUNTER — Other Ambulatory Visit: Payer: Self-pay | Admitting: Family

## 2016-05-11 ENCOUNTER — Emergency Department (HOSPITAL_COMMUNITY)
Admission: EM | Admit: 2016-05-11 | Discharge: 2016-05-12 | Disposition: A | Discharge status: Home / Self Care | Payer: 59 | Attending: Emergency Medicine | Admitting: Emergency Medicine

## 2016-05-11 ENCOUNTER — Encounter (HOSPITAL_COMMUNITY): Payer: Self-pay | Admitting: Emergency Medicine

## 2016-05-11 ENCOUNTER — Telehealth: Payer: 59 | Admitting: Physician Assistant

## 2016-05-11 DIAGNOSIS — R531 Weakness: Secondary | ICD-10-CM | POA: Insufficient documentation

## 2016-05-11 DIAGNOSIS — E039 Hypothyroidism, unspecified: Secondary | ICD-10-CM | POA: Diagnosis not present

## 2016-05-11 DIAGNOSIS — R51 Headache: Secondary | ICD-10-CM | POA: Diagnosis not present

## 2016-05-11 DIAGNOSIS — M549 Dorsalgia, unspecified: Secondary | ICD-10-CM | POA: Diagnosis not present

## 2016-05-11 DIAGNOSIS — R7989 Other specified abnormal findings of blood chemistry: Secondary | ICD-10-CM | POA: Diagnosis not present

## 2016-05-11 DIAGNOSIS — R509 Fever, unspecified: Secondary | ICD-10-CM | POA: Diagnosis not present

## 2016-05-11 LAB — BASIC METABOLIC PANEL
ANION GAP: 7 (ref 5–15)
BUN: 11 mg/dL (ref 6–20)
CO2: 24 mmol/L (ref 22–32)
Calcium: 8.7 mg/dL — ABNORMAL LOW (ref 8.9–10.3)
Chloride: 102 mmol/L (ref 101–111)
Creatinine, Ser: 0.65 mg/dL (ref 0.44–1.00)
GLUCOSE: 96 mg/dL (ref 65–99)
POTASSIUM: 3.4 mmol/L — AB (ref 3.5–5.1)
Sodium: 133 mmol/L — ABNORMAL LOW (ref 135–145)

## 2016-05-11 LAB — CBC WITH DIFFERENTIAL/PLATELET
BASOS ABS: 0 10*3/uL (ref 0.0–0.1)
Basophils Relative: 0 %
EOS ABS: 0 10*3/uL (ref 0.0–0.7)
EOS PCT: 1 %
HEMATOCRIT: 32.9 % — AB (ref 36.0–46.0)
Hemoglobin: 10.7 g/dL — ABNORMAL LOW (ref 12.0–15.0)
LYMPHS ABS: 0.9 10*3/uL (ref 0.7–4.0)
Lymphocytes Relative: 24 %
MCH: 25.7 pg — ABNORMAL LOW (ref 26.0–34.0)
MCHC: 32.5 g/dL (ref 30.0–36.0)
MCV: 78.9 fL (ref 78.0–100.0)
MONO ABS: 0.3 10*3/uL (ref 0.1–1.0)
MONOS PCT: 8 %
NEUTROS PCT: 67 %
Neutro Abs: 2.4 10*3/uL (ref 1.7–7.7)
PLATELETS: 205 10*3/uL (ref 150–400)
RBC: 4.17 MIL/uL (ref 3.87–5.11)
RDW: 15.6 % — ABNORMAL HIGH (ref 11.5–15.5)
WBC: 3.5 10*3/uL — ABNORMAL LOW (ref 4.0–10.5)

## 2016-05-11 NOTE — ED Triage Notes (Signed)
Pt reports fever, chills, R flank pain and flu like symptoms, onset today. Pt seen by Dr. Anastasio Champion today, sent for flu swab. Pt's urine was positive for bacteria. Given script for Cipro.

## 2016-05-11 NOTE — Progress Notes (Signed)
Based on what you shared with me it looks like you have a serious condition that should be evaluated in a face to face office visit. As noted at your e-visit the other day for similar symptoms, you need assessment in office for a good examination to determine the issue. Your symptoms have been present for some time and are not responding to typical medications for back pain.  If you are having a true medical emergency please call 911.  If you need an urgent face to face visit, [facility] has four urgent care centers for your convenience.  If you need care fast and have a high deductible or no insurance consider:   [email or web address]  [phone]  [address] [place], [place] [address] 8 am to 8 pm Monday-Friday 10 am to 4 pm Saturday-Sunday   The following sites will take your  insurance:    . [facility] [place] a Provider at this Location  [address] [place], [place] [address] . 10 am to 8 pm Monday-Friday . 12 pm to 8 pm Saturday-Sunday   . [facility] at [place] a Provider at this Location  [address] [place], [place], [place] [address] . 8 am to 8 pm Monday-Friday . 9 am to 6 pm Saturday . 11 am to 6 pm Sunday   . [facility] at [place] Get Driving Directions  [id number] [place], [place] [address] . 8 am to 8 pm Monday-Friday . 8 am to 4 pm Saturday-Sunday   . [facility] (walk-ins welcome, or call for a scheduled time)  [phone]  Get Driving Directions Find a Provider at this Location  [place], [place] [address] . 8 am to 8:30 pm Monday-Thursday . 8 am to 6 pm Friday . 8 am to 4 pm Saturday-Sunday   Your e-visit answers were reviewed by a board certified advanced clinical practitioner to complete your personal  care plan.  Thank you for using e-Visits.

## 2016-05-12 ENCOUNTER — Emergency Department (HOSPITAL_COMMUNITY): Payer: 59

## 2016-05-12 DIAGNOSIS — E039 Hypothyroidism, unspecified: Secondary | ICD-10-CM | POA: Diagnosis not present

## 2016-05-12 DIAGNOSIS — R531 Weakness: Secondary | ICD-10-CM | POA: Diagnosis not present

## 2016-05-12 DIAGNOSIS — R1011 Right upper quadrant pain: Secondary | ICD-10-CM | POA: Diagnosis not present

## 2016-05-12 DIAGNOSIS — R7989 Other specified abnormal findings of blood chemistry: Secondary | ICD-10-CM | POA: Diagnosis not present

## 2016-05-12 DIAGNOSIS — R509 Fever, unspecified: Secondary | ICD-10-CM | POA: Diagnosis not present

## 2016-05-12 LAB — URINALYSIS, ROUTINE W REFLEX MICROSCOPIC
Bilirubin Urine: NEGATIVE
Glucose, UA: NEGATIVE mg/dL
KETONES UR: NEGATIVE mg/dL
LEUKOCYTES UA: NEGATIVE
NITRITE: NEGATIVE
PROTEIN: NEGATIVE mg/dL
Specific Gravity, Urine: 1.01 (ref 1.005–1.030)
pH: 6 (ref 5.0–8.0)

## 2016-05-12 LAB — URINE MICROSCOPIC-ADD ON

## 2016-05-12 LAB — PREGNANCY, URINE: PREG TEST UR: NEGATIVE

## 2016-05-12 LAB — INFLUENZA PANEL BY PCR (TYPE A & B)
H1N1FLUPCR: NOT DETECTED
INFLBPCR: NEGATIVE
Influenza A By PCR: NEGATIVE

## 2016-05-12 MED ORDER — ACETAMINOPHEN 325 MG PO TABS: 650.0000 mg | ORAL_TABLET | Freq: Four times a day (QID) | ORAL | 0 refills | Status: DC | PRN

## 2016-05-12 MED ORDER — ONDANSETRON 8 MG PO TBDP: 8.0000 mg | ORAL_TABLET | Freq: Three times a day (TID) | ORAL | 0 refills | Status: DC | PRN

## 2016-05-12 MED ORDER — IBUPROFEN 800 MG PO TABS
800.0000 mg | ORAL_TABLET | Freq: Once | ORAL | Status: AC
  Administered 2016-05-12: 800 mg via ORAL
  Filled 2016-05-12: qty 1

## 2016-05-12 MED ORDER — SODIUM CHLORIDE 0.9 % IV BOLUS (SEPSIS)
1000.0000 mL | Freq: Once | INTRAVENOUS | Status: AC
  Administered 2016-05-12: 1000 mL via INTRAVENOUS

## 2016-05-12 MED ORDER — IOPAMIDOL (ISOVUE-300) INJECTION 61%
100.0000 mL | Freq: Once | INTRAVENOUS | Status: AC | PRN
  Administered 2016-05-12: 100 mL via INTRAVENOUS

## 2016-05-12 NOTE — Discharge Instructions (Signed)
All the results in the ER are normal, labs and imaging. We are not sure what is causing your symptoms. The workup in the ER is not complete, and is limited to screening for life threatening and emergent conditions only, so please see a primary care doctor for further evaluation.  

## 2016-05-12 NOTE — ED Provider Notes (Signed)
[facility] DEPT Provider Note   CSN: [id number] Arrival date & time: 05/11/16  2102     History   Chief Complaint Chief Complaint  Patient presents with  . Abnormal Lab    HPI Emily Morales is a 25 y.o. female 6 months post partum and not breast feeding presenting for evaluation of possible influenza.  She reports having flu like symptoms including sudden onset of myalgias which started earlier in the day, in association with fever to 103 which she treated with tylenol, last dose around 3 pm. In addition she reports right flank and lower back pain.  She was seen by her pcp Dr. Anastasio Champion and was told she had an "abnormal" urine test and was prescribed ciprofloxacin which she will pick up in the morning.  In the interim,  Dr. Anastasio Champion wanted her to be tested for influenza so she presents here as she was unable to make it to the outpatient lab in time to have this test performed.  She endorses generalized fatigue along with headache and mild nausea.  She denies coryza type sx including no sore throat, has had no coughing, sob, chest or abdominal pain and really denies any urinary symptoms.  The history is provided by the patient.    Past Medical History:  Diagnosis Date  . Abnormal Pap smear   . Headache(784.0)   . Hx of chlamydia infection   . Hypothyroidism   . Ovarian cyst, right   . Uterine fibroid     Patient Active Problem List   Diagnosis Date Noted  . Encounter for IUD insertion liletta 12/10/2015  . Mild postpartum depression 11/17/2015  . Umbilical hernia without obstruction and without gangrene 09/08/2015  . Oligouria 08/17/2015  . Mild dysplasia of cervix 03/26/2014    Past Surgical History:  Procedure Laterality Date  . COLPOSCOPY    . lipoma surgery      OB History    Gravida Para Term Preterm AB Living   2 2 2  0 0 2   SAB TAB Ectopic Multiple Live Births   0 0 0 0 2       Home Medications    Prior to Admission medications   Medication Sig  Start Date End Date Taking? Authorizing Provider  doxycycline (VIBRA-TABS) 100 MG tablet Take 1 tablet (100 mg total) by mouth 2 (two) times daily. 04/25/16   Florian Buff, MD  levothyroxine (SYNTHROID, LEVOTHROID) 25 MCG tablet Take 25 mcg by mouth daily before breakfast.  02/23/16   Historical Provider, MD    Family History Family History  Problem Relation Age of Onset  . Diabetes Father   . Hypertension Father   . Other Son     reactive airway disease  . Asthma Son   . Heart murmur Son   . Cancer Maternal Grandmother     lung  . Heart disease Paternal Grandmother     Social History Social History  Substance Use Topics  . Smoking status: Never Smoker  . Smokeless tobacco: Never Used  . Alcohol use Yes     Comment: occ     Allergies   Pollen extract; Latex; and Other   Review of Systems Review of Systems  Constitutional: Positive for fatigue and fever.  HENT: Negative for congestion and sore throat.   Eyes: Negative.   Respiratory: Negative for chest tightness and shortness of breath.   Cardiovascular: Negative for chest pain.  Gastrointestinal: Negative for abdominal pain and nausea.  Genitourinary: Positive for flank  pain. Negative for dysuria, frequency, hematuria and urgency.  Musculoskeletal: Positive for myalgias. Negative for arthralgias, joint swelling, neck pain and neck stiffness.  Skin: Negative.  Negative for rash and wound.  Neurological: Positive for headaches. Negative for dizziness, weakness, light-headedness and numbness.  Psychiatric/Behavioral: Negative.      Physical Exam Updated Vital Signs BP 128/74 (BP Location: Right Arm)   Pulse 73   Temp 98.9 F (37.2 C) (Oral)   Resp 18   Ht 5\' 6"  (1.676 m)   Wt 92.6 kg   SpO2 100%   BMI 32.96 kg/m   Physical Exam  Constitutional: She appears well-developed and well-nourished. No distress.  HENT:  Head: Normocephalic and atraumatic.  Eyes: Conjunctivae are normal.  Neck: Normal range of  motion.  Cardiovascular: Normal rate, regular rhythm, normal heart sounds and intact distal pulses.   Pulmonary/Chest: Effort normal and breath sounds normal. She has no wheezes.  Abdominal: Soft. Bowel sounds are normal. There is tenderness in the left upper quadrant. There is CVA tenderness. There is no rigidity, no rebound and no guarding.  Right cva tenderness.  ttp deep palpation luq.   Musculoskeletal: Normal range of motion.  Neurological: She is alert.  Skin: Skin is warm and dry.  Psychiatric: She has a normal mood and affect.  Nursing note and vitals reviewed.    ED Treatments / Results  Labs (all labs ordered are listed, but only abnormal results are displayed) Labs Reviewed  CBC WITH DIFFERENTIAL/PLATELET - Abnormal; Notable for the following:       Result Value   WBC 3.5 (*)    Hemoglobin 10.7 (*)    HCT 32.9 (*)    MCH 25.7 (*)    RDW 15.6 (*)    All other components within normal limits  BASIC METABOLIC PANEL - Abnormal; Notable for the following:    Sodium 133 (*)    Potassium 3.4 (*)    Calcium 8.7 (*)    All other components within normal limits  URINALYSIS, ROUTINE W REFLEX MICROSCOPIC (NOT AT [facility])  PREGNANCY, URINE  INFLUENZA PANEL BY PCR (TYPE A & B, H1N1)    EKG  EKG Interpretation None       Radiology No results found.  Procedures Procedures (including critical care time)  Medications Ordered in ED Medications  ibuprofen (ADVIL,MOTRIN) tablet 800 mg (not administered)  sodium chloride 0.9 % bolus 1,000 mL (not administered)     Initial Impression / Assessment and Plan / ED Course  I have reviewed the triage vital signs and the nursing notes.  Pertinent labs & imaging results that were available during my care of the patient were reviewed by me and considered in my medical decision making (see chart for details).  Clinical Course    Pt with right cva pain, flu like sx, hx of "abnormal urine" per pt. Pending UA at this time. Suspect  pyelonephritis. Influenza ordered as well as pcp desired this test completed (pt presents with order).  IV fluids given, ibuprofen for fever/pain,  Endorses current headache.   Pt discussed with Dr. Kathrynn Humble who will follow and dispo pt.   Final Clinical Impressions(s) / ED Diagnoses   Final diagnoses:  None    New Prescriptions New Prescriptions   No medications on file     Evalee Jefferson, PA-C 05/12/16 0106    Varney Biles, MD 05/12/16 [phone]

## 2016-05-13 LAB — URINE CULTURE

## 2016-05-16 ENCOUNTER — Encounter: Payer: Self-pay | Admitting: Obstetrics & Gynecology

## 2016-05-16 ENCOUNTER — Ambulatory Visit (INDEPENDENT_AMBULATORY_CARE_PROVIDER_SITE_OTHER): Payer: 59 | Admitting: Obstetrics & Gynecology

## 2016-05-16 VITALS — BP 110/80 | HR 80 | Wt 206.0 lb

## 2016-05-16 DIAGNOSIS — N921 Excessive and frequent menstruation with irregular cycle: Secondary | ICD-10-CM

## 2016-05-16 DIAGNOSIS — Z975 Presence of (intrauterine) contraceptive device: Secondary | ICD-10-CM

## 2016-05-16 DIAGNOSIS — N72 Inflammatory disease of cervix uteri: Secondary | ICD-10-CM | POA: Diagnosis not present

## 2016-05-16 DIAGNOSIS — N898 Other specified noninflammatory disorders of vagina: Secondary | ICD-10-CM

## 2016-05-16 NOTE — Progress Notes (Signed)
    Pt states she feels much better no lower abdominal pain No discharge, just ended her period Period was normal, passed a lot of clots, but hemoglobin 10.7 at hospital  No further therapy or follow up needed   Chief Complaint  Patient presents with  . Follow-up    finish taking Doxycycline    Blood pressure 110/80, pulse 80, weight 206 lb (93.4 kg), last menstrual period 05/08/2016, not currently breastfeeding.  25 y.o. [id number] Patient's last menstrual period was 05/08/2016. The current method of family planning is IUD.  Subjective Vaginal discharge for 67months Itching no Irritation no Odor yes Similar to previous yes  Previous treatment none  Objective Vulva:  normal appearing vulva with no masses, tenderness or lesions Vagina:  normal mucosa, thin grey discharge Cervix:  no cervical motion tenderness and no lesions Uterus:   Adnexa: ovaries:,       Pertinent ROS Delivery 6 months ago IUD present  Labs or studies Wet Prep:   A sample of vaginal discharge was obtained from the posterior fornix using a cotton swab. 2 drops of saline were placed on a slide and the cotton swab was immersed in the saline. Microscopic evaluation was performed and results were as follows:  Negative  for yeast  Negative for clue cells , consistent with Bacterial vaginosis Negative for trichomonas  Normal WBC population   Whiff test: Negative     Impression Diagnoses this Encounter:: No diagnosis found.  Established relevant diagnosis(es):   Plan/Recommendations: No orders of the defined types were placed in this encounter.   Labs or Scans Ordered: No orders of the defined types were placed in this encounter.   Management:: Re evaluate in 3 weeks after course of doxycycline, possibly related to her IUD  Follow up No Follow-up on file.      All questions were answered.

## 2016-07-26 ENCOUNTER — Telehealth: Payer: 59 | Admitting: Family

## 2016-07-26 DIAGNOSIS — B9789 Other viral agents as the cause of diseases classified elsewhere: Secondary | ICD-10-CM

## 2016-07-26 DIAGNOSIS — J069 Acute upper respiratory infection, unspecified: Secondary | ICD-10-CM

## 2016-07-26 MED ORDER — FLUTICASONE PROPIONATE 50 MCG/ACT NA SUSP: 2.0000 | Freq: Every day | NASAL | 6 refills | Status: DC

## 2016-07-26 MED FILL — FLUTICASONE PROP 50 MCG SPR: 50 | 30 days supply | Qty: 16 | Fill #0

## 2016-07-26 NOTE — Progress Notes (Signed)

## 2016-08-23 ENCOUNTER — Encounter: Payer: Self-pay | Admitting: Advanced Practice Midwife

## 2016-08-23 ENCOUNTER — Other Ambulatory Visit: Payer: Self-pay | Admitting: Advanced Practice Midwife

## 2016-08-23 ENCOUNTER — Ambulatory Visit (INDEPENDENT_AMBULATORY_CARE_PROVIDER_SITE_OTHER): Payer: 59 | Admitting: Advanced Practice Midwife

## 2016-08-23 VITALS — BP 120/80 | HR 76 | Wt 205.0 lb

## 2016-08-23 DIAGNOSIS — R102 Pelvic and perineal pain: Secondary | ICD-10-CM

## 2016-08-23 MED ORDER — NAPROXEN 500 MG PO TABS: 500.0000 mg | ORAL_TABLET | Freq: Two times a day (BID) | ORAL | 1 refills | Status: DC

## 2016-08-23 NOTE — Progress Notes (Signed)
[facility] Visit  Patient name: Emily Morales MRN [id number]  Date of birth: 1990-08-23  CC & HPI:  Emily Morales is a 26 y.o. Caucasian female presenting today for pelvic pain for 4 weeks.   Had lyletta IUD place in May,  has not hurt.  Feels like the pain is "exactly like what I had when I was pregnant".  Felt some relief after she gave birth, now feels it again.  Hurts when she moves a certain way when in bed, when legs are apart when having sex.   Pertinent History Reviewed:  Medical & Surgical Hx:   Past Medical History:  Diagnosis Date  . Abnormal Pap smear   . Headache(784.0)   . Hx of chlamydia infection   . Hypothyroidism   . Ovarian cyst, right   . Uterine fibroid    Past Surgical History:  Procedure Laterality Date  . COLPOSCOPY    . lipoma surgery     Family History  Problem Relation Age of Onset  . Diabetes Father   . Hypertension Father   . Other Son     reactive airway disease  . Asthma Son   . Heart murmur Son   . Cancer Maternal Grandmother     lung  . Heart disease Paternal Grandmother     Current Outpatient Prescriptions:  .  acetaminophen (TYLENOL) 325 MG tablet, Take 2 tablets (650 mg total) by mouth every 6 (six) hours as needed. (Patient not taking: Reported on 05/16/2016), Disp: 30 tablet, Rfl: 0 .  fluticasone (FLONASE) 50 MCG/ACT nasal spray, Place 2 sprays into both nostrils daily., Disp: 16 g, Rfl: 6 .  levothyroxine (SYNTHROID, LEVOTHROID) 25 MCG tablet, Take 25 mcg by mouth daily before breakfast. , Disp: , Rfl: 1 .  naproxen (NAPROSYN) 500 MG tablet, Take 1 tablet (500 mg total) by mouth 2 (two) times daily with a meal., Disp: 30 tablet, Rfl: 1 Social History: Reviewed -  reports that she has never smoked. She has never used smokeless tobacco.  Review of Systems:   Constitutional: Negative for fever and chills Eyes: Negative for visual disturbances Respiratory: Negative for shortness of breath,  dyspnea Cardiovascular: Negative for chest pain or palpitations  Gastrointestinal: Negative for vomiting, diarrhea and constipation; no abdominal pain Genitourinary: Negative for dysuria and urgency, vaginal irritation or itching Musculoskeletal: Negative for back pain,  Neurological: Negative for dizziness and headaches    Objective Findings:    Physical Examination: General appearance - well appearing, and in no distress Mental status - alert, oriented to person, place, and time Chest:  Normal respiratory effort Heart - normal rate and regular rhythm Abdomen:  Soft, nontender Pelvic: Bedside US reveals properly placed IUD, normal ovaries per LHE  Tender to bimanual, mainly in internal hand over bladder/muscles  Musculoskeletal:  Normal range of motion without pain Extremities:  No edema    No results found for this or any previous visit (from the past 24 hour(s)).    Assessment & Plan:  A:   ? Musculoskeletal vs infection in bladder Orders Placed This Encounter  Procedures  . GC/Chlamydia Probe Amp  . Urine culture    P:  Naproxyn prn   Return if symptoms worsen or fail to improve.  CRESENZO-DISHMAN,Cozetta Seif CNM 08/23/2016 2:11 PM

## 2016-08-25 LAB — URINE CULTURE

## 2016-08-26 LAB — GC/CHLAMYDIA PROBE AMP
CHLAMYDIA, DNA PROBE: NEGATIVE
Neisseria gonorrhoeae by PCR: NEGATIVE

## 2016-08-29 IMAGING — DX DG CHEST 2V
2 series · 2 of 2 positions shown · non-contrast
Comparison: PA and lateral chest 07/01/2014.

CLINICAL DATA: Intermittent chest pain radiating into the back for
3 weeks, worsened last night and this morning. No known injury.
Initial encounter.

EXAM:
CHEST  2 VIEW

[chest pa]
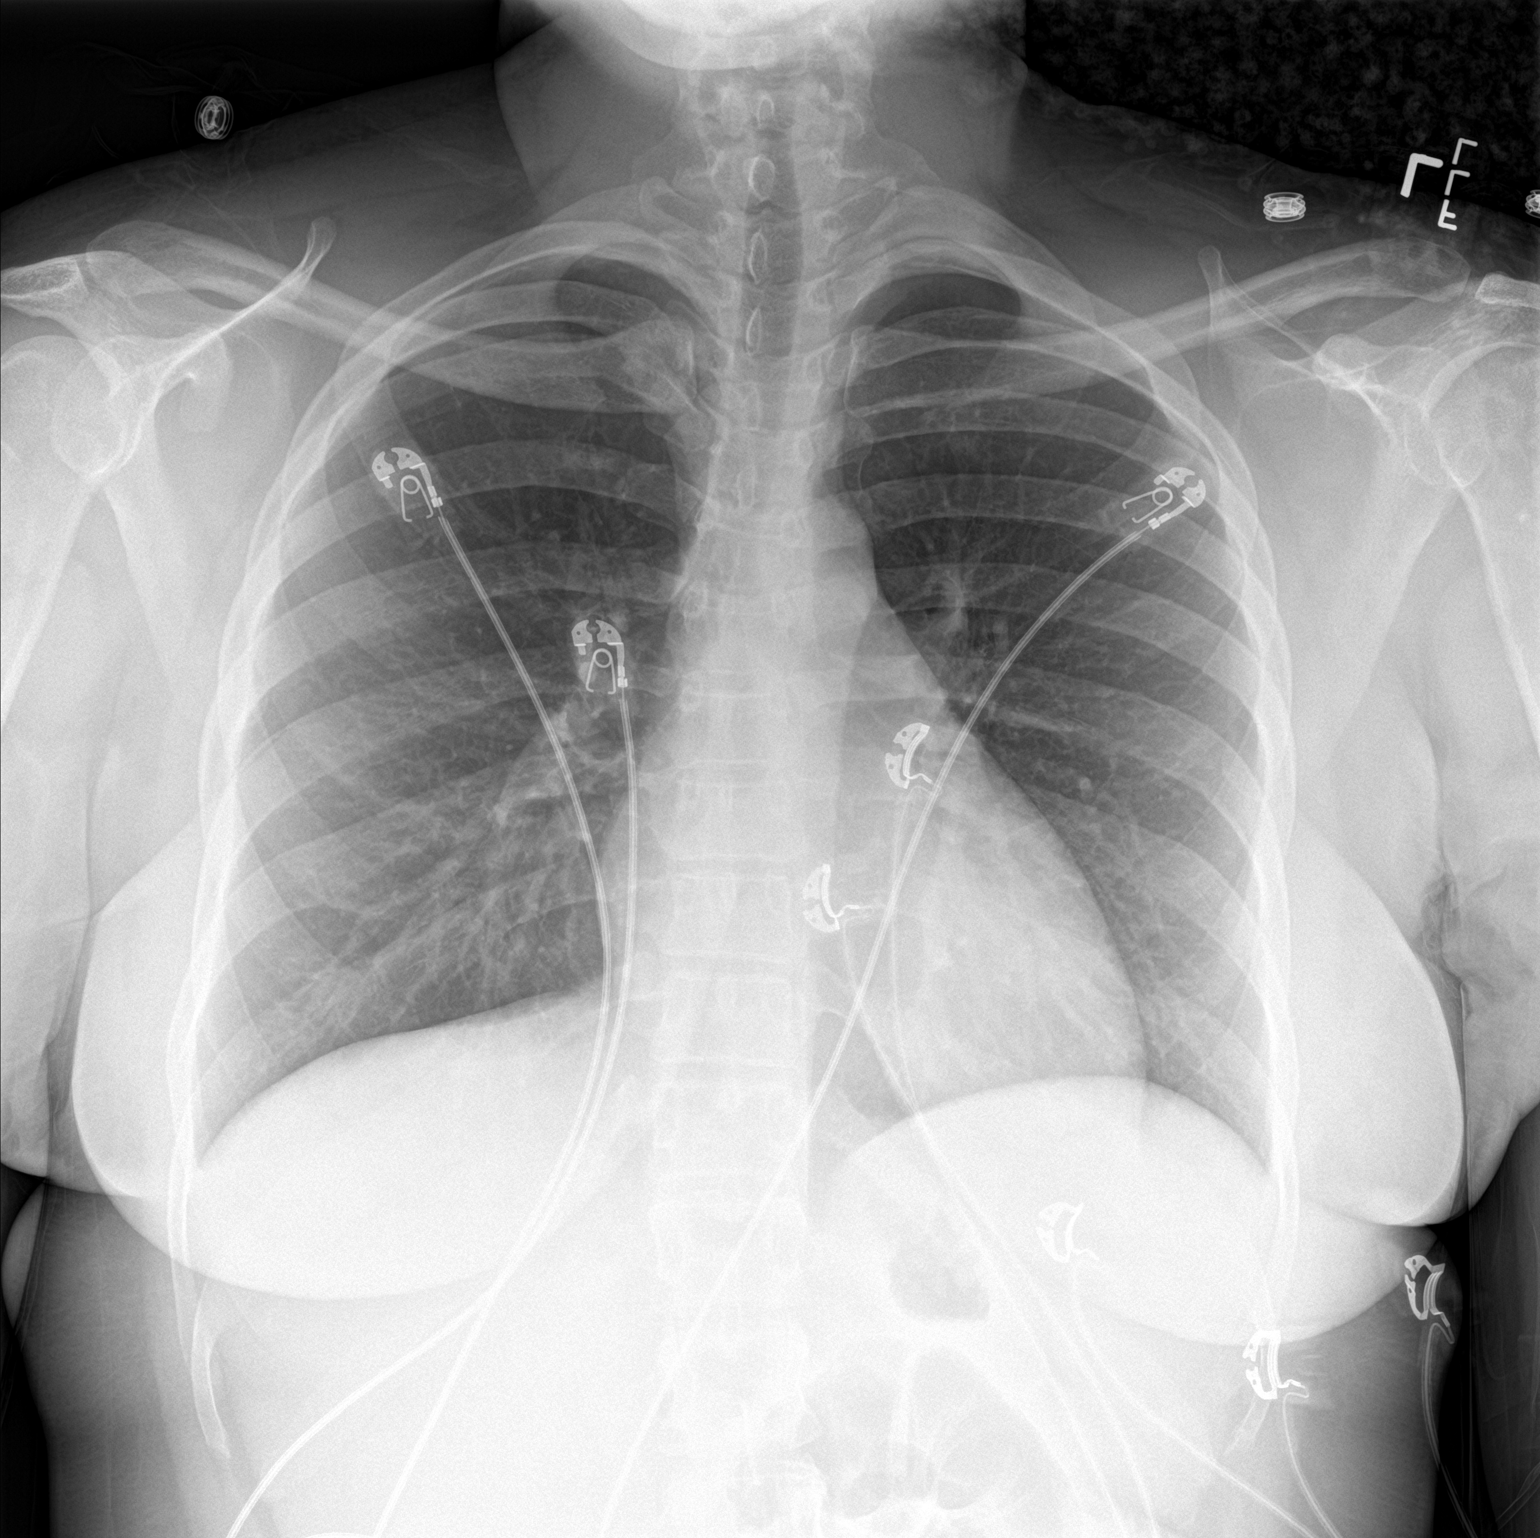

[chest lat]
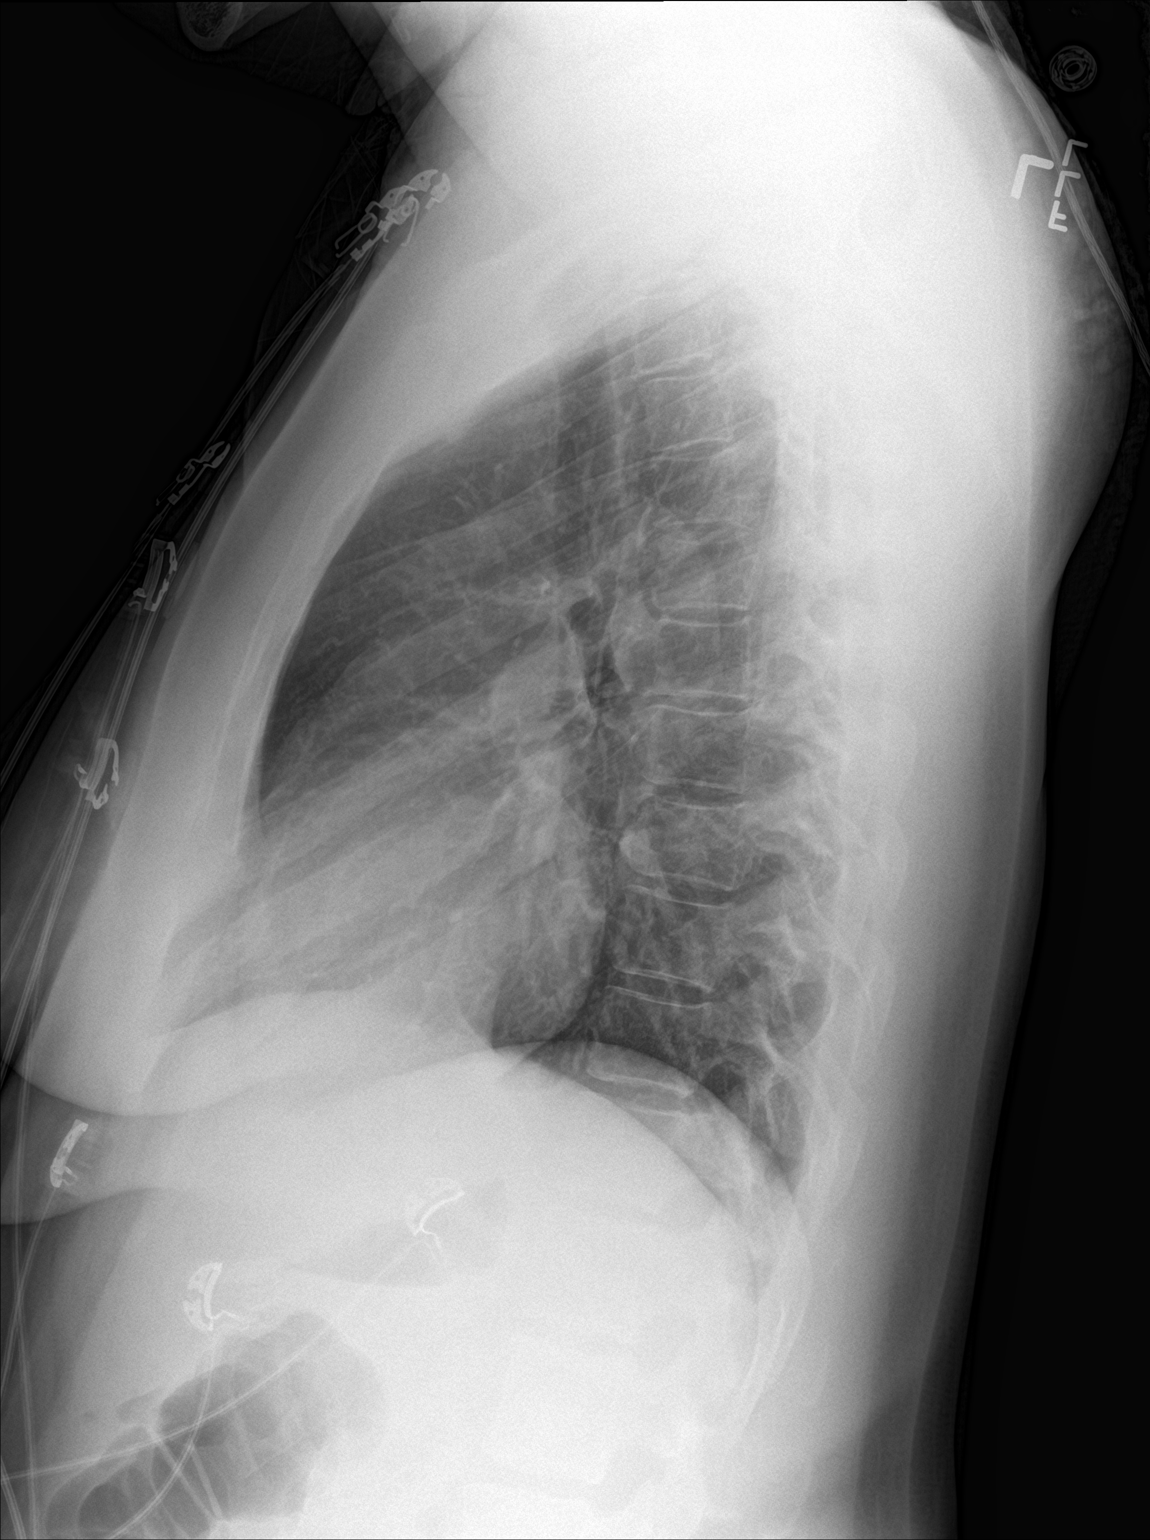

[2 of 2 positions shown; findings below may reference images not displayed]

FINDINGS: The lungs are clear. Heart size is normal. No pneumothorax or
pleural effusion. No bony abnormality is identified.
IMPRESSION: Normal chest.

## 2016-09-28 ENCOUNTER — Other Ambulatory Visit (HOSPITAL_COMMUNITY): Payer: Self-pay | Admitting: Internal Medicine

## 2016-09-28 DIAGNOSIS — R071 Chest pain on breathing: Secondary | ICD-10-CM | POA: Diagnosis not present

## 2016-09-28 DIAGNOSIS — R109 Unspecified abdominal pain: Secondary | ICD-10-CM | POA: Diagnosis not present

## 2016-09-28 DIAGNOSIS — E039 Hypothyroidism, unspecified: Secondary | ICD-10-CM | POA: Diagnosis not present

## 2016-09-28 DIAGNOSIS — E559 Vitamin D deficiency, unspecified: Secondary | ICD-10-CM | POA: Diagnosis not present

## 2016-09-29 ENCOUNTER — Ambulatory Visit (HOSPITAL_COMMUNITY)
Admission: RE | Admit: 2016-09-29 | Discharge: 2016-09-29 | Disposition: A | Discharge status: Home / Self Care | Payer: 59 | Source: Ambulatory Visit | Attending: Internal Medicine | Admitting: Internal Medicine

## 2016-09-29 DIAGNOSIS — R109 Unspecified abdominal pain: Secondary | ICD-10-CM | POA: Insufficient documentation

## 2016-09-29 DIAGNOSIS — R1084 Generalized abdominal pain: Secondary | ICD-10-CM | POA: Diagnosis not present

## 2016-10-08 ENCOUNTER — Telehealth: Payer: 59 | Admitting: Physician Assistant

## 2016-10-08 DIAGNOSIS — M549 Dorsalgia, unspecified: Secondary | ICD-10-CM

## 2016-10-08 NOTE — Progress Notes (Signed)
Based on what you shared with me it looks like you have a condition that should be evaluated in a face to face office visit.  Your pain has been present for greater than 4 weeks. As such this does not constitute an acute back pain but something more subacute or chronic with flares. You need a face-to-face visit for a good examination and potential imaging to determine the best treatment. Treatment via e-visit is not appropriate in this case. NOTE: Even if you have entered your credit card information for this eVisit, you will not be charged.   If you are having a true medical emergency please call 911.  If you need an urgent face to face visit, [facility] has four urgent care centers for your convenience.  If you need care fast and have a high deductible or no insurance consider:   [email or web address]  [phone]  [address], [place], [place] [address] 8 am to 8 pm Monday-Friday 10 am to 4 pm Saturday-Sunday   The following sites will take your  insurance:    . [facility] [place] a Provider at this Location  [address] [place], [place] [address] . 10 am to 8 pm Monday-Friday . 12 pm to 8 pm Saturday-Sunday   . [facility] Urgent [facility] at [place] a Provider at this Location  [place], [place], [place] [address] . 8 am to 8 pm Monday-Friday . 9 am to 6 pm Saturday . 11 am to 6 pm Sunday   . [facility]gent [facility] at [place] Get Driving Directions  [address] [place], [place] [address] . 8 am to 8 pm Monday-Friday . 8 am to 4 pm Saturday-Sunday   Your e-visit answers were reviewed by a board certified advanced clinical practitioner to complete your personal care plan.  Thank you for using e-Visits.

## 2016-10-11 DIAGNOSIS — R109 Unspecified abdominal pain: Secondary | ICD-10-CM | POA: Diagnosis not present

## 2016-10-11 DIAGNOSIS — R071 Chest pain on breathing: Secondary | ICD-10-CM | POA: Diagnosis not present

## 2016-10-11 DIAGNOSIS — F339 Major depressive disorder, recurrent, unspecified: Secondary | ICD-10-CM | POA: Diagnosis not present

## 2016-10-11 DIAGNOSIS — Z6379 Other stressful life events affecting family and household: Secondary | ICD-10-CM | POA: Diagnosis not present

## 2016-10-16 ENCOUNTER — Encounter: Payer: Self-pay | Admitting: Gastroenterology

## 2016-10-20 ENCOUNTER — Telehealth: Payer: 59 | Admitting: Family

## 2016-10-20 DIAGNOSIS — N760 Acute vaginitis: Secondary | ICD-10-CM | POA: Diagnosis not present

## 2016-10-20 DIAGNOSIS — B9689 Other specified bacterial agents as the cause of diseases classified elsewhere: Secondary | ICD-10-CM

## 2016-10-20 MED ORDER — METRONIDAZOLE 500 MG PO TABS: 500.0000 mg | ORAL_TABLET | Freq: Two times a day (BID) | ORAL | 0 refills | Status: DC

## 2016-10-20 MED FILL — metroNIDAZOLE 500 MG TABS: 500 | 7 days supply | Qty: 14 | Fill #0

## 2016-10-20 NOTE — Progress Notes (Signed)

## 2016-11-04 ENCOUNTER — Encounter: Payer: Self-pay | Admitting: Obstetrics and Gynecology

## 2016-11-07 ENCOUNTER — Ambulatory Visit (INDEPENDENT_AMBULATORY_CARE_PROVIDER_SITE_OTHER): Payer: 59 | Admitting: Gastroenterology

## 2016-11-07 ENCOUNTER — Other Ambulatory Visit: Payer: Self-pay

## 2016-11-07 ENCOUNTER — Encounter: Payer: Self-pay | Admitting: Gastroenterology

## 2016-11-07 VITALS — BP 125/73 | HR 73 | Temp 98.2°F | Ht 66.0 in | Wt 200.6 lb

## 2016-11-07 DIAGNOSIS — R079 Chest pain, unspecified: Secondary | ICD-10-CM

## 2016-11-07 DIAGNOSIS — K219 Gastro-esophageal reflux disease without esophagitis: Secondary | ICD-10-CM

## 2016-11-07 DIAGNOSIS — D649 Anemia, unspecified: Secondary | ICD-10-CM | POA: Diagnosis not present

## 2016-11-07 DIAGNOSIS — K5900 Constipation, unspecified: Secondary | ICD-10-CM | POA: Diagnosis not present

## 2016-11-07 DIAGNOSIS — R109 Unspecified abdominal pain: Secondary | ICD-10-CM | POA: Diagnosis not present

## 2016-11-07 MED ORDER — LINACLOTIDE 290 MCG PO CAPS: 290.0000 ug | ORAL_CAPSULE | Freq: Every day | ORAL | 11 refills | Status: DC

## 2016-11-07 MED ORDER — PANTOPRAZOLE SODIUM 40 MG PO TBEC: 40.0000 mg | DELAYED_RELEASE_TABLET | Freq: Every day | ORAL | 11 refills | Status: DC

## 2016-11-07 MED FILL — LINZESS 290 MCG CAPSULE: 290 | 30 days supply | Qty: 30 | Fill #0

## 2016-11-07 MED FILL — PANTOPRAZOLE SOD DR 40 MG T: 40 | 30 days supply | Qty: 30 | Fill #0

## 2016-11-07 NOTE — Patient Instructions (Signed)
1. Upper endoscopy as scheduled. Please see separate instructions. 2. Please have your labs done. 3. Start pantoprazole 40 mg 30 minutes before breakfast daily for reflux symptoms. Prescription sent to pharmacy. 4. Start Linzess 271mcg daily on an empty stomach for constipation. Prescription sent to pharmacy.

## 2016-11-07 NOTE — Assessment & Plan Note (Addendum)
Suspect musculoskeletal based on the fact that symptoms are aggravated by position. Unrelated to meals. Has been responsive to Naprosyn. Plan for EGD as outlined, consider further evaluation if does not explain her symptoms. Labs as outlined.

## 2016-11-07 NOTE — Assessment & Plan Note (Signed)
Postpartum anemia persisted at 6 months out. Plan for repeat labs.

## 2016-11-07 NOTE — Progress Notes (Signed)
cc'ed to pcp °

## 2016-11-07 NOTE — Assessment & Plan Note (Signed)
Postprandial and nocturnal substernal chest pain/back pain likely reflux related. Gallbladder appeared normal on ultrasound, not cannot clearly exclude gallbladder disease/biliary dyskinesia. Symptoms have not been responsive to omeprazole. We will switch to pantoprazole 40 mg daily. Plan for EGD with Dr. Oneida Alar in the OR in the near future.  I have discussed the risks, alternatives, benefits with regards to but not limited to the risk of reaction to medication, bleeding, infection, perforation and the patient is agreeable to proceed. Written consent to be obtained.

## 2016-11-07 NOTE — Assessment & Plan Note (Signed)
Chronic constipation for more than 10 years unmanageable. Add Linzess 240mcg daily on empty stomach. Samples, rebate card, prescription provided. Call if ongoing symptoms or medication too effective.

## 2016-11-07 NOTE — Progress Notes (Addendum)
REVIEWED-NO ADDITIONAL RECOMMENDATIONS.  Primary Care Physician:  Doree Albee, MD  Primary Gastroenterologist:  Barney Drain, MD   Chief Complaint  Patient presents with  . Abdominal Pain    RUQ  . Chest Pain    HPI:  Emily Morales is a 26 y.o. female here At the request of Dr. Anastasio Champion for further evaluation of chronic right flank pain, chest pain. Patient has had symptoms at least since June 2017. Noted after she had her son who is now almost 37 year old. Complains of right flank pain worse with prolonged sitting. Unrelated to meals. Has to change position to get relief. Does affect her sleep. Also with pains that began in the center of her back and radiate into the substernal region. These are worse with meals. Worse when she goes to bed at night. No dysphagia. Has had to start eating wall standing to prevent the symptoms. Had prilosec and naprosyn for over 9 months without relief. Recently stopped medication. No worsening of her symptoms. She also has chronic constipation for years. May have 3 bowel movements per month. Has tried multiple over-the-counter laxatives, fibers, suppositories. Has tried MiraLAX without relief. IUD placed 8 months ago without any cycles following. For the past 2 weeks she's had heavy menstrual bleeding. Plans to see her GYN soon. Hemorrhoids to her pregnancy, thrombosed.    CTA chest in June 2017 was unremarkable. CT abdomen and pelvis October 2017 showed multiple bilateral ovarian cyst largest measuring 3.5 cm on the right, broad-based midline diastases rectus and small fat containing paraumbilical hernia. Right upper quadrant abdominal ultrasound last month as outlined below, unremarkable.   Current Outpatient Prescriptions  Medication Sig Dispense Refill  . acetaminophen (TYLENOL) 325 MG tablet Take 2 tablets (650 mg total) by mouth every 6 (six) hours as needed. 30 tablet 0  . Cholecalciferol (VITAMIN D3) 5000 units TABS Take 1 tablet by mouth  daily.    Marland Kitchen escitalopram (LEXAPRO) 10 MG tablet Take 10 mg by mouth daily.  3  . fluticasone (FLONASE) 50 MCG/ACT nasal spray Place 2 sprays into both nostrils daily. 16 g 6  . Melatonin 5 MG TABS Take 1 tablet by mouth as needed.     No current facility-administered medications for this visit.     Allergies as of 11/07/2016 - Review Complete 11/07/2016  Allergen Reaction Noted  . Pollen extract Cough 11/11/2012  . Latex Rash 04/16/2013  . Other Rash 08/22/2011    Past Medical History:  Diagnosis Date  . Abnormal Pap smear   . Headache(784.0)   . Hx of chlamydia infection   . Hypothyroidism   . Ovarian cyst, right   . Uterine fibroid     Past Surgical History:  Procedure Laterality Date  . COLPOSCOPY    . lipoma surgery      Family History  Problem Relation Age of Onset  . Diabetes Father   . Hypertension Father   . Other Son     reactive airway disease  . Asthma Son   . Heart murmur Son   . Cancer Maternal Grandmother     lung  . Heart disease Paternal Grandmother   . Liver disease Paternal Grandfather     etoh related  . Colon cancer Neg Hx     Social History   Social History  . Marital status: Married    Spouse name: N/A  . Number of children: N/A  . Years of education: N/A   Occupational History  . register  Social History Main Topics  . Smoking status: Never Smoker  . Smokeless tobacco: Never Used  . Alcohol use Yes     Comment: occ  . Drug use: No  . Sexual activity: Not Currently    Partners: Male    Birth control/ protection: IUD   Other Topics Concern  . Not on file   Social History Narrative  . No narrative on file      ROS:  General: Negative for anorexia, weight loss, fever, chills, fatigue, weakness. Eyes: Negative for vision changes.  ENT: Negative for hoarseness, difficulty swallowing , nasal congestion. CV: Negative for chest pain, angina, palpitations, dyspnea on exertion, peripheral edema.  Respiratory: Negative for  dyspnea at rest, dyspnea on exertion, cough, sputum, wheezing.  GI: See history of present illness. GU:  Negative for dysuria, hematuria, urinary incontinence, urinary frequency, nocturnal urination.  MS: Negative for joint pain, low back pain.  Derm: Negative for rash or itching.  Neuro: Negative for weakness, abnormal sensation, seizure, frequent headaches, memory loss, confusion.  Psych: Negative for anxiety, depression, suicidal ideation, hallucinations.  Endo: Negative for unusual weight change.  Heme: Negative for bruising or bleeding. Allergy: Negative for rash or hives.    Physical Examination:  BP 125/73   Pulse 73   Temp 98.2 F (36.8 C) (Oral)   Ht 5\' 6"  (1.676 m)   Wt 200 lb 9.6 oz (91 kg)   BMI 32.38 kg/m    General: Well-nourished, well-developed in no acute distress.  Head: Normocephalic, atraumatic.   Eyes: Conjunctiva pink, no icterus. Mouth: Oropharyngeal mucosa moist and pink , no lesions erythema or exudate. Neck: Supple without thyromegaly, masses, or lymphadenopathy.  Lungs: Clear to auscultation bilaterally.  Heart: Regular rate and rhythm, no murmurs rubs or gallops.  Abdomen: Bowel sounds are normal, Mild to moderate upper abdominal discomfort mostly midline, cannot reproduce right flank pain, nondistended, no hepatosplenomegaly or masses, no abdominal bruits, no rebound or guarding.   Rectal: Not performed Extremities: No lower extremity edema. No clubbing or deformities.  Neuro: Alert and oriented x 4 , grossly normal neurologically.  Skin: Warm and dry, no rash or jaundice.   Psych: Alert and cooperative, normal mood and affect.  Imaging Studies:  CLINICAL DATA:  Generalized abdominal pain for 4-5 weeks  EXAM: US ABDOMEN LIMITED - RIGHT UPPER QUADRANT  COMPARISON:  Abdominal CT 05/12/2016  FINDINGS: Gallbladder:  No gallstones or wall thickening visualized. No sonographic Murphy sign noted by sonographer.  Common bile  duct:  Diameter: 3 mm  Liver:  No focal lesion identified. Within normal limits in parenchymal echogenicity.  IMPRESSION: Normal right upper quadrant ultrasound.   Electronically Signed   By: Monte Fantasia M.D.   On: 09/29/2016 13:24

## 2016-11-08 NOTE — Patient Instructions (Signed)
Emily Morales  11/08/2016     @PREFPERIOPPHARMACY @   Your procedure is scheduled on 11/14/2016.  Report to [facility] at 1:00 PM  Call this number if you have problems the morning of surgery:  [phone]   Remember:  Do not eat food or drink liquids after midnight.  Take these medicines the morning of surgery with A SIP OF WATER :  Lexapro  and Flonase   Do not wear jewelry, make-up or nail polish.  Do not wear lotions, powders, or perfumes, or deoderant.  Do not shave 48 hours prior to surgery.  Men may shave face and neck.  Do not bring valuables to the hospital.  [facility] is not responsible for any belongings or valuables.  Contacts, dentures or bridgework may not be worn into surgery.  Leave your suitcase in the car.  After surgery it may be brought to your room.  For patients admitted to the hospital, discharge time will be determined by your treatment team.  Patients discharged the day of surgery will not be allowed to drive home.   Name and phone number of your driver:   family Special instructions:  n/a  Please read over the following fact sheets that you were given. Care and Recovery After Surgery    Esophagogastroduodenoscopy Esophagogastroduodenoscopy (EGD) is a procedure to examine the lining of the esophagus, stomach, and first part of the small intestine (duodenum). This procedure is done to check for problems such as inflammation, bleeding, ulcers, or growths. During this procedure, a long, flexible, lighted tube with a camera attached (endoscope) is inserted down the throat. Tell a health care provider about:  Any allergies you have.  All medicines you are taking, including vitamins, herbs, eye drops, creams, and over-the-counter medicines.  Any problems you or family members have had with anesthetic medicines.  Any blood disorders you have.  Any surgeries you have had.  Any medical conditions you have.  Whether you are pregnant or may  be pregnant. What are the risks? Generally, this is a safe procedure. However, problems may occur, including:  Infection.  Bleeding.  A tear (perforation) in the esophagus, stomach, or duodenum.  Trouble breathing.  Excessive sweating.  Spasms of the larynx.  A slowed heartbeat.  Low blood pressure. What happens before the procedure?  Follow instructions from your health care provider about eating or drinking restrictions.  Ask your health care provider about:  Changing or stopping your regular medicines. This is especially important if you are taking diabetes medicines or blood thinners.  Taking medicines such as aspirin and ibuprofen. These medicines can thin your blood. Do not take these medicines before your procedure if your health care provider instructs you not to.  Plan to have someone take you home after the procedure.  If you wear dentures, be ready to remove them before the procedure. What happens during the procedure?  To reduce your risk of infection, your health care team will wash or sanitize their hands.  An IV tube will be put in a vein in your hand or arm. You will get medicines and fluids through this tube.  You will be given one or more of the following:  A medicine to help you relax (sedative).  A medicine to numb the area (local anesthetic). This medicine may be sprayed into your throat. It will make you feel more comfortable and keep you from gagging or coughing during the procedure.  A medicine for pain.  A mouth guard  may be placed in your mouth to protect your teeth and to keep you from biting on the endoscope.  You will be asked to lie on your left side.  The endoscope will be lowered down your throat into your esophagus, stomach, and duodenum.  Air will be put into the endoscope. This will help your health care provider see better.  The lining of your esophagus, stomach, and duodenum will be examined.  Your health care provider  may:  Take a tissue sample so it can be looked at in a lab (biopsy).  Remove growths.  Remove objects (foreign bodies) that are stuck.  Treat any bleeding with medicines or other devices that stop tissue from bleeding.  Widen (dilate) or stretch narrowed areas of your esophagus and stomach.  The endoscope will be taken out. The procedure may vary among health care providers and hospitals. What happens after the procedure?  Your blood pressure, heart rate, breathing rate, and blood oxygen level will be monitored often until the medicines you were given have worn off.  Do not eat or drink anything until the numbing medicine has worn off and your gag reflex has returned. This information is not intended to replace advice given to you by your health care provider. Make sure you discuss any questions you have with your health care provider. Document Released: 11/17/2004 Document Revised: 12/23/2015 Document Reviewed: 06/10/2015 Elsevier Interactive Patient Education  2017 [employer].

## 2016-11-10 ENCOUNTER — Encounter (HOSPITAL_COMMUNITY)
Admission: RE | Admit: 2016-11-10 | Discharge: 2016-11-10 | Disposition: A | Discharge status: Home / Self Care | Payer: 59 | Source: Ambulatory Visit | Attending: Gastroenterology | Admitting: Gastroenterology

## 2016-11-10 ENCOUNTER — Encounter (HOSPITAL_COMMUNITY): Payer: Self-pay | Admitting: *Deleted

## 2016-11-10 DIAGNOSIS — Z01812 Encounter for preprocedural laboratory examination: Secondary | ICD-10-CM | POA: Diagnosis not present

## 2016-11-10 HISTORY — DX: Major depressive disorder, single episode, unspecified: F32.9

## 2016-11-10 HISTORY — DX: Anemia, unspecified: D64.9

## 2016-11-10 HISTORY — DX: Depression, unspecified: F32.A

## 2016-11-10 LAB — CBC
HEMATOCRIT: 35.4 % — AB (ref 36.0–46.0)
HEMOGLOBIN: 11.7 g/dL — AB (ref 12.0–15.0)
MCH: 27 pg (ref 26.0–34.0)
MCHC: 33.1 g/dL (ref 30.0–36.0)
MCV: 81.8 fL (ref 78.0–100.0)
Platelets: 206 10*3/uL (ref 150–400)
RBC: 4.33 MIL/uL (ref 3.87–5.11)
RDW: 14.6 % (ref 11.5–15.5)
WBC: 2.8 10*3/uL — AB (ref 4.0–10.5)

## 2016-11-10 LAB — BASIC METABOLIC PANEL
ANION GAP: 6 (ref 5–15)
BUN: 7 mg/dL (ref 6–20)
CO2: 28 mmol/L (ref 22–32)
CREATININE: 0.56 mg/dL (ref 0.44–1.00)
Calcium: 8.9 mg/dL (ref 8.9–10.3)
Chloride: 103 mmol/L (ref 101–111)
GFR calc non Af Amer: >60 mL/min (ref >60)
Glucose, Bld: 80 mg/dL (ref 65–99)
POTASSIUM: 3.8 mmol/L (ref 3.5–5.1)
Sodium: 137 mmol/L (ref 135–145)

## 2016-11-10 LAB — HCG, SERUM, QUALITATIVE: Preg, Serum: NEGATIVE

## 2016-11-13 NOTE — Progress Notes (Signed)
PT is aware.

## 2016-11-14 ENCOUNTER — Ambulatory Visit (HOSPITAL_COMMUNITY)
Admission: RE | Admit: 2016-11-14 | Discharge: 2016-11-14 | Disposition: A | Discharge status: Home / Self Care | Payer: 59 | Source: Ambulatory Visit | Attending: Gastroenterology | Admitting: Gastroenterology

## 2016-11-14 ENCOUNTER — Ambulatory Visit (HOSPITAL_COMMUNITY): Payer: 59 | Admitting: Anesthesiology

## 2016-11-14 ENCOUNTER — Encounter (HOSPITAL_COMMUNITY)
Admission: RE | Disposition: A | Discharge status: Home / Self Care | Payer: Self-pay | Source: Ambulatory Visit | Attending: Gastroenterology

## 2016-11-14 ENCOUNTER — Encounter (HOSPITAL_COMMUNITY): Payer: Self-pay | Admitting: *Deleted

## 2016-11-14 DIAGNOSIS — Z801 Family history of malignant neoplasm of trachea, bronchus and lung: Secondary | ICD-10-CM | POA: Diagnosis not present

## 2016-11-14 DIAGNOSIS — Z79899 Other long term (current) drug therapy: Secondary | ICD-10-CM | POA: Insufficient documentation

## 2016-11-14 DIAGNOSIS — E039 Hypothyroidism, unspecified: Secondary | ICD-10-CM | POA: Insufficient documentation

## 2016-11-14 DIAGNOSIS — Z9889 Other specified postprocedural states: Secondary | ICD-10-CM | POA: Diagnosis not present

## 2016-11-14 DIAGNOSIS — K2970 Gastritis, unspecified, without bleeding: Secondary | ICD-10-CM | POA: Diagnosis not present

## 2016-11-14 DIAGNOSIS — F329 Major depressive disorder, single episode, unspecified: Secondary | ICD-10-CM | POA: Diagnosis not present

## 2016-11-14 DIAGNOSIS — K219 Gastro-esophageal reflux disease without esophagitis: Secondary | ICD-10-CM | POA: Insufficient documentation

## 2016-11-14 DIAGNOSIS — Z833 Family history of diabetes mellitus: Secondary | ICD-10-CM | POA: Diagnosis not present

## 2016-11-14 DIAGNOSIS — Z7951 Long term (current) use of inhaled steroids: Secondary | ICD-10-CM | POA: Insufficient documentation

## 2016-11-14 DIAGNOSIS — R079 Chest pain, unspecified: Secondary | ICD-10-CM

## 2016-11-14 DIAGNOSIS — Z91048 Other nonmedicinal substance allergy status: Secondary | ICD-10-CM | POA: Insufficient documentation

## 2016-11-14 DIAGNOSIS — Z825 Family history of asthma and other chronic lower respiratory diseases: Secondary | ICD-10-CM | POA: Diagnosis not present

## 2016-11-14 DIAGNOSIS — Z8249 Family history of ischemic heart disease and other diseases of the circulatory system: Secondary | ICD-10-CM | POA: Insufficient documentation

## 2016-11-14 DIAGNOSIS — Z91040 Latex allergy status: Secondary | ICD-10-CM | POA: Insufficient documentation

## 2016-11-14 DIAGNOSIS — R1013 Epigastric pain: Secondary | ICD-10-CM

## 2016-11-14 DIAGNOSIS — K2980 Duodenitis without bleeding: Secondary | ICD-10-CM | POA: Insufficient documentation

## 2016-11-14 HISTORY — PX: ESOPHAGOGASTRODUODENOSCOPY (EGD) WITH PROPOFOL: SHX5813

## 2016-11-14 HISTORY — PX: BIOPSY: SHX5522

## 2016-11-14 SURGERY — ESOPHAGOGASTRODUODENOSCOPY (EGD) WITH PROPOFOL
Anesthesia: Monitor Anesthesia Care

## 2016-11-14 MED ORDER — SUCCINYLCHOLINE CHLORIDE 20 MG/ML IJ SOLN
INTRAMUSCULAR | Status: AC
  Filled 2016-11-14: qty 2

## 2016-11-14 MED ORDER — SIMETHICONE 40 MG/0.6ML PO SUSP
ORAL | Status: DC | PRN
  Administered 2016-11-14: 2.5 mL

## 2016-11-14 MED ORDER — LIDOCAINE HCL (CARDIAC) 10 MG/ML IV SOLN
INTRAVENOUS | Status: DC | PRN
  Administered 2016-11-14: 40 mg via INTRAVENOUS

## 2016-11-14 MED ORDER — CHLORHEXIDINE GLUCONATE CLOTH 2 % EX PADS: 6.0000 | MEDICATED_PAD | Freq: Once | CUTANEOUS | Status: DC

## 2016-11-14 MED ORDER — PROPOFOL 500 MG/50ML IV EMUL
INTRAVENOUS | Status: DC | PRN
  Administered 2016-11-14: 150 ug/kg/min via INTRAVENOUS

## 2016-11-14 MED ORDER — PROPOFOL 10 MG/ML IV BOLUS
INTRAVENOUS | Status: AC
  Filled 2016-11-14: qty 40

## 2016-11-14 MED ORDER — LIDOCAINE VISCOUS 2 % MT SOLN
OROMUCOSAL | Status: AC
  Filled 2016-11-14: qty 15

## 2016-11-14 MED ORDER — MIDAZOLAM HCL 2 MG/2ML IJ SOLN
INTRAMUSCULAR | Status: AC
  Filled 2016-11-14: qty 2

## 2016-11-14 MED ORDER — FENTANYL CITRATE (PF) 100 MCG/2ML IJ SOLN
INTRAMUSCULAR | Status: AC
  Filled 2016-11-14: qty 2

## 2016-11-14 MED ORDER — LACTATED RINGERS IV SOLN
INTRAVENOUS | Status: DC
  Administered 2016-11-14 (×2): via INTRAVENOUS

## 2016-11-14 MED ORDER — EPHEDRINE SULFATE 50 MG/ML IJ SOLN
INTRAMUSCULAR | Status: AC
  Filled 2016-11-14: qty 1

## 2016-11-14 MED ORDER — LIDOCAINE VISCOUS 2 % MT SOLN
5.0000 mL | Freq: Once | OROMUCOSAL | Status: AC
  Administered 2016-11-14: 5 mL via OROMUCOSAL

## 2016-11-14 MED ORDER — LIDOCAINE HCL (PF) 1 % IJ SOLN
INTRAMUSCULAR | Status: AC
  Filled 2016-11-14: qty 5

## 2016-11-14 MED ORDER — PROPOFOL 10 MG/ML IV BOLUS
INTRAVENOUS | Status: AC
  Filled 2016-11-14: qty 20

## 2016-11-14 MED ORDER — GLYCOPYRROLATE 0.2 MG/ML IJ SOLN
INTRAMUSCULAR | Status: DC | PRN
  Administered 2016-11-14: 0.2 mg via INTRAVENOUS

## 2016-11-14 MED ORDER — MIDAZOLAM HCL 2 MG/2ML IJ SOLN
1.0000 mg | INTRAMUSCULAR | Status: AC
  Administered 2016-11-14 (×2): 1 mg via INTRAVENOUS

## 2016-11-14 MED ORDER — FENTANYL CITRATE (PF) 100 MCG/2ML IJ SOLN
25.0000 ug | INTRAMUSCULAR | Status: AC
  Administered 2016-11-14: 25 ug via INTRAVENOUS

## 2016-11-14 NOTE — Anesthesia Procedure Notes (Signed)
Procedure Name: MAC Date/Time: 11/14/2016 11:38 AM Performed by: Andree Elk, Jaaron Oleson A Pre-anesthesia Checklist: Patient identified, Emergency Drugs available, Suction available and Patient being monitored Oxygen Delivery Method: Simple face mask

## 2016-11-14 NOTE — Transfer of Care (Signed)
Immediate Anesthesia Transfer of Care Note  Patient: Emily Morales  Procedure(s) Performed: Procedure(s) with comments: ESOPHAGOGASTRODUODENOSCOPY (EGD) WITH PROPOFOL (N/A) - 230  BIOPSY - duodenal gastric  Patient Location: PACU  Anesthesia Type:MAC  Level of Consciousness: awake, alert , oriented and patient cooperative  Airway & Oxygen Therapy: Patient Spontanous Breathing and Patient connected to nasal cannula oxygen  Post-op Assessment: Report given to RN and Post -op Vital signs reviewed and stable  Post vital signs: Reviewed and stable  Last Vitals:  Vitals:   11/14/16 1110 11/14/16 1215  BP:  (!) 101/57  Pulse:  74  Resp: (!) 22 20  Temp:  (P) 36.4 C    Last Pain:  Vitals:   11/14/16 1015  TempSrc: Oral         Complications: No apparent anesthesia complications

## 2016-11-14 NOTE — Addendum Note (Signed)
Addendum  created 11/14/16 1605 by Vista Deck, CRNA   Charge Capture section accepted

## 2016-11-14 NOTE — Anesthesia Postprocedure Evaluation (Signed)
Anesthesia Post Note  Patient: Emily Morales  Procedure(s) Performed: Procedure(s) (LRB): ESOPHAGOGASTRODUODENOSCOPY (EGD) WITH PROPOFOL (N/A) BIOPSY  Patient location during evaluation: PACU Anesthesia Type: MAC Level of consciousness: awake and alert and oriented Pain management: pain level controlled Vital Signs Assessment: post-procedure vital signs reviewed and stable Respiratory status: respiratory function stable and patient connected to nasal cannula oxygen Cardiovascular status: stable Postop Assessment: no signs of nausea or vomiting Anesthetic complications: no     Last Vitals:  Vitals:   11/14/16 1110 11/14/16 1215  BP:  (!) 101/57  Pulse:  74  Resp: (!) 22 20  Temp:  36.4 C    Last Pain:  Vitals:   11/14/16 1015  TempSrc: Oral                 [name] A

## 2016-11-14 NOTE — H&P (Signed)
Primary Care Physician:  Doree Albee, MD Primary Gastroenterologist:  Dr. Oneida Alar  Pre-Procedure History & Physical: HPI:  Emily Morales is a 26 y.o. female here for DYSPEPSIA.  Past Medical History:  Diagnosis Date  . Abnormal Pap smear   . Anemia   . Depression   . Headache(784.0)   . Hx of chlamydia infection   . Hypothyroidism   . Ovarian cyst, right   . Uterine fibroid     Past Surgical History:  Procedure Laterality Date  . COLPOSCOPY    . lipoma surgery      Prior to Admission medications   Medication Sig Start Date End Date Taking? Authorizing Provider  Cholecalciferol (VITAMIN D3) 5000 units TABS Take 1 tablet by mouth daily.   Yes Historical Provider, MD  escitalopram (LEXAPRO) 10 MG tablet Take 10 mg by mouth daily. 10/11/16  Yes Historical Provider, MD  fluticasone (FLONASE) 50 MCG/ACT nasal spray Place 2 sprays into both nostrils daily. 07/26/16  Yes Sharion Balloon, FNP  linaclotide [facility]) 290 MCG CAPS capsule Take 1 capsule (290 mcg total) by mouth daily before breakfast. 11/07/16  Yes Mahala Menghini, PA-C  Melatonin 5 MG TABS Take 1 tablet by mouth at bedtime as needed (sleep).    Yes Historical Provider, MD  pantoprazole (PROTONIX) 40 MG tablet Take 1 tablet (40 mg total) by mouth daily before breakfast. 11/07/16  Yes Mahala Menghini, PA-C  acetaminophen (TYLENOL) 325 MG tablet Take 2 tablets (650 mg total) by mouth every 6 (six) hours as needed. Patient not taking: Reported on 11/07/2016 05/12/16   Varney Biles, MD    Allergies as of 11/07/2016 - Review Complete 11/07/2016  Allergen Reaction Noted  . Pollen extract Cough 11/11/2012  . Latex Rash 04/16/2013  . Other Rash 08/22/2011    Family History  Problem Relation Age of Onset  . Diabetes Father   . Hypertension Father   . Other Son     reactive airway disease  . Asthma Son   . Heart murmur Son   . Cancer Maternal Grandmother     lung  . Heart disease Paternal Grandmother   .  Liver disease Paternal Grandfather     etoh related  . Colon cancer Neg Hx     Social History   Social History  . Marital status: Married    Spouse name: N/A  . Number of children: N/A  . Years of education: N/A   Occupational History  . register    Social History Main Topics  . Smoking status: Never Smoker  . Smokeless tobacco: Never Used  . Alcohol use Yes     Comment: occ  . Drug use: No  . Sexual activity: Not Currently    Partners: Male    Birth control/ protection: IUD   Other Topics Concern  . Not on file   Social History Narrative  . No narrative on file    Review of Systems: See HPI, otherwise negative ROS   Physical Exam: BP 115/77   Temp 98.2 F (36.8 C) (Oral)   Resp (!) 22   Ht 5\' 6"  (1.676 m)   Wt 203 lb (92.1 kg)   LMP 11/06/2016 (Exact Date)   SpO2 99%   BMI 32.77 kg/m  General:   Alert,  pleasant and cooperative in NAD Head:  Normocephalic and atraumatic. Neck:  Supple; Lungs:  Clear throughout to auscultation.    Heart:  Regular rate and rhythm. Abdomen:  Soft, nontender and nondistended.  Normal bowel sounds, without guarding, and without rebound.   Neurologic:  Alert and  oriented x4;  grossly normal neurologically.  Impression/Plan:     DYSPEPSIA  PLAN:  EGD TODAY. DISCUSSED PROCEDURE, BENEFITS, & RISKS: < 1% chance of medication reaction, bleeding, or perforation.

## 2016-11-14 NOTE — Op Note (Signed)
[facility] Patient Name: Emily Morales Procedure Date: 11/14/2016 11:19 AM MRN: [id number] Date of Birth: April 19, 1991 Attending MD: Barney Drain , MD CSN: [id number] Age: 26 Admit Type: Outpatient Procedure:                Upper GI endoscopy WITH COLD FORCEPS BIOPSY Indications:              Dyspepsia, Esophageal reflux symptoms that persist                            despite appropriate therapy. FAILED OMEPRAZOLE.                            WILL START PROTONIX TODAY. 5-6 LBS WEIGHT LOSS DUE                            TO CHEST/BACK PAIN. USES IBUPROFEN DAILY. LINZESS                            WORKING. Providers:                Barney Drain, MD, Lurline Del, RN, Rosina Lowenstein, RN Referring MD:             Doree Albee MD, MD Medicines:                Propofol per Anesthesia Complications:            No immediate complications. Estimated Blood Loss:     Estimated blood loss was minimal. Procedure:                Pre-Anesthesia Assessment:                           - Prior to the procedure, a History and Physical                            was performed, and patient medications and                            allergies were reviewed. The patient's tolerance of                            previous anesthesia was also reviewed. The risks                            and benefits of the procedure and the sedation                            options and risks were discussed with the patient.                            All questions were answered, and informed consent                            was obtained. Prior Anticoagulants: The patient has  taken ibuprofen, last dose was 1 day prior to                            procedure. ASA Grade Assessment: II - A patient                            with mild systemic disease. After reviewing the                            risks and benefits, the patient was deemed in                            satisfactory  condition to undergo the procedure.                            After obtaining informed consent, the endoscope was                            passed under direct vision. Throughout the                            procedure, the patient's blood pressure, pulse, and                            oxygen saturations were monitored continuously. The                            [id number] ([id number]) scope was introduced through the                            mouth, and advanced to the second part of duodenum.                            The upper GI endoscopy was technically difficult                            and complex due to the patient's agitation.                            Successful completion of the procedure was aided by                            increasing the dose of sedation medication,                            withdrawing and reinserting the scope and receiving                            assistance from additional staff. The patient                            tolerated the procedure fairly well. Scope In: 11:48:35 AM Scope Out: 12:02:53 PM Total Procedure Duration: 0 hours 14  minutes 18 seconds  Findings:      The examined esophagus was normal.      Patchy mild inflammation characterized by erythema was found in the       gastric antrum. Biopsies were taken with a cold forceps for Helicobacter       pylori testing.      The examined duodenum was normal. This was biopsied with a cold forceps       for histology. Impression:               - CHEST PAIN/EPIGASTRIC PAIN MOST LIKELY DUE TO                            UNCONTROLLED GERD                           - MILD Gastritis Moderate Sedation:      Per Anesthesia Care Recommendation:           - Await pathology results.                           - Use Protonix (pantoprazole) 40 mg PO daily 30                            MINS BEFORE FIRST MEAL.                           - Low fat diet. CONSIDER A PLANT BASED DIET.                            - Continue present medications. AVOID REFLUX                            TRIGGERS.                           - Return to my office in 4 months.                           - Patient has a contact number available for                            emergencies. The signs and symptoms of potential                            delayed complications were discussed with the                            patient. Return to normal activities tomorrow.                            Written discharge instructions were provided to the                            patient. Procedure Code(s):        --- Professional ---  72257, Esophagogastroduodenoscopy, flexible,                            transoral; with biopsy, single or multiple Diagnosis Code(s):        --- Professional ---                           K29.70, Gastritis, unspecified, without bleeding                           R10.13, Epigastric pain                           K21.9, Gastro-esophageal reflux disease without                            esophagitis CPT copyright 2016 American Medical Association. All rights reserved. The codes documented in this report are preliminary and upon coder review may  be revised to meet current compliance requirements. Barney Drain, MD Barney Drain, MD 11/14/2016 12:16:34 PM This report has been signed electronically. Number of Addenda: 0

## 2016-11-14 NOTE — Anesthesia Preprocedure Evaluation (Signed)
Anesthesia Evaluation  Patient identified by MRN, date of birth, ID band Patient awake    Reviewed: Allergy & Precautions, NPO status , Patient's Chart, lab work & pertinent test results  Airway Mallampati: II  TM Distance: >3 FB     Dental  (+) Teeth Intact   Pulmonary neg pulmonary ROS,    breath sounds clear to auscultation       Cardiovascular negative cardio ROS   Rhythm:Regular Rate:Normal     Neuro/Psych  Headaches, PSYCHIATRIC DISORDERS Depression    GI/Hepatic GERD  Medicated,  Endo/Other  Hypothyroidism   Renal/GU      Musculoskeletal   Abdominal   Peds  Hematology   Anesthesia Other Findings   Reproductive/Obstetrics                             Anesthesia Physical Anesthesia Plan  ASA: II  Anesthesia Plan: MAC   Post-op Pain Management:    Induction: Intravenous  Airway Management Planned: Simple Face Mask  Additional Equipment:   Intra-op Plan:   Post-operative Plan:   Informed Consent: I have reviewed the patients History and Physical, chart, labs and discussed the procedure including the risks, benefits and alternatives for the proposed anesthesia with the patient or authorized representative who has indicated his/her understanding and acceptance.     Plan Discussed with:   Anesthesia Plan Comments:         Anesthesia Quick Evaluation

## 2016-11-14 NOTE — Discharge Instructions (Signed)
Your symptoms are most likely due to uncontrolled reflux.  You have mild gastritis. I biopsied your stomach and small bowel   CONTINUE YOUR WEIGHT LOSS EFFORTS. YOU SHOULD TRANSITION TO A PLANT BASED DIET-NO MEAT OR DAIRY for 6 mos. AVOID ITEMS THAT CAUSE BLOATING & GAS. I RECOMMEND THE BOOK, "PREVENT AND REVERSE HEART DISEFOLLOW A LOW FAT DIET. SEE INFO BELOWASE". CALDWELL ESSELTYN JR., MD. PAGE 120-121 CLEARLY STATE THE RULES AND QUICK AND EASY RECIPES FOR BREAKFAST, LUNCH, AND DINNER ARE AFTER P 127. In the meantime, FOLLOW A LOW FAT DIET. SEE INFO BELOW  START PROTONIX. TAKE IT 30 minutes prior to meals once DAILY.  YOUR BIOPSY RESULTS WILL BE AVAILABLE IN MY CHART AFTER APR 20 AND MY OFFICE WILL CONTACT YOU IN 10-14 DAYS WITH YOUR RESULTS.   FOLLOW UP IN 4 MOS.    ENDOSCOPY Care After Read the instructions outlined below and refer to this sheet in the next week. These discharge instructions provide you with general information on caring for yourself after you leave the hospital. While your treatment has been planned according to the most current medical practices available, unavoidable complications occasionally occur. If you have any problems or questions after discharge, call DR. Severn Goddard, [phone].  ACTIVITY  You may resume your regular activity, but move at a slower pace for the next 24 hours.   Take frequent rest periods for the next 24 hours.   Walking will help get rid of the air and reduce the bloated feeling in your belly (abdomen).   No driving for 24 hours (because of the medicine (anesthesia) used during the test).   You may shower.   Do not sign any important legal documents or operate any machinery for 24 hours (because of the anesthesia used during the test).    NUTRITION  Drink plenty of fluids.   You may resume your normal diet as instructed by your doctor.   Begin with a light meal and progress to your normal diet. Heavy or fried foods are harder to  digest and may make you feel sick to your stomach (nauseated).   Avoid alcoholic beverages for 24 hours or as instructed.    MEDICATIONS  You may resume your normal medications.   WHAT YOU CAN EXPECT TODAY  Some feelings of bloating in the abdomen.   Passage of more gas than usual.   Spotting of blood in your stool or on the toilet paper  .  IF YOU HAD POLYPS REMOVED DURING THE ENDOSCOPY:  Eat a soft diet IF YOU HAVE NAUSEA, BLOATING, ABDOMINAL PAIN, OR VOMITING.    FINDING OUT THE RESULTS OF YOUR TEST Not all test results are available during your visit. DR. Oneida Alar WILL CALL YOU WITHIN 14 DAYS OF YOUR PROCEDUE WITH YOUR RESULTS. Do not assume everything is normal if you have not heard from DR. Nevaeh Korte, CALL HER OFFICE AT [phone].  SEEK IMMEDIATE MEDICAL ATTENTION AND CALL THE OFFICE: [phone] IF:  You have more than a spotting of blood in your stool.   Your belly is swollen (abdominal distention).   You are nauseated or vomiting.   You have a temperature over 101F.   You have abdominal pain or discomfort that is severe or gets worse throughout the day.   Lifestyle and home remedies TO CONTROL REFLUX  You may eliminate or reduce the frequency of heartburn by making the following lifestyle changes:  Control your weight. Being overweight is a major risk factor for heartburn and GERD. Excess  pounds put pressure on your abdomen, pushing up your stomach and causing acid to back up into your esophagus.   Eat smaller meals. 4 TO 6 MEALS A DAY. This reduces pressure on the lower esophageal sphincter, helping to prevent the valve from opening and acid from washing back into your esophagus.   Loosen your belt. Clothes that fit tightly around your waist put pressure on your abdomen and the lower esophageal sphincter.   Eliminate heartburn triggers. Everyone has specific triggers. Common triggers such as fatty or fried foods, spicy food, tomato sauce, carbonated  beverages, alcohol, chocolate, mint, garlic, onion, caffeine and nicotine may make heartburn worse.   Avoid stooping or bending. Tying your shoes is OK. Bending over for longer periods to weed your garden isn't, especially soon after eating.   Don't lie down after a meal. Wait at least three to four hours after eating before going to bed, and don't lie down right after eating.   Alternative medicine  Several home remedies exist for treating GERD, but they provide only temporary relief. They include drinking baking soda (sodium bicarbonate) added to water or drinking other fluids such as baking soda mixed with cream of tartar and water.  Although these liquids create temporary relief by neutralizing, washing away or buffering acids, eventually they aggravate the situation by adding gas and fluid to your stomach, increasing pressure and causing more acid reflux. Further, adding more sodium to your diet may increase your blood pressure and add stress to your heart, and excessive bicarbonate ingestion can alter the acid-base balance in your body.  Low-Fat Diet BREADS, CEREALS, PASTA, RICE, DRIED PEAS, AND BEANS These products are high in carbohydrates and most are low in fat. Therefore, they can be increased in the diet as substitutes for fatty foods. They too, however, contain calories and should not be eaten in excess. Cereals can be eaten for snacks as well as for breakfast.  Include foods that contain fiber (fruits, vegetables, whole grains, and legumes). Research shows that fiber may lower blood cholesterol levels, especially the water-soluble fiber found in fruits, vegetables, oat products, and legumes.  FRUITS AND VEGETABLES It is good to eat fruits and vegetables. Besides being sources of fiber, both are rich in vitamins and some minerals. They help you get the daily allowances of these nutrients. Fruits and vegetables can be used for snacks and desserts.  MEATS Limit lean meat, chicken,  [place], and fish to no more than 6 ounces per day.  Beef, Pork, and Lamb Use lean cuts of beef, pork, and lamb. Lean cuts include:  Extra-lean ground beef.  Arm roast.  Sirloin tip.  Center-cut ham.  Round steak.  Loin chops.  Rump roast.  Tenderloin.  Trim all fat off the outside of meats before cooking. It is not necessary to severely decrease the intake of red meat, but lean choices should be made. Lean meat is rich in protein and contains a highly absorbable form of iron. Premenopausal women, in particular, should avoid reducing lean red meat because this could increase the risk for low red blood cells (iron-deficiency anemia).  Chicken and [place] These are good sources of protein. The fat of poultry can be reduced by removing the skin and underlying fat layers before cooking. Chicken and [place] can be substituted for lean red meat in the diet. Poultry should not be fried or covered with high-fat sauces.  Fish and Shellfish Fish is a good source of protein. Shellfish contain cholesterol, but they usually are  low in saturated fatty acids. The preparation of fish is important. Like chicken and [place], they should not be fried or covered with high-fat sauces.  EGGS Egg whites contain no fat or cholesterol. They can be eaten often. Try 1 to 2 egg whites instead of whole eggs in recipes or use egg substitutes that do not contain yolk.  MILK AND DAIRY PRODUCTS Use skim or 1% milk instead of 2% or whole milk. Decrease whole milk, natural, and processed cheeses. Use nonfat or low-fat (2%) cottage cheese or low-fat cheeses made from vegetable oils. Choose nonfat or low-fat (1 to 2%) yogurt. Experiment with evaporated skim milk in recipes that call for heavy cream. Substitute low-fat yogurt or low-fat cottage cheese for sour cream in dips and salad dressings. Have at least 2 servings of low-fat dairy products, such as 2 glasses of skim (or 1%) milk each day to help get your daily calcium  intake.  FATS AND OILS Reduce the total intake of fats, especially saturated fat. Butterfat, lard, and beef fats are high in saturated fat and cholesterol. These should be avoided as much as possible. Vegetable fats do not contain cholesterol, but certain vegetable fats, such as coconut oil, palm oil, and palm kernel oil are very high in saturated fats. These should be limited. These fats are often used in bakery goods, processed foods, popcorn, oils, and nondairy creamers. Vegetable shortenings and some peanut butters contain hydrogenated oils, which are also saturated fats. Read the labels on these foods and check for saturated vegetable oils.  Unsaturated vegetable oils and fats do not raise blood cholesterol. However, they should be limited because they are fats and are high in calories. Total fat should still be limited to 30% of your daily caloric intake. Desirable liquid vegetable oils are corn oil, cottonseed oil, olive oil, canola oil, safflower oil, soybean oil, and sunflower oil. Peanut oil is not as good, but small amounts are acceptable. Buy a heart-healthy tub margarine that has no partially hydrogenated oils in the ingredients. Mayonnaise and salad dressings often are made from unsaturated fats, but they should also be limited because of their high calorie and fat content.  OTHER EATING TIPS Snacks  Most sweets should be limited as snacks. They tend to be rich in calories and fats, and their caloric content outweighs their nutritional value. Some good choices in snacks are graham crackers, melba toast, soda crackers, bagels (no egg), English muffins, fruits, and vegetables. These snacks are preferable to snack crackers, [place] fries, and chips. Popcorn should be air-popped or cooked in small amounts of liquid vegetable oil.  Desserts Eat fruit, low-fat yogurt, and fruit ices instead of pastries, cake, and cookies. Sherbet, angel food cake, gelatin dessert, frozen low-fat yogurt, or other  frozen products that do not contain saturated fat (pure fruit juice bars, frozen ice pops) are also acceptable.   COOKING METHODS Choose those methods that use little or no fat. They include: Poaching.  Braising.  Steaming.  Grilling.  Baking.  Stir-frying.  Broiling.  Microwaving.  Foods can be cooked in a nonstick pan without added fat, or use a nonfat cooking spray in regular cookware. Limit fried foods and avoid frying in saturated fat. Add moisture to lean meats by using water, broth, cooking wines, and other nonfat or low-fat sauces along with the cooking methods mentioned above. Soups and stews should be chilled after cooking. The fat that forms on top after a few hours in the refrigerator should be skimmed off. When  preparing meals, avoid using excess salt. Salt can contribute to raising blood pressure in some people . EATING AWAY FROM HOME Order entres, potatoes, and vegetables without sauces or butter. When meat exceeds the size of a deck of cards (3 to 4 ounces), the rest can be taken home for another meal.  Choose vegetable or fruit salads and ask for low-calorie salad dressings to be served on the side. Use dressings sparingly. Limit high-fat toppings, such as bacon, crumbled eggs, cheese, sunflower seeds, and olives. Ask for heart-healthy tub margarine instead of butter.

## 2016-11-17 ENCOUNTER — Encounter (HOSPITAL_COMMUNITY): Payer: Self-pay | Admitting: Gastroenterology

## 2016-11-17 NOTE — Progress Notes (Signed)
Patient scheduled for appointment.

## 2016-11-29 DIAGNOSIS — F339 Major depressive disorder, recurrent, unspecified: Secondary | ICD-10-CM | POA: Diagnosis not present

## 2016-11-29 DIAGNOSIS — K219 Gastro-esophageal reflux disease without esophagitis: Secondary | ICD-10-CM | POA: Diagnosis not present

## 2016-11-29 DIAGNOSIS — E039 Hypothyroidism, unspecified: Secondary | ICD-10-CM | POA: Diagnosis not present

## 2016-11-29 DIAGNOSIS — E559 Vitamin D deficiency, unspecified: Secondary | ICD-10-CM | POA: Diagnosis not present

## 2016-12-21 ENCOUNTER — Encounter (HOSPITAL_COMMUNITY): Payer: Self-pay

## 2016-12-21 ENCOUNTER — Emergency Department (HOSPITAL_COMMUNITY): Payer: 59

## 2016-12-21 ENCOUNTER — Emergency Department (HOSPITAL_COMMUNITY)
Admission: EM | Admit: 2016-12-21 | Discharge: 2016-12-21 | Disposition: A | Discharge status: Home / Self Care | Payer: 59 | Attending: Emergency Medicine | Admitting: Emergency Medicine

## 2016-12-21 DIAGNOSIS — Z79899 Other long term (current) drug therapy: Secondary | ICD-10-CM | POA: Diagnosis not present

## 2016-12-21 DIAGNOSIS — K219 Gastro-esophageal reflux disease without esophagitis: Secondary | ICD-10-CM | POA: Diagnosis not present

## 2016-12-21 DIAGNOSIS — R52 Pain, unspecified: Secondary | ICD-10-CM

## 2016-12-21 DIAGNOSIS — R079 Chest pain, unspecified: Secondary | ICD-10-CM | POA: Diagnosis not present

## 2016-12-21 DIAGNOSIS — E039 Hypothyroidism, unspecified: Secondary | ICD-10-CM | POA: Diagnosis not present

## 2016-12-21 DIAGNOSIS — R0789 Other chest pain: Secondary | ICD-10-CM | POA: Diagnosis not present

## 2016-12-21 LAB — CBC WITH DIFFERENTIAL/PLATELET
BASOS ABS: 0 10*3/uL (ref 0.0–0.1)
BASOS PCT: 0 %
Eosinophils Absolute: 0 10*3/uL (ref 0.0–0.7)
Eosinophils Relative: 1 %
HEMATOCRIT: 37 % (ref 36.0–46.0)
Hemoglobin: 12.1 g/dL (ref 12.0–15.0)
Lymphocytes Relative: 46 %
Lymphs Abs: 2.3 10*3/uL (ref 0.7–4.0)
MCH: 27 pg (ref 26.0–34.0)
MCHC: 32.7 g/dL (ref 30.0–36.0)
MCV: 82.6 fL (ref 78.0–100.0)
MONO ABS: 0.3 10*3/uL (ref 0.1–1.0)
MONOS PCT: 6 %
NEUTROS ABS: 2.4 10*3/uL (ref 1.7–7.7)
NEUTROS PCT: 47 %
Platelets: 296 10*3/uL (ref 150–400)
RBC: 4.48 MIL/uL (ref 3.87–5.11)
RDW: 14.8 % (ref 11.5–15.5)
WBC: 5 10*3/uL (ref 4.0–10.5)

## 2016-12-21 LAB — BASIC METABOLIC PANEL
Anion gap: 9 (ref 5–15)
BUN: 13 mg/dL (ref 6–20)
CALCIUM: 9.6 mg/dL (ref 8.9–10.3)
CO2: 26 mmol/L (ref 22–32)
Chloride: 101 mmol/L (ref 101–111)
Creatinine, Ser: 0.68 mg/dL (ref 0.44–1.00)
GFR calc Af Amer: >60 mL/min (ref >60)
GLUCOSE: 80 mg/dL (ref 65–99)
Potassium: 4.2 mmol/L (ref 3.5–5.1)
SODIUM: 136 mmol/L (ref 135–145)

## 2016-12-21 LAB — I-STAT TROPONIN, ED: TROPONIN I, POC: 0 ng/mL (ref 0.00–0.08)

## 2016-12-21 MED ORDER — SUCRALFATE 1 G PO TABS: 1.0000 g | ORAL_TABLET | Freq: Three times a day (TID) | ORAL | 0 refills | Status: DC

## 2016-12-21 MED ORDER — TECHNETIUM TC 99M MEBROFENIN IV KIT
5.0000 | PACK | Freq: Once | INTRAVENOUS | Status: AC | PRN
  Administered 2016-12-21: 5 via INTRAVENOUS

## 2016-12-21 NOTE — ED Notes (Signed)
Pt being transported for scan

## 2016-12-21 NOTE — ED Notes (Signed)
Pt sill in [place].

## 2016-12-21 NOTE — ED Provider Notes (Signed)
[facility] [place] DEPT Provider Note   CSN: [id number] Arrival date & time: 12/21/16  2703  By signing my name below, I, Neta Mends, attest that this documentation has been prepared under the direction and in the presence of Daleen Bo, MD . Electronically Signed: Neta Mends, ED Scribe. 12/21/2016. 3:51 PM.    History   Chief Complaint Chief Complaint  Patient presents with  . Chest Pain   The history is provided by the patient. No language interpreter was used.   HPI Comments:  Emily Morales is a 26 y.o. female who presents to the Emergency Department complaining of intermittent chest pain since she woke up this AM. She states that the pain radiates to her back and up to her jaw, and she describes the pain as "spasms" that last for several minutes. She reports that she has been having this pain 2x a week for ~9 months. Pt had an endoscopy 1 month ago and was diagnosed with a small bowel inflammation. Pt recently changed from Prilosec to Protonix and states that her symptoms are worse after changing medications. Pt is not a smoker. No alleviating factors noted. Pt denies other associated symptoms.   Past Medical History:  Diagnosis Date  . Abnormal Pap smear   . Anemia   . Depression   . Headache(784.0)   . Hx of chlamydia infection   . Hypothyroidism   . Ovarian cyst, right   . Uterine fibroid     Patient Active Problem List   Diagnosis Date Noted  . Dyspepsia   . Right flank pain 11/07/2016  . Anemia 11/07/2016  . GERD (gastroesophageal reflux disease) 11/07/2016  . Constipation 11/07/2016  . Encounter for IUD insertion liletta 12/10/2015  . Mild postpartum depression 11/17/2015  . Umbilical hernia without obstruction and without gangrene 09/08/2015  . Oligouria 08/17/2015  . Mild dysplasia of cervix 03/26/2014    Past Surgical History:  Procedure Laterality Date  . BIOPSY  11/14/2016   Procedure: BIOPSY;  Surgeon: Danie Binder, MD;   Location: [facility];  Service: Endoscopy;;  duodenal gastric  . COLPOSCOPY    . ESOPHAGOGASTRODUODENOSCOPY (EGD) WITH PROPOFOL N/A 11/14/2016   Procedure: ESOPHAGOGASTRODUODENOSCOPY (EGD) WITH PROPOFOL;  Surgeon: Danie Binder, MD;  Location: [facility] END[facility] SUITE;  Service: Endoscopy;  Laterality: N/A;  230   . lipoma surgery      OB History    Gravida Para Term Preterm AB Living   3 2 2  0 0 2   SAB TAB Ectopic Multiple Live Births   0 0 0 0 2       Home Medications    Prior to Admission medications   Medication Sig Start Date End Date Taking? Authorizing Provider  acetaminophen (TYLENOL) 325 MG tablet Take 2 tablets (650 mg total) by mouth every 6 (six) hours as needed. 05/12/16  Yes Varney Biles, MD  Cholecalciferol (VITAMIN D3) 5000 units TABS Take 1 tablet by mouth daily.   Yes [provider]  escitalopram (LEXAPRO) 10 MG tablet Take 10 mg by mouth daily. 10/11/16  Yes [provider]  fluticasone (FLONASE) 50 MCG/ACT nasal spray Place 2 sprays into both nostrils daily. 07/26/16  Yes Hawks, Christy A, FNP  levothyroxine (SYNTHROID, LEVOTHROID) 25 MCG tablet Take 1 tablet by mouth daily. 11/29/16  Yes [provider]  pantoprazole (PROTONIX) 40 MG tablet Take 1 tablet (40 mg total) by mouth daily before breakfast. 11/07/16  Yes Mahala Menghini, PA-C  sucralfate (Boaz) 1  g tablet Take 1 tablet (1 g total) by mouth 4 (four) times daily -  with meals and at bedtime. 12/21/16   Daleen Bo, MD    Family History Family History  Problem Relation Age of Onset  . Diabetes Father   . Hypertension Father   . Other Son        reactive airway disease  . Asthma Son   . Heart murmur Son   . Cancer Maternal Grandmother        lung  . Heart disease Paternal Grandmother   . Liver disease Paternal Grandfather        etoh related  . Colon cancer Neg Hx     Social History Social History  Substance Use Topics  . Smoking status: Never Smoker  . Smokeless  tobacco: Never Used  . Alcohol use Yes     Comment: occ     Allergies   Pollen extract; Latex; and Other   Review of Systems Review of Systems  Cardiovascular: Positive for chest pain.  Musculoskeletal: Positive for back pain.  All other systems reviewed and are negative.    Physical Exam Updated Vital Signs BP 130/81   Pulse 78   Temp 97.8 F (36.6 C) (Oral)   Resp 16   Ht 5\' 5"  (1.651 m)   Wt 93.4 kg (206 lb)   SpO2 100%   BMI 34.28 kg/m   Physical Exam  Constitutional: She appears well-developed and well-nourished. No distress.  HENT:  Head: Normocephalic and atraumatic.  Neck: Neck supple.  Cardiovascular: Normal rate and regular rhythm.   Pulmonary/Chest: Effort normal and breath sounds normal. No respiratory distress. She has no wheezes. She has no rales.  Abdominal: Soft. She exhibits no distension. There is no tenderness. There is no rebound and no guarding.  Neurological: She is alert.  Skin: She is not diaphoretic.  Nursing note and vitals reviewed.    ED Treatments / Results  DIAGNOSTIC STUDIES:  Oxygen Saturation is 100% on RA, normal by my interpretation.    COORDINATION OF CARE:  9:17 AM Discussed treatment plan with pt at bedside and pt agreed to plan.   Labs (all labs ordered are listed, but only abnormal results are displayed) Labs Reviewed  BASIC METABOLIC PANEL  CBC WITH DIFFERENTIAL/PLATELET  Randolm Idol, ED    EKG  EKG Interpretation  Date/Time:  Thursday Dec 21 2016 09:14:11 EDT Ventricular Rate:  71 PR Interval:    QRS Duration: 103 QT Interval:  403 QTC Calculation: 438 R Axis:   22 Text Interpretation:  Sinus rhythm Low voltage, precordial leads since last tracing no significant change Confirmed by Daleen Bo [phone]) on 12/21/2016 10:26:21 AM       Radiology Dg Chest 2 View  Result Date: 12/21/2016 CLINICAL DATA:  Chest pain. EXAM: CHEST  2 VIEW COMPARISON:  Radiographs of January 03, 2016. FINDINGS: The  heart size and mediastinal contours are within normal limits. Both lungs are clear. No pneumothorax or pleural effusion is noted. The visualized skeletal structures are unremarkable. IMPRESSION: No active cardiopulmonary disease. Electronically Signed   By: Marijo Conception, M.D.   On: 12/21/2016 10:02   Nm Hepatobiliary Liver Func  Result Date: 12/21/2016 CLINICAL DATA:  Pain radiating from chest tear back, GERD, epigastric pain, recent endoscopy EXAM: NUCLEAR MEDICINE HEPATOBILIARY IMAGING WITH GALLBLADDER EF TECHNIQUE: Sequential images of the abdomen were obtained out to 60 minutes following intravenous administration of radiopharmaceutical. After oral ingestion of Ensure, gallbladder ejection fraction was  determined. At 60 min, normal ejection fraction is greater than 33%. RADIOPHARMACEUTICALS:  5 mCi Tc-67m  Choletec IV COMPARISON:  None FINDINGS: Normal tracer extraction from bloodstream indicating normal hepatocellular function. Normal excretion of tracer into biliary tree. Gallbladder visualized at 14 min. Small bowel not visualized until following fatty meal stimulation. No hepatic retention of tracer. Subjectively decreased emptying of tracer from gallbladder following fatty meal stimulation. Calculated gallbladder ejection fraction is 28%, mildly decreased. Patient reported no symptoms following Ensure ingestion. Normal gallbladder ejection fraction following Ensure ingestion is greater than 33% at 1 hour. IMPRESSION: Patent biliary tree. Mildly decreased gallbladder ejection fraction of 28% following fatty meal stimulation. Electronically Signed   By: Lavonia Dana M.D.   On: 12/21/2016 15:35    Procedures Procedures (including critical care time)  Medications Ordered in ED Medications  technetium TC 16M mebrofenin (CHOLETEC) injection 5 millicurie (5 millicuries Intravenous Contrast Given 12/21/16 1230)     Initial Impression / Assessment and Plan / ED Course  I have reviewed the triage  vital signs and the nursing notes.  Pertinent labs & imaging results that were available during my care of the patient were reviewed by me and considered in my medical decision making (see chart for details).  Clinical Course as of Dec 21 1549  Thu Dec 21, 2016  3299 Case discussed with the patient's primary care provider who she talked to and he recommended her to come here, this morning.  He was concerned about the new development of jaw pain therefore asked her to present to the ED for evaluation.  He states that her prior workup has been negative.  He agrees with starting a secondary treatment for reflux disease.  [EW]    Clinical Course User Index [EW] Daleen Bo, MD     Patient Vitals for the past 24 hrs:  BP Temp Temp src Pulse Resp SpO2 Height Weight  12/21/16 1500 130/81 - - 78 16 100 % - -  12/21/16 1200 (!) 128/93 - - 64 (!) 25 100 % - -  12/21/16 1130 118/74 - - 63 (!) 22 99 % - -  12/21/16 1100 128/88 - - 61 (!) 21 100 % - -  12/21/16 1030 122/82 - - 74 12 100 % - -  12/21/16 0930 125/74 - - 64 13 99 % - -  12/21/16 0915 118/72 97.8 F (36.6 C) Oral 68 18 99 % - -  12/21/16 0909 - - - - - - 5\' 5"  (1.651 m) 93.4 kg (206 lb)    3:48 PM Reevaluation with update and discussion. After initial assessment and treatment, an updated evaluation reveals patient remains comfortable.  Findings discussed with the patient and all questions answered. Emily Morales    Final Clinical Impressions(s) / ED Diagnoses   Final diagnoses:  Pain  Gastroesophageal reflux disease, esophagitis presence not specified    Evaluation is consistent with GERD causing mid chest and pain above the clavicles.  Postprandial abdominal discomfort possibly related to decreased gallbladder ejection fraction.  Doubt cholecystitis, serious bacterial infection, ACS, metabolic instability or impending vascular collapse.  Nursing Notes Reviewed/ Care Coordinated Applicable Imaging Reviewed Interpretation  of Laboratory Data incorporated into ED treatment  The patient appears reasonably screened and/or stabilized for discharge and I doubt any other medical condition or other [facility] requiring further screening, evaluation, or treatment in the ED at this time prior to discharge.  Plan: Home Medications-continue usual medications; Home Treatments-rest, fluids; return here if the recommended treatment, does  not improve the symptoms; Recommended follow up-PCP, and GI, 1 week for checkup.    New Prescriptions New Prescriptions   SUCRALFATE (CARAFATE) 1 G TABLET    Take 1 tablet (1 g total) by mouth 4 (four) times daily -  with meals and at bedtime.  I personally performed the services described in this documentation, which was scribed in my presence. The recorded information has been reviewed and is accurate.     Daleen Bo, MD 12/21/16 [phone]

## 2016-12-21 NOTE — ED Notes (Signed)
ED Provider at bedside. 

## 2016-12-21 NOTE — ED Notes (Signed)
Pt updated not to eat or drink that scan will be done once medication arrives.

## 2016-12-21 NOTE — ED Triage Notes (Signed)
Pt reports chest pain that woke her up this morning.  Reports pain radiates up to jaws and through her back.  Reports pain comes and goes.  Has been treated for acid reflux.  Pt recently had endoscopy.   Pt also says her pcp is monitoring her WBC count because it was low.

## 2016-12-21 NOTE — ED Notes (Signed)
Pt continues to be in [place].

## 2016-12-21 NOTE — ED Notes (Signed)
Pt back from [place]. Made aware that we were now waiting for the results of the function test.

## 2017-01-10 DIAGNOSIS — Z0000 Encounter for general adult medical examination without abnormal findings: Secondary | ICD-10-CM | POA: Diagnosis not present

## 2017-01-10 DIAGNOSIS — E669 Obesity, unspecified: Secondary | ICD-10-CM | POA: Diagnosis not present

## 2017-01-10 DIAGNOSIS — K219 Gastro-esophageal reflux disease without esophagitis: Secondary | ICD-10-CM | POA: Diagnosis not present

## 2017-01-10 DIAGNOSIS — R5383 Other fatigue: Secondary | ICD-10-CM | POA: Diagnosis not present

## 2017-01-10 DIAGNOSIS — F339 Major depressive disorder, recurrent, unspecified: Secondary | ICD-10-CM | POA: Diagnosis not present

## 2017-01-10 DIAGNOSIS — E039 Hypothyroidism, unspecified: Secondary | ICD-10-CM | POA: Diagnosis not present

## 2017-01-10 DIAGNOSIS — E559 Vitamin D deficiency, unspecified: Secondary | ICD-10-CM | POA: Diagnosis not present

## 2017-01-22 ENCOUNTER — Encounter: Payer: Self-pay | Admitting: Obstetrics & Gynecology

## 2017-01-24 ENCOUNTER — Ambulatory Visit (INDEPENDENT_AMBULATORY_CARE_PROVIDER_SITE_OTHER): Payer: 59 | Admitting: Advanced Practice Midwife

## 2017-01-24 ENCOUNTER — Encounter: Payer: Self-pay | Admitting: Advanced Practice Midwife

## 2017-01-24 VITALS — BP 80/60 | HR 80 | Wt 202.0 lb

## 2017-01-24 DIAGNOSIS — Z713 Dietary counseling and surveillance: Secondary | ICD-10-CM | POA: Diagnosis not present

## 2017-01-24 DIAGNOSIS — E559 Vitamin D deficiency, unspecified: Secondary | ICD-10-CM | POA: Diagnosis not present

## 2017-01-24 DIAGNOSIS — Z30432 Encounter for removal of intrauterine contraceptive device: Secondary | ICD-10-CM | POA: Diagnosis not present

## 2017-01-24 DIAGNOSIS — Z3202 Encounter for pregnancy test, result negative: Secondary | ICD-10-CM | POA: Diagnosis not present

## 2017-01-24 DIAGNOSIS — E039 Hypothyroidism, unspecified: Secondary | ICD-10-CM | POA: Diagnosis not present

## 2017-01-24 DIAGNOSIS — E669 Obesity, unspecified: Secondary | ICD-10-CM | POA: Diagnosis not present

## 2017-01-24 DIAGNOSIS — D649 Anemia, unspecified: Secondary | ICD-10-CM | POA: Diagnosis not present

## 2017-01-24 LAB — POCT URINE PREGNANCY: PREG TEST UR: NEGATIVE

## 2017-01-24 MED ORDER — LEVONORGEST-ETH ESTRAD 91-DAY 0.15-0.03 MG PO TABS: 1.0000 | ORAL_TABLET | Freq: Every day | ORAL | 4 refills | Status: DC

## 2017-01-24 NOTE — Progress Notes (Signed)
  Emily Morales 26 y.o.  Vitals:   01/24/17 0953  BP: (!) 80/60  Pulse: 80   Past Medical History:  Diagnosis Date  . Abnormal Pap smear   . Anemia   . Depression   . Headache(784.0)   . Hx of chlamydia infection   . Hypothyroidism   . Ovarian cyst, right   . Uterine fibroid    Past Surgical History:  Procedure Laterality Date  . BIOPSY  11/14/2016   Procedure: BIOPSY;  Surgeon: Emily Binder, MD;  Location: [facility];  Service: Endoscopy;;  duodenal gastric  . COLPOSCOPY    . ESOPHAGOGASTRODUODENOSCOPY (EGD) WITH PROPOFOL N/A 11/14/2016   Procedure: ESOPHAGOGASTRODUODENOSCOPY (EGD) WITH PROPOFOL;  Surgeon: Emily Binder, MD;  Location: [facility];  Service: Endoscopy;  Laterality: N/A;  230   . lipoma surgery     family history includes Asthma in her son; Cancer in her maternal grandmother; Diabetes in her father; Heart disease in her paternal grandmother; Heart murmur in her son; Hypertension in her father; Liver disease in her paternal grandfather; Other in her son.  Current Outpatient Prescriptions:  .  Cholecalciferol (VITAMIN D3) 5000 units TABS, Take 1 tablet by mouth daily., Disp: , Rfl:  .  escitalopram (LEXAPRO) 10 MG tablet, Take 10 mg by mouth daily., Disp: , Rfl: 3 .  levothyroxine (SYNTHROID, LEVOTHROID) 25 MCG tablet, Take 1 tablet by mouth daily., Disp: , Rfl: 2 .  acetaminophen (TYLENOL) 325 MG tablet, Take 2 tablets (650 mg total) by mouth every 6 (six) hours as needed. (Patient not taking: Reported on 01/24/2017), Disp: 30 tablet, Rfl: 0 .  fluticasone (FLONASE) 50 MCG/ACT nasal spray, Place 2 sprays into both nostrils daily. (Patient not taking: Reported on 01/24/2017), Disp: 16 g, Rfl: 6 .  pantoprazole (PROTONIX) 40 MG tablet, Take 1 tablet (40 mg total) by mouth daily before breakfast. (Patient not taking: Reported on 01/24/2017), Disp: 30 tablet, Rfl: 11 .  sucralfate (CARAFATE) 1 g tablet, Take 1 tablet (1 g total) by mouth 4 (four) times  daily -  with meals and at bedtime. (Patient not taking: Reported on 01/24/2017), Disp: 120 tablet, Rfl: 0    Here for IUD removal.  She had the Emily Morales IUD placed 1 years ago and would like it removed because she cramps a lot. Her plans for future contraception are sesasonale.  A graves speculum was placed, and the strings were visible.  They were grasped with a curved Emily Morales and the IUD easily removed.  Pt given IUD removal f/u instructions.  Start COCs now, BU for 1 mo nth

## 2017-02-14 DIAGNOSIS — E039 Hypothyroidism, unspecified: Secondary | ICD-10-CM | POA: Diagnosis not present

## 2017-02-14 DIAGNOSIS — D649 Anemia, unspecified: Secondary | ICD-10-CM | POA: Diagnosis not present

## 2017-02-14 DIAGNOSIS — K219 Gastro-esophageal reflux disease without esophagitis: Secondary | ICD-10-CM | POA: Diagnosis not present

## 2017-02-14 DIAGNOSIS — E559 Vitamin D deficiency, unspecified: Secondary | ICD-10-CM | POA: Diagnosis not present

## 2017-02-15 ENCOUNTER — Telehealth: Payer: 59 | Admitting: Family

## 2017-02-15 DIAGNOSIS — B373 Candidiasis of vulva and vagina: Secondary | ICD-10-CM | POA: Diagnosis not present

## 2017-02-15 DIAGNOSIS — B3731 Acute candidiasis of vulva and vagina: Secondary | ICD-10-CM

## 2017-02-15 MED ORDER — FLUCONAZOLE 150 MG PO TABS: 150.0000 mg | ORAL_TABLET | Freq: Once | ORAL | 0 refills | Status: AC

## 2017-02-15 NOTE — Progress Notes (Signed)
Thank you for the details you put in the comment boxes. Those details really help us take better care of you.   We are sorry that you are not feeling well. Here is how we plan to help! Based on what you shared with me it looks like you: May have a yeast vaginosis  Vaginosis is an inflammation of the vagina that can result in discharge, itching and pain. The cause is usually a change in the normal balance of vaginal bacteria or an infection. Vaginosis can also result from reduced estrogen levels after menopause.  The most common causes of vaginosis are:   Bacterial vaginosis which results from an overgrowth of one on several organisms that are normally present in your vagina.   Yeast infections which are caused by a naturally occurring fungus called candida.   Vaginal atrophy (atrophic vaginosis) which results from the thinning of the vagina from reduced estrogen levels after menopause.   Trichomoniasis which is caused by a parasite and is commonly transmitted by sexual intercourse.  Factors that increase your risk of developing vaginosis include: . Medications, such as antibiotics and steroids . Uncontrolled diabetes . Use of hygiene products such as bubble bath, vaginal spray or vaginal deodorant . Douching . Wearing damp or tight-fitting clothing . Using an intrauterine device (IUD) for birth control . Hormonal changes, such as those associated with pregnancy, birth control pills or menopause . Sexual activity . Having a sexually transmitted infection  Your treatment plan is A single Diflucan (fluconazole) 150mg tablet once.  I have electronically sent this prescription into the pharmacy that you have chosen.  Be sure to take all of the medication as directed. Stop taking any medication if you develop a rash, tongue swelling or shortness of breath. Mothers who are breast feeding should consider pumping and discarding their breast milk while on these antibiotics. However, there is no  consensus that infant exposure at these doses would be harmful.  Remember that medication creams can weaken latex condoms. .   HOME CARE:  Good hygiene may prevent some types of vaginosis from recurring and may relieve some symptoms:  . Avoid baths, hot tubs and whirlpool spas. Rinse soap from your outer genital area after a shower, and dry the area well to prevent irritation. Don't use scented or harsh soaps, such as those with deodorant or antibacterial action. . Avoid irritants. These include scented tampons and pads. . Wipe from front to back after using the toilet. Doing so avoids spreading fecal bacteria to your vagina.  Other things that may help prevent vaginosis include:  . Don't douche. Your vagina doesn't require cleansing other than normal bathing. Repetitive douching disrupts the normal organisms that reside in the vagina and can actually increase your risk of vaginal infection. Douching won't clear up a vaginal infection. . Use a latex condom. Both female and female latex condoms may help you avoid infections spread by sexual contact. . Wear cotton underwear. Also wear pantyhose with a cotton crotch. If you feel comfortable without it, skip wearing underwear to bed. Yeast thrives in moist environments Your symptoms should improve in the next day or two.  GET HELP RIGHT AWAY IF:  . You have pain in your lower abdomen ( pelvic area or over your ovaries) . You develop nausea or vomiting . You develop a fever . Your discharge changes or worsens . You have persistent pain with intercourse . You develop shortness of breath, a rapid pulse, or you faint.  These symptoms   could be signs of problems or infections that need to be evaluated by a medical provider now.  MAKE SURE YOU    Understand these instructions.  Will watch your condition.  Will get help right away if you are not doing well or get worse.  Your e-visit answers were reviewed by a board certified advanced  clinical practitioner to complete your personal care plan. Depending upon the condition, your plan could have included both over the counter or prescription medications. Please review your pharmacy choice to make sure that you have choses a pharmacy that is open for you to pick up any needed prescription, Your safety is important to us. If you have drug allergies check your prescription carefully.   You can use MyChart to ask questions about today's visit, request a non-urgent call back, or ask for a work or school excuse for 24 hours related to this e-Visit. If it has been greater than 24 hours you will need to follow up with your provider, or enter a new e-Visit to address those concerns. You will get a MyChart message within the next two days asking about your experience. I hope that your e-visit has been valuable and will speed your recovery.  

## 2017-02-17 ENCOUNTER — Telehealth: Payer: 59 | Admitting: Family

## 2017-02-17 DIAGNOSIS — M545 Low back pain: Secondary | ICD-10-CM | POA: Diagnosis not present

## 2017-02-17 MED ORDER — CYCLOBENZAPRINE HCL 10 MG PO TABS
10.0000 mg | ORAL_TABLET | Freq: Three times a day (TID) | ORAL | 0 refills | Status: DC | PRN
Start: 2017-02-17 — End: 2018-02-07

## 2017-02-17 NOTE — Progress Notes (Signed)
Thank you for the details you put in the comment boxes. Those details really help [place] take better care of you.   We are sorry that you are not feeling well.  Here is how we plan to help!  Based on what you have shared with me it looks like you mostly have acute back pain.  Acute back pain is defined as musculoskeletal pain that can resolve in 1-3 weeks with conservative treatment.  I have prescribed Naprosyn 500 mg twice a day over the counter) non-steroid anti-inflammatory (NSAID) as well as Flexeril 10 mg every eight hours as needed which is a muscle relaxer  Some patients experience stomach irritation or in increased heartburn with anti-inflammatory drugs.  Please keep in mind that muscle relaxer's can cause fatigue and should not be taken while at work or driving.  Back pain is very common.  The pain often gets better over time.  The cause of back pain is usually not dangerous.  Most people can learn to manage their back pain on their own.  Home Care  Stay active.  Start with short walks on flat ground if you can.  Try to walk farther each day.  Do not sit, drive or stand in one place for more than 30 minutes.  Do not stay in bed.  Do not avoid exercise or work.  Activity can help your back heal faster.  Be careful when you bend or lift an object.  Bend at your knees, keep the object close to you, and do not twist.  Sleep on a firm mattress.  Lie on your side, and bend your knees.  If you lie on your back, put a pillow under your knees.  Only take medicines as told by your doctor.  Put ice on the injured area.  Put ice in a plastic bag  Place a towel between your skin and the bag  Leave the ice on for 15-20 minutes, 3-4 times a day for the first 2-3 days. 210 After that, you can switch between ice and heat packs.  Ask your doctor about back exercises or massage.  Avoid feeling anxious or stressed.  Find good ways to deal with stress, such as exercise.  Get Help Right Way  If:  Your pain does not go away with rest or medicine.  Your pain does not go away in 1 week.  You have new problems.  You do not feel well.  The pain spreads into your legs.  You cannot control when you poop (bowel movement) or pee (urinate)  You feel sick to your stomach (nauseous) or throw up (vomit)  You have belly (abdominal) pain.  You feel like you may pass out (faint).  If you develop a fever.  Make Sure you:  Understand these instructions.  Will watch your condition  Will get help right away if you are not doing well or get worse.  Your e-visit answers were reviewed by a board certified advanced clinical practitioner to complete your personal care plan.  Depending on the condition, your plan could have included both over the counter or prescription medications.  If there is a problem please reply  once you have received a response from your provider.  Your safety is important to [place].  If you have drug allergies check your prescription carefully.    You can use MyChart to ask questions about today's visit, request a non-urgent call back, or ask for a work or school excuse for 24 hours related to  this e-Visit. If it has been greater than 24 hours you will need to follow up with your provider, or enter a new e-Visit to address those concerns.  You will get an e-mail in the next two days asking about your experience.  I hope that your e-visit has been valuable and will speed your recovery. Thank you for using e-visits.   

## 2017-02-19 ENCOUNTER — Encounter: Payer: Self-pay | Admitting: Obstetrics & Gynecology

## 2017-02-22 ENCOUNTER — Ambulatory Visit (INDEPENDENT_AMBULATORY_CARE_PROVIDER_SITE_OTHER): Payer: 59 | Admitting: General Surgery

## 2017-02-22 ENCOUNTER — Encounter: Payer: Self-pay | Admitting: General Surgery

## 2017-02-22 VITALS — BP 118/78 | HR 72 | Temp 98.2°F | Resp 18 | Ht 66.0 in | Wt 206.0 lb

## 2017-02-22 DIAGNOSIS — D170 Benign lipomatous neoplasm of skin and subcutaneous tissue of head, face and neck: Secondary | ICD-10-CM | POA: Diagnosis not present

## 2017-02-22 NOTE — Progress Notes (Signed)
Emily Morales; [id number]; [birth year]   HPI   The patient is a 26 year old black female who was referred to my care by Dr. Anastasio Champion for evaluation and treatment of a lipoma on her scalp.  She previously had this excised.  It has recurred and is enlarging in size.  It is tender to touch.  No drainage has been noted.  She currently has no pain. Past Medical History:  Diagnosis Date  . Abnormal Pap smear   . Anemia   . Depression   . Headache(784.0)   . Hx of chlamydia infection   . Hypothyroidism   . Ovarian cyst, right   . Uterine fibroid     Past Surgical History:  Procedure Laterality Date  . BIOPSY  11/14/2016   Procedure: BIOPSY;  Surgeon: Danie Binder, MD;  Location: [facility];  Service: Endoscopy;;  duodenal gastric  . COLPOSCOPY    . ESOPHAGOGASTRODUODENOSCOPY (EGD) WITH PROPOFOL N/A 11/14/2016   Procedure: ESOPHAGOGASTRODUODENOSCOPY (EGD) WITH PROPOFOL;  Surgeon: Danie Binder, MD;  Location: [facility];  Service: Endoscopy;  Laterality: N/A;  230   . lipoma surgery      Family History  Problem Relation Age of Onset  . Diabetes Father   . Hypertension Father   . Other Son        reactive airway disease  . Asthma Son   . Heart murmur Son   . Cancer Maternal Grandmother        lung  . Heart disease Paternal Grandmother   . Liver disease Paternal Grandfather        etoh related  . Colon cancer Neg Hx     Current Outpatient Prescriptions on File Prior to Visit  Medication Sig Dispense Refill  . acetaminophen (TYLENOL) 325 MG tablet Take 2 tablets (650 mg total) by mouth every 6 (six) hours as needed. (Patient not taking: Reported on 01/24/2017) 30 tablet 0  . Cholecalciferol (VITAMIN D3) 5000 units TABS Take 1 tablet by mouth daily.    . cyclobenzaprine (FLEXERIL) 10 MG tablet Take 1 tablet (10 mg total) by mouth 3 (three) times daily as needed for muscle spasms. 30 tablet 0  . escitalopram (LEXAPRO) 10 MG tablet Take 10 mg by mouth daily.  3  .  fluticasone (FLONASE) 50 MCG/ACT nasal spray Place 2 sprays into both nostrils daily. (Patient not taking: Reported on 01/24/2017) 16 g 6  . levonorgestrel-ethinyl estradiol (SEASONALE,INTROVALE,JOLESSA) 0.15-0.03 MG tablet Take 1 tablet by mouth daily. 1 Package 4  . levothyroxine (SYNTHROID, LEVOTHROID) 25 MCG tablet Take 1 tablet by mouth daily.  2  . pantoprazole (PROTONIX) 40 MG tablet Take 1 tablet (40 mg total) by mouth daily before breakfast. (Patient not taking: Reported on 01/24/2017) 30 tablet 11  . sucralfate (CARAFATE) 1 g tablet Take 1 tablet (1 g total) by mouth 4 (four) times daily -  with meals and at bedtime. (Patient not taking: Reported on 01/24/2017) 120 tablet 0   No current facility-administered medications on file prior to visit.     Allergies  Allergen Reactions  . Pollen Extract Cough  . Latex Rash  . Other Rash    Coconut causes a rash    History  Alcohol Use  . Yes    Comment: occ    History  Smoking Status  . Never Smoker  Smokeless Tobacco  . Never Used    Review of Systems  Constitutional: Positive for malaise/fatigue.  HENT: Negative.   Eyes: Negative.  Respiratory: Negative.   Cardiovascular: Negative.   Gastrointestinal: Negative.   Genitourinary: Negative.   Musculoskeletal: Negative.   Skin: Negative.   Neurological: Positive for headaches.  Endo/Heme/Allergies: Negative.   Psychiatric/Behavioral: Negative.     Objective   Vitals:   02/22/17 0959  BP: 118/78  Pulse: 72  Resp: 18  Temp: 98.2 F (36.8 C)    Physical Exam  Constitutional: She is oriented to person, place, and time and well-developed, well-nourished, and in no distress.  HENT:  Head: Atraumatic.  3 cm ovoid rubbery subcutaneous mass along posterosuperior aspect of the scalp, underneath a surgical scar.  Cardiovascular: Normal rate, regular rhythm and normal heart sounds.   No murmur heard. Pulmonary/Chest: Effort normal and breath sounds normal. She has no  wheezes. She has no rales.  Neurological: She is alert and oriented to person, place, and time.  Vitals reviewed.  Dr. Lanice Shirts notes reviewed. Assessment    Recurrent lipoma, 3 cm in size, scalp Plan    patient is scheduled for excision of lipoma, scalp on 02/28/2017.  The risks and benefits of the procedure including bleeding, infection, recurrence of the lipoma were fully explained to the patient, who gave informed consent.

## 2017-02-22 NOTE — H&P (Signed)
Emily Morales; [id number]; 17-Dec-1990   HPI   The patient is a 26 year old black female who was referred to my care by Dr. Anastasio Champion for evaluation and treatment of a lipoma on her scalp.  She previously had this excised.  It has recurred and is enlarging in size.  It is tender to touch.  No drainage has been noted.  She currently has no pain. Past Medical History:  Diagnosis Date  . Abnormal Pap smear   . Anemia   . Depression   . Headache(784.0)   . Hx of chlamydia infection   . Hypothyroidism   . Ovarian cyst, right   . Uterine fibroid     Past Surgical History:  Procedure Laterality Date  . BIOPSY  11/14/2016   Procedure: BIOPSY;  Surgeon: Danie Binder, MD;  [facility]: [facility];  Service: Endoscopy;;  duodenal gastric  . COLPOSCOPY    . ESOPHAGOGASTRODUODENOSCOPY (EGD) WITH PROPOFOL N/A 11/14/2016   Procedure: ESOPHAGOGASTRODUODENOSCOPY (EGD) WITH PROPOFOL;  Surgeon: Danie Binder, MD;  [facility]: [facility];  Service: Endoscopy;  Laterality: N/A;  230   . lipoma surgery      Family History  Problem Relation Age of Onset  . Diabetes Father   . Hypertension Father   . Other Son        reactive airway disease  . Asthma Son   . Heart murmur Son   . Cancer Maternal Grandmother        lung  . Heart disease Paternal Grandmother   . Liver disease Paternal Grandfather        etoh related  . Colon cancer Neg Hx     Current Outpatient Prescriptions on File Prior to Visit  Medication Sig Dispense Refill  . acetaminophen (TYLENOL) 325 MG tablet Take 2 tablets (650 mg total) by mouth every 6 (six) hours as needed. (Patient not taking: Reported on 01/24/2017) 30 tablet 0  . Cholecalciferol (VITAMIN D3) 5000 units TABS Take 1 tablet by mouth daily.    . cyclobenzaprine (FLEXERIL) 10 MG tablet Take 1 tablet (10 mg total) by mouth 3 (three) times daily as needed for muscle spasms. 30 tablet 0  . escitalopram (LEXAPRO) 10 MG tablet Take 10 mg by mouth daily.  3  .  fluticasone (FLONASE) 50 MCG/ACT nasal spray Place 2 sprays into both nostrils daily. (Patient not taking: Reported on 01/24/2017) 16 g 6  . levonorgestrel-ethinyl estradiol (SEASONALE,INTROVALE,JOLESSA) 0.15-0.03 MG tablet Take 1 tablet by mouth daily. 1 Package 4  . levothyroxine (SYNTHROID, LEVOTHROID) 25 MCG tablet Take 1 tablet by mouth daily.  2  . pantoprazole (PROTONIX) 40 MG tablet Take 1 tablet (40 mg total) by mouth daily before breakfast. (Patient not taking: Reported on 01/24/2017) 30 tablet 11  . sucralfate (CARAFATE) 1 g tablet Take 1 tablet (1 g total) by mouth 4 (four) times daily -  with meals and at bedtime. (Patient not taking: Reported on 01/24/2017) 120 tablet 0   No current facility-administered medications on file prior to visit.     Allergies  Allergen Reactions  . Pollen Extract Cough  . Latex Rash  . Other Rash    Coconut causes a rash    History  Alcohol Use  . Yes    Comment: occ    History  Smoking Status  . Never Smoker  Smokeless Tobacco  . Never Used    Review of Systems  Constitutional: Positive for malaise/fatigue.  HENT: Negative.   Eyes: Negative.  Respiratory: Negative.   Cardiovascular: Negative.   Gastrointestinal: Negative.   Genitourinary: Negative.   Musculoskeletal: Negative.   Skin: Negative.   Neurological: Positive for headaches.  Endo/Heme/Allergies: Negative.   Psychiatric/Behavioral: Negative.     Objective   Vitals:   02/22/17 0959  BP: 118/78  Pulse: 72  Resp: 18  Temp: 98.2 F (36.8 C)    Physical Exam  Constitutional: She is oriented to person, place, and time and well-developed, well-nourished, and in no distress.  HENT:  Head: Atraumatic.  3 cm ovoid rubbery subcutaneous mass along posterosuperior aspect of the scalp, underneath a surgical scar.  Cardiovascular: Normal rate, regular rhythm and normal heart sounds.   No murmur heard. Pulmonary/Chest: Effort normal and breath sounds normal. She has no  wheezes. She has no rales.  Neurological: She is alert and oriented to person, place, and time.  Vitals reviewed.  Dr. Lanice Shirts notes reviewed. Assessment    Recurrent lipoma, 3 cm in size, scalp Plan    patient is scheduled for excision of lipoma, scalp on 02/28/2017.  The risks and benefits of the procedure including bleeding, infection, recurrence of the lipoma were fully explained to the patient, who gave informed consent.

## 2017-02-22 NOTE — Patient Instructions (Signed)
Lipoma A lipoma is a noncancerous (benign) tumor that is made up of fat cells. This is a very common type of soft-tissue growth. Lipomas are usually found under the skin (subcutaneous). They may occur in any tissue of the body that contains fat. Common areas for lipomas to appear include the back, shoulders, buttocks, and thighs. Lipomas grow slowly, and they are usually painless. Most lipomas do not cause problems and do not require treatment. What are the causes? The cause of this condition is not known. What increases the risk? This condition is more likely to develop in:  People who are 40-60 years old.  People who have a family history of lipomas.  What are the signs or symptoms? A lipoma usually appears as a small, round bump under the skin. It may feel soft or rubbery, but the firmness can vary. Most lipomas are not painful. However, a lipoma may become painful if it is located in an area where it pushes on nerves. How is this diagnosed? A lipoma can usually be diagnosed with a physical exam. You may also have tests to confirm the diagnosis and to rule out other conditions. Tests may include:  Imaging tests, such as a CT scan or MRI.  Removal of a tissue sample to be looked at under a microscope (biopsy).  How is this treated? Treatment is not needed for small lipomas that are not causing problems. If a lipoma continues to get bigger or it causes problems, removal is often the best option. Lipomas can also be removed to improve appearance. Removal of a lipoma is usually done with a surgery in which the fatty cells and the surrounding capsule are removed. Most often, a medicine that numbs the area (local anesthetic) is used for this procedure. Follow these instructions at home:  Keep all follow-up visits as directed by your health care provider. This is important. Contact a health care provider if:  Your lipoma becomes larger or hard.  Your lipoma becomes painful, red, or  increasingly swollen. These could be signs of infection or a more serious condition. This information is not intended to replace advice given to you by your health care provider. Make sure you discuss any questions you have with your health care provider. Document Released: 07/07/2002 Document Revised: 12/23/2015 Document Reviewed: 07/13/2014 [employer] Interactive Patient Education  2018 [employer]  

## 2017-02-23 NOTE — Patient Instructions (Signed)
Your procedure is scheduled on: 02/28/2017  Report to [facility] at   7:15  AM.  Call this number if you have problems the morning of surgery: [phone]   Remember:   Do not drink or eat food:After Midnight.  :  Take these medicines the morning of surgery with A SIP OF WATER: Lexapro, Flonase, levothyroxine and Protonix   Do not wear jewelry, make-up or nail polish.  Do not wear lotions, powders, or perfumes. You may wear deodorant.  Do not shave 48 hours prior to surgery. Men may shave face and neck.  Do not bring valuables to the hospital.  Contacts, dentures or bridgework may not be worn into surgery.  Leave suitcase in the car. After surgery it may be brought to your room.  For patients admitted to the hospital, checkout time is 11:00 AM the day of discharge.   Patients discharged the day of surgery will not be allowed to drive home.    Special Instructions: Shower using CHG night before surgery and shower the day of surgery use CHG.  Use special wash - you have one bottle of CHG for all showers.  You should use approximately 1/2 of the bottle for each shower.  Lipoma Removal, Care After Refer to this sheet in the next few weeks. These instructions provide you with information about caring for yourself after your procedure. Your health care provider may also give you more specific instructions. Your treatment has been planned according to current medical practices, but problems sometimes occur. Call your health care provider if you have any problems or questions after your procedure. What can I expect after the procedure? After the procedure, it is common to have:  Mild pain.  Swelling.  Bruising.  Follow these instructions at home:  Bathing  Do not take baths, swim, or use a hot tub until your health care provider approves. Ask your health care provider if you can take showers. You may only be allowed to take sponge baths for bathing.  Keep your bandage (dressing) dry  until your health care provider says it can be removed. Incision care   Follow instructions from your health care provider about how to take care of your incision. Make sure you: ? Wash your hands with soap and water before you change your bandage (dressing). If soap and water are not available, use hand sanitizer. ? Change your dressing as told by your health care provider. ? Leave stitches (sutures), skin glue, or adhesive strips in place. These skin closures may need to stay in place for 2 weeks or longer. If adhesive strip edges start to loosen and curl up, you may trim the loose edges. Do not remove adhesive strips completely unless your health care provider tells you to do that.  Check your incision area every day for signs of infection. Check for: ? More redness, swelling, or pain. ? Fluid or blood. ? Warmth. ? Pus or a bad smell. Driving  Do not drive or operate heavy machinery while taking prescription pain medicine.  Do not drive for 24 hours if you received a medicine to help you relax (sedative) during your procedure.  Ask your health care provider when it is safe for you to drive. General instructions  Take over-the-counter and prescription medicines only as told by your health care provider.  Do not use any tobacco products, such as cigarettes, chewing tobacco, and e-cigarettes. These can delay healing. If you need help quitting, ask your health care provider.  Return to your normal activities as told by your health care provider. Ask your health care provider what activities are safe for you.  Keep all follow-up visits as told by your health care provider. This is important. Contact a health care provider if:  You have more redness, swelling, or pain around your incision.  You have fluid or blood coming from your incision.  Your incision feels warm to the touch.  You have pus or a bad smell coming from your incision.  You have pain that does not get better with  medicine. Get help right away if:  You have chills or a fever.  You have severe pain. This information is not intended to replace advice given to you by your health care provider. Make sure you discuss any questions you have with your health care provider. Document Released: 09/30/2015 Document Revised: 12/28/2015 Document Reviewed: 09/30/2015 Elsevier Interactive Patient Education  2018 [place] Anesthesia, Adult, Care After These instructions provide you with information about caring for yourself after your procedure. Your health care provider may also give you more specific instructions. Your treatment has been planned according to current medical practices, but problems sometimes occur. Call your health care provider if you have any problems or questions after your procedure. What can I expect after the procedure? After the procedure, it is common to have:  Vomiting.  A sore throat.  Mental slowness.  It is common to feel:  Nauseous.  Cold or shivery.  Sleepy.  Tired.  Sore or achy, even in parts of your body where you did not have surgery.  Follow these instructions at home: For at least 24 hours after the procedure:  Do not: ? Participate in activities where you could fall or become injured. ? Drive. ? Use heavy machinery. ? Drink alcohol. ? Take sleeping pills or medicines that cause drowsiness. ? Make important decisions or sign legal documents. ? Take care of children on your own.  Rest. Eating and drinking  If you vomit, drink water, juice, or soup when you can drink without vomiting.  Drink enough fluid to keep your urine clear or pale yellow.  Make sure you have little or no nausea before eating solid foods.  Follow the diet recommended by your health care provider. General instructions  Have a responsible adult stay with you until you are awake and alert.  Return to your normal activities as told by your health care provider. Ask  your health care provider what activities are safe for you.  Take over-the-counter and prescription medicines only as told by your health care provider.  If you smoke, do not smoke without supervision.  Keep all follow-up visits as told by your health care provider. This is important. Contact a health care provider if:  You continue to have nausea or vomiting at home, and medicines are not helpful.  You cannot drink fluids or start eating again.  You cannot urinate after 8-12 hours.  You develop a skin rash.  You have fever.  You have increasing redness at the site of your procedure. Get help right away if:  You have difficulty breathing.  You have chest pain.  You have unexpected bleeding.  You feel that you are having a life-threatening or urgent problem. This information is not intended to replace advice given to you by your health care provider. Make sure you discuss any questions you have with your health care provider. Document Released: 10/23/2000 Document Revised: 12/20/2015 Document Reviewed: 07/01/2015 Elsevier Interactive  Patient Education  [employer].

## 2017-02-26 ENCOUNTER — Encounter (HOSPITAL_COMMUNITY)
Admission: RE | Admit: 2017-02-26 | Discharge: 2017-02-26 | Disposition: A | Discharge status: Home / Self Care | Payer: 59 | Source: Ambulatory Visit | Attending: General Surgery | Admitting: General Surgery

## 2017-02-26 ENCOUNTER — Encounter (HOSPITAL_COMMUNITY): Payer: Self-pay

## 2017-02-26 DIAGNOSIS — Z01812 Encounter for preprocedural laboratory examination: Secondary | ICD-10-CM

## 2017-02-26 DIAGNOSIS — D170 Benign lipomatous neoplasm of skin and subcutaneous tissue of head, face and neck: Secondary | ICD-10-CM | POA: Insufficient documentation

## 2017-02-26 DIAGNOSIS — F329 Major depressive disorder, single episode, unspecified: Secondary | ICD-10-CM | POA: Diagnosis not present

## 2017-02-26 DIAGNOSIS — R51 Headache: Secondary | ICD-10-CM | POA: Diagnosis not present

## 2017-02-26 DIAGNOSIS — D179 Benign lipomatous neoplasm, unspecified: Secondary | ICD-10-CM | POA: Diagnosis present

## 2017-02-26 DIAGNOSIS — Z91040 Latex allergy status: Secondary | ICD-10-CM | POA: Diagnosis not present

## 2017-02-26 DIAGNOSIS — Z833 Family history of diabetes mellitus: Secondary | ICD-10-CM | POA: Diagnosis not present

## 2017-02-26 DIAGNOSIS — Z8379 Family history of other diseases of the digestive system: Secondary | ICD-10-CM | POA: Diagnosis not present

## 2017-02-26 DIAGNOSIS — Z91018 Allergy to other foods: Secondary | ICD-10-CM | POA: Diagnosis not present

## 2017-02-26 DIAGNOSIS — J45909 Unspecified asthma, uncomplicated: Secondary | ICD-10-CM | POA: Diagnosis not present

## 2017-02-26 DIAGNOSIS — Z8619 Personal history of other infectious and parasitic diseases: Secondary | ICD-10-CM | POA: Diagnosis not present

## 2017-02-26 DIAGNOSIS — D649 Anemia, unspecified: Secondary | ICD-10-CM | POA: Diagnosis not present

## 2017-02-26 DIAGNOSIS — E039 Hypothyroidism, unspecified: Secondary | ICD-10-CM | POA: Diagnosis not present

## 2017-02-26 DIAGNOSIS — I499 Cardiac arrhythmia, unspecified: Secondary | ICD-10-CM | POA: Diagnosis not present

## 2017-02-26 DIAGNOSIS — Z79899 Other long term (current) drug therapy: Secondary | ICD-10-CM | POA: Diagnosis not present

## 2017-02-26 DIAGNOSIS — Z8249 Family history of ischemic heart disease and other diseases of the circulatory system: Secondary | ICD-10-CM | POA: Diagnosis not present

## 2017-02-26 DIAGNOSIS — N83201 Unspecified ovarian cyst, right side: Secondary | ICD-10-CM | POA: Diagnosis not present

## 2017-02-26 DIAGNOSIS — K219 Gastro-esophageal reflux disease without esophagitis: Secondary | ICD-10-CM | POA: Diagnosis not present

## 2017-02-26 DIAGNOSIS — Z825 Family history of asthma and other chronic lower respiratory diseases: Secondary | ICD-10-CM | POA: Diagnosis not present

## 2017-02-26 HISTORY — DX: Cardiac arrhythmia, unspecified: I49.9

## 2017-02-26 HISTORY — DX: Gastro-esophageal reflux disease without esophagitis: K21.9

## 2017-02-26 LAB — CBC WITH DIFFERENTIAL/PLATELET
BASOS ABS: 0 10*3/uL (ref 0.0–0.1)
Basophils Relative: 0 %
Eosinophils Absolute: 0.1 10*3/uL (ref 0.0–0.7)
Eosinophils Relative: 2 %
HCT: 33.3 % — ABNORMAL LOW (ref 36.0–46.0)
HEMOGLOBIN: 11.1 g/dL — AB (ref 12.0–15.0)
Lymphocytes Relative: 46 %
Lymphs Abs: 2.3 10*3/uL (ref 0.7–4.0)
MCH: 27.3 pg (ref 26.0–34.0)
MCHC: 33.3 g/dL (ref 30.0–36.0)
MCV: 82 fL (ref 78.0–100.0)
MONOS PCT: 5 %
Monocytes Absolute: 0.3 10*3/uL (ref 0.1–1.0)
NEUTROS PCT: 47 %
Neutro Abs: 2.3 10*3/uL (ref 1.7–7.7)
Platelets: 295 10*3/uL (ref 150–400)
RBC: 4.06 MIL/uL (ref 3.87–5.11)
RDW: 14.3 % (ref 11.5–15.5)
WBC: 4.9 10*3/uL (ref 4.0–10.5)

## 2017-02-26 LAB — BASIC METABOLIC PANEL
Anion gap: 8 (ref 5–15)
BUN: 10 mg/dL (ref 6–20)
CALCIUM: 9.4 mg/dL (ref 8.9–10.3)
CO2: 23 mmol/L (ref 22–32)
CREATININE: 0.69 mg/dL (ref 0.44–1.00)
Chloride: 108 mmol/L (ref 101–111)
Glucose, Bld: 107 mg/dL — ABNORMAL HIGH (ref 65–99)
Potassium: 3.6 mmol/L (ref 3.5–5.1)
SODIUM: 139 mmol/L (ref 135–145)

## 2017-02-26 LAB — HCG, SERUM, QUALITATIVE: PREG SERUM: NEGATIVE

## 2017-02-28 ENCOUNTER — Ambulatory Visit (HOSPITAL_COMMUNITY)
Admission: RE | Admit: 2017-02-28 | Discharge: 2017-02-28 | Disposition: A | Discharge status: Home / Self Care | Payer: 59 | Source: Ambulatory Visit | Attending: General Surgery | Admitting: General Surgery

## 2017-02-28 ENCOUNTER — Encounter (HOSPITAL_COMMUNITY): Payer: Self-pay | Admitting: *Deleted

## 2017-02-28 ENCOUNTER — Ambulatory Visit (HOSPITAL_COMMUNITY): Payer: 59 | Admitting: Anesthesiology

## 2017-02-28 ENCOUNTER — Encounter (HOSPITAL_COMMUNITY)
Admission: RE | Disposition: A | Discharge status: Home / Self Care | Payer: Self-pay | Source: Ambulatory Visit | Attending: General Surgery

## 2017-02-28 DIAGNOSIS — Z91040 Latex allergy status: Secondary | ICD-10-CM | POA: Insufficient documentation

## 2017-02-28 DIAGNOSIS — D649 Anemia, unspecified: Secondary | ICD-10-CM | POA: Insufficient documentation

## 2017-02-28 DIAGNOSIS — D170 Benign lipomatous neoplasm of skin and subcutaneous tissue of head, face and neck: Secondary | ICD-10-CM | POA: Diagnosis not present

## 2017-02-28 DIAGNOSIS — F329 Major depressive disorder, single episode, unspecified: Secondary | ICD-10-CM | POA: Insufficient documentation

## 2017-02-28 DIAGNOSIS — N83201 Unspecified ovarian cyst, right side: Secondary | ICD-10-CM | POA: Insufficient documentation

## 2017-02-28 DIAGNOSIS — E039 Hypothyroidism, unspecified: Secondary | ICD-10-CM | POA: Insufficient documentation

## 2017-02-28 DIAGNOSIS — Z8379 Family history of other diseases of the digestive system: Secondary | ICD-10-CM | POA: Insufficient documentation

## 2017-02-28 DIAGNOSIS — Z833 Family history of diabetes mellitus: Secondary | ICD-10-CM | POA: Diagnosis not present

## 2017-02-28 DIAGNOSIS — Z8619 Personal history of other infectious and parasitic diseases: Secondary | ICD-10-CM | POA: Diagnosis not present

## 2017-02-28 DIAGNOSIS — Z8249 Family history of ischemic heart disease and other diseases of the circulatory system: Secondary | ICD-10-CM | POA: Diagnosis not present

## 2017-02-28 DIAGNOSIS — Z825 Family history of asthma and other chronic lower respiratory diseases: Secondary | ICD-10-CM | POA: Insufficient documentation

## 2017-02-28 DIAGNOSIS — I499 Cardiac arrhythmia, unspecified: Secondary | ICD-10-CM | POA: Insufficient documentation

## 2017-02-28 DIAGNOSIS — R51 Headache: Secondary | ICD-10-CM | POA: Diagnosis not present

## 2017-02-28 DIAGNOSIS — Z79899 Other long term (current) drug therapy: Secondary | ICD-10-CM | POA: Insufficient documentation

## 2017-02-28 DIAGNOSIS — K219 Gastro-esophageal reflux disease without esophagitis: Secondary | ICD-10-CM | POA: Insufficient documentation

## 2017-02-28 DIAGNOSIS — Z91018 Allergy to other foods: Secondary | ICD-10-CM | POA: Insufficient documentation

## 2017-02-28 DIAGNOSIS — J45909 Unspecified asthma, uncomplicated: Secondary | ICD-10-CM | POA: Insufficient documentation

## 2017-02-28 HISTORY — PX: MASS EXCISION: SHX2000

## 2017-02-28 SURGERY — EXCISION MASS
Anesthesia: General | Site: Scalp

## 2017-02-28 MED ORDER — BUPIVACAINE HCL (PF) 0.5 % IJ SOLN
INTRAMUSCULAR | Status: AC
  Filled 2017-02-28: qty 30

## 2017-02-28 MED ORDER — ARTIFICIAL TEARS OPHTHALMIC OINT
TOPICAL_OINTMENT | OPHTHALMIC | Status: AC
  Filled 2017-02-28: qty 3.5

## 2017-02-28 MED ORDER — 0.9 % SODIUM CHLORIDE (POUR BTL) OPTIME
TOPICAL | Status: DC | PRN
  Administered 2017-02-28: 1000 mL

## 2017-02-28 MED ORDER — CHLORHEXIDINE GLUCONATE CLOTH 2 % EX PADS: 6.0000 | MEDICATED_PAD | Freq: Once | CUTANEOUS | Status: DC

## 2017-02-28 MED ORDER — SCOPOLAMINE 1 MG/3DAYS TD PT72
MEDICATED_PATCH | TRANSDERMAL | Status: AC
  Filled 2017-02-28: qty 1

## 2017-02-28 MED ORDER — SUCCINYLCHOLINE 20MG/ML (10ML) SYRINGE FOR MEDFUSION PUMP - OPTIME
INTRAMUSCULAR | Status: DC | PRN
  Administered 2017-02-28: 120 mg via INTRAVENOUS

## 2017-02-28 MED ORDER — FENTANYL CITRATE (PF) 100 MCG/2ML IJ SOLN
25.0000 ug | INTRAMUSCULAR | Status: DC | PRN
  Administered 2017-02-28 (×2): 50 ug via INTRAVENOUS

## 2017-02-28 MED ORDER — LACTATED RINGERS IV SOLN
INTRAVENOUS | Status: DC
  Administered 2017-02-28: 08:00:00 via INTRAVENOUS

## 2017-02-28 MED ORDER — POVIDONE-IODINE 10 % OINT PACKET
TOPICAL_OINTMENT | CUTANEOUS | Status: DC | PRN
  Administered 2017-02-28: 1 via TOPICAL

## 2017-02-28 MED ORDER — SCOPOLAMINE 1 MG/3DAYS TD PT72
1.0000 | MEDICATED_PATCH | Freq: Once | TRANSDERMAL | Status: DC
  Administered 2017-02-28: 1.5 mg via TRANSDERMAL

## 2017-02-28 MED ORDER — FENTANYL CITRATE (PF) 100 MCG/2ML IJ SOLN
INTRAMUSCULAR | Status: DC | PRN
  Administered 2017-02-28 (×3): 50 ug via INTRAVENOUS

## 2017-02-28 MED ORDER — KETOROLAC TROMETHAMINE 30 MG/ML IJ SOLN
30.0000 mg | Freq: Once | INTRAMUSCULAR | Status: AC
Start: 2017-02-28 — End: 2017-02-28
  Administered 2017-02-28: 30 mg via INTRAVENOUS

## 2017-02-28 MED ORDER — PROPOFOL 10 MG/ML IV BOLUS
INTRAVENOUS | Status: AC
  Filled 2017-02-28: qty 40

## 2017-02-28 MED ORDER — DEXAMETHASONE SODIUM PHOSPHATE 4 MG/ML IJ SOLN
4.0000 mg | Freq: Once | INTRAMUSCULAR | Status: AC
  Administered 2017-02-28: 4 mg via INTRAVENOUS

## 2017-02-28 MED ORDER — FENTANYL CITRATE (PF) 100 MCG/2ML IJ SOLN
INTRAMUSCULAR | Status: AC
  Filled 2017-02-28: qty 2

## 2017-02-28 MED ORDER — POVIDONE-IODINE 10 % EX OINT
TOPICAL_OINTMENT | CUTANEOUS | Status: AC
  Filled 2017-02-28: qty 1

## 2017-02-28 MED ORDER — LIDOCAINE HCL (CARDIAC) 10 MG/ML IV SOLN
INTRAVENOUS | Status: DC | PRN
  Administered 2017-02-28: 40 mg via INTRAVENOUS

## 2017-02-28 MED ORDER — MIDAZOLAM HCL 2 MG/2ML IJ SOLN
INTRAMUSCULAR | Status: AC
  Filled 2017-02-28: qty 2

## 2017-02-28 MED ORDER — ROCURONIUM BROMIDE 50 MG/5ML IV SOLN
INTRAVENOUS | Status: AC
  Filled 2017-02-28: qty 1

## 2017-02-28 MED ORDER — ONDANSETRON HCL 4 MG/2ML IJ SOLN
4.0000 mg | Freq: Once | INTRAMUSCULAR | Status: AC
  Administered 2017-02-28: 4 mg via INTRAVENOUS

## 2017-02-28 MED ORDER — BUPIVACAINE HCL (PF) 0.5 % IJ SOLN
INTRAMUSCULAR | Status: DC | PRN
  Administered 2017-02-28: 5 mL

## 2017-02-28 MED ORDER — ONDANSETRON HCL 4 MG/2ML IJ SOLN
INTRAMUSCULAR | Status: AC
  Filled 2017-02-28: qty 2

## 2017-02-28 MED ORDER — FENTANYL CITRATE (PF) 250 MCG/5ML IJ SOLN
INTRAMUSCULAR | Status: AC
  Filled 2017-02-28: qty 5

## 2017-02-28 MED ORDER — ROCURONIUM 10MG/ML (10ML) SYRINGE FOR MEDFUSION PUMP - OPTIME
INTRAVENOUS | Status: DC | PRN
  Administered 2017-02-28: 8 mg via INTRAVENOUS

## 2017-02-28 MED ORDER — DEXAMETHASONE SODIUM PHOSPHATE 4 MG/ML IJ SOLN
INTRAMUSCULAR | Status: AC
  Filled 2017-02-28: qty 1

## 2017-02-28 MED ORDER — MIDAZOLAM HCL 2 MG/2ML IJ SOLN
1.0000 mg | INTRAMUSCULAR | Status: AC
  Administered 2017-02-28: 2 mg via INTRAVENOUS

## 2017-02-28 MED ORDER — KETOROLAC TROMETHAMINE 30 MG/ML IJ SOLN
INTRAMUSCULAR | Status: AC
  Filled 2017-02-28: qty 1

## 2017-02-28 MED ORDER — HYDROCODONE-ACETAMINOPHEN 5-325 MG PO TABS: 1.0000 | ORAL_TABLET | ORAL | 0 refills | Status: DC | PRN

## 2017-02-28 MED ORDER — PROPOFOL 10 MG/ML IV BOLUS
INTRAVENOUS | Status: DC | PRN
  Administered 2017-02-28: 180 mg via INTRAVENOUS

## 2017-02-28 SURGICAL SUPPLY — 26 items
BAG HAMPER (MISCELLANEOUS) ×2 IMPLANT
CLOTH BEACON ORANGE TIMEOUT ST (SAFETY) ×2 IMPLANT
COVER LIGHT HANDLE STERIS (MISCELLANEOUS) ×4 IMPLANT
DECANTER SPIKE VIAL GLASS SM (MISCELLANEOUS) ×2 IMPLANT
DRAPE EENT ADH APERT 31X51 STR (DRAPES) ×2 IMPLANT
ELECT NEEDLE TIP 2.8 STRL (NEEDLE) ×2 IMPLANT
ELECT REM PT RETURN 9FT ADLT (ELECTROSURGICAL) ×2
ELECTRODE REM PT RTRN 9FT ADLT (ELECTROSURGICAL) ×1 IMPLANT
FORMALIN 10 PREFIL 120ML (MISCELLANEOUS) ×2 IMPLANT
GAUZE SPONGE 4X4 12PLY STRL (GAUZE/BANDAGES/DRESSINGS) ×4 IMPLANT
GLOVE BIOGEL PI IND STRL 7.0 (GLOVE) ×2 IMPLANT
GLOVE BIOGEL PI INDICATOR 7.0 (GLOVE) ×2
GLOVE SURG SS PI 7.5 STRL IVOR (GLOVE) ×2 IMPLANT
GOWN STRL REUS W/ TWL XL LVL3 (GOWN DISPOSABLE) ×1 IMPLANT
GOWN STRL REUS W/TWL LRG LVL3 (GOWN DISPOSABLE) ×2 IMPLANT
GOWN STRL REUS W/TWL XL LVL3 (GOWN DISPOSABLE) ×1
KIT ROOM TURNOVER APOR (KITS) ×2 IMPLANT
MANIFOLD NEPTUNE II (INSTRUMENTS) ×2 IMPLANT
NEEDLE HYPO 25X1 1.5 SAFETY (NEEDLE) ×2 IMPLANT
NS IRRIG 1000ML POUR BTL (IV SOLUTION) ×2 IMPLANT
PACK MINOR (CUSTOM PROCEDURE TRAY) ×2 IMPLANT
PAD ARMBOARD 7.5X6 YLW CONV (MISCELLANEOUS) ×2 IMPLANT
PAD TELFA 3X4 1S STER (GAUZE/BANDAGES/DRESSINGS) ×2 IMPLANT
SET BASIN LINEN APH (SET/KITS/TRAYS/PACK) ×2 IMPLANT
SYR CONTROL 10ML LL (SYRINGE) ×2 IMPLANT
TAPE CLOTH SURG 4X10 WHT LF (GAUZE/BANDAGES/DRESSINGS) ×2 IMPLANT

## 2017-02-28 NOTE — Discharge Instructions (Addendum)
Lipoma Removal, Care After Refer to this sheet in the next few weeks. These instructions provide you with information about caring for yourself after your procedure. Your health care provider may also give you more specific instructions. Your treatment has been planned according to current medical practices, but problems sometimes occur. Call your health care provider if you have any problems or questions after your procedure. What can I expect after the procedure? After the procedure, it is common to have:  Mild pain.  Swelling.  Bruising.  Follow these instructions at home:  Bathing  Do not take baths, swim, or use a hot tub until your health care provider approves. Ask your health care provider if you can take showers. You may only be allowed to take sponge baths for bathing.  Keep your bandage (dressing) dry until your health care provider says it can be removed. Incision care   Follow instructions from your health care provider about how to take care of your incision. Make sure you: ? Wash your hands with soap and water before you change your bandage (dressing). If soap and water are not available, use hand sanitizer. ? Change your dressing as told by your health care provider. ? Leave stitches (sutures), skin glue, or adhesive strips in place. These skin closures may need to stay in place for 2 weeks or longer. If adhesive strip edges start to loosen and curl up, you may trim the loose edges. Do not remove adhesive strips completely unless your health care provider tells you to do that.  Check your incision area every day for signs of infection. Check for: ? More redness, swelling, or pain. ? Fluid or blood. ? Warmth. ? Pus or a bad smell. Driving  Do not drive or operate heavy machinery while taking prescription pain medicine.  Do not drive for 24 hours if you received a medicine to help you relax (sedative) during your procedure.  Ask your health care provider when it is  safe for you to drive. General instructions  Take over-the-counter and prescription medicines only as told by your health care provider.  Do not use any tobacco products, such as cigarettes, chewing tobacco, and e-cigarettes. These can delay healing. If you need help quitting, ask your health care provider.  Return to your normal activities as told by your health care provider. Ask your health care provider what activities are safe for you.  Keep all follow-up visits as told by your health care provider. This is important. Contact a health care provider if:  You have more redness, swelling, or pain around your incision.  You have fluid or blood coming from your incision.  Your incision feels warm to the touch.  You have pus or a bad smell coming from your incision.  You have pain that does not get better with medicine. Get help right away if:  You have chills or a fever.  You have severe pain. This information is not intended to replace advice given to you by your health care provider. Make sure you discuss any questions you have with your health care provider. Document Released: 09/30/2015 Document Revised: 12/28/2015 Document Reviewed: 09/30/2015 Elsevier Interactive Patient Education  2018 [place] from Work, [employer], or Physical Activity _______________________________________________________  needs to be excused from: ____ Work ____ [employer] ____ Physical activity beginning now and through the following date: ________________. He or she may return to work or school but should still avoid the following physical activity or activities from now until ________________. Activity restrictions include: ____ Lifting more than _______ lb ____ Sitting longer than __________ minutes at a time ____ Standing longer than ________ minutes at a time ____ He or she may return to full physical  activity as of ________________. Health Care Provider Name (printed): ________________________________________ Health Care Provider (signature): ___________________________________________ Date: ________________ This information is not intended to replace advice given to you by your health care provider. Make sure you discuss any questions you have with your health care provider. Document Released: 01/10/2001 Document Revised: 06/30/2016 Document Reviewed: 02/16/2014 Elsevier Interactive Patient Education  [employer].

## 2017-02-28 NOTE — Interval H&P Note (Signed)
History and Physical Interval Note:  02/28/2017 8:00 AM  Emily Morales  has presented today for surgery, with the diagnosis of lipoma scalp  The various methods of treatment have been discussed with the patient and family. After consideration of risks, benefits and other options for treatment, the patient has consented to  Procedure(s): [place] (N/A) as a surgical intervention .  The patient's history has been reviewed, patient examined, no change in status, stable for surgery.  I have reviewed the patient's chart and labs.  Questions were answered to the patient's satisfaction.     Aviva Signs

## 2017-02-28 NOTE — Anesthesia Procedure Notes (Signed)
Procedure Name: Intubation Date/Time: 02/28/2017 8:50 AM Performed by: Tressie Stalker E Pre-anesthesia Checklist: Patient identified, Patient being monitored, Timeout performed, Emergency Drugs available and Suction available Patient Re-evaluated:Patient Re-evaluated prior to induction Oxygen Delivery Method: Circle system utilized Preoxygenation: Pre-oxygenation with 100% oxygen Induction Type: IV induction, Rapid sequence and Cricoid Pressure applied Ventilation: Mask ventilation without difficulty Laryngoscope Size: Glidescope and 3 Grade View: Grade I Tube type: Oral Tube size: 7.0 mm Number of attempts: 1 Airway Equipment and Method: Rigid stylet Placement Confirmation: ETT inserted through vocal cords under direct vision,  positive ETCO2 and breath sounds checked- equal and bilateral Secured at: 21 cm Tube secured with: Tape Dental Injury: Teeth and Oropharynx as per pre-operative assessment

## 2017-02-28 NOTE — Transfer of Care (Signed)
Immediate Anesthesia Transfer of Care Note  Patient: Emily Morales  Procedure(s) Performed: Procedure(s): EXCISION LIPOMA SCALP 3CM (N/A)  Patient Location: PACU  Anesthesia Type:General  Level of Consciousness: awake  Airway & Oxygen Therapy: Patient Spontanous Breathing  Post-op Assessment: Report given to RN  Post vital signs: Reviewed and stable  Last Vitals:  Vitals:   02/28/17 0745 02/28/17 0949  BP: 124/81 128/77  Pulse:  88  Resp: (!) 24 16  Temp:  37 C    Last Pain:  Vitals:   02/28/17 0949  TempSrc:   PainSc: Asleep      Patients Stated Pain Goal: 6 (06/16/34 6701)  Complications: No apparent anesthesia complications

## 2017-02-28 NOTE — Anesthesia Postprocedure Evaluation (Signed)
Anesthesia Post Note  Patient: Emily Morales  Procedure(s) Performed: Procedure(s) (LRB): EXCISION LIPOMA SCALP 3CM (N/A)  Patient location during evaluation: PACU Anesthesia Type: General Level of consciousness: awake and alert Pain management: satisfactory to patient Vital Signs Assessment: post-procedure vital signs reviewed and stable Respiratory status: spontaneous breathing Cardiovascular status: stable Postop Assessment: no signs of nausea or vomiting     Last Vitals:  Vitals:   02/28/17 1015 02/28/17 1030  BP: 122/77 119/62  Pulse: 66 62  Resp: 15 16  Temp:      Last Pain:  Vitals:   02/28/17 1030  TempSrc:   PainSc: 6                  Burr Soffer

## 2017-02-28 NOTE — Anesthesia Preprocedure Evaluation (Signed)
Anesthesia Evaluation  Patient identified by MRN, date of birth, ID band Patient awake    Reviewed: Allergy & Precautions, NPO status , Patient's Chart, lab work & pertinent test results  Airway Mallampati: II  TM Distance: >3 FB     Dental  (+) Teeth Intact   Pulmonary neg pulmonary ROS,    breath sounds clear to auscultation       Cardiovascular + dysrhythmias (irregular HR)  Rhythm:Regular Rate:Normal     Neuro/Psych  Headaches, PSYCHIATRIC DISORDERS Depression    GI/Hepatic GERD  Medicated and Poorly Controlled,  Endo/Other  Hypothyroidism   Renal/GU      Musculoskeletal   Abdominal   Peds  Hematology  (+) anemia ,   Anesthesia Other Findings   Reproductive/Obstetrics                             Anesthesia Physical Anesthesia Plan  ASA: II  Anesthesia Plan: General   Post-op Pain Management:    Induction: Intravenous, Rapid sequence and Cricoid pressure planned  PONV Risk Score and Plan:   Airway Management Planned: Oral ETT  Additional Equipment:   Intra-op Plan:   Post-operative Plan: Extubation in OR  Informed Consent: I have reviewed the patients History and Physical, chart, labs and discussed the procedure including the risks, benefits and alternatives for the proposed anesthesia with the patient or authorized representative who has indicated his/her understanding and acceptance.     Plan Discussed with:   Anesthesia Plan Comments:         Anesthesia Quick Evaluation

## 2017-02-28 NOTE — Op Note (Signed)
Patient:  Emily Morales  DOB:  01-17-1991  MRN:  [id number]   Preop Diagnosis:  Recurrent lipoma, scalp  Postop Diagnosis:  Same  Procedure:  Excision of lipoma, scalp  Surgeon:  Aviva Signs, M.D.  Anes:  Gen.  Indications:  Patient is a 26 year old black female who previously had a lipoma removed from the occipital region of her scalp at another facility in the past. She now presents with recurrence of the lipoma. It is enlarging. The risks and benefits of the procedure were fully explained to the patient, who gave informed consent.  Procedure note:  The patient was placed in the right lateral decubitus position after induction of general endotracheal intubation. The occipital portion of the scalp was prepped and draped using usual sterile technique with Betadine. Surgical site confirmation was performed.  A longitudinal incision was made over the mass through the previous surgical scar. This was taken down to the subcutaneous tissue. A 3 cm lipoma was found. It was excised without difficulty. It was sent to pathology for examination. A bleeding was controlled using Bovie electrocautery. The wound was irrigated with normal saline. 0.5% Sensorcaine was instilled into the surrounding wound. The skin was closed using 3-0 Prolene interrupted sutures. Betadine ointment was then applied.  All tape and needle counts were correct at the end the procedure. The patient was extubated in the operating room and transferred to PACU in stable condition.  Complications:  None  EBL:  Minimal  Specimen:  Lipoma, scalp

## 2017-03-01 ENCOUNTER — Encounter (HOSPITAL_COMMUNITY): Payer: Self-pay | Admitting: General Surgery

## 2017-03-01 ENCOUNTER — Other Ambulatory Visit: Payer: Self-pay | Admitting: Obstetrics & Gynecology

## 2017-03-01 DIAGNOSIS — N920 Excessive and frequent menstruation with regular cycle: Secondary | ICD-10-CM

## 2017-03-02 ENCOUNTER — Ambulatory Visit: Payer: 59 | Admitting: Obstetrics & Gynecology

## 2017-03-02 ENCOUNTER — Other Ambulatory Visit: Payer: 59

## 2017-03-06 ENCOUNTER — Ambulatory Visit (INDEPENDENT_AMBULATORY_CARE_PROVIDER_SITE_OTHER): Payer: 59

## 2017-03-06 ENCOUNTER — Encounter: Payer: Self-pay | Admitting: Obstetrics & Gynecology

## 2017-03-06 ENCOUNTER — Ambulatory Visit (INDEPENDENT_AMBULATORY_CARE_PROVIDER_SITE_OTHER): Payer: 59 | Admitting: Obstetrics & Gynecology

## 2017-03-06 VITALS — BP 120/80 | HR 81 | Ht 66.0 in | Wt 207.0 lb

## 2017-03-06 DIAGNOSIS — N938 Other specified abnormal uterine and vaginal bleeding: Secondary | ICD-10-CM | POA: Diagnosis not present

## 2017-03-06 DIAGNOSIS — R309 Painful micturition, unspecified: Secondary | ICD-10-CM | POA: Diagnosis not present

## 2017-03-06 DIAGNOSIS — N920 Excessive and frequent menstruation with regular cycle: Secondary | ICD-10-CM

## 2017-03-06 LAB — POCT URINALYSIS DIPSTICK
GLUCOSE UA: NEGATIVE
Ketones, UA: NEGATIVE
Leukocytes, UA: NEGATIVE
NITRITE UA: NEGATIVE
PROTEIN UA: NEGATIVE

## 2017-03-06 MED ORDER — MEGESTROL ACETATE 40 MG PO TABS: 40.0000 mg | ORAL_TABLET | Freq: Every day | ORAL | 11 refills | Status: DC

## 2017-03-06 NOTE — Progress Notes (Signed)
Follow up appointment for results  Chief Complaint  Patient presents with  . Follow-up    Discuss [place]; pain with urination    Blood pressure 120/80, pulse 81, height 5\' 6"  (1.676 m), weight 207 lb (93.9 kg), last menstrual period 01/12/2017, not currently breastfeeding.  US Transvaginal Non-ob  Result Date: 03/06/2017 GYNECOLOGIC SONOGRAM SWANNIE MILIUS is a 26 y.o. G3P2002 LMP 01/12/2017 she is here for a pelvic sonogram for,menorrhagia. Uterus                      11.2 x 5.4 x 6.4 cm,  homogeneous anteverted uterus,wnl Endometrium          13 mm, symmetrical, EEC 13 mm complex endometrium in fundal area(no color flow),endometrium tapers to 3.5 mm mid to LUS Right ovary             3.4 x 3.1 x 2.9 cm, wnl Left ovary                4.4 x 1.7 x 2.9 cm, wnl No free fluid Technician Comments: PELVIC US TA/TV: homogeneous anteverted uterus,wnl,normal ovaries bilat,ovaries appear mobile,EEC 13 mm complex endometrium in fundal area(no color flow),endometrium tapers to 3.5 mm mid to LUS,no free fluid [employer] 03/06/2017 9:47 AM Clinical Impression and recommendations: I have reviewed the sonogram results above, combined with the patient's current clinical course, below are my impressions and any appropriate recommendations for management based on the sonographic findings. Uterus is normal Endometrium is nortmal post IUD, dyssynchronous no other pathology is noted Ovaries are normal Jaslynn Thome H 03/06/2017 9:59 AM   US Pelvis Complete  Result Date: 03/06/2017 GYNECOLOGIC SONOGRAM GINA LEBLOND is a 26 y.o. G3P2002 LMP 01/12/2017 she is here for a pelvic sonogram for,menorrhagia. Uterus                      11.2 x 5.4 x 6.4 cm,  homogeneous anteverted uterus,wnl Endometrium          13 mm, symmetrical, EEC 13 mm complex endometrium in fundal area(no color flow),endometrium tapers to 3.5 mm mid to LUS Right ovary             3.4 x 3.1 x 2.9 cm, wnl Left ovary                4.4 x 1.7 x 2.9 cm, wnl  No free fluid Technician Comments: PELVIC US TA/TV: homogeneous anteverted uterus,wnl,normal ovaries bilat,ovaries appear mobile,EEC 13 mm complex endometrium in fundal area(no color flow),endometrium tapers to 3.5 mm mid to LUS,no free fluid [employer] 03/06/2017 9:47 AM Clinical Impression and recommendations: I have reviewed the sonogram results above, combined with the patient's current clinical course, below are my impressions and any appropriate recommendations for management based on the sonographic findings. Uterus is normal Endometrium is nortmal post IUD, dyssynchronous no other pathology is noted Ovaries are normal Lukah Goswami H 03/06/2017 9:59 AM      MEDS ordered this encounter: No orders of the defined types were placed in this encounter.   Orders for this encounter: Orders Placed This Encounter  Procedures  . POCT Urinalysis Dipstick    Impression: AUB, endometrial origin, due to dyssynchronous endometrium  Pain with urination - Plan: POCT Urinalysis Dipstick    - POCT Urinalysis Dipstick   Plan: Culture urine  Will "cycle" with megestrol 3-4 weeks on then 1 week off to synchronize her endometrium  Follow Up: Return in about 3  months (around 06/06/2017) for Follow up, with Dr Elonda Husky.       Face to face time:  15 minutes  Greater than 50% of the visit time was spent in counseling and coordination of care with the patient.  The summary and outline of the counseling and care coordination is summarized in the note above.   All questions were answered.  Past Medical History:  Diagnosis Date  . Abnormal Pap smear   . Anemia   . Depression   . Dysrhythmia    irregular heartrate  . GERD (gastroesophageal reflux disease)   . Headache(784.0)   . Hx of chlamydia infection   . Hypothyroidism   . Ovarian cyst, right   . Uterine fibroid     Past Surgical History:  Procedure Laterality Date  . BIOPSY  11/14/2016   Procedure: BIOPSY;  Surgeon: Danie Binder,  MD;  Location: [facility];  Service: Endoscopy;;  duodenal gastric  . COLPOSCOPY    . ESOPHAGOGASTRODUODENOSCOPY (EGD) WITH PROPOFOL N/A 11/14/2016   Procedure: ESOPHAGOGASTRODUODENOSCOPY (EGD) WITH PROPOFOL;  Surgeon: Danie Binder, MD;  Location: [facility];  Service: Endoscopy;  Laterality: N/A;  230   . lipoma surgery    . MASS EXCISION N/A 02/28/2017   Procedure: EXCISION LIPOMA SCALP 3CM;  Surgeon: Aviva Signs, MD;  Location: [facility];  Service: General;  Laterality: N/A;    OB History    Gravida Para Term Preterm AB Living   2 2 2  0 0 2   SAB TAB Ectopic Multiple Live Births   0 0 0 0 2      Allergies  Allergen Reactions  . Pollen Extract Cough  . Latex Rash  . Other Rash    Coconut causes a rash    Social History   Social History  . Marital status: Married    Spouse name: N/A  . Number of children: N/A  . Years of education: N/A   Occupational History  . register    Social History Main Topics  . Smoking status: Never Smoker  . Smokeless tobacco: Never Used  . Alcohol use Yes     Comment: occ  . Drug use: No  . Sexual activity: Not Currently    Partners: Male   Other Topics Concern  . None   Social History Narrative  . None    Family History  Problem Relation Age of Onset  . Diabetes Father   . Hypertension Father   . Other Son        reactive airway disease  . Asthma Son   . Heart murmur Son   . Cancer Maternal Grandmother        lung  . Heart disease Paternal Grandmother   . Liver disease Paternal Grandfather        etoh related  . Colon cancer Neg Hx

## 2017-03-06 NOTE — Addendum Note (Signed)
Addended by: Linton Rump on: 03/06/2017 10:16 AM   Modules accepted: Orders

## 2017-03-06 NOTE — Addendum Note (Signed)
Addended by: Florian Buff on: 03/06/2017 10:06 AM   Modules accepted: Orders

## 2017-03-06 NOTE — Progress Notes (Signed)
PELVIC US TA/TV: homogeneous anteverted uterus,wnl,normal ovaries bilat,ovaries appear mobile,EEC 13 mm complex endometrium in fundal area(no color flow),endometrium tapers to 3.5 mm mid to LUS,no free fluid

## 2017-03-08 LAB — URINE CULTURE

## 2017-03-13 ENCOUNTER — Other Ambulatory Visit (HOSPITAL_COMMUNITY): Payer: 59

## 2017-03-15 ENCOUNTER — Encounter: Payer: Self-pay | Admitting: General Surgery

## 2017-03-15 ENCOUNTER — Ambulatory Visit (INDEPENDENT_AMBULATORY_CARE_PROVIDER_SITE_OTHER): Payer: Self-pay | Admitting: General Surgery

## 2017-03-15 VITALS — BP 133/76 | HR 73 | Temp 98.4°F | Resp 18 | Ht 66.0 in | Wt 210.0 lb

## 2017-03-15 DIAGNOSIS — Z09 Encounter for follow-up examination after completed treatment for conditions other than malignant neoplasm: Secondary | ICD-10-CM

## 2017-03-15 NOTE — Progress Notes (Signed)
Subjective:     Emily Morales  Status post excision of lipoma of scalp. Patient doing well. Has noticed significant decrease in swelling at incision site. Objective:    BP 133/76   Pulse 73   Temp 98.4 F (36.9 C)   Resp 18   Ht 5\' 6"  (1.676 m)   Wt 210 lb (95.3 kg)   BMI 33.89 kg/m  Well-developed and well-nourished black female in no acute distress. Incision healing well. Sutures removed.   no significant swelling noted.   final pathology consistent with diagnosis.  Assessment:    Doing well postoperatively.    Plan:   Follow-up as needed.

## 2017-03-16 ENCOUNTER — Ambulatory Visit: Admit: 2017-03-16 | Payer: 59 | Admitting: General Surgery

## 2017-03-16 SURGERY — EXCISION MASS
Anesthesia: General | Laterality: Left

## 2017-03-19 ENCOUNTER — Ambulatory Visit: Payer: 59 | Admitting: Gastroenterology

## 2017-04-16 ENCOUNTER — Telehealth: Payer: 59 | Admitting: Family

## 2017-04-16 DIAGNOSIS — B373 Candidiasis of vulva and vagina: Secondary | ICD-10-CM | POA: Diagnosis not present

## 2017-04-16 DIAGNOSIS — B3731 Acute candidiasis of vulva and vagina: Secondary | ICD-10-CM

## 2017-04-16 MED ORDER — FLUCONAZOLE 150 MG PO TABS: 150.0000 mg | ORAL_TABLET | Freq: Once | ORAL | 0 refills | Status: AC

## 2017-04-16 NOTE — Progress Notes (Signed)
Thank you for the details you put in the comment boxes. Those details really help [place] take better care of you.   We are sorry that you are not feeling well. Here is how we plan to help! Based on what you shared with me it looks like you: May have a yeast vaginosis  Vaginosis is an inflammation of the vagina that can result in discharge, itching and pain. The cause is usually a change in the normal balance of vaginal bacteria or an infection. Vaginosis can also result from reduced estrogen levels after menopause.  The most common causes of vaginosis are:   Bacterial vaginosis which results from an overgrowth of one on several organisms that are normally present in your vagina.   Yeast infections which are caused by a naturally occurring fungus called candida.   Vaginal atrophy (atrophic vaginosis) which results from the thinning of the vagina from reduced estrogen levels after menopause.   Trichomoniasis which is caused by a parasite and is commonly transmitted by sexual intercourse.  Factors that increase your risk of developing vaginosis include: Marland Kitchen Medications, such as antibiotics and steroids . Uncontrolled diabetes . Use of hygiene products such as bubble bath, vaginal spray or vaginal deodorant . Douching . Wearing damp or tight-fitting clothing . Using an intrauterine device (IUD) for birth control . Hormonal changes, such as those associated with pregnancy, birth control pills or menopause . Sexual activity . Having a sexually transmitted infection  Your treatment plan is A single Diflucan (fluconazole) 150mg  tablet once.  I have electronically sent this prescription into the pharmacy that you have chosen.  Be sure to take all of the medication as directed. Stop taking any medication if you develop a rash, tongue swelling or shortness of breath. Mothers who are breast feeding should consider pumping and discarding their breast milk while on these antibiotics. However, there is no  consensus that infant exposure at these doses would be harmful.  Remember that medication creams can weaken latex condoms. Marland Kitchen   HOME CARE:  Good hygiene may prevent some types of vaginosis from recurring and may relieve some symptoms:  . Avoid baths, hot tubs and whirlpool spas. Rinse soap from your outer genital area after a shower, and dry the area well to prevent irritation. Don't use scented or harsh soaps, such as those with deodorant or antibacterial action. Marland Kitchen Avoid irritants. These include scented tampons and pads. . Wipe from front to back after using the toilet. Doing so avoids spreading fecal bacteria to your vagina.  Other things that may help prevent vaginosis include:  Marland Kitchen Don't douche. Your vagina doesn't require cleansing other than normal bathing. Repetitive douching disrupts the normal organisms that reside in the vagina and can actually increase your risk of vaginal infection. Douching won't clear up a vaginal infection. . Use a latex condom. Both female and female latex condoms may help you avoid infections spread by sexual contact. . Wear cotton underwear. Also wear pantyhose with a cotton crotch. If you feel comfortable without it, skip wearing underwear to bed. Yeast thrives in [employer] Your symptoms should improve in the next day or two.  GET HELP RIGHT AWAY IF:  . You have pain in your lower abdomen ( pelvic area or over your ovaries) . You develop nausea or vomiting . You develop a fever . Your discharge changes or worsens . You have persistent pain with intercourse . You develop shortness of breath, a rapid pulse, or you faint.  These symptoms  could be signs of problems or infections that need to be evaluated by a medical provider now.  MAKE SURE YOU    Understand these instructions.  Will watch your condition.  Will get help right away if you are not doing well or get worse.  Your e-visit answers were reviewed by a board certified advanced  clinical practitioner to complete your personal care plan. Depending upon the condition, your plan could have included both over the counter or prescription medications. Please review your pharmacy choice to make sure that you have choses a pharmacy that is open for you to pick up any needed prescription, Your safety is important to [place]. If you have drug allergies check your prescription carefully.   You can use MyChart to ask questions about today's visit, request a non-urgent call back, or ask for a work or school excuse for 24 hours related to this e-Visit. If it has been greater than 24 hours you will need to follow up with your provider, or enter a new e-Visit to address those concerns. You will get a MyChart message within the next two days asking about your experience. I hope that your e-visit has been valuable and will speed your recovery.

## 2017-04-17 DIAGNOSIS — Z0389 Encounter for observation for other suspected diseases and conditions ruled out: Secondary | ICD-10-CM | POA: Diagnosis not present

## 2017-05-08 ENCOUNTER — Ambulatory Visit (INDEPENDENT_AMBULATORY_CARE_PROVIDER_SITE_OTHER): Payer: 59 | Admitting: Gastroenterology

## 2017-05-08 ENCOUNTER — Encounter: Payer: Self-pay | Admitting: Gastroenterology

## 2017-05-08 VITALS — BP 106/69 | HR 69 | Temp 97.9°F | Ht 66.0 in | Wt 210.0 lb

## 2017-05-08 DIAGNOSIS — R0789 Other chest pain: Secondary | ICD-10-CM | POA: Insufficient documentation

## 2017-05-08 DIAGNOSIS — K5900 Constipation, unspecified: Secondary | ICD-10-CM

## 2017-05-08 DIAGNOSIS — D649 Anemia, unspecified: Secondary | ICD-10-CM

## 2017-05-08 NOTE — Assessment & Plan Note (Signed)
Continues to have heavy menses although improving. Repeat lab.

## 2017-05-08 NOTE — Assessment & Plan Note (Signed)
Poorly controlled as she was unable to tolerate Linzess 290 g daily. Provided her with 72 and 145 g samples to try. She will call and let us I which dose works best for her.

## 2017-05-08 NOTE — Patient Instructions (Signed)
1. Stop Linzess 290g and try the 72 or 145 g samples provided today.  2. We did not have Dexilant samples. Increase pantoprazole to twice a day, 30 minutes before breakfast and 30 minutes before her evening meal while we wait for Dr. Oneida Alar recommendations. 3. Please have your blood work done.

## 2017-05-08 NOTE — Progress Notes (Signed)
cc'ed to pcp °

## 2017-05-08 NOTE — Progress Notes (Addendum)
REVIEWED. RECOMMEND PT AVOID CARBONATED/CAFFEINATED BEVERAGES AS WLEL AS OTHER REFLUX TRIGGERS. CONTINUE PROTONIX. LOSE TEN POUNDS. CONSIDER INCREASING LEXAPRO TO 20 MG DAILY.  Primary Care Physician: Doree Albee, MD  Primary Gastroenterologist:  Barney Drain, MD   Chief Complaint  Patient presents with  . Follow-up    GERD/chest pain about twice per week    HPI: Emily Morales is a 26 y.o. female here for follow-up. Last seen in April. History of chronic right flank pain, chest pain. Symptoms dating back at least to mid 2017. Chronic constipation. CTA chest June 2017 unremarkable. CT abdomen and pelvis October 2017 with bilateral ovarian cyst, largest measuring 3.5 cm on the right, broad based midline diastasis rectus and small fat-containing paraumbilical hernia. Right upper quadrant ultrasound March 2018 unremarkable. HIDA scan May 2018 with mildly decreased gallbladder ejection fraction of 28% following Ensure, no reproduction of symptoms. EGD back in April showed mild gastritis, esophagus normal, biopsies showed inflammation of the small bowel, gastric biopsies with no H. pylori gastritis.  Dr. Anastasio Champion had several EKGs and ok.   Continues to have atypical chest pain about twice per week. Sometimes wakes up from sleep. Not always with food. Symptoms started in the epigastrium radiating up to the center of the chest and into the jaw also and into her back. Usually lasting for about 5-6 minutes at a time. Described as squeezing type pain. Has to get up and walk around to alleviate. Water sometimes seems to help. Has been treated as possible muscle spasm, chest wall pain with muscle relaxer and anti-inflammatories without relief. Has been on PPI, pantoprazole daily for several months without relief. Denies typical heartburn. No dysphagia. Continues to have constipation but did not tolerate linzess 273mcg. can only use as needed. We'll usually goes without a bowel movement every  2-3 days and takes a dose. No melena or rectal bleeding. Continues to have heavy menses. No longer on Megace. 1 month with regular cycle, prior to that bled for 2 months straight. Passes quarter-sized blood clots at times. No longer with an IUD.  Weight is up 5-10 pounds since the spring. She drinks at least 40 ounces of [facility] daily.  Current Outpatient Prescriptions  Medication Sig Dispense Refill  . acetaminophen (TYLENOL) 325 MG tablet Take 2 tablets (650 mg total) by mouth every 6 (six) hours as needed. (Patient taking differently: Take 650 mg by mouth every 6 (six) hours as needed. ) 30 tablet 0  . Cholecalciferol (VITAMIN D3) 5000 units TABS Take 2 tablets by mouth daily.     . cyclobenzaprine (FLEXERIL) 10 MG tablet Take 1 tablet (10 mg total) by mouth 3 (three) times daily as needed for muscle spasms. 30 tablet 0  . escitalopram (LEXAPRO) 10 MG tablet Take 10 mg by mouth daily.  3  . levothyroxine (SYNTHROID, LEVOTHROID) 50 MCG tablet Take 1 tablet by mouth daily.  2  . pantoprazole (PROTONIX) 40 MG tablet Take 1 tablet (40 mg total) by mouth daily before breakfast. 30 tablet 11   No current facility-administered medications for this visit.     Allergies as of 05/08/2017 - Review Complete 05/08/2017  Allergen Reaction Noted  . Pollen extract Cough 11/11/2012  . Latex Rash 04/16/2013  . Other Rash 08/22/2011    ROS:  General: Negative for anorexia, weight loss, fever, chills, fatigue, weakness. ENT: Negative for hoarseness, difficulty swallowing , nasal congestion. CV: Negative for chest pain, angina, palpitations, dyspnea on exertion, peripheral edema.  Respiratory:  Negative for dyspnea at rest, dyspnea on exertion, cough, sputum, wheezing.  GI: See history of present illness. GU:  Negative for dysuria, hematuria, urinary incontinence, urinary frequency, nocturnal urination.  Endo: Negative for unusual weight change.    Physical Examination:   BP 106/69   Pulse 69    Temp 97.9 F (36.6 C) (Oral)   Ht 5\' 6"  (1.676 m)   Wt 210 lb (95.3 kg)   BMI 33.89 kg/m   General: Well-nourished, well-developed in no acute distress.  Eyes: No icterus. Mouth: Oropharyngeal mucosa moist and pink , no lesions erythema or exudate. Lungs: Clear to auscultation bilaterally.  Heart: Regular rate and rhythm, no murmurs rubs or gallops.  Abdomen: Bowel sounds are normal, nontender, nondistended, no hepatosplenomegaly or masses, no abdominal bruits or hernia , no rebound or guarding.   Extremities: No lower extremity edema. No clubbing or deformities. Neuro: Alert and oriented x 4   Skin: Warm and dry, no jaundice.   Psych: Alert and cooperative, normal mood and affect.  Labs:  Lab Results  Component Value Date   CREATININE 0.69 02/26/2017   BUN 10 02/26/2017   NA 139 02/26/2017   K 3.6 02/26/2017   CL 108 02/26/2017   CO2 23 02/26/2017   Lab Results  Component Value Date   ALT 12 (L) 08/17/2015   AST 18 08/17/2015   ALKPHOS 91 08/17/2015   BILITOT 0.6 08/17/2015   Lab Results  Component Value Date   WBC 4.9 02/26/2017   HGB 11.1 (L) 02/26/2017   HCT 33.3 (L) 02/26/2017   MCV 82.0 02/26/2017   PLT 295 02/26/2017    Imaging Studies: No results found.

## 2017-05-08 NOTE — Assessment & Plan Note (Signed)
Denies typical reflux symptoms. She complains of couple episodes per week of pain starting in the epigastrium and radiating up into her chest and throat and through to her back which lasts for 5-6 minutes at a time. Happens with without meals. Wakes her up from sleep. Denies exertional component. Getting up and moving around or drinking water seems to help. Question esophageal spasm. She's had a pretty extensive work up at this point. To discuss further with Dr. Oneida Alar. Esophageal manometry may be difficult to catch her symptoms as they are twice weekly, may empirically treat. In the meantime increase her pantoprazole to 40 mg twice a day. Cut back on [facility] consumption. Recommend slow gradual weight loss.

## 2017-05-09 DIAGNOSIS — F339 Major depressive disorder, recurrent, unspecified: Secondary | ICD-10-CM | POA: Diagnosis not present

## 2017-05-09 DIAGNOSIS — E039 Hypothyroidism, unspecified: Secondary | ICD-10-CM | POA: Diagnosis not present

## 2017-05-09 DIAGNOSIS — E559 Vitamin D deficiency, unspecified: Secondary | ICD-10-CM | POA: Diagnosis not present

## 2017-05-22 NOTE — Progress Notes (Signed)
Please let patient know, Dr. Oneida Alar recommends the following: RECOMMEND PT AVOID CARBONATED/CAFFEINATED BEVERAGES AS WLEL AS OTHER REFLUX TRIGGERS. CONTINUE PROTONIX. LOSE TEN POUNDS. CONSIDER INCREASING LEXAPRO TO 20 MG DAILY.  Also please remind her to have her blood work done. Return office visit in 2 months or call sooner if needed.Marland Kitchen

## 2017-05-28 ENCOUNTER — Encounter (HOSPITAL_COMMUNITY): Payer: Self-pay | Admitting: Emergency Medicine

## 2017-05-28 ENCOUNTER — Emergency Department (HOSPITAL_COMMUNITY)
Admission: EM | Admit: 2017-05-28 | Discharge: 2017-05-28 | Disposition: A | Discharge status: Home / Self Care | Payer: 59 | Attending: Emergency Medicine | Admitting: Emergency Medicine

## 2017-05-28 ENCOUNTER — Emergency Department (HOSPITAL_COMMUNITY): Payer: 59

## 2017-05-28 DIAGNOSIS — Z91040 Latex allergy status: Secondary | ICD-10-CM | POA: Insufficient documentation

## 2017-05-28 DIAGNOSIS — Z86018 Personal history of other benign neoplasm: Secondary | ICD-10-CM | POA: Diagnosis not present

## 2017-05-28 DIAGNOSIS — R079 Chest pain, unspecified: Secondary | ICD-10-CM | POA: Diagnosis not present

## 2017-05-28 DIAGNOSIS — Z79899 Other long term (current) drug therapy: Secondary | ICD-10-CM | POA: Diagnosis not present

## 2017-05-28 DIAGNOSIS — R0789 Other chest pain: Secondary | ICD-10-CM | POA: Insufficient documentation

## 2017-05-28 DIAGNOSIS — E039 Hypothyroidism, unspecified: Secondary | ICD-10-CM | POA: Diagnosis not present

## 2017-05-28 LAB — TROPONIN I

## 2017-05-28 LAB — CBC WITH DIFFERENTIAL/PLATELET
Basophils Absolute: 0 10*3/uL (ref 0.0–0.1)
Basophils Relative: 0 %
EOS ABS: 0.1 10*3/uL (ref 0.0–0.7)
EOS PCT: 3 %
HCT: 31.8 % — ABNORMAL LOW (ref 36.0–46.0)
Hemoglobin: 10.3 g/dL — ABNORMAL LOW (ref 12.0–15.0)
LYMPHS ABS: 2.4 10*3/uL (ref 0.7–4.0)
Lymphocytes Relative: 48 %
MCH: 27 pg (ref 26.0–34.0)
MCHC: 32.4 g/dL (ref 30.0–36.0)
MCV: 83.2 fL (ref 78.0–100.0)
MONOS PCT: 6 %
Monocytes Absolute: 0.3 10*3/uL (ref 0.1–1.0)
Neutro Abs: 2.1 10*3/uL (ref 1.7–7.7)
Neutrophils Relative %: 43 %
PLATELETS: 232 10*3/uL (ref 150–400)
RBC: 3.82 MIL/uL — AB (ref 3.87–5.11)
RDW: 14.3 % (ref 11.5–15.5)
WBC: 4.9 10*3/uL (ref 4.0–10.5)

## 2017-05-28 LAB — COMPREHENSIVE METABOLIC PANEL
ALT: 16 U/L (ref 14–54)
AST: 18 U/L (ref 15–41)
Albumin: 4 g/dL (ref 3.5–5.0)
Alkaline Phosphatase: 87 U/L (ref 38–126)
Anion gap: 9 (ref 5–15)
BUN: 12 mg/dL (ref 6–20)
CHLORIDE: 102 mmol/L (ref 101–111)
CO2: 25 mmol/L (ref 22–32)
CREATININE: 0.64 mg/dL (ref 0.44–1.00)
Calcium: 8.8 mg/dL — ABNORMAL LOW (ref 8.9–10.3)
Glucose, Bld: 90 mg/dL (ref 65–99)
POTASSIUM: 3.7 mmol/L (ref 3.5–5.1)
SODIUM: 136 mmol/L (ref 135–145)
Total Bilirubin: 0.4 mg/dL (ref 0.3–1.2)
Total Protein: 7.4 g/dL (ref 6.5–8.1)

## 2017-05-28 LAB — LIPASE, BLOOD: LIPASE: 18 U/L (ref 11–51)

## 2017-05-28 MED ORDER — HYDROCODONE-ACETAMINOPHEN 5-325 MG PO TABS: 1.0000 | ORAL_TABLET | Freq: Four times a day (QID) | ORAL | 0 refills | Status: DC | PRN

## 2017-05-28 MED ORDER — HYDROCODONE-ACETAMINOPHEN 5-325 MG PO TABS
1.0000 | ORAL_TABLET | Freq: Once | ORAL | Status: AC
Start: 2017-05-28 — End: 2017-05-28
  Administered 2017-05-28: 1 via ORAL

## 2017-05-28 MED ORDER — HYDROCODONE-ACETAMINOPHEN 5-325 MG PO TABS
ORAL_TABLET | ORAL | Status: AC
  Filled 2017-05-28: qty 1

## 2017-05-28 NOTE — ED Triage Notes (Signed)
Pt has intermittent chest spasms for over one year. Pt states has been treated for GERD/ had multiple testing including EKG and endo procedures. hida scan as well. Pt here today due to having 5 episodes today.denies SOB at this time. Denies pain at this time.

## 2017-05-28 NOTE — ED Notes (Signed)
Pt reports her pain is gone at this time.

## 2017-05-28 NOTE — Discharge Instructions (Signed)
Follow up with your family md and gi md

## 2017-05-28 NOTE — ED Provider Notes (Signed)
[facility] EMERGENCY DEPARTMENT Provider Note   CSN: [id number] Arrival date & time: 05/28/17  1813     History   Chief Complaint Chief Complaint  Patient presents with  . Chest Pain    HPI Emily Morales is a 26 y.o. female.  Patient states that she was having severe chest pain that lasted about 10 minutes earlier.  Patient has been treated for esophagitis with Protonix.   The history is provided by the patient.  Chest Pain   This is a recurrent problem. The current episode started more than 1 week ago. The problem occurs every several days. The problem has been resolved. Associated with: Unknown. The pain is present in the substernal region. The pain is at a severity of 7/10. The pain is moderate. The quality of the pain is described as sharp. The pain radiates to the upper back. Pertinent negatives include no abdominal pain, no back pain, no cough and no headaches.  Pertinent negatives for past medical history include no seizures.    Past Medical History:  Diagnosis Date  . Abnormal Pap smear   . Anemia   . Depression   . Dysrhythmia    irregular heartrate  . GERD (gastroesophageal reflux disease)   . Headache(784.0)   . Hx of chlamydia infection   . Hypothyroidism   . Ovarian cyst, right   . Uterine fibroid     Patient Active Problem List   Diagnosis Date Noted  . Atypical chest pain 05/08/2017  . Benign lipomatous neoplasm of skin and subcutaneous tissue of head, face and neck   . Dyspepsia   . Right flank pain 11/07/2016  . Anemia 11/07/2016  . GERD (gastroesophageal reflux disease) 11/07/2016  . Constipation 11/07/2016  . Encounter for IUD insertion liletta 12/10/2015  . Mild postpartum depression 11/17/2015  . Umbilical hernia without obstruction and without gangrene 09/08/2015  . Oligouria 08/17/2015  . Mild dysplasia of cervix 03/26/2014    Past Surgical History:  Procedure Laterality Date  . BIOPSY  11/14/2016   Procedure: BIOPSY;   Surgeon: Danie Binder, MD;  Location: [facility] ENDO SUITE;  Service: Endoscopy;;  duodenal gastric  . COLPOSCOPY    . ESOPHAGOGASTRODUODENOSCOPY (EGD) WITH PROPOFOL N/A 11/14/2016   Procedure: ESOPHAGOGASTRODUODENOSCOPY (EGD) WITH PROPOFOL;  Surgeon: Danie Binder, MD;  Location: [facility] ENDO SUITE;  Service: Endoscopy;  Laterality: N/A;  230   . lipoma surgery    . MASS EXCISION N/A 02/28/2017   Procedure: EXCISION LIPOMA SCALP 3CM;  Surgeon: Aviva Signs, MD;  Location: [facility] ORS;  Service: General;  Laterality: N/A;    OB History    Gravida Para Term Preterm AB Living   2 2 2  0 0 2   SAB TAB Ectopic Multiple Live Births   0 0 0 0 2       Home Medications    Prior to Admission medications   Medication Sig Start Date End Date Taking? Authorizing Provider  Cholecalciferol (VITAMIN D3) 5000 units TABS Take 2 tablets by mouth daily.    Yes [provider]  cyclobenzaprine (FLEXERIL) 10 MG tablet Take 1 tablet (10 mg total) by mouth 3 (three) times daily as needed for muscle spasms. 02/17/17  Yes Withrow, Elyse Jarvis, FNP  escitalopram (LEXAPRO) 10 MG tablet Take 10 mg by mouth daily as needed (for depression).  10/11/16  Yes [provider]  levothyroxine (SYNTHROID, LEVOTHROID) 75 MCG tablet Take 75 mcg by mouth daily.  11/29/16  Yes [provider]  linaclotide (LINZESS) 72 MCG capsule Take 72 mcg by mouth daily as needed (for constipation).   Yes [provider]  pantoprazole (PROTONIX) 40 MG tablet Take 1 tablet (40 mg total) by mouth daily before breakfast. Patient taking differently: Take 40 mg by mouth 2 (two) times daily.  11/07/16  Yes Mahala Menghini, PA-C  HYDROcodone-acetaminophen (NORCO/VICODIN) 5-325 MG tablet Take 1 tablet by mouth every 6 (six) hours as needed for moderate pain. 05/28/17   Milton Ferguson, MD    Family History Family History  Problem Relation Age of Onset  . Diabetes Father   . Hypertension Father   . Other Son        reactive airway  disease  . Asthma Son   . Heart murmur Son   . Cancer Maternal Grandmother        lung  . Heart disease Paternal Grandmother   . Liver disease Paternal Grandfather        etoh related  . Colon cancer Neg Hx     Social History Social History  Substance Use Topics  . Smoking status: Never Smoker  . Smokeless tobacco: Never Used  . Alcohol use Yes     Comment: occ     Allergies   Pollen extract; Latex; and Other   Review of Systems Review of Systems  Constitutional: Negative for appetite change and fatigue.  HENT: Negative for congestion, ear discharge and sinus pressure.   Eyes: Negative for discharge.  Respiratory: Negative for cough.   Cardiovascular: Positive for chest pain.  Gastrointestinal: Negative for abdominal pain and diarrhea.  Genitourinary: Negative for frequency and hematuria.  Musculoskeletal: Negative for back pain.  Skin: Negative for rash.  Neurological: Negative for seizures and headaches.  Psychiatric/Behavioral: Negative for hallucinations.     Physical Exam Updated Vital Signs BP 132/70 (BP Location: Right Arm)   Pulse 76   Temp 98.3 F (36.8 C) (Oral)   Resp 16   Ht 5\' 6"  (1.676 m)   Wt 95.3 kg (210 lb)   LMP 05/28/2017   SpO2 100%   BMI 33.89 kg/m   Physical Exam  Constitutional: She is oriented to person, place, and time. She appears well-developed.  HENT:  Head: Normocephalic.  Eyes: Conjunctivae and EOM are normal. No scleral icterus.  Neck: Neck supple. No thyromegaly present.  Cardiovascular: Normal rate and regular rhythm.  Exam reveals no gallop and no friction rub.   No murmur heard. Pulmonary/Chest: No stridor. She has no wheezes. She has no rales. She exhibits no tenderness.  Abdominal: She exhibits no distension. There is no tenderness. There is no rebound.  Musculoskeletal: Normal range of motion. She exhibits no edema.  Lymphadenopathy:    She has no cervical adenopathy.  Neurological: She is oriented to person,  place, and time. She exhibits normal muscle tone. Coordination normal.  Skin: No rash noted. No erythema.  Psychiatric: She has a normal mood and affect. Her behavior is normal.     ED Treatments / Results  Labs (all labs ordered are listed, but only abnormal results are displayed) Labs Reviewed  CBC WITH DIFFERENTIAL/PLATELET - Abnormal; Notable for the following:       Result Value   RBC 3.82 (*)    Hemoglobin 10.3 (*)    HCT 31.8 (*)    All other components within normal limits  COMPREHENSIVE METABOLIC PANEL - Abnormal; Notable for the following:    Calcium 8.8 (*)    All other components within normal limits  LIPASE, BLOOD  TROPONIN I    EKG  EKG Interpretation  Date/Time:  Monday May 28 2017 19:44:32 EDT Ventricular Rate:  70 PR Interval:    QRS Duration: 104 QT Interval:  414 QTC Calculation: 447 R Axis:   16 Text Interpretation:  Sinus rhythm Confirmed by Milton Ferguson [phone]) on 05/28/2017 9:09:00 PM       Radiology Dg Chest 2 View  Result Date: 05/28/2017 CLINICAL DATA:  Bilateral upper chest pain radiating to the back for over a year. EXAM: CHEST  2 VIEW COMPARISON:  12/21/2016 FINDINGS: The heart size and mediastinal contours are within normal limits. No aortic aneurysm or mediastinal widening. No pneumomediastinum. No pneumothorax. Both lungs are clear. The visualized skeletal structures are unremarkable. IMPRESSION: No active cardiopulmonary disease. Electronically Signed   By: Ashley Royalty M.D.   On: 05/28/2017 20:31    Procedures Procedures (including critical care time)  Medications Ordered in ED Medications - No data to display   Initial Impression / Assessment and Plan / ED Course  I have reviewed the triage vital signs and the nursing notes.  Pertinent labs & imaging results that were available during my care of the patient were reviewed by me and considered in my medical decision making (see chart for details).     Labs unremarkable.   Chest pain not cardiac related.  Patient will be referred back to her GI doctor and her family doctor.  Suspect a lot of this pain is related to stress  Final Clinical Impressions(s) / ED Diagnoses   Final diagnoses:  Atypical chest pain    New Prescriptions New Prescriptions   HYDROCODONE-ACETAMINOPHEN (NORCO/VICODIN) 5-325 MG TABLET    Take 1 tablet by mouth every 6 (six) hours as needed for moderate pain.     Milton Ferguson, MD 05/28/17 2122

## 2017-05-29 DIAGNOSIS — F410 Panic disorder [episodic paroxysmal anxiety] without agoraphobia: Secondary | ICD-10-CM | POA: Diagnosis not present

## 2017-05-31 ENCOUNTER — Encounter: Payer: Self-pay | Admitting: Gastroenterology

## 2017-05-31 NOTE — Progress Notes (Signed)
PT is aware. Forwarding to Stacey to schedule the OV.

## 2017-05-31 NOTE — Progress Notes (Signed)
PATIENT SCHEDULED  °

## 2017-06-04 IMAGING — US US ABDOMEN LIMITED
1 series · 14 of 25 positions shown · non-contrast
Comparison: Abdominal CT 05/12/2016

CLINICAL DATA: Generalized abdominal pain for 4-5 weeks

EXAM:
US ABDOMEN LIMITED - RIGHT UPPER QUADRANT

[Series 1: us abdomen limited · 0.20mm/px · 14 of 55 slices shown]
[im 1/55]
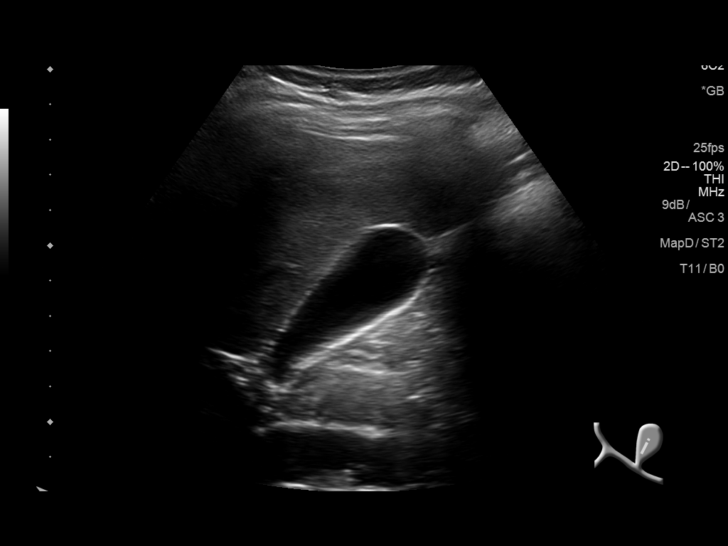
[im 5/55]
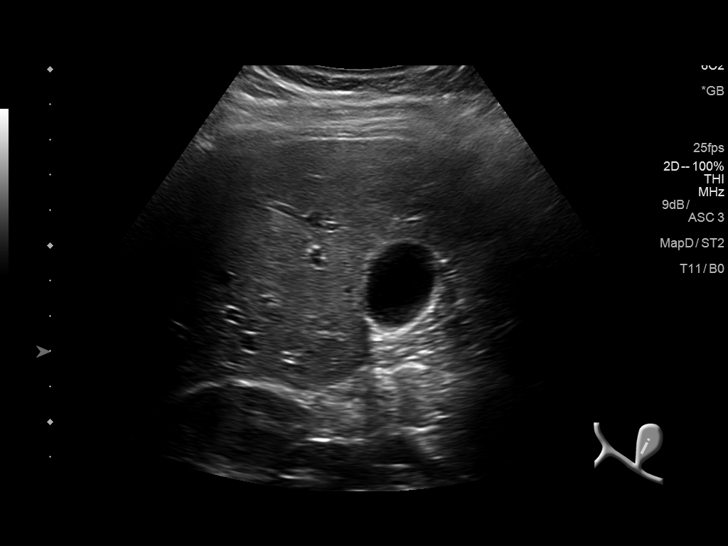
[im 10/55]
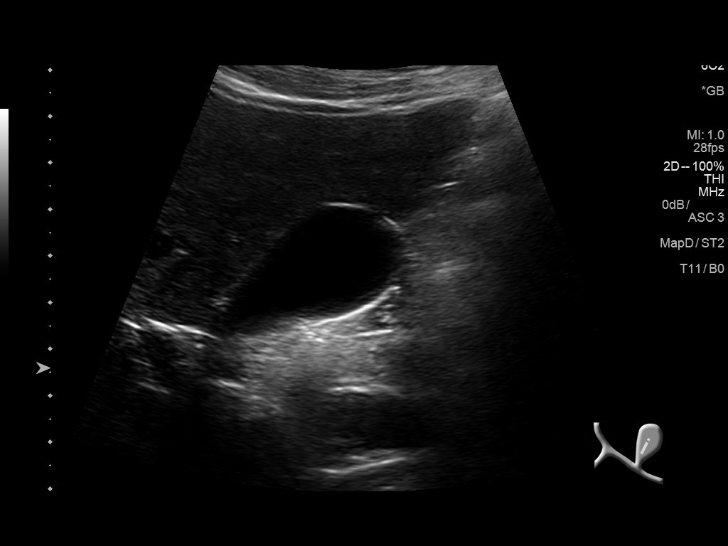
[im 14/55]
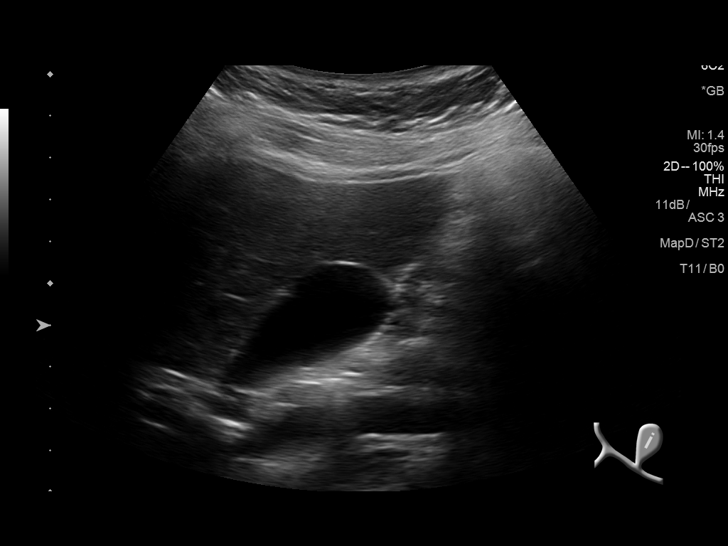
[im 19/55]
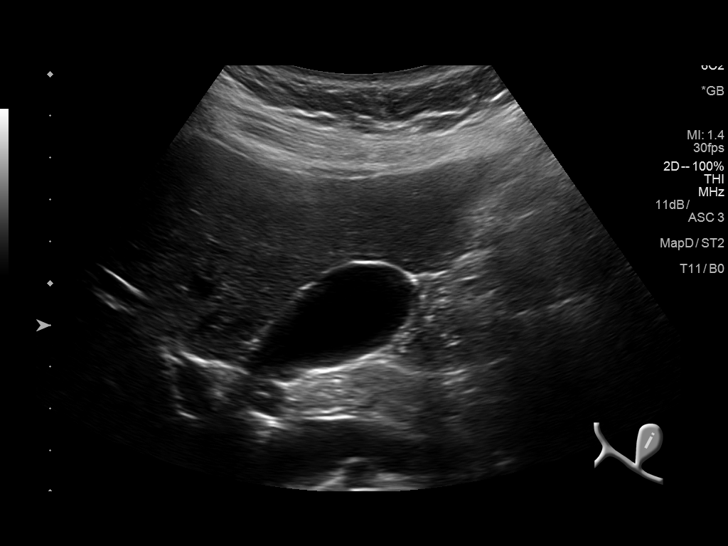
[im 21/55]
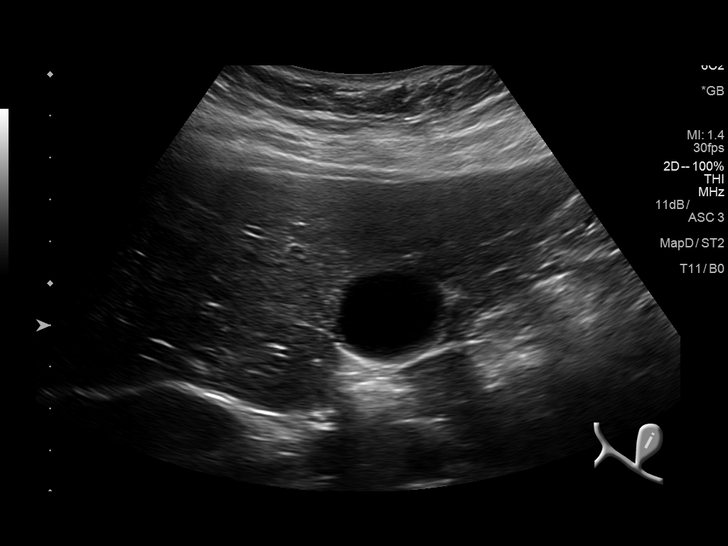
[im 25/55]
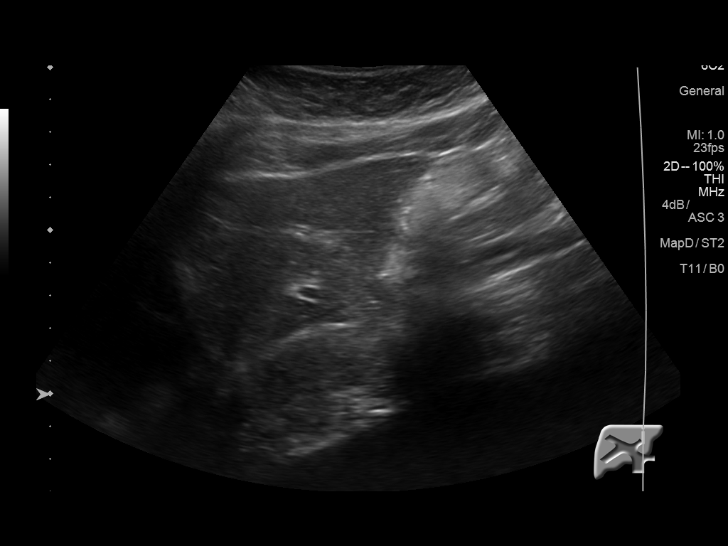
[im 30/55]
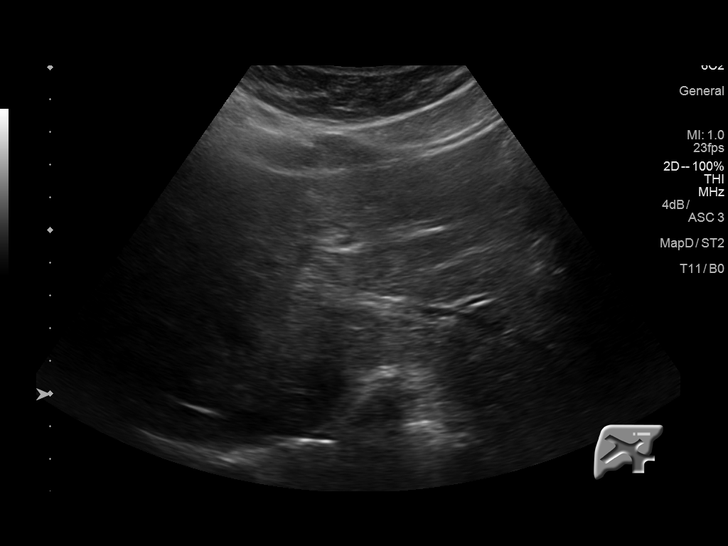
[im 34/55]
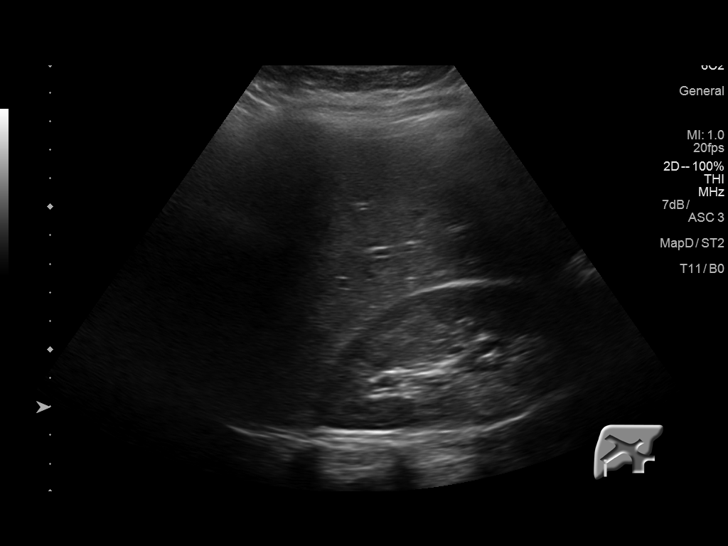
[im 37/55]
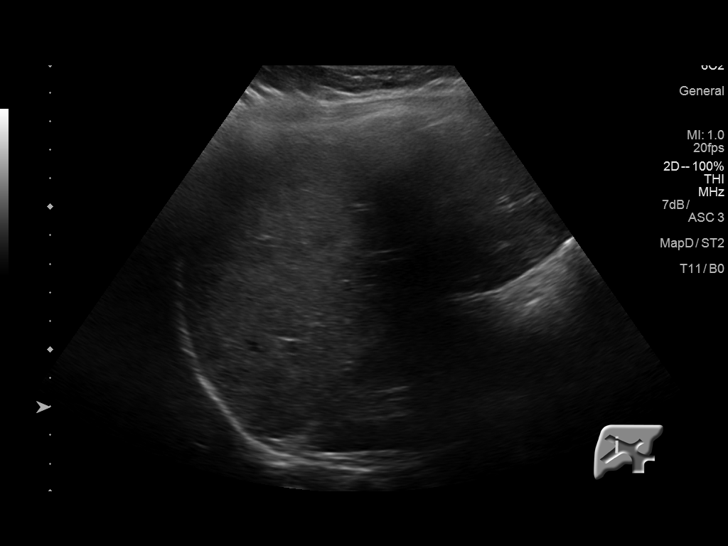
[im 41/55]
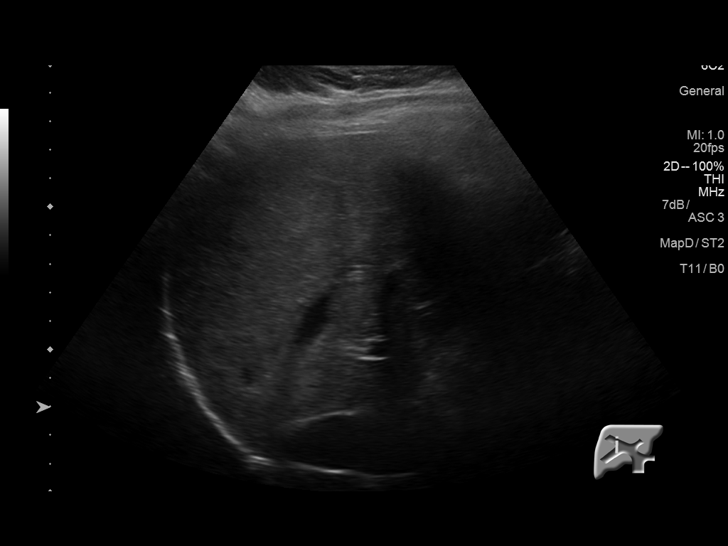
[im 46/55]
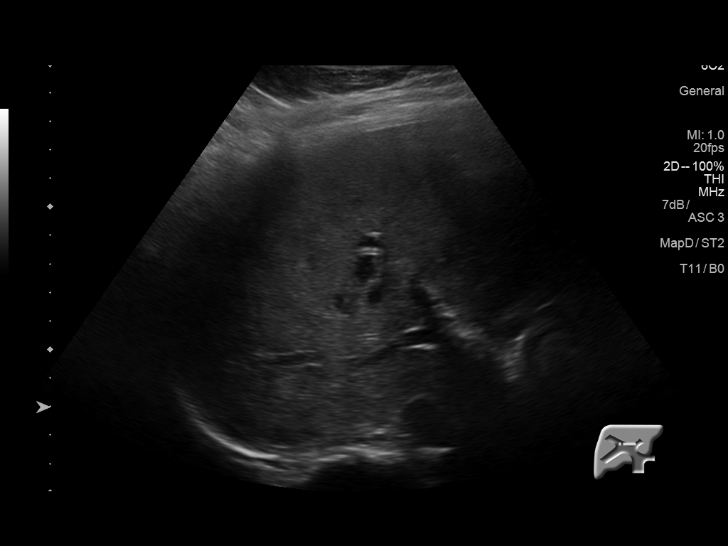
[im 50/55]
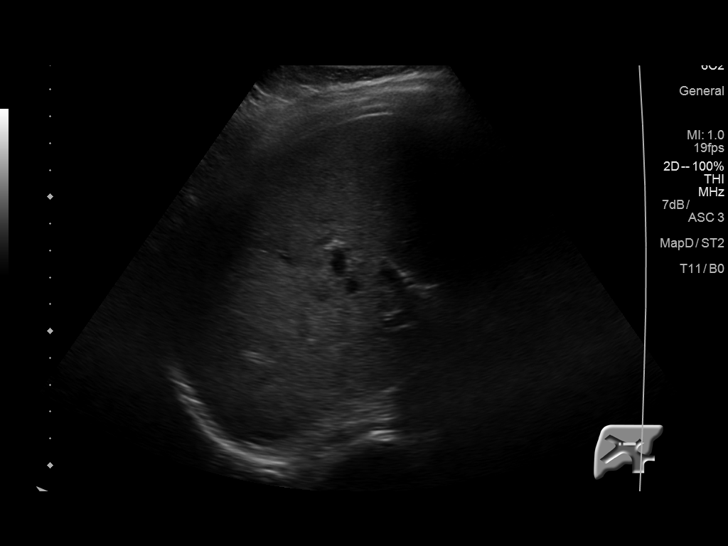
[im 55/55]
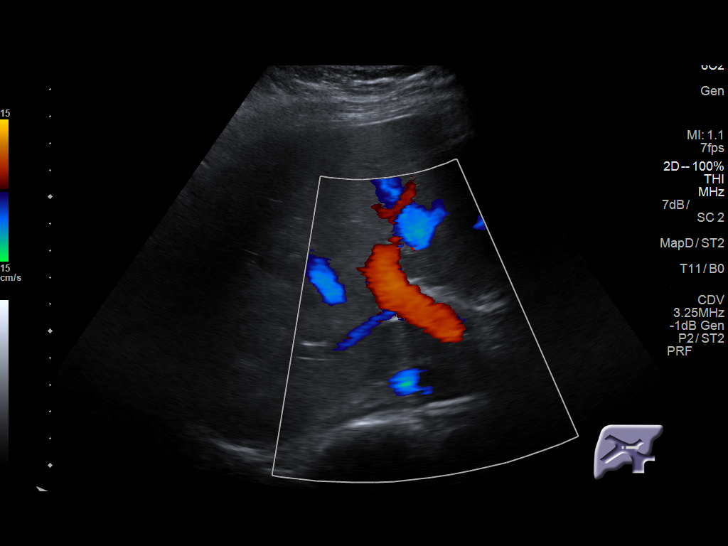

[14 of 25 positions shown; findings below may reference images not displayed]

FINDINGS: Gallbladder:

No gallstones or wall thickening visualized. No sonographic Murphy
sign noted by sonographer.

Common bile duct:

Diameter: 3 mm

Liver:

No focal lesion identified. Within normal limits in parenchymal
echogenicity.
IMPRESSION: Normal right upper quadrant ultrasound.

## 2017-06-07 ENCOUNTER — Encounter: Payer: Self-pay | Admitting: Obstetrics & Gynecology

## 2017-06-07 ENCOUNTER — Ambulatory Visit (INDEPENDENT_AMBULATORY_CARE_PROVIDER_SITE_OTHER): Payer: 59 | Admitting: Obstetrics & Gynecology

## 2017-06-07 VITALS — BP 138/80 | HR 77 | Ht 67.0 in | Wt 214.0 lb

## 2017-06-07 DIAGNOSIS — Z3202 Encounter for pregnancy test, result negative: Secondary | ICD-10-CM

## 2017-06-07 DIAGNOSIS — N938 Other specified abnormal uterine and vaginal bleeding: Secondary | ICD-10-CM

## 2017-06-07 LAB — POCT URINE PREGNANCY: PREG TEST UR: NEGATIVE

## 2017-06-07 NOTE — Progress Notes (Signed)
Chief Complaint  Patient presents with  . Follow-up      26 y.o. [id number] Patient's last menstrual period was 05/28/2017. The current method of family planning is none.  Outpatient Encounter Medications as of 06/07/2017  Medication Sig  . Cholecalciferol (VITAMIN D3) 5000 units TABS Take 2 tablets by mouth daily.   . clonazePAM (KLONOPIN) 0.5 MG tablet TK 1 T PO QD PRN SEVERE ANXIETY  . cyclobenzaprine (FLEXERIL) 10 MG tablet Take 1 tablet (10 mg total) by mouth 3 (three) times daily as needed for muscle spasms.  Marland Kitchen escitalopram (LEXAPRO) 10 MG tablet Take 20 mg daily as needed by mouth (for depression).   Marland Kitchen HYDROcodone-acetaminophen (NORCO/VICODIN) 5-325 MG tablet Take 1 tablet by mouth every 6 (six) hours as needed for moderate pain.  Marland Kitchen levothyroxine (SYNTHROID, LEVOTHROID) 75 MCG tablet Take 75 mcg by mouth daily.   Marland Kitchen linaclotide (LINZESS) 72 MCG capsule Take 72 mcg by mouth daily as needed (for constipation).  . naproxen (NAPROSYN) 500 MG tablet Take 500 mg as needed by mouth.   . pantoprazole (PROTONIX) 40 MG tablet Take 1 tablet (40 mg total) by mouth daily before breakfast. (Patient taking differently: Take 40 mg by mouth 2 (two) times daily. )   No facility-administered encounter medications on file as of 06/07/2017.     Subjective Patient had been seen in August for abnormal uterine bleeding which was felt to be due to dyssynchronous endometrium Placed around megestrol synchronization for 1 month and fortunately her last 2 periods have been normal Her bleeding has been normal volume normal timing and while she still has discomfort with her bleeding at site as she always has had She has no other complaints today Past Medical History:  Diagnosis Date  . Abnormal Pap smear   . Anemia   . Anxiety   . Depression   . Dysrhythmia    irregular heartrate  . GERD (gastroesophageal reflux disease)   . Headache(784.0)   . Hx of chlamydia infection   . Hypothyroidism   .  Ovarian cyst, right   . Uterine fibroid     Past Surgical History:  Procedure Laterality Date  . BIOPSY  11/14/2016   Procedure: BIOPSY;  Surgeon: Danie Binder, MD;  [facility];  Service: Endoscopy;;  duodenal gastric  . COLPOSCOPY    . ESOPHAGOGASTRODUODENOSCOPY (EGD) WITH PROPOFOL N/A 11/14/2016   Procedure: ESOPHAGOGASTRODUODENOSCOPY (EGD) WITH PROPOFOL;  Surgeon: Danie Binder, MD;  [facility];  Service: Endoscopy;  Laterality: N/A;  230   . lipoma surgery    . MASS EXCISION N/A 02/28/2017   Procedure: EXCISION LIPOMA SCALP 3CM;  Surgeon: Aviva Signs, MD;  Location: [facility];  Service: General;  Laterality: N/A;    OB History    Gravida Para Term Preterm AB Living   2 2 2  0 0 2   SAB TAB Ectopic Multiple Live Births   0 0 0 0 2      Allergies  Allergen Reactions  . Pollen Extract Cough  . Latex Rash  . Other Rash    Coconut causes a rash    Social History   Socioeconomic History  . Marital status: Married    Spouse name: None  . Number of children: None  . Years of education: None  . Highest education level: None  Social Needs  . Financial resource strain: None  . Food insecurity - worry: None  . Food insecurity - inability: None  .  Transportation needs - medical: None  . Transportation needs - non-medical: None  Occupational History  . Occupation: Production manager  Tobacco Use  . Smoking status: Never Smoker  . Smokeless tobacco: Never Used  Substance and Sexual Activity  . Alcohol use: Yes    Comment: occ  . Drug use: No  . Sexual activity: Yes    Partners: Male    Birth control/protection: None  Other Topics Concern  . None  Social History Narrative  . None    Family History  Problem Relation Age of Onset  . Diabetes Father   . Hypertension Father   . Other Son        reactive airway disease  . Asthma Son   . Heart murmur Son   . Cancer Maternal Grandmother        lung  . Heart disease Paternal Grandmother   . Liver  disease Paternal Grandfather        etoh related  . Colon cancer Neg Hx     Medications:       Current Outpatient Medications:  .  Cholecalciferol (VITAMIN D3) 5000 units TABS, Take 2 tablets by mouth daily. , Disp: , Rfl:  .  clonazePAM (KLONOPIN) 0.5 MG tablet, TK 1 T PO QD PRN SEVERE ANXIETY, Disp: , Rfl: 0 .  cyclobenzaprine (FLEXERIL) 10 MG tablet, Take 1 tablet (10 mg total) by mouth 3 (three) times daily as needed for muscle spasms., Disp: 30 tablet, Rfl: 0 .  escitalopram (LEXAPRO) 10 MG tablet, Take 20 mg daily as needed by mouth (for depression). , Disp: , Rfl: 3 .  HYDROcodone-acetaminophen (NORCO/VICODIN) 5-325 MG tablet, Take 1 tablet by mouth every 6 (six) hours as needed for moderate pain., Disp: 10 tablet, Rfl: 0 .  levothyroxine (SYNTHROID, LEVOTHROID) 75 MCG tablet, Take 75 mcg by mouth daily. , Disp: , Rfl: 2 .  linaclotide (LINZESS) 72 MCG capsule, Take 72 mcg by mouth daily as needed (for constipation)., Disp: , Rfl:  .  naproxen (NAPROSYN) 500 MG tablet, Take 500 mg as needed by mouth. , Disp: , Rfl: 1 .  pantoprazole (PROTONIX) 40 MG tablet, Take 1 tablet (40 mg total) by mouth daily before breakfast. (Patient taking differently: Take 40 mg by mouth 2 (two) times daily. ), Disp: 30 tablet, Rfl: 11 .  cephALEXin (KEFLEX) 500 MG capsule, Take 1 capsule (500 mg total) 2 (two) times daily by mouth., Disp: 14 capsule, Rfl: 0  Objective Blood pressure 138/80, pulse 77, height 5\' 7"  (1.702 m), weight 214 lb (97.1 kg), last menstrual period 05/28/2017, not currently breastfeeding.  Gen. well-developed well-nourished female in no acute distress  Pertinent ROS No burning with urination, frequency or urgency No nausea, vomiting or diarrhea Nor fever chills or other constitutional symptoms   Labs or studies     Impression Diagnoses this Encounter::   ICD-10-CM   1. AUB, endometrial origin, due to dyssynchronous endometrium N93.8    responded to megestrol  synchronization x 1 month, last 2 periods were normal  2. Pregnancy examination or test, negative result Z32.02 POCT urine pregnancy    Established relevant diagnosis(es):   Plan/Recommendations: No orders of the defined types were placed in this encounter.   Labs or Scans Ordered: Orders Placed This Encounter  Procedures  . POCT urine pregnancy    Management:: We will see how patient is doing nothing at this point If she has problems going forward she will certainly let me know She understands that this may  occur from time to time and if it does we will manage similarly She would like to become pregnant  Follow up Return if symptoms worsen or fail to improve.     All questions were answered.

## 2017-06-09 ENCOUNTER — Telehealth: Payer: 59 | Admitting: Nurse Practitioner

## 2017-06-09 DIAGNOSIS — N3000 Acute cystitis without hematuria: Secondary | ICD-10-CM | POA: Diagnosis not present

## 2017-06-09 MED ORDER — CEPHALEXIN 500 MG PO CAPS: 500.0000 mg | ORAL_CAPSULE | Freq: Two times a day (BID) | ORAL | 0 refills | Status: DC

## 2017-06-09 MED ORDER — CEPHALEXIN 500 MG PO CAPS: 500.0000 mg | ORAL_CAPSULE | Freq: Four times a day (QID) | ORAL | 0 refills | Status: DC

## 2017-06-09 NOTE — Progress Notes (Signed)

## 2017-06-09 NOTE — Addendum Note (Signed)
Addended by: Chevis Pretty on: 06/09/2017 11:16 AM   Modules accepted: Orders

## 2017-07-02 ENCOUNTER — Telehealth: Payer: 59 | Admitting: Nurse Practitioner

## 2017-07-02 DIAGNOSIS — B9789 Other viral agents as the cause of diseases classified elsewhere: Secondary | ICD-10-CM

## 2017-07-02 DIAGNOSIS — J069 Acute upper respiratory infection, unspecified: Secondary | ICD-10-CM | POA: Diagnosis not present

## 2017-07-02 NOTE — Progress Notes (Signed)

## 2017-07-03 DIAGNOSIS — E559 Vitamin D deficiency, unspecified: Secondary | ICD-10-CM | POA: Diagnosis not present

## 2017-07-03 DIAGNOSIS — E039 Hypothyroidism, unspecified: Secondary | ICD-10-CM | POA: Diagnosis not present

## 2017-07-13 ENCOUNTER — Encounter: Payer: Self-pay | Admitting: Gastroenterology

## 2017-07-13 ENCOUNTER — Telehealth: Payer: Self-pay | Admitting: Gastroenterology

## 2017-07-13 ENCOUNTER — Ambulatory Visit: Payer: 59 | Admitting: Gastroenterology

## 2017-07-13 NOTE — Telephone Encounter (Signed)
PATIENT WAS A NO SHOW AND LETTER SENT  °

## 2017-07-15 ENCOUNTER — Telehealth: Payer: 59 | Admitting: Nurse Practitioner

## 2017-07-15 DIAGNOSIS — N920 Excessive and frequent menstruation with regular cycle: Secondary | ICD-10-CM

## 2017-07-15 NOTE — Progress Notes (Signed)
Based on what you shared with me it looks like you have a serious condition that should be evaluated in a face to face office visit.  NOTE: Even if you have entered your credit card information for this eVisit, you will not be charged.   If you are having a true medical emergency please call 911.  If you need an urgent face to face visit, [place] has four urgent care centers for your convenience.  If you need care fast and have a high deductible or no insurance consider:   [email or web address]  [phone]  [address], [address] [place], [place] [address] 8 am to 8 pm Monday-Friday 10 am to 4 pm Saturday-Sunday   The following sites will take your  insurance:    . [facility]  [phone] Get Driving Directions Find a Provider at this Location  [address] [place], [place] [address] . 10 am to 8 pm Monday-Friday . 12 pm to 8 pm Saturday-Sunday   . [facility]  [phone] Get Driving Directions Find a Provider at this Location  [address], [address] [place], [place] [address] . 8 am to 8 pm Monday-Friday . 9 am to 6 pm Saturday . 11 am to 6 pm Sunday   .  Urgent Care at [facility]  [phone] Get Driving Directions  [address] [place], [place] [address] . 8 am to 8 pm Monday-Friday . 8 am to 4 pm Saturday-Sunday   Your e-visit answers were reviewed by a board certified advanced clinical practitioner to complete your personal care plan.  Thank you for using e-Visits.  

## 2017-09-20 DIAGNOSIS — R5383 Other fatigue: Secondary | ICD-10-CM | POA: Diagnosis not present

## 2017-09-20 DIAGNOSIS — Z6379 Other stressful life events affecting family and household: Secondary | ICD-10-CM | POA: Diagnosis not present

## 2017-09-20 DIAGNOSIS — E559 Vitamin D deficiency, unspecified: Secondary | ICD-10-CM | POA: Diagnosis not present

## 2017-09-20 DIAGNOSIS — E039 Hypothyroidism, unspecified: Secondary | ICD-10-CM | POA: Diagnosis not present

## 2017-10-30 DIAGNOSIS — Z713 Dietary counseling and surveillance: Secondary | ICD-10-CM | POA: Diagnosis not present

## 2017-10-30 DIAGNOSIS — K219 Gastro-esophageal reflux disease without esophagitis: Secondary | ICD-10-CM | POA: Diagnosis not present

## 2017-10-30 DIAGNOSIS — E039 Hypothyroidism, unspecified: Secondary | ICD-10-CM | POA: Diagnosis not present

## 2017-11-09 ENCOUNTER — Telehealth: Payer: 59 | Admitting: Family

## 2017-11-09 DIAGNOSIS — J301 Allergic rhinitis due to pollen: Secondary | ICD-10-CM

## 2017-11-09 DIAGNOSIS — R12 Heartburn: Secondary | ICD-10-CM

## 2017-11-09 MED ORDER — FLUTICASONE PROPIONATE 50 MCG/ACT NA SUSP: 2.0000 | Freq: Every day | NASAL | 2 refills | Status: DC

## 2017-11-09 NOTE — Progress Notes (Signed)
Thank you for the details you included in the comment boxes. Those details are very helpful in determining the best course of treatment for you and help [place] to provide the best care.  E visit for Allergic Rhinitis We are sorry that you are not feeling well.  Her is how we plan to help!  Based on what you have shared with me it looks like you have Allergic Rhinitis.  Rhinitis is when a reaction occurs that causes nasal congestion, runny nose, sneezing, and itching.  Most types of rhinitis are caused by an inflammation and are associated with symptoms in the eyes ears or throat. There are several types of rhinitis.  The most common are acute rhinitis, which is usually caused by a viral illness, allergic or seasonal rhinitis, and nonallergic or year-round rhinitis.  Nasal allergies occur certain times of the year.  Allergic rhinitis is caused when allergens in the air trigger the release of histamine in the body.  Histamine causes itching, swelling, and fluid to build up in the fragile linings of the nasal passages, sinuses and eyelids.  An itchy nose and clear discharge are common.  I recommend the following over the counter treatments: Xyzal 5 mg take 1 tablet daily  I also would recommend a nasal spray: I have prescribed as follows:  Flonase 2 sprays into each nostril once daily  You may also benefit from eye drops such as: Systane 1-2 driops each eye twice daily as needed  HOME CARE:   You can use an over-the-counter saline nasal spray as needed  Avoid areas where there is heavy dust, mites, or molds  Stay indoors on windy days during the pollen season  Keep windows closed in home, at least in bedroom; use air conditioner.  Use high-efficiency house air filter  Keep windows closed in car, turn AC on re-circulate  Avoid playing out with dog during pollen season  GET HELP RIGHT AWAY IF:   If your symptoms do not improve within 10 days  You become short of breath  You develop  yellow or green discharge from your nose for over 3 days  You have coughing fits  MAKE SURE YOU:   Understand these instructions  Will watch your condition  Will get help right away if you are not doing well or get worse  Thank you for choosing an e-visit. Your e-visit answers were reviewed by a board certified advanced clinical practitioner to complete your personal care plan. Depending upon the condition, your plan could have included both over the counter or prescription medications. Please review your pharmacy choice. Be sure that the pharmacy you have chosen is open so that you can pick up your prescription now.  If there is a problem you may message your provider in [place] to have the prescription routed to another pharmacy. Your safety is important to [place]. If you have drug allergies check your prescription carefully.  For the next 24 hours, you can use MyChart to ask questions about today's visit, request a non-urgent call back, or ask for a work or school excuse from your e-visit provider. You will get an email in the next two days asking about your experience. I hope that your e-visit has been valuable and will speed your recovery.

## 2017-11-09 NOTE — Progress Notes (Signed)
Thank you for the details you included in the comment boxes. Those details are very helpful in determining the best course of treatment for you and help [place] to provide the best care. I see you are taking the protonix twice daily now with 11 refills and have had numerous workups for GERD. Ideally, you should be seen face-to-face to determine if another scope/camera evaluation is necessary. This is beyond the scope of what we are able to assist with in the E-Visit program. The Protonix is actually stronger than what we offer through this program. You will not be charged for this visit.  Based on what you shared with me it looks like you have a serious condition that should be evaluated in a face to face office visit.  NOTE: If you entered your credit card information for this eVisit, you will not be charged. You may see a "hold" on your card for the $30 but that hold will drop off and you will not have a charge processed.  If you are having a true medical emergency please call 911.  If you need an urgent face to face visit, [place] has four urgent care centers for your convenience.  If you need care fast and have a high deductible or no insurance consider:   [email or web address] to reserve your spot online an avoid wait times  [facility] [address], [address] [place], [place] [address] 8 am to 8 pm Monday-Friday 10 am to 4 pm Saturday-Sunday *Across the street from [employer]  [place], [address] 8 am to 5 pm Monday-Friday * In the [facility] [place] on the [facility]   The following sites will take your  insurance:  . [facility] Urgent [place] a Provider at this Location  [address] [place], [place] [address] . 10 am to 8 pm Monday-Friday . 12 pm to 8 pm Saturday-Sunday   . [facility] Urgent Care at [place] a Provider at this Location  [address], [place], [place] [address] . 8 am to 8 pm Monday-Friday . 9 am to 6 pm Saturday . 11 am to 6 pm Sunday   . [facility] Urgent Care at [place] Get Driving Directions  [address] Suite [place], [place] [address] . 8 am to 8 pm Monday-Friday . 8 am to 4 pm Saturday-Sunday   Your e-visit answers were reviewed by a board certified advanced clinical practitioner to complete your personal care plan.  Thank you for using e-Visits.

## 2017-12-04 ENCOUNTER — Other Ambulatory Visit (HOSPITAL_COMMUNITY): Payer: Self-pay | Admitting: Internal Medicine

## 2017-12-04 DIAGNOSIS — E039 Hypothyroidism, unspecified: Secondary | ICD-10-CM | POA: Diagnosis not present

## 2017-12-04 DIAGNOSIS — E282 Polycystic ovarian syndrome: Secondary | ICD-10-CM | POA: Diagnosis not present

## 2017-12-04 DIAGNOSIS — R221 Localized swelling, mass and lump, neck: Secondary | ICD-10-CM | POA: Diagnosis not present

## 2017-12-07 ENCOUNTER — Ambulatory Visit (HOSPITAL_COMMUNITY)
Admission: RE | Admit: 2017-12-07 | Discharge: 2017-12-07 | Disposition: A | Discharge status: Home / Self Care | Payer: 59 | Source: Ambulatory Visit | Attending: Internal Medicine | Admitting: Internal Medicine

## 2017-12-07 ENCOUNTER — Other Ambulatory Visit (HOSPITAL_COMMUNITY): Payer: Self-pay | Admitting: Internal Medicine

## 2017-12-07 DIAGNOSIS — R221 Localized swelling, mass and lump, neck: Secondary | ICD-10-CM | POA: Diagnosis not present

## 2017-12-11 DIAGNOSIS — R1310 Dysphagia, unspecified: Secondary | ICD-10-CM | POA: Diagnosis not present

## 2017-12-11 DIAGNOSIS — E039 Hypothyroidism, unspecified: Secondary | ICD-10-CM | POA: Diagnosis not present

## 2017-12-11 DIAGNOSIS — E669 Obesity, unspecified: Secondary | ICD-10-CM | POA: Diagnosis not present

## 2017-12-11 DIAGNOSIS — E282 Polycystic ovarian syndrome: Secondary | ICD-10-CM | POA: Diagnosis not present

## 2018-01-03 ENCOUNTER — Other Ambulatory Visit: Payer: Self-pay | Admitting: Obstetrics & Gynecology

## 2018-01-03 ENCOUNTER — Telehealth: Payer: 59 | Admitting: Family

## 2018-01-03 ENCOUNTER — Encounter: Payer: Self-pay | Admitting: Obstetrics & Gynecology

## 2018-01-03 ENCOUNTER — Ambulatory Visit: Payer: 59 | Admitting: Obstetrics & Gynecology

## 2018-01-03 DIAGNOSIS — Z719 Counseling, unspecified: Secondary | ICD-10-CM

## 2018-01-03 MED ORDER — MEGESTROL ACETATE 40 MG PO TABS: ORAL_TABLET | ORAL | 3 refills | Status: DC

## 2018-01-03 NOTE — Progress Notes (Signed)
Thank you for the details you included in the comment boxes. Those details are very helpful in determining the best course of treatment for you and help [place] to provide the best care. Due to the fact that you may be pregnant, you must be seen in face for testing and a full exam for the safety of you and your baby.  Based on what you shared with me it looks like you have a serious condition that should be evaluated in a face to face office visit.  NOTE: If you entered your credit card information for this eVisit, you will not be charged. You may see a "hold" on your card for the $30 but that hold will drop off and you will not have a charge processed.  If you are having a true medical emergency please call 911.  If you need an urgent face to face visit, [place] has four urgent care centers for your convenience.  If you need care fast and have a high deductible or no insurance consider:   [email or web address] to reserve your spot online an avoid wait times  [facility] [address] [place], [place] [address] 8 am to 8 pm Monday-Friday 10 am to 4 pm Saturday-Sunday *Across the street from [employer]  [place], [address] 8 am to 5 pm Monday-Friday * In the [facility] on the [facility]   The following sites will take your  insurance:  . [facility] Urgent [place] a Provider at this Location  [address] [place], [place] [address] . 10 am to 8 pm Monday-Friday . 12 pm to 8 pm Saturday-Sunday   . [facility] Urgent Care at [place] a Provider at this Location  [address], [place], [place] [address] . 8 am to 8 pm Monday-Friday . 9 am to 6 pm Saturday . 11 am to 6 pm Sunday   . [facility] Urgent Care at [place] Get Driving Directions  [address] [place], [place] [address] . 8 am to 8 pm Monday-Friday . 8 am to 4 pm Saturday-Sunday   Your e-visit answers were reviewed by a board certified advanced clinical practitioner to complete your personal care plan.  Thank you for using e-Visits.

## 2018-01-04 ENCOUNTER — Encounter: Payer: Self-pay | Admitting: Obstetrics & Gynecology

## 2018-01-14 ENCOUNTER — Ambulatory Visit (INDEPENDENT_AMBULATORY_CARE_PROVIDER_SITE_OTHER): Payer: 59 | Admitting: Otolaryngology

## 2018-01-14 DIAGNOSIS — R1312 Dysphagia, oropharyngeal phase: Secondary | ICD-10-CM

## 2018-01-14 DIAGNOSIS — K219 Gastro-esophageal reflux disease without esophagitis: Secondary | ICD-10-CM | POA: Diagnosis not present

## 2018-01-15 ENCOUNTER — Other Ambulatory Visit (INDEPENDENT_AMBULATORY_CARE_PROVIDER_SITE_OTHER): Payer: Self-pay | Admitting: Otolaryngology

## 2018-01-15 DIAGNOSIS — R1310 Dysphagia, unspecified: Secondary | ICD-10-CM

## 2018-01-16 ENCOUNTER — Telehealth: Payer: 59 | Admitting: Family

## 2018-01-16 DIAGNOSIS — R21 Rash and other nonspecific skin eruption: Secondary | ICD-10-CM

## 2018-01-16 DIAGNOSIS — L219 Seborrheic dermatitis, unspecified: Secondary | ICD-10-CM

## 2018-01-16 MED ORDER — FLUOCINONIDE 0.05 % EX SOLN: 1.0000 "application " | Freq: Two times a day (BID) | CUTANEOUS | 0 refills | Status: DC

## 2018-01-16 MED ORDER — KETOCONAZOLE 2 % EX SHAM: 1.0000 "application " | MEDICATED_SHAMPOO | CUTANEOUS | 0 refills | Status: DC

## 2018-01-16 NOTE — Progress Notes (Signed)
E Visit for Rash  We are sorry that you are not feeling well. Here is how we plan to help!  It looks likes you have Seborrheic dermatitis of the scalp. I have sent in Fluocinonide topical that you will apply to your scalp twice a day and then I sent in Ketoconazole shampoo that you will apply to your scalp twice a week.    HOME CARE:   Take cool showers and avoid direct sunlight.  Apply cool compress or wet dressings.  Take a bath in an oatmeal bath.  Sprinkle content of one Aveeno packet under running faucet with comfortably warm water.  Bathe for 15-20 minutes, 1-2 times daily.  Pat dry with a towel. Do not rub the rash.  Use hydrocortisone cream.  Take an antihistamine like Benadryl for widespread rashes that itch.  The adult dose of Benadryl is 25-50 mg by mouth 4 times daily.  Caution:  This type of medication may cause sleepiness.  Do not drink alcohol, drive, or operate dangerous machinery while taking antihistamines.  Do not take these medications if you have prostate enlargement.  Read package instructions thoroughly on all medications that you take.  GET HELP RIGHT AWAY IF:   Symptoms don't go away after treatment.  Severe itching that persists.  If you rash spreads or swells.  If you rash begins to smell.  If it blisters and opens or develops a yellow-brown crust.  You develop a fever.  You have a sore throat.  You become short of breath.  MAKE SURE YOU:  Understand these instructions. Will watch your condition. Will get help right away if you are not doing well or get worse.  Thank you for choosing an e-visit. Your e-visit answers were reviewed by a board certified advanced clinical practitioner to complete your personal care plan. Depending upon the condition, your plan could have included both over the counter or prescription medications. Please review your pharmacy choice. Be sure that the pharmacy you have chosen is open so that you can pick up your  prescription now.  If there is a problem you may message your provider in [place] to have the prescription routed to another pharmacy. Your safety is important to [place]. If you have drug allergies check your prescription carefully.  For the next 24 hours, you can use MyChart to ask questions about today's visit, request a non-urgent call back, or ask for a work or school excuse from your e-visit provider. You will get an email in the next two days asking about your experience. I hope that your e-visit has been valuable and will speed your recovery.

## 2018-01-21 ENCOUNTER — Ambulatory Visit (HOSPITAL_COMMUNITY)
Admission: RE | Admit: 2018-01-21 | Discharge: 2018-01-21 | Disposition: A | Discharge status: Home / Self Care | Payer: 59 | Source: Ambulatory Visit | Attending: Otolaryngology | Admitting: Otolaryngology

## 2018-01-21 DIAGNOSIS — R1310 Dysphagia, unspecified: Secondary | ICD-10-CM | POA: Diagnosis not present

## 2018-01-22 IMAGING — DX DG CHEST 2V
2 series · 2 of 2 positions shown · non-contrast
Comparison: 12/21/2016

CLINICAL DATA: Bilateral upper chest pain radiating to the back for
over a year.

EXAM:
CHEST  2 VIEW

[chest pa]
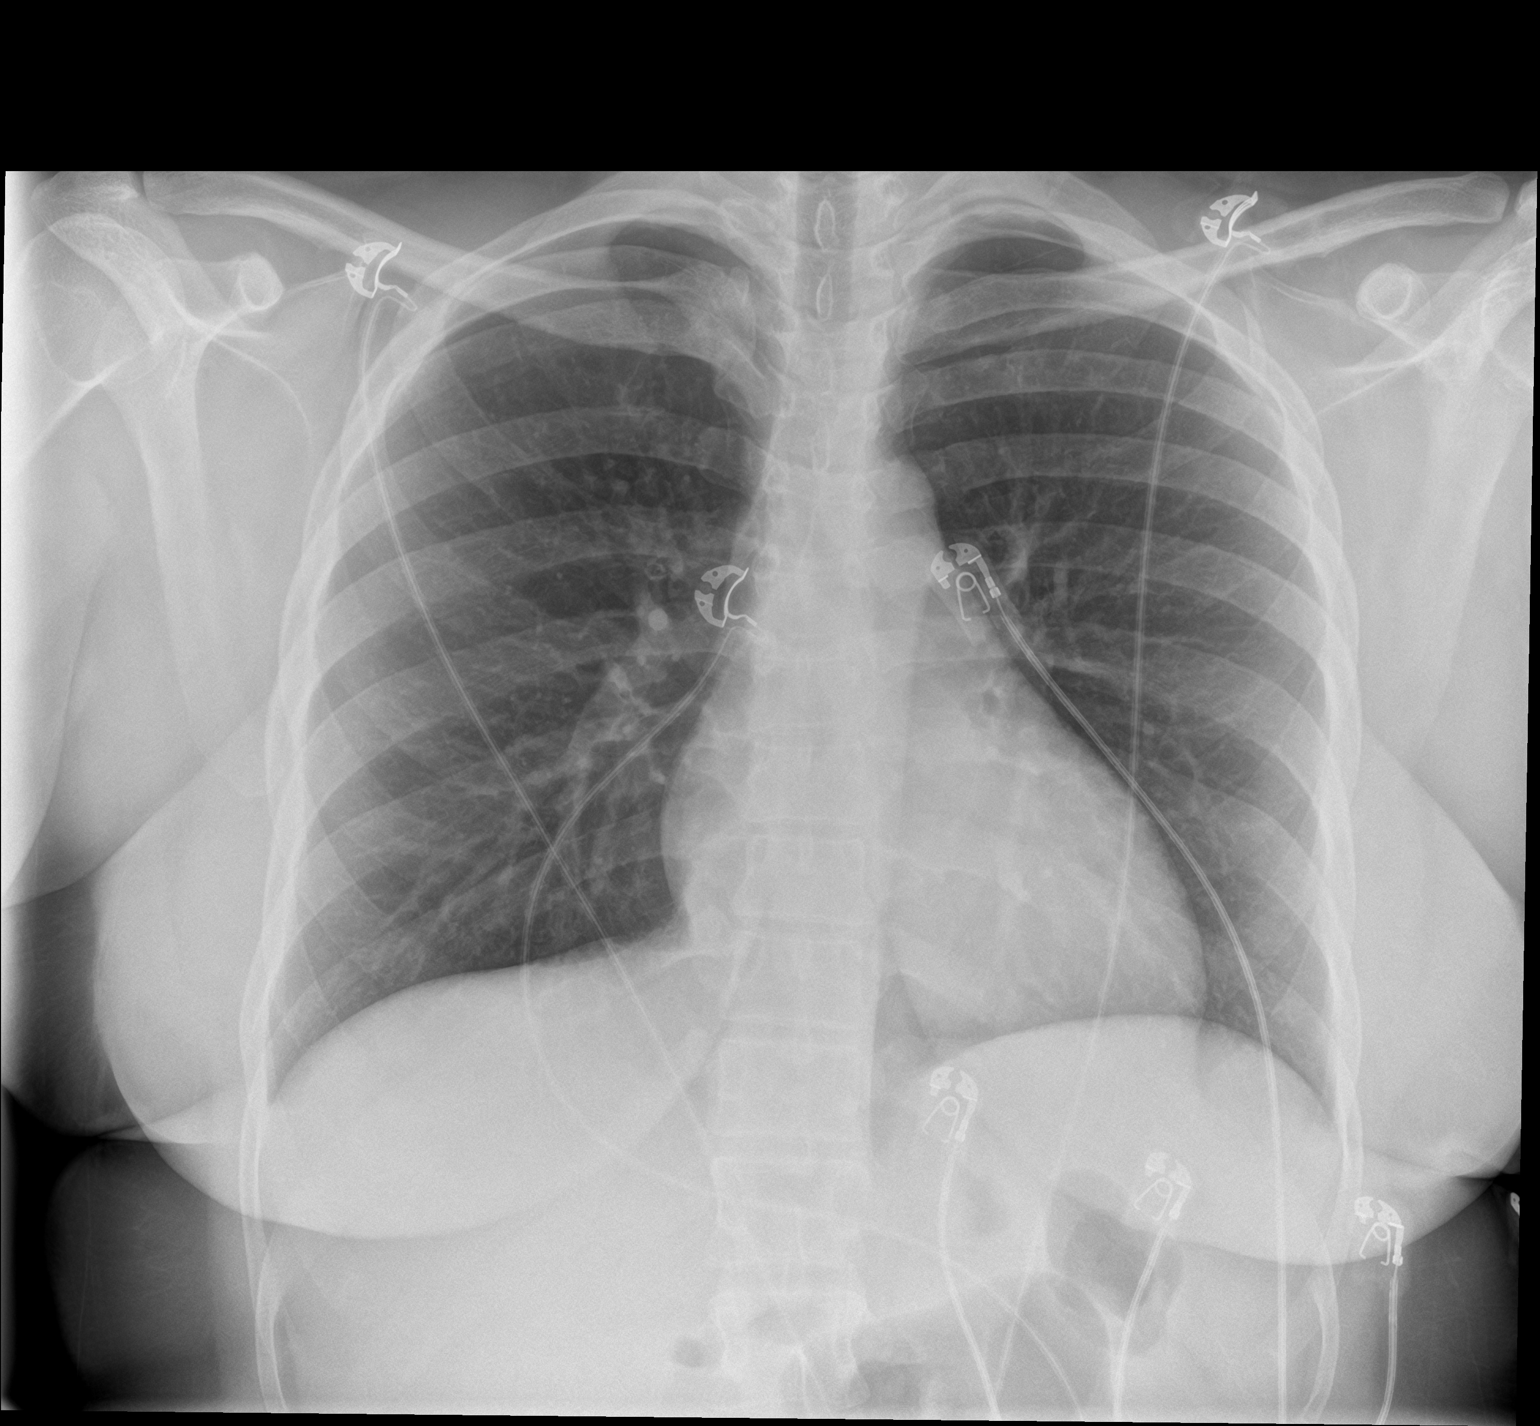

[chest lat]
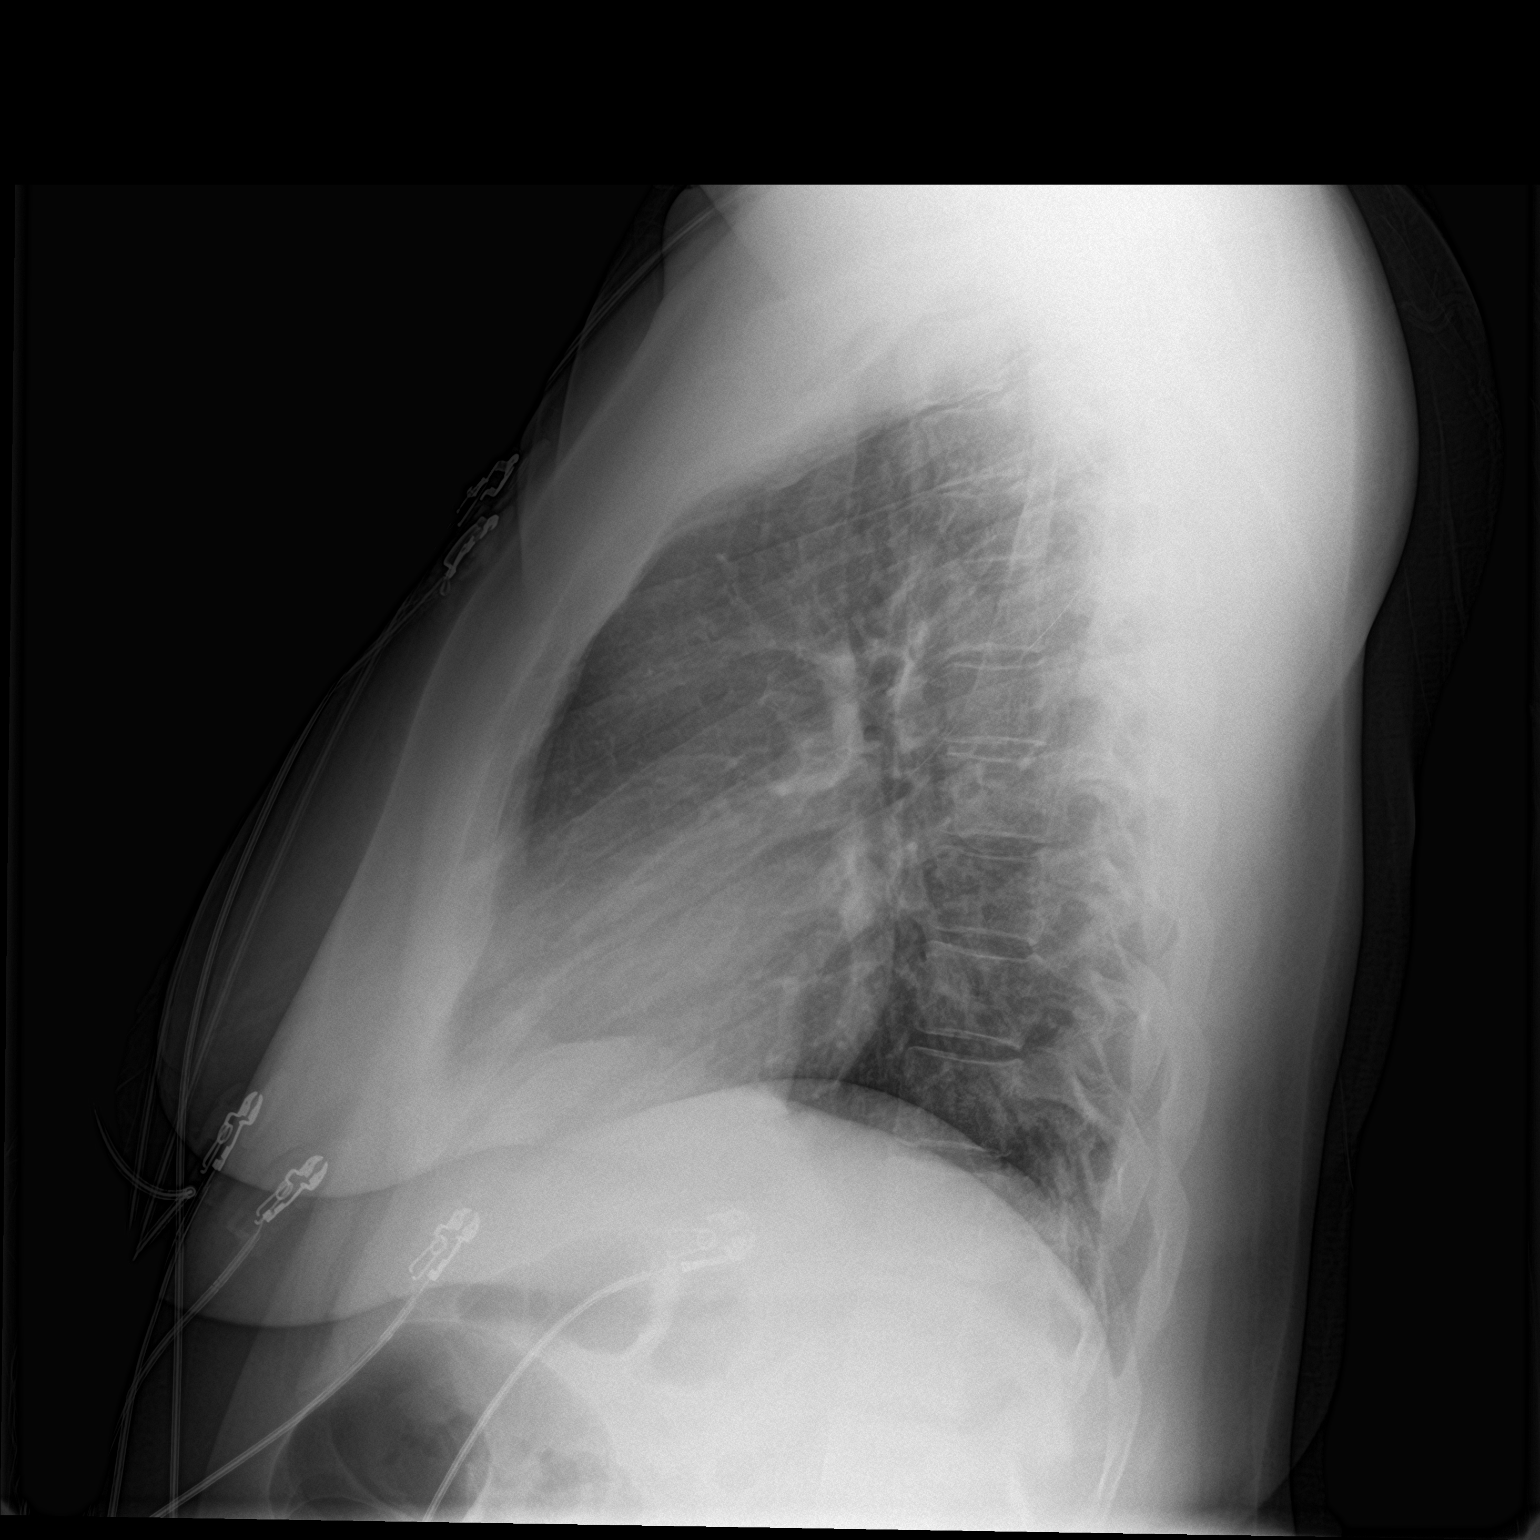

[2 of 2 positions shown; findings below may reference images not displayed]

FINDINGS: The heart size and mediastinal contours are within normal limits. No
aortic aneurysm or mediastinal widening. No pneumomediastinum. No
pneumothorax. Both lungs are clear. The visualized skeletal
structures are unremarkable.
IMPRESSION: No active cardiopulmonary disease.

## 2018-02-01 ENCOUNTER — Emergency Department (HOSPITAL_COMMUNITY)
Admission: EM | Admit: 2018-02-01 | Discharge: 2018-02-01 | Disposition: A | Discharge status: Home / Self Care | Payer: 59 | Attending: Emergency Medicine | Admitting: Emergency Medicine

## 2018-02-01 ENCOUNTER — Encounter (HOSPITAL_COMMUNITY): Payer: Self-pay | Admitting: Emergency Medicine

## 2018-02-01 ENCOUNTER — Other Ambulatory Visit: Payer: Self-pay

## 2018-02-01 ENCOUNTER — Emergency Department (HOSPITAL_COMMUNITY): Payer: 59

## 2018-02-01 DIAGNOSIS — Z91040 Latex allergy status: Secondary | ICD-10-CM | POA: Diagnosis not present

## 2018-02-01 DIAGNOSIS — E039 Hypothyroidism, unspecified: Secondary | ICD-10-CM | POA: Insufficient documentation

## 2018-02-01 DIAGNOSIS — Z79899 Other long term (current) drug therapy: Secondary | ICD-10-CM | POA: Diagnosis not present

## 2018-02-01 DIAGNOSIS — R079 Chest pain, unspecified: Secondary | ICD-10-CM | POA: Diagnosis not present

## 2018-02-01 DIAGNOSIS — R0789 Other chest pain: Secondary | ICD-10-CM | POA: Diagnosis not present

## 2018-02-01 LAB — BASIC METABOLIC PANEL
Anion gap: 8 (ref 5–15)
BUN: 10 mg/dL (ref 6–20)
CHLORIDE: 107 mmol/L (ref 98–111)
CO2: 24 mmol/L (ref 22–32)
Calcium: 9.3 mg/dL (ref 8.9–10.3)
Creatinine, Ser: 0.69 mg/dL (ref 0.44–1.00)
GFR calc Af Amer: >60 mL/min (ref >60)
Glucose, Bld: 97 mg/dL (ref 70–99)
Potassium: 4 mmol/L (ref 3.5–5.1)
SODIUM: 139 mmol/L (ref 135–145)

## 2018-02-01 LAB — CBC
HCT: 32.9 % — ABNORMAL LOW (ref 36.0–46.0)
Hemoglobin: 10.1 g/dL — ABNORMAL LOW (ref 12.0–15.0)
MCH: 25.1 pg — ABNORMAL LOW (ref 26.0–34.0)
MCHC: 30.7 g/dL (ref 30.0–36.0)
MCV: 81.8 fL (ref 78.0–100.0)
PLATELETS: 256 10*3/uL (ref 150–400)
RBC: 4.02 MIL/uL (ref 3.87–5.11)
RDW: 14.8 % (ref 11.5–15.5)
WBC: 5.7 10*3/uL (ref 4.0–10.5)

## 2018-02-01 LAB — I-STAT BETA HCG BLOOD, ED (MC, WL, AP ONLY)

## 2018-02-01 LAB — I-STAT TROPONIN, ED: Troponin i, poc: 0 ng/mL (ref 0.00–0.08)

## 2018-02-01 MED ORDER — HYDROXYZINE HCL 25 MG PO TABS: 25.0000 mg | ORAL_TABLET | Freq: Four times a day (QID) | ORAL | 0 refills | Status: DC | PRN

## 2018-02-01 NOTE — ED Triage Notes (Signed)
Pt. Stated, Ive had chest pain for 3 years off and on. It also goes to my back.

## 2018-02-01 NOTE — Discharge Instructions (Addendum)
Start taking hydroxyzine as needed for anxiety or pains.  Do not take Klonopin while you are taking this medication.  Continue taking your home medications.  Avoid spicy foods, fried foods, alcohol,  Or acidic foods that may upset your stomach.  Follow-up with your primary care physician, gastroneurologist, and a cardiologist for reevaluation of your symptoms.  Return to the emergency department if any concerning signs or symptoms develop such as unilateral leg swelling, worsening chest pains, persistent shortness of breath or pain that worsens with deep breaths, or fevers.

## 2018-02-01 NOTE — ED Provider Notes (Signed)
[facility] EMERGENCY DEPARTMENT Provider Note   CSN: [id number] Arrival date & time: 02/01/18  1011     History   Chief Complaint Chief Complaint  Patient presents with  . Chest Pain  . Back Pain    HPI Emily Morales is a 27 y.o. female with history of anxiety, depression, GERD, hypothyroidism presents for evaluation of intermittent chest pains for 3 years.  States that it has been more persistent for the past 3 days or so.  Pain is described as a sharp stabbing sensation typically midline and will radiate up to the neck anteriorly, sometimes radiates to the shoulders bilaterally.  States it typically worsens when she is stressed and sometimes improves when she takes her Protonix and Ranitidine.  She was also prescribed Klonopin for anxiety and states that sometimes improves with Klonopin.  Pain will typically last around 5 minutes in duration before resolving.  It is not pleuritic or exertional in nature.  States she has been experiencing it around 10 times daily for the past 3 days.  It is sometimes associated with shortness of breath when it is very severe but denies nausea, vomiting, lightheadedness, diaphoresis, or syncope.  She states "I have had EKG after EKG, HIDA scan, EGD and no one can seem to find out what is wrong with me.  They think it might be anxiety related ".  She states that she would just like some answers.  She is a non-smoker, denies recreational drug use or excessive alcohol intake.  Did recently travel to [facility], 10[place]5 hour drive.  She states they did take multiple breaks to walk during the drive.  Notes intermittent swelling of the feet for the past month.  Denies significant pain.  No recent surgeries, no hemoptysis, no prior history of DVT or PE.  She is on progesterone for birth control.  No chest pain at present.   The history is provided by the patient.    Past Medical History:  Diagnosis Date  . Abnormal Pap smear   .  Anemia   . Anxiety   . Depression   . Dysrhythmia    irregular heartrate  . GERD (gastroesophageal reflux disease)   . Headache(784.0)   . Hx of chlamydia infection   . Hypothyroidism   . Ovarian cyst, right   . Uterine fibroid     Patient Active Problem List   Diagnosis Date Noted  . Atypical chest pain 05/08/2017  . Benign lipomatous neoplasm of skin and subcutaneous tissue of head, face and neck   . Dyspepsia   . Right flank pain 11/07/2016  . Anemia 11/07/2016  . GERD (gastroesophageal reflux disease) 11/07/2016  . Constipation 11/07/2016  . Encounter for IUD insertion liletta 12/10/2015  . Mild postpartum depression 11/17/2015  . Umbilical hernia without obstruction and without gangrene 09/08/2015  . Oligouria 08/17/2015  . Mild dysplasia of cervix 03/26/2014    Past Surgical History:  Procedure Laterality Date  . BIOPSY  11/14/2016   Procedure: BIOPSY;  Surgeon: Danie Binder, MD;  Location: [facility];  Service: Endoscopy;;  duodenal gastric  . COLPOSCOPY    . ESOPHAGOGASTRODUODENOSCOPY (EGD) WITH PROPOFOL N/A 11/14/2016   Procedure: ESOPHAGOGASTRODUODENOSCOPY (EGD) WITH PROPOFOL;  Surgeon: Danie Binder, MD;  Location: [facility];  Service: Endoscopy;  Laterality: N/A;  230   . lipoma surgery    . MASS EXCISION N/A 02/28/2017   Procedure: EXCISION LIPOMA SCALP 3CM;  Surgeon: Aviva Signs, MD;  Location: [facility]S;  Service: General;  Laterality: N/A;     OB History    Gravida  2   Para  2   Term  2   Preterm  0   AB  0   Living  2     SAB  0   TAB  0   Ectopic  0   Multiple  0   Live Births  2            Home Medications    Prior to Admission medications   Medication Sig Start Date End Date Taking? Authorizing Provider  cephALEXin (KEFLEX) 500 MG capsule Take 1 capsule (500 mg total) 2 (two) times daily by mouth. 06/09/17   Hassell Done, Mary-Margaret, FNP  Cholecalciferol (VITAMIN D3) 5000 units TABS Take 2 tablets by mouth daily.      [provider]  clonazePAM (KLONOPIN) 0.5 MG tablet TK 1 T PO QD PRN SEVERE ANXIETY 05/29/17   [provider]  cyclobenzaprine (FLEXERIL) 10 MG tablet Take 1 tablet (10 mg total) by mouth 3 (three) times daily as needed for muscle spasms. 02/17/17   Withrow, Elyse Jarvis, FNP  escitalopram (LEXAPRO) 10 MG tablet Take 20 mg daily as needed by mouth (for depression).  10/11/16   [provider]  fluocinonide (LIDEX) 0.05 % external solution Apply 1 application topically 2 (two) times daily. 01/16/18   Sharion Balloon, FNP  fluticasone (FLONASE) 50 MCG/ACT nasal spray Place 2 sprays into both nostrils daily. 11/09/17   Withrow, Elyse Jarvis, FNP  HYDROcodone-acetaminophen (NORCO/VICODIN) 5-325 MG tablet Take 1 tablet by mouth every 6 (six) hours as needed for moderate pain. 05/28/17   Milton Ferguson, MD  hydrOXYzine (ATARAX/VISTARIL) 25 MG tablet Take 1 tablet (25 mg total) by mouth every 6 (six) hours as needed for anxiety. 02/01/18   Katurah Karapetian A, PA-C  ketoconazole (NIZORAL) 2 % shampoo Apply 1 application topically 2 (two) times a week. 01/17/18   Sharion Balloon, FNP  levothyroxine (SYNTHROID, LEVOTHROID) 75 MCG tablet Take 75 mcg by mouth daily.  11/29/16   [provider]  linaclotide (LINZESS) 72 MCG capsule Take 72 mcg by mouth daily as needed (for constipation).    [provider]  megestrol (MEGACE) 40 MG tablet 3 tablets a day for 5 days, 2 tablets a day for 5 days then 1 tablet daily 01/03/18   Florian Buff, MD  naproxen (NAPROSYN) 500 MG tablet Take 500 mg as needed by mouth.  02/28/17   [provider]  pantoprazole (PROTONIX) 40 MG tablet Take 1 tablet (40 mg total) by mouth daily before breakfast. Patient taking differently: Take 40 mg by mouth 2 (two) times daily.  11/07/16   Mahala Menghini, PA-C    Family History Family History  Problem Relation Age of Onset  . Diabetes Father   . Hypertension Father   . Other Son        reactive airway  disease  . Asthma Son   . Heart murmur Son   . Cancer Maternal Grandmother        lung  . Heart disease Paternal Grandmother   . Liver disease Paternal Grandfather        etoh related  . Colon cancer Neg Hx     Social History Social History   Tobacco Use  . Smoking status: Never Smoker  . Smokeless tobacco: Never Used  Substance Use Topics  . Alcohol use: Yes    Comment:  occ  . Drug use: No     Allergies   Pollen extract; Latex; and Other   Review of Systems Review of Systems  Constitutional: Negative for chills, diaphoresis and fever.  Respiratory: Negative for shortness of breath.   Cardiovascular: Positive for chest pain.  Gastrointestinal: Negative for nausea and vomiting.  Musculoskeletal: Positive for back pain.  All other systems reviewed and are negative.    Physical Exam Updated Vital Signs BP 114/77   Pulse 77   Temp 98.7 F (37.1 C) (Oral)   Resp 16   Ht 5' 5.5" (1.664 m)   Wt 93 kg (205 lb)   LMP 01/07/2018   SpO2 100%   BMI 33.59 kg/m   Physical Exam  Constitutional: She appears well-developed and well-nourished. No distress.  Resting comfortably in bed  HENT:  Head: Normocephalic and atraumatic.  Eyes: Conjunctivae are normal. Right eye exhibits no discharge. Left eye exhibits no discharge.  Neck: Normal range of motion. Neck supple. No JVD present. No tracheal deviation present.  Cardiovascular: Normal rate and regular rhythm.  Pulses:      Carotid pulses are 2+ on the right side, and 2+ on the left side.      Radial pulses are 2+ on the right side, and 2+ on the left side.       Dorsalis pedis pulses are 2+ on the right side, and 2+ on the left side.       Posterior tibial pulses are 2+ on the right side, and 2+ on the left side.  2+ radial and DP/PT pulses bilaterally, Homans sign absent bilaterally, no lower extremity edema, no palpable cords, compartments are soft. Very mild tenderness to palpation of the right calf.      Pulmonary/Chest: Effort normal and breath sounds normal. No tachypnea. No respiratory distress.  Equal rise and fall of chest, no increased work of breathing, speaking in full sentences without difficulty.  No tenderness to palpation of the chest wall.  Abdominal: Soft. Bowel sounds are normal. She exhibits no distension. There is no tenderness.  Musculoskeletal: She exhibits no edema.       Right lower leg: Normal. She exhibits no tenderness and no edema.       Left lower leg: Normal. She exhibits no tenderness and no edema.  Neurological: She is alert.  Skin: Skin is warm and dry. No erythema.  Psychiatric: She has a normal mood and affect. Her behavior is normal.  Nursing note and vitals reviewed.    ED Treatments / Results  Labs (all labs ordered are listed, but only abnormal results are displayed) Labs Reviewed  CBC - Abnormal; Notable for the following components:      Result Value   Hemoglobin 10.1 (*)    HCT 32.9 (*)    MCH 25.1 (*)    All other components within normal limits  BASIC METABOLIC PANEL  I-STAT TROPONIN, ED  I-STAT BETA HCG BLOOD, ED ([facility], [facility], [facility] ONLY)    EKG EKG Interpretation  Date/Time:  Friday February 01 2018 10:16:19 EDT Ventricular Rate:  88 PR Interval:  154 QRS Duration: 80 QT Interval:  374 QTC Calculation: 452 R Axis:   12 Text Interpretation:  Normal sinus rhythm Normal ECG since last tracing no significant change Confirmed by Malvin Johns [phone]) on 02/01/2018 11:43:01 AM   Radiology Dg Chest 2 View  Result Date: 02/01/2018 CLINICAL DATA:  Chest pain EXAM: CHEST - 2 VIEW COMPARISON:  05/28/2017 FINDINGS: Heart and mediastinal contours  are within normal limits. No focal opacities or effusions. No acute bony abnormality. IMPRESSION: No active cardiopulmonary disease. Electronically Signed   By: Rolm Baptise M.D.   On: 02/01/2018 10:40    Procedures Procedures (including critical care time)  Medications Ordered in ED Medications - No data  to display   Initial Impression / Assessment and Plan / ED Course  I have reviewed the triage vital signs and the nursing notes.  Pertinent labs & imaging results that were available during my care of the patient were reviewed by me and considered in my medical decision making (see chart for details).     Patient presents with complaint of intermittent atypical sounding chest pain for 3 years.  She is afebrile, vital signs are stable.  She is nontoxic in appearance.  EKG shows normal sinus rhythm, no ischemic changes and no significant changes from last tracing.  Initial troponin is normal.  Remainder of lab work reviewed by me shows mild stable anemia, no electrolyte abnormalities.  Chest x-ray shows no acute cardiopulmonary abnormalities.  No evidence of pericarditis, myocarditis, dissection, CHF, or pneumonia.  She tells me that she is frustrated that she has not found a diagnosis to explain her symptoms.  She is being managed by a gastroneurologist in her PCP but has not been seen by a cardiologist yet.  She did recently travel and has mild right calf tenderness but no significant swelling, compartments are soft.She is extremely low risk for DVT/PE but I did offer her ultrasound to assess for possible DVT.  She declines at this time and states that she does not think she has a blood clot in her leg.  I think this is reasonable given the absence of significant swelling and no complaint of pleuritic chest pain or shortness of breath. She is not tachycardic, hypoxic, or tachypneic.Marland Kitchen  Her HEART score is 1, I doubt ACS or MI.  She tells me "I think this may be coming from my anxiety.  I am having some marital problems and I think it is making it worse ".  We discussed the possibility of trialing hydroxyzine for her anxiety and discontinuing her Klonopin and she is open to this.  She is currently in counseling.  Denies suicidal or homicidal ideation.  She will follow-up with her PCP and a cardiologist for  reevaluation of her symptoms.  Discussed strict ED return precautions. Pt verbalized understanding of and agreement with plan and is safe for discharge home at this time.  She has no complaints prior to discharge. Final Clinical Impressions(s) / ED Diagnoses   Final diagnoses:  Atypical chest pain    ED Discharge Orders        Ordered    hydrOXYzine (ATARAX/VISTARIL) 25 MG tablet  Every 6 hours PRN     02/01/18 1218       Renita Papa, PA-C 02/01/18 1228    Malvin Johns, MD 02/01/18 [phone]

## 2018-02-07 ENCOUNTER — Encounter: Payer: Self-pay | Admitting: Cardiovascular Disease

## 2018-02-07 ENCOUNTER — Ambulatory Visit (INDEPENDENT_AMBULATORY_CARE_PROVIDER_SITE_OTHER): Payer: 59 | Admitting: Cardiovascular Disease

## 2018-02-07 VITALS — BP 118/82 | HR 97 | Ht 65.0 in | Wt 214.0 lb

## 2018-02-07 DIAGNOSIS — F419 Anxiety disorder, unspecified: Secondary | ICD-10-CM | POA: Diagnosis not present

## 2018-02-07 DIAGNOSIS — F329 Major depressive disorder, single episode, unspecified: Secondary | ICD-10-CM | POA: Diagnosis not present

## 2018-02-07 DIAGNOSIS — F32A Depression, unspecified: Secondary | ICD-10-CM

## 2018-02-07 DIAGNOSIS — R0789 Other chest pain: Secondary | ICD-10-CM | POA: Diagnosis not present

## 2018-02-07 NOTE — Patient Instructions (Signed)
Medication Instructions:  Your physician recommends that you continue on your current medications as directed. Please refer to the Current Medication list given to you today.   Labwork: none  Testing/Procedures: none  Follow-Up: Your physician recommends that you schedule a follow-up appointment in: AS NEEDED    Any Other Special Instructions Will Be Listed Below (If Applicable).     If you need a refill on your cardiac medications before your next appointment, please call your pharmacy.   

## 2018-02-07 NOTE — Progress Notes (Signed)
CARDIOLOGY CONSULT NOTE  Patient ID: Emily Morales MRN: [id number] DOB/AGE: 25-Feb-1991 27 y.o.  Admit date: (Not on file) Primary Physician: Emily Albee, MD Referring Physician: Doree Albee, MD  Reason for Consultation: Chest pain  HPI: Emily Morales is a 27 y.o. female who is being seen today for the evaluation of chest pain at the request of Emily Albee, MD.   Past medical history includes GERD and hypothyroidism.  She was evaluated in the ED for chest pain on 02/01/2018.  I personally reviewed all relevant documentation, labs, and studies.  She has apparently had an EGD and HIDA scan which were unremarkable.  She underwent a normal barium swallow on 01/21/2018.  Hemoglobin was mildly low at 10.1.  Troponins were normal.  Basic metabolic panel was normal.  Chest x-ray showed no acute cardiopulmonary abnormalities.  I personally reviewed the ECG which was normal.  She underwent a chest CT which I reviewed performed on 01/28/2016 which was also normal.  She has had symptoms ever since she gave birth to her son which was about 2-1/2 years ago.  She is tried Klonopin but it did not help.  She has been on Protonix for the past year and this has not helped to alleviate her symptoms.  She told me that she struggles with anxiety and depression.  She has been on Zoloft in the past but that did not help.  Social history: Her grandmother is Emily Morales who is also my patient.    Allergies  Allergen Reactions  . Pollen Extract Cough  . Latex Rash  . Other Rash    Coconut causes a rash    Current Outpatient Medications  Medication Sig Dispense Refill  . Cholecalciferol (VITAMIN D3) 5000 units TABS Take 2 tablets by mouth daily.     . clonazePAM (KLONOPIN) 0.5 MG tablet TK 1 T PO QD PRN SEVERE ANXIETY  0  . hydrOXYzine (ATARAX/VISTARIL) 25 MG tablet Take 1 tablet (25 mg total) by mouth every 6 (six) hours as needed for anxiety. 12 tablet 0  .  ketoconazole (NIZORAL) 2 % shampoo Apply 1 application topically 2 (two) times a week. 120 mL 0  . megestrol (MEGACE) 40 MG tablet 3 tablets a day for 5 days, 2 tablets a day for 5 days then 1 tablet daily 45 tablet 3  . pantoprazole (PROTONIX) 40 MG tablet Take 1 tablet (40 mg total) by mouth daily before breakfast. (Patient taking differently: Take 40 mg by mouth 2 (two) times daily. ) 30 tablet 11  . thyroid (ARMOUR) 120 MG tablet Take 120 mg by mouth daily before breakfast.     No current facility-administered medications for this visit.     Past Medical History:  Diagnosis Date  . Abnormal Pap smear   . Anemia   . Anxiety   . Depression   . Dysrhythmia    irregular heartrate  . GERD (gastroesophageal reflux disease)   . Headache(784.0)   . Hx of chlamydia infection   . Hypothyroidism   . Ovarian cyst, right   . Uterine fibroid     Past Surgical History:  Procedure Laterality Date  . BIOPSY  11/14/2016   Procedure: BIOPSY;  Surgeon: Danie Binder, MD;  Location: [facility];  Service: Endoscopy;;  duodenal gastric  . COLPOSCOPY    . ESOPHAGOGASTRODUODENOSCOPY (EGD) WITH PROPOFOL N/A 11/14/2016   Procedure: ESOPHAGOGASTRODUODENOSCOPY (EGD) WITH PROPOFOL;  Surgeon: Danie Binder, MD;  Location: [facility];  Service: Endoscopy;  Laterality: N/A;  230   . lipoma surgery    . MASS EXCISION N/A 02/28/2017   Procedure: EXCISION LIPOMA SCALP 3CM;  Surgeon: Aviva Signs, MD;  Location: [facility];  Service: General;  Laterality: N/A;    Social History   Socioeconomic History  . Marital status: Married    Spouse name: Not on file  . Number of children: Not on file  . Years of education: Not on file  . Highest education level: Not on file  Occupational History  . Occupation: Theatre stage manager Needs  . Financial resource strain: Not on file  . Food insecurity:    Worry: Not on file    Inability: Not on file  . Transportation needs:    Medical: Not on file    Non-medical:  Not on file  Tobacco Use  . Smoking status: Never Smoker  . Smokeless tobacco: Never Used  Substance and Sexual Activity  . Alcohol use: Yes    Comment: occ  . Drug use: No  . Sexual activity: Yes    Partners: Male    Birth control/protection: None  Lifestyle  . Physical activity:    Days per week: Not on file    Minutes per session: Not on file  . Stress: Not on file  Relationships  . Social connections:    Talks on phone: Not on file    Gets together: Not on file    Attends religious service: Not on file    Active member of club or organization: Not on file    Attends meetings of clubs or organizations: Not on file    Relationship status: Not on file  . Intimate partner violence:    Fear of current or ex partner: Not on file    Emotionally abused: Not on file    Physically abused: Not on file    Forced sexual activity: Not on file  Other Topics Concern  . Not on file  Social History Narrative  . Not on file     No family history of premature CAD in 1st degree relatives.  Current Meds  Medication Sig  . Cholecalciferol (VITAMIN D3) 5000 units TABS Take 2 tablets by mouth daily.   . clonazePAM (KLONOPIN) 0.5 MG tablet TK 1 T PO QD PRN SEVERE ANXIETY  . hydrOXYzine (ATARAX/VISTARIL) 25 MG tablet Take 1 tablet (25 mg total) by mouth every 6 (six) hours as needed for anxiety.  Marland Kitchen ketoconazole (NIZORAL) 2 % shampoo Apply 1 application topically 2 (two) times a week.  . megestrol (MEGACE) 40 MG tablet 3 tablets a day for 5 days, 2 tablets a day for 5 days then 1 tablet daily  . pantoprazole (PROTONIX) 40 MG tablet Take 1 tablet (40 mg total) by mouth daily before breakfast. (Patient taking differently: Take 40 mg by mouth 2 (two) times daily. )  . thyroid (ARMOUR) 120 MG tablet Take 120 mg by mouth daily before breakfast.      Review of systems complete and found to be negative unless listed above in HPI    Physical exam Blood pressure 118/82, pulse 97, height 5\' 5"   (1.651 m), weight 214 lb (97.1 kg), SpO2 98 %. General: NAD Neck: No JVD, no thyromegaly or thyroid nodule.  Lungs: Clear to auscultation bilaterally with normal respiratory effort. CV: Nondisplaced PMI. Regular rate and rhythm, normal S1/S2, no S3/S4, no murmur.  No peripheral edema.  No carotid bruit.  Abdomen: Soft,  nontender, no distention.  Skin: Intact without lesions or rashes.  Neurologic: Alert and oriented x 3.  Psych: Normal affect. Extremities: No clubbing or cyanosis.  HEENT: Normal.   ECG: Most recent ECG reviewed.   Labs: Lab Results  Component Value Date/Time   K 4.0 02/01/2018 10:25 AM   BUN 10 02/01/2018 10:25 AM   CREATININE 0.69 02/01/2018 10:25 AM   ALT 16 05/28/2017 07:31 PM   HGB 10.1 (L) 02/01/2018 10:25 AM   HGB 9.6 (L) 08/09/2015 09:02 AM     Lipids: No results found for: LDLCALC, LDLDIRECT, CHOL, TRIG, HDL      ASSESSMENT AND PLAN:   1.  Chest pain with anxiety and depression: Very atypical for cardiac etiology.  She lacks significant risk factors.  I suspect she has developed anxiety with panic attacks.  She is to begin seeing a behavioral therapist on 02/21/2018.  I told her that if symptoms do not resolve or improve after 6 months of therapy, to contact me.  At the present time, I feel cardiovascular testing would be of low yield.  She has a low pretest probability of ischemic heart disease.    Disposition: Follow up as needed  Signed: Kate Sable, M.D., F.A.C.C.  02/07/2018, 4:04 PM

## 2018-02-13 DIAGNOSIS — E039 Hypothyroidism, unspecified: Secondary | ICD-10-CM | POA: Diagnosis not present

## 2018-02-13 DIAGNOSIS — E669 Obesity, unspecified: Secondary | ICD-10-CM | POA: Diagnosis not present

## 2018-02-13 DIAGNOSIS — E282 Polycystic ovarian syndrome: Secondary | ICD-10-CM | POA: Diagnosis not present

## 2018-02-16 ENCOUNTER — Telehealth: Payer: 59 | Admitting: Physician Assistant

## 2018-02-16 DIAGNOSIS — M546 Pain in thoracic spine: Secondary | ICD-10-CM

## 2018-02-16 MED ORDER — CYCLOBENZAPRINE HCL 10 MG PO TABS: 10.0000 mg | ORAL_TABLET | Freq: Three times a day (TID) | ORAL | 0 refills | Status: DC | PRN

## 2018-02-16 MED ORDER — NAPROXEN 500 MG PO TABS: 500.0000 mg | ORAL_TABLET | Freq: Two times a day (BID) | ORAL | 0 refills | Status: DC

## 2018-02-16 NOTE — Progress Notes (Signed)

## 2018-02-18 ENCOUNTER — Encounter: Payer: Self-pay | Admitting: Obstetrics & Gynecology

## 2018-02-28 DIAGNOSIS — F339 Major depressive disorder, recurrent, unspecified: Secondary | ICD-10-CM | POA: Diagnosis not present

## 2018-02-28 DIAGNOSIS — F410 Panic disorder [episodic paroxysmal anxiety] without agoraphobia: Secondary | ICD-10-CM | POA: Diagnosis not present

## 2018-03-06 ENCOUNTER — Encounter: Payer: Self-pay | Admitting: Obstetrics and Gynecology

## 2018-03-08 ENCOUNTER — Encounter: Payer: Self-pay | Admitting: Obstetrics and Gynecology

## 2018-03-08 ENCOUNTER — Ambulatory Visit (INDEPENDENT_AMBULATORY_CARE_PROVIDER_SITE_OTHER): Payer: 59 | Admitting: Obstetrics and Gynecology

## 2018-03-08 VITALS — BP 123/78 | HR 81 | Ht 64.5 in | Wt 210.8 lb

## 2018-03-08 DIAGNOSIS — Z3202 Encounter for pregnancy test, result negative: Secondary | ICD-10-CM

## 2018-03-08 DIAGNOSIS — Z3009 Encounter for other general counseling and advice on contraception: Secondary | ICD-10-CM | POA: Diagnosis not present

## 2018-03-08 DIAGNOSIS — N898 Other specified noninflammatory disorders of vagina: Secondary | ICD-10-CM

## 2018-03-08 DIAGNOSIS — R6882 Decreased libido: Secondary | ICD-10-CM

## 2018-03-08 LAB — POCT URINE PREGNANCY: Preg Test, Ur: NEGATIVE

## 2018-03-08 MED ORDER — ETONOGESTREL-ETHINYL ESTRADIOL 0.12-0.015 MG/24HR VA RING: VAGINAL_RING | VAGINAL | 4 refills | Status: DC

## 2018-03-08 NOTE — Progress Notes (Signed)
Patient ID: FATMATA LEGERE, female   DOB: [birth year], 27 y.o.   MRN: [id number]    [facility] Visit  @DATE @            Patient name: Emily Morales MRN [id number]  Date of birth: [birth year]  CC & HPI:  Emily Morales is a 27 y.o. female presenting today to discuss concerns about a decreased sex drive. She has a 2 y.o. child at home and reports that her relationship has been stressful lately because of infidelity. They are living together but at opposite ends of the house. They go to [employer] house [employer] for [employer]) and marriage counseling.   Pt is also concerned with vaginal discharge. Originally, she made the appointment for a missed period and she is not sure if the discharge is her menstrual period or not. Before her appointment, the bleeding was not heavy but at her visit, it was very heavy. Usually, her periods are heavy and she passes a lot of clots.  As for birth control, she had an IUD but got it removed because she did not like the effects on her body and she wants to have another baby. She forgets to take BCPs so she is interested in other birth control options.  The patient denies fever, chills or any other symptoms or complaints at this time.   ROS:  ROS -fever -chills All systems are negative except as noted in the HPI and PMH.    Pertinent History Reviewed:   Reviewed: Medical         Past Medical History:  Diagnosis Date  . Abnormal Pap smear   . Anemia   . Anxiety   . Depression   . Dysrhythmia    irregular heartrate  . GERD (gastroesophageal reflux disease)   . Headache(784.0)   . Hx of chlamydia infection   . Hypothyroidism   . Ovarian cyst, right   . PCOS (polycystic ovarian syndrome)   . Perimenopausal   . Uterine fibroid                               Surgical Hx:    Past Surgical History:  Procedure Laterality Date  . BIOPSY  11/14/2016   Procedure: BIOPSY;  Surgeon: Danie Binder, MD;  [facility];   Service: Endoscopy;;  duodenal gastric  . COLPOSCOPY    . ESOPHAGOGASTRODUODENOSCOPY (EGD) WITH PROPOFOL N/A 11/14/2016   Procedure: ESOPHAGOGASTRODUODENOSCOPY (EGD) WITH PROPOFOL;  Surgeon: Danie Binder, MD;  [facility];  Service: Endoscopy;  Laterality: N/A;  230   . lipoma surgery    . MASS EXCISION N/A 02/28/2017   Procedure: EXCISION LIPOMA SCALP 3CM;  Surgeon: Aviva Signs, MD;  Location: [facility];  Service: General;  Laterality: N/A;   Medications: Reviewed & Updated - see associated section                       Current Outpatient Medications:  .  clonazePAM (KLONOPIN) 0.5 MG tablet, TK 1 T PO QD PRN SEVERE ANXIETY, Disp: , Rfl: 0 .  cyclobenzaprine (FLEXERIL) 10 MG tablet, Take 10 mg by mouth as needed for muscle spasms., Disp: , Rfl:  .  naproxen (NAPROSYN) 500 MG tablet, Take 500 mg by mouth as needed., Disp: , Rfl:  .  progesterone (PROMETRIUM) 200 MG capsule, TK 1 C PO Q NIGHT, Disp: , Rfl: 2 .  thyroid (NP THYROID) 120 MG tablet, Take 120 mg by mouth daily before breakfast., Disp: , Rfl:   Social History: Reviewed -  reports that she has been smoking cigarettes. She has never used smokeless tobacco.  Objective Findings:  Vitals: Blood pressure 123/78, pulse 81, height 5' 4.5" (1.638 m), weight 210 lb 12.8 oz (95.6 kg).  PHYSICAL EXAMINATION General appearance - alert, well appearing, and in no distress and oriented to person, place, and time Mental status - alert, oriented to person, place, and time, normal mood, behavior, speech, dress, motor activity, and thought processes, affect appropriate to mood  PELVIC Vagina - heavy menses Cervix - multiparous, no discharge noted  Discussion: 1. Discussed with pt risks and benefits of various contraceptive methods At end of discussion, pt had opportunity to ask questions and has no further questions at this time.   Specific discussion of contraception as noted above. Greater than 50% was spent in counseling and  coordination of care with the patient.   Total time greater than: 10 minutes.   Assessment & Plan:   A:  1.  Normal menstrual period  P:  1. Use My Fertility Friend app/website when pregnancy desired 2. Rx Nuvaring 3. F/U prn By signing my name below, I, De Burrs, attest that this documentation has been prepared under the direction and in the presence of Jonnie Kind, MD. Electronically Signed: De Burrs, Medical Scribe. 03/08/18. 11:14 AM.  I personally performed the services described in this documentation, which was SCRIBED in my presence. The recorded information has been reviewed and considered accurate. It has been edited as necessary during review. Jonnie Kind, MD

## 2018-03-08 NOTE — Patient Instructions (Addendum)
Consider My Fertility Friend app or website.Ethinyl Estradiol; Etonogestrel vaginal ring What is this medicine? ETHINYL ESTRADIOL; ETONOGESTREL (ETH in il es tra DYE ole; et oh noe JES trel) vaginal ring is a flexible, vaginal ring used as a contraceptive (birth control method). This medicine combines two types of female hormones, an estrogen and a progestin. This ring is used to prevent ovulation and pregnancy. Each ring is effective for one month. This medicine may be used for other purposes; ask your health care provider or pharmacist if you have questions. COMMON BRAND NAME(S): NuvaRing What should I tell my health care provider before I take this medicine? They need to know if you have or ever had any of these conditions: -abnormal vaginal bleeding -blood vessel disease or blood clots -breast, cervical, endometrial, ovarian, liver, or uterine cancer -diabetes -gallbladder disease -heart disease or recent heart attack -high blood pressure -high cholesterol -kidney disease -liver disease -migraine headaches -stroke -systemic lupus erythematosus (SLE) -tobacco smoker -an unusual or allergic reaction to estrogens, progestins, other medicines, foods, dyes, or preservatives -pregnant or trying to get pregnant -breast-feeding How should I use this medicine? Insert the ring into your vagina as directed. Follow the directions on the prescription label. The ring will remain place for 3 weeks and is then removed for a 1-week break. A new ring is inserted 1 week after the last ring was removed, on the same day of the week. Check often to make sure the ring is still in place, especially before and after sexual intercourse. If the ring was out of the vagina for an unknown amount of time, you may not be protected from pregnancy. Perform a pregnancy test and call your doctor. Do not use more often than directed. A patient package insert for the product will be given with each prescription and refill.  Read this sheet carefully each time. The sheet may change frequently. Contact your pediatrician regarding the use of this medicine in children. Special care may be needed. This medicine has been used in female children who have started having menstrual periods. Overdosage: If you think you have taken too much of this medicine contact a poison control center or emergency room at once. NOTE: This medicine is only for you. Do not share this medicine with others. What if I miss a dose? You will need to replace your vaginal ring once a month as directed. If the ring should slip out, or if you leave it in longer or shorter than you should, contact your health care professional for advice. What may interact with this medicine? Do not take this medicine with the following medication: -dasabuvir; ombitasvir; paritaprevir; ritonavir -ombitasvir; paritaprevir; ritonavir This medicine may also interact with the following medications: -acetaminophen -antibiotics or medicines for infections, especially rifampin, rifabutin, rifapentine, and griseofulvin, and possibly penicillins or tetracyclines -aprepitant -ascorbic acid (vitamin C) -atorvastatin -barbiturate medicines, such as phenobarbital -bosentan -carbamazepine -caffeine -clofibrate -cyclosporine -dantrolene -doxercalciferol -felbamate -grapefruit juice -hydrocortisone -medicines for anxiety or sleeping problems, such as diazepam or temazepam -medicines for diabetes, including pioglitazone -modafinil -mycophenolate -nefazodone -oxcarbazepine -phenytoin -prednisolone -ritonavir or other medicines for HIV infection or AIDS -rosuvastatin -selegiline -soy isoflavones supplements -St. John's wort -tamoxifen or raloxifene -theophylline -thyroid hormones -topiramate -warfarin This list may not describe all possible interactions. Give your health care provider a list of all the medicines, herbs, non-prescription drugs, or dietary  supplements you use. Also tell them if you smoke, drink alcohol, or use illegal drugs. Some items may interact with your medicine. What  should I watch for while using this medicine? Visit your doctor or health care professional for regular checks on your progress. You will need a regular breast and pelvic exam and Pap smear while on this medicine. Use an additional method of contraception during the first cycle that you use this ring. Do not use a diaphragm or female condom, as the ring can interfere with these birth control methods and their proper placement. If you have any reason to think you are pregnant, stop using this medicine right away and contact your doctor or health care professional. If you are using this medicine for hormone related problems, it may take several cycles of use to see improvement in your condition. Smoking increases the risk of getting a blood clot or having a stroke while you are using hormonal birth control, especially if you are more than 29 years old. You are strongly advised not to smoke. This medicine can make your body retain fluid, making your fingers, hands, or ankles swell. Your blood pressure can go up. Contact your doctor or health care professional if you feel you are retaining fluid. This medicine can make you more sensitive to the sun. Keep out of the sun. If you cannot avoid being in the sun, wear protective clothing and use sunscreen. Do not use sun lamps or tanning beds/booths. If you wear contact lenses and notice visual changes, or if the lenses begin to feel uncomfortable, consult your eye care specialist. In some women, tenderness, swelling, or minor bleeding of the gums may occur. Notify your dentist if this happens. Brushing and flossing your teeth regularly may help limit this. See your dentist regularly and inform your dentist of the medicines you are taking. If you are going to have elective surgery, you may need to stop using this medicine before  the surgery. Consult your health care professional for advice. This medicine does not protect you against HIV infection (AIDS) or any other sexually transmitted diseases. What side effects may I notice from receiving this medicine? Side effects that you should report to your doctor or health care professional as soon as possible: -breast tissue changes or discharge -changes in vaginal bleeding during your period or between your periods -chest pain -coughing up blood -dizziness or fainting spells -headaches or migraines -leg, arm or groin pain -severe or sudden headaches -stomach pain (severe) -sudden shortness of breath -sudden loss of coordination, especially on one side of the body -speech problems -symptoms of vaginal infection like itching, irritation or unusual discharge -tenderness in the upper abdomen -vomiting -weakness or numbness in the arms or legs, especially on one side of the body -yellowing of the eyes or skin Side effects that usually do not require medical attention (report to your doctor or health care professional if they continue or are bothersome): -breakthrough bleeding and spotting that continues beyond the 3 initial cycles of pills -breast tenderness -mood changes, anxiety, depression, frustration, anger, or emotional outbursts -increased sensitivity to sun or ultraviolet light -nausea -skin rash, acne, or brown spots on the skin -weight gain (slight) This list may not describe all possible side effects. Call your doctor for medical advice about side effects. You may report side effects to FDA at 1-800-FDA-1088. Where should I keep my medicine? Keep out of the reach of children. Store at room temperature between 15 and 30 degrees C (59 and 86 degrees F) for up to 4 months. The product will expire after 4 months. Protect from light. Throw away any unused medicine  after the expiration date. NOTE: This sheet is a summary. It may not cover all possible  information. If you have questions about this medicine, talk to your doctor, pharmacist, or health care provider.  2018 Elsevier/Gold Standard (2016-03-24 17:00:31)

## 2018-03-17 ENCOUNTER — Telehealth: Payer: 59 | Admitting: Family

## 2018-03-17 DIAGNOSIS — R21 Rash and other nonspecific skin eruption: Secondary | ICD-10-CM | POA: Diagnosis not present

## 2018-03-17 NOTE — Progress Notes (Signed)
E Visit for Rash  We are sorry that you are not feeling well. Here is how we plan to help!  Based on what you shared with me you may have a virus or an allergic reaction.  I recommend you take Benadryl 25 mg - 50 mg every 4 hours to control the symptoms but if they last over 24 hours it is best that you see an office based provider for follow up.   HOME CARE:   Take cool showers and avoid direct sunlight.  Apply cool compress or wet dressings.  Take a bath in an oatmeal bath.  Sprinkle content of one Aveeno packet under running faucet with comfortably warm water.  Bathe for 15-20 minutes, 1-2 times daily.  Pat dry with a towel. Do not rub the rash.  Use hydrocortisone cream.  Take an antihistamine like Benadryl for widespread rashes that itch.  The adult dose of Benadryl is 25-50 mg by mouth 4 times daily.  Caution:  This type of medication may cause sleepiness.  Do not drink alcohol, drive, or operate dangerous machinery while taking antihistamines.  Do not take these medications if you have prostate enlargement.  Read package instructions thoroughly on all medications that you take.  GET HELP RIGHT AWAY IF:   Symptoms don't go away after treatment.  Severe itching that persists.  If you rash spreads or swells.  If you rash begins to smell.  If it blisters and opens or develops a yellow-brown crust.  You develop a fever.  You have a sore throat.  You become short of breath.  MAKE SURE YOU:  Understand these instructions. Will watch your condition. Will get help right away if you are not doing well or get worse.  Thank you for choosing an e-visit. Your e-visit answers were reviewed by a board certified advanced clinical practitioner to complete your personal care plan. Depending upon the condition, your plan could have included both over the counter or prescription medications. Please review your pharmacy choice. Be sure that the pharmacy you have chosen is open so  that you can pick up your prescription now.  If there is a problem you may message your provider in [place] to have the prescription routed to another pharmacy. Your safety is important to [place]. If you have drug allergies check your prescription carefully.  For the next 24 hours, you can use MyChart to ask questions about today's visit, request a non-urgent call back, or ask for a work or school excuse from your e-visit provider. You will get an email in the next two days asking about your experience. I hope that your e-visit has been valuable and will speed your recovery.

## 2018-03-18 ENCOUNTER — Other Ambulatory Visit: Payer: Self-pay | Admitting: Obstetrics and Gynecology

## 2018-03-18 MED ORDER — SERTRALINE HCL 50 MG PO TABS: 50.0000 mg | ORAL_TABLET | Freq: Every day | ORAL | 6 refills | Status: DC

## 2018-03-18 NOTE — Progress Notes (Signed)
Placed on zoloft 50

## 2018-04-08 ENCOUNTER — Ambulatory Visit: Payer: 59 | Admitting: Family Medicine

## 2018-04-08 ENCOUNTER — Encounter: Payer: Self-pay | Admitting: Family Medicine

## 2018-04-08 DIAGNOSIS — Z0289 Encounter for other administrative examinations: Secondary | ICD-10-CM

## 2018-04-15 ENCOUNTER — Telehealth: Payer: 59 | Admitting: Family

## 2018-04-15 DIAGNOSIS — B9689 Other specified bacterial agents as the cause of diseases classified elsewhere: Secondary | ICD-10-CM

## 2018-04-15 DIAGNOSIS — J019 Acute sinusitis, unspecified: Secondary | ICD-10-CM

## 2018-04-15 MED ORDER — AMOXICILLIN-POT CLAVULANATE 875-125 MG PO TABS: 1.0000 | ORAL_TABLET | Freq: Two times a day (BID) | ORAL | 0 refills | Status: DC

## 2018-04-15 NOTE — Progress Notes (Signed)

## 2018-04-24 ENCOUNTER — Telehealth: Payer: 59 | Admitting: Family Medicine

## 2018-04-24 DIAGNOSIS — R05 Cough: Secondary | ICD-10-CM | POA: Diagnosis not present

## 2018-04-24 DIAGNOSIS — R059 Cough, unspecified: Secondary | ICD-10-CM

## 2018-04-24 MED ORDER — BENZONATATE 100 MG PO CAPS: 100.0000 mg | ORAL_CAPSULE | Freq: Three times a day (TID) | ORAL | 0 refills | Status: DC | PRN

## 2018-04-24 NOTE — Progress Notes (Signed)

## 2018-05-02 ENCOUNTER — Encounter: Payer: Self-pay | Admitting: Obstetrics & Gynecology

## 2018-05-02 ENCOUNTER — Ambulatory Visit (INDEPENDENT_AMBULATORY_CARE_PROVIDER_SITE_OTHER): Payer: 59 | Admitting: Obstetrics & Gynecology

## 2018-05-02 VITALS — BP 114/81 | HR 99 | Ht 66.0 in | Wt 215.5 lb

## 2018-05-02 DIAGNOSIS — G4709 Other insomnia: Secondary | ICD-10-CM | POA: Diagnosis not present

## 2018-05-02 DIAGNOSIS — E038 Other specified hypothyroidism: Secondary | ICD-10-CM

## 2018-05-02 DIAGNOSIS — N930 Postcoital and contact bleeding: Secondary | ICD-10-CM | POA: Diagnosis not present

## 2018-05-02 MED ORDER — ZOLPIDEM TARTRATE 10 MG PO TABS: 10.0000 mg | ORAL_TABLET | Freq: Every evening | ORAL | 3 refills | Status: DC | PRN

## 2018-05-02 NOTE — Progress Notes (Signed)
Patient ID: Emily Morales, female   DOB: [birth year], 27 y.o.   MRN: [id number]      Chief Complaint  Patient presents with  . having pelvic pain    bleeding after sex; trouble sleeping      27 y.o. G2P2002 Patient's last menstrual period was 04/15/2018. The current method of family planning is nuvaring.  Outpatient Encounter Medications as of 05/02/2018  Medication Sig  . etonogestrel-ethinyl estradiol (NUVARING) 0.12-0.015 MG/24HR vaginal ring Insert vaginally and leave in place for 3 1/2 consecutive weeks, then remove for 4 days. Repeat.  . sertraline (ZOLOFT) 50 MG tablet Take 1 tablet (50 mg total) by mouth daily.  Marland Kitchen zolpidem (AMBIEN) 10 MG tablet Take 1 tablet (10 mg total) by mouth at bedtime as needed for sleep.  . [DISCONTINUED] amoxicillin-clavulanate (AUGMENTIN) 875-125 MG tablet Take 1 tablet by mouth 2 (two) times daily.  . [DISCONTINUED] benzonatate (TESSALON) 100 MG capsule Take 1 capsule (100 mg total) by mouth 3 (three) times daily as needed for cough.  . [DISCONTINUED] clonazePAM (KLONOPIN) 0.5 MG tablet TK 1 T PO QD PRN SEVERE ANXIETY  . [DISCONTINUED] cyclobenzaprine (FLEXERIL) 10 MG tablet Take 10 mg by mouth as needed for muscle spasms.  . [DISCONTINUED] naproxen (NAPROSYN) 500 MG tablet Take 500 mg by mouth as needed.  . [DISCONTINUED] progesterone (PROMETRIUM) 200 MG capsule TK 1 C PO Q NIGHT  . [DISCONTINUED] thyroid (NP THYROID) 120 MG tablet Take 120 mg by mouth daily before breakfast.   No facility-administered encounter medications on file as of 05/02/2018.     Subjective Post coital bleeding x 2 episodes no pain Chronic secondary insomnia gets to sleep ok but gets 3-4 hours of sleep only Has not had a Pap in 3 years, scheduled 12/3 Has tried multiple options for insomnia Past Medical History:  Diagnosis Date  . Abnormal Pap smear   . Anemia   . Anxiety   . Depression   . Dysrhythmia    irregular heartrate  . GERD (gastroesophageal reflux  disease)   . Headache(784.0)   . Hx of chlamydia infection   . Hypothyroidism   . Ovarian cyst, right   . PCOS (polycystic ovarian syndrome)   . Perimenopausal   . Uterine fibroid     Past Surgical History:  Procedure Laterality Date  . BIOPSY  11/14/2016   Procedure: BIOPSY;  Surgeon: Danie Binder, MD;  [facility];  Service: Endoscopy;;  duodenal gastric  . COLPOSCOPY    . ESOPHAGOGASTRODUODENOSCOPY (EGD) WITH PROPOFOL N/A 11/14/2016   Procedure: ESOPHAGOGASTRODUODENOSCOPY (EGD) WITH PROPOFOL;  Surgeon: Danie Binder, MD;  [facility];  Service: Endoscopy;  Laterality: N/A;  230   . lipoma surgery    . MASS EXCISION N/A 02/28/2017   Procedure: EXCISION LIPOMA SCALP 3CM;  Surgeon: Aviva Signs, MD;  Location: [facility];  Service: General;  Laterality: N/A;    OB History    Gravida  2   Para  2   Term  2   Preterm  0   AB  0   Living  2     SAB  0   TAB  0   Ectopic  0   Multiple  0   Live Births  2           Allergies  Allergen Reactions  . Pollen Extract Cough  . Latex Rash  . Other Rash    Coconut causes a rash    Social History  Socioeconomic History  . Marital status: Married    Spouse name: Not on file  . Number of children: Not on file  . Years of education: Not on file  . Highest education level: Not on file  Occupational History  . Occupation: Theatre stage manager Needs  . Financial resource strain: Not on file  . Food insecurity:    Worry: Not on file    Inability: Not on file  . Transportation needs:    Medical: Not on file    Non-medical: Not on file  Tobacco Use  . Smoking status: Current Some Day Smoker    Types: Cigarettes  . Smokeless tobacco: Never Used  Substance and Sexual Activity  . Alcohol use: Yes    Comment: occ  . Drug use: Yes    Types: Marijuana    Comment: rarely  . Sexual activity: Yes    Partners: Male    Birth control/protection: Inserts  Lifestyle  . Physical activity:     Days per week: Not on file    Minutes per session: Not on file  . Stress: Not on file  Relationships  . Social connections:    Talks on phone: Not on file    Gets together: Not on file    Attends religious service: Not on file    Active member of club or organization: Not on file    Attends meetings of clubs or organizations: Not on file    Relationship status: Not on file  Other Topics Concern  . Not on file  Social History Narrative  . Not on file    Family History  Problem Relation Age of Onset  . Diabetes Father   . Hypertension Father   . Other Son        reactive airway disease  . Asthma Son   . Heart murmur Son   . Cancer Maternal Grandmother        lung  . Heart disease Paternal Grandmother   . Liver disease Paternal Grandfather        etoh related  . Colon cancer Neg Hx     Medications:       Current Outpatient Medications:  .  etonogestrel-ethinyl estradiol (NUVARING) 0.12-0.015 MG/24HR vaginal ring, Insert vaginally and leave in place for 3 1/2 consecutive weeks, then remove for 4 days. Repeat., Disp: 3 each, Rfl: 4 .  sertraline (ZOLOFT) 50 MG tablet, Take 1 tablet (50 mg total) by mouth daily., Disp: 30 tablet, Rfl: 6 .  zolpidem (AMBIEN) 10 MG tablet, Take 1 tablet (10 mg total) by mouth at bedtime as needed for sleep., Disp: 30 tablet, Rfl: 3  Objective Blood pressure 114/81, pulse 99, height 5\' 6"  (1.676 m), weight 215 lb 8 oz (97.8 kg), last menstrual period 04/15/2018.  General WDWN female NAD Vulva:  normal appearing vulva with no masses, tenderness or lesions Vagina:  normal mucosa, no discharge Cervix:  no cervical motion tenderness, no lesions and no abnormalities Uterus:  normal size, contour, position, consistency, mobility, non-tender Adnexa: ovaries:present,     Pertinent ROS No burning with urination, frequency or urgency No nausea, vomiting or diarrhea Nor fever chills or other constitutional symptoms   Labs or  studies none    Impression Diagnoses this Encounter::   ICD-10-CM   1. PCB (post coital bleeding) N93.0   2. Other specified hypothyroidism E03.8 TSH  3. Other insomnia G47.09     Established relevant diagnosis(es): depression  Plan/Recommendations: Meds ordered this encounter  Medications  .  zolpidem (AMBIEN) 10 MG tablet    Sig: Take 1 tablet (10 mg total) by mouth at bedtime as needed for sleep.    Dispense:  30 tablet    Refill:  3    Labs or Scans Ordered: Orders Placed This Encounter  Procedures  . TSH    Management:: >ambien for sleep >check TSH >no cervical lesion noted >Needs Pap: scheduled for December  Follow up Return for keep scheduled.       All questions were answered.

## 2018-05-03 LAB — TSH: TSH: 2.69 u[IU]/mL (ref 0.450–4.500)

## 2018-05-09 ENCOUNTER — Telehealth: Payer: 59 | Admitting: Family

## 2018-05-09 DIAGNOSIS — T63451A Toxic effect of venom of hornets, accidental (unintentional), initial encounter: Secondary | ICD-10-CM | POA: Diagnosis not present

## 2018-05-09 MED ORDER — PREDNISONE 10 MG PO TABS: 10.0000 mg | ORAL_TABLET | ORAL | 0 refills | Status: DC

## 2018-05-09 NOTE — Progress Notes (Signed)
Thank you for the details you included in the comment boxes. Those details are very helpful in determining the best course of treatment for you and help [place] to provide the best care.  E Visit for Insect Sting  Thank you for describing the insect sting for [place].  Here is how we plan to help!  A sting that we will treat with a short course of prednisone.  The 2 greatest risks from insect stings are allergic reaction, which can be fatal in some people and infection, which is more common and less serious.  Bees, wasps, yellow jackets, and hornets belong to a class of insects called Hymenoptera.  Most insect stings cause only minor discomfort.  Stings can happen anywhere on the body and can be painful.  Most stings are from honey bees or yellow jackets.  Fire ants can sting multiple times.  The sites of the stings are more likely to become infected.    Based on your information I have:, Provided a home care guide for insect stings and instructions on when to call for help. and I have sent in prednisone 10 mg tapering dose for 5 days to the pharmacy you selected.  Please make sure that you selected a pharmacy that is open now.  What can be used to prevent Insect Stings?   Insect repellant with at least 20% DEET.    Wearing long pants and shirts with socks and shoes.    Wear dark or drab-colored clothes rather than bright colors.    Avoid using perfumes and hair sprays; these attract insects.  HOME CARE ADVICE:  1. Stinger removal:  The stinger looks like a tiny black dot in the sting.  Use a fingernail, credit card edge, or knife-edge to scrape it off.  Don't pull it out because it squeezes out more venom.  If the stinger is below the skin surface, leave it alone.  It will be shed with normal skin healing. 2. Use cold compresses to the area of the sting for 10-20 minutes.  You may repeat this as needed to relieve symptoms of pain and swelling. 3.  For pain relief, take acetominophen  650 mg 4-6 hours as needed or ibuprofen 400 mg every 6-8 hours as needed or naproxen 250-500 mg every 12 hours as needed. 4.  You can also use hydrocortisone cream 0.5% or 1% up to 4 times daily as needed for itching. 5.  If the sting becomes very itchy, take Benadryl 25-50 mg, follow directions on box. 6.  Wash the area 2-3 times daily with antibacterial soap and warm water. 7. Call your Doctor if:  Fever, a severe headache, or rash occur in the next 2 weeks.  Sting area begins to look infected.  Redness and swelling worsens after home treatment.  Your current symptoms become worse.    MAKE SURE YOU:   Understand these instructions.  Will watch your condition.  Will get help right away if you are not doing well or get worse.  Thank you for choosing an e-visit. Your e-visit answers were reviewed by a board certified advanced clinical practitioner to complete your personal care plan. Depending upon the condition, your plan could have included both over the counter or prescription medications. Please review your pharmacy choice. Be sure that the pharmacy you have chosen is open so that you can pick up your prescription now.  If there is a problem you may message your provider in [place] to have the prescription routed to another  pharmacy. Your safety is important to [place]. If you have drug allergies check your prescription carefully.  For the next 24 hours, you can use MyChart to ask questions about today's visit, request a non-urgent call back, or ask for a work or school excuse from your e-visit provider. You will get an email in the next two days asking about your experience. I hope that your e-visit has been valuable and will speed your recovery.

## 2018-05-22 ENCOUNTER — Telehealth: Payer: 59 | Admitting: Nurse Practitioner

## 2018-05-22 DIAGNOSIS — N3000 Acute cystitis without hematuria: Secondary | ICD-10-CM | POA: Diagnosis not present

## 2018-05-22 MED ORDER — NITROFURANTOIN MONOHYD MACRO 100 MG PO CAPS: 100.0000 mg | ORAL_CAPSULE | Freq: Two times a day (BID) | ORAL | 0 refills | Status: DC

## 2018-05-22 MED ORDER — FLUCONAZOLE 150 MG PO TABS: 150.0000 mg | ORAL_TABLET | Freq: Once | ORAL | 0 refills | Status: AC

## 2018-05-22 NOTE — Progress Notes (Signed)
We are sorry that you are not feeling well.  Here is how we plan to help!  Based on what you shared with me it looks like you most likely have a simple urinary tract infection.  A UTI (Urinary Tract Infection) is a bacterial infection of the bladder.  Most cases of urinary tract infections are simple to treat but a key part of your care is to encourage you to drink plenty of fluids and watch your symptoms carefully.  I have prescribed MacroBid 100 mg twice a day for 5 days.  Your symptoms should gradually improve. Call us if the burning in your urine worsens, you develop worsening fever, back pain or pelvic pain or if your symptoms do not resolve after completing the antibiotic. I also sent in diflucan as requested.  Urinary tract infections can be prevented by drinking plenty of water to keep your body hydrated.  Also be sure when you wipe, wipe from front to back and don't hold it in!  If possible, empty your bladder every 4 hours.  Your e-visit answers were reviewed by a board certified advanced clinical practitioner to complete your personal care plan.  Depending on the condition, your plan could have included both over the counter or prescription medications.  If there is a problem please reply  once you have received a response from your provider.  Your safety is important to [place].  If you have drug allergies check your prescription carefully.    You can use MyChart to ask questions about today's visit, request a non-urgent call back, or ask for a work or school excuse for 24 hours related to this e-Visit. If it has been greater than 24 hours you will need to follow up with your provider, or enter a new e-Visit to address those concerns.   You will get an e-mail in the next two days asking about your experience.  I hope that your e-visit has been valuable and will speed your recovery. Thank you for using e-visits.

## 2018-05-30 ENCOUNTER — Other Ambulatory Visit: Payer: Self-pay

## 2018-05-31 ENCOUNTER — Telehealth: Payer: 59 | Admitting: Family

## 2018-05-31 DIAGNOSIS — T63451A Toxic effect of venom of hornets, accidental (unintentional), initial encounter: Secondary | ICD-10-CM | POA: Diagnosis not present

## 2018-05-31 DIAGNOSIS — R21 Rash and other nonspecific skin eruption: Secondary | ICD-10-CM | POA: Diagnosis not present

## 2018-05-31 MED ORDER — PREDNISONE 10 MG PO TABS
10.0000 mg | ORAL_TABLET | ORAL | 0 refills | Status: DC
Start: 2018-05-31 — End: 2018-07-02

## 2018-05-31 NOTE — Progress Notes (Signed)
Thank you for the details you included in the comment boxes. Those details are very helpful in determining the best course of treatment for you and help [place] to provide the best care. I saw you listed the detergent (GAIN). It could certainly be a factor for contact dermatitis. Please switch to something neutral like ALL FREE CLEAR or a store brand of "CLEAR" detergent. See below.  E Visit for Rash  We are sorry that you are not feeling well. Here is how we plan to help!  Based on what you shared with me it looks like you have contact dermatitis.  Contact dermatitis is a skin rash caused by something that touches the skin and causes irritation or inflammation.  Your skin may be red, swollen, dry, cracked, and itch.  The rash should go away in a few days but can last a few weeks.  If you get a rash, it's important to figure out what caused it so the irritant can be avoided in the future.   Prednisone 5 mg daily for 6 days (see taper instructions below)  Directions for 6 day taper: Day 1: 2 tablets before breakfast, 1 after both lunch & dinner and 2 at bedtime Day 2: 1 tab before breakfast, 1 after both lunch & dinner and 2 at bedtime Day 3: 1 tab at each meal & 1 at bedtime Day 4: 1 tab at breakfast, 1 at lunch, 1 at bedtime Day 5: 1 tab at breakfast & 1 tab at bedtime Day 6: 1 tab at breakfast   HOME CARE:   Take cool showers and avoid direct sunlight.  Apply cool compress or wet dressings.  Take a bath in an oatmeal bath.  Sprinkle content of one Aveeno packet under running faucet with comfortably warm water.  Bathe for 15-20 minutes, 1-2 times daily.  Pat dry with a towel. Do not rub the rash.  Use hydrocortisone cream.  Take an antihistamine like Benadryl for widespread rashes that itch.  The adult dose of Benadryl is 25-50 mg by mouth 4 times daily.  Caution:  This type of medication may cause sleepiness.  Do not drink alcohol, drive, or operate dangerous machinery while taking  antihistamines.  Do not take these medications if you have prostate enlargement.  Read package instructions thoroughly on all medications that you take.  GET HELP RIGHT AWAY IF:   Symptoms don't go away after treatment.  Severe itching that persists.  If you rash spreads or swells.  If you rash begins to smell.  If it blisters and opens or develops a yellow-brown crust.  You develop a fever.  You have a sore throat.  You become short of breath.  MAKE SURE YOU:  Understand these instructions. Will watch your condition. Will get help right away if you are not doing well or get worse.  Thank you for choosing an e-visit. Your e-visit answers were reviewed by a board certified advanced clinical practitioner to complete your personal care plan. Depending upon the condition, your plan could have included both over the counter or prescription medications. Please review your pharmacy choice. Be sure that the pharmacy you have chosen is open so that you can pick up your prescription now.  If there is a problem you may message your provider in [place] to have the prescription routed to another pharmacy. Your safety is important to [place]. If you have drug allergies check your prescription carefully.  For the next 24 hours, you can use MyChart to ask  questions about today's visit, request a non-urgent call back, or ask for a work or school excuse from your e-visit provider. You will get an email in the next two days asking about your experience. I hope that your e-visit has been valuable and will speed your recovery.

## 2018-06-04 ENCOUNTER — Other Ambulatory Visit: Payer: Self-pay

## 2018-06-05 MED ORDER — ZOLPIDEM TARTRATE 10 MG PO TABS: 10.0000 mg | ORAL_TABLET | Freq: Every evening | ORAL | 3 refills | Status: DC | PRN

## 2018-06-12 ENCOUNTER — Telehealth: Payer: 59 | Admitting: Nurse Practitioner

## 2018-06-12 DIAGNOSIS — L700 Acne vulgaris: Secondary | ICD-10-CM

## 2018-06-12 MED ORDER — CLINDAMYCIN PHOSPHATE 1 % EX LOTN: TOPICAL_LOTION | Freq: Two times a day (BID) | CUTANEOUS | 0 refills | Status: DC

## 2018-06-12 MED ORDER — MINOCYCLINE HCL 50 MG PO TABS: 50.0000 mg | ORAL_TABLET | Freq: Two times a day (BID) | ORAL | 0 refills | Status: DC

## 2018-06-12 MED ORDER — MINOCYCLINE HCL 50 MG PO TABS: 50.0000 mg | ORAL_TABLET | Freq: Every day | ORAL | 0 refills | Status: DC

## 2018-06-12 NOTE — Progress Notes (Signed)
We are sorry that you are experiencing this issue.  Here is how we plan to help!  Based on what you shared with me it looks like you have cystic acne.  Acne is a disorder of the hair follicles and oil glands (sebaceous glands). The sebaceous glands secrete oils to keep the skin moist.  When the glands get clogged, it can lead to pimples or cysts.  These cysts may become infected and leave scars. Acne is very common and normally occurs at puberty.  Acne is also inherited.  Your personal care plan consists of the following recommendations:  I recommend that you use a daily cleanser  You might try an over the counter cleanser that has benzoyl peroxide.  I recommend that you start with a product that has 2.5% benzoyl peroxide.  Stronger concentrations have not been shown to be more effective.  I have prescribed a topical gel with an antibiotic:  Clindamycin 1% lotion.  Apply the lotion to the affected skin twice daily.  Be sure to read the package insert to understand potential side effects,  I have also prescribed one of the following additional therapies:  Minocycline an oral antibiotic 50 mg twice a day  If excessive dryness or peeling occurs, reduce dose frequency or concentration of the topical scrubs.  If excessive stinging or burning occurs, remove the topical gel with mild soap and water and resume at a lower dose the next day.  Remember oral antibiotics and topical acne treatments may increase your sensitivity to the sun!  HOME CARE:  Do not squeeze pimples because that can often lead to infections, worse acne, and scars.  Use a moisturizer that contains retinoid or fruit acids that may inhibit the development of new acne lesions.  Although there is not a clear link that foods can cause acne, doctors do believe that too many sweets predispose you to skin problems.  GET HELP RIGHT AWAY IF:  If your acne gets worse or is not better within 10 days.  If you become depressed.  If  you become pregnant, discontinue medications and call your OB/GYN.  MAKE SURE YOU:  Understand these instructions.  Will watch your condition.  Will get help right away if you are not doing well or get worse.   Your e-visit answers were reviewed by a board certified advanced clinical practitioner to complete your personal care plan.  Depending upon the condition, your plan could have included both over the counter or prescription medications.  Please review your pharmacy choice.  If there is a problem, you may contact your provider through [employer] and have the prescription routed to another pharmacy.  Your safety is important to [place].  If you have drug allergies check your prescription carefully.  For the next 24 hours you can use MyChart to ask questions about today's visit, request a non-urgent call back, or ask for a work or school excuse from your e-visit provider.  You will get an email in the next two days asking about your experience. I hope that your e-visit has been valuable and will speed your recovery.

## 2018-06-18 ENCOUNTER — Inpatient Hospital Stay (HOSPITAL_COMMUNITY)
Admission: AD | Admit: 2018-06-18 | Discharge: 2018-06-18 | Disposition: A | Discharge status: Home / Self Care | Payer: 59 | Source: Ambulatory Visit | Attending: Obstetrics & Gynecology | Admitting: Obstetrics & Gynecology

## 2018-06-18 ENCOUNTER — Encounter (HOSPITAL_COMMUNITY): Payer: Self-pay | Admitting: *Deleted

## 2018-06-18 ENCOUNTER — Telehealth: Payer: 59 | Admitting: Nurse Practitioner

## 2018-06-18 DIAGNOSIS — N939 Abnormal uterine and vaginal bleeding, unspecified: Secondary | ICD-10-CM | POA: Diagnosis not present

## 2018-06-18 DIAGNOSIS — R112 Nausea with vomiting, unspecified: Secondary | ICD-10-CM | POA: Diagnosis not present

## 2018-06-18 DIAGNOSIS — Z789 Other specified health status: Secondary | ICD-10-CM

## 2018-06-18 DIAGNOSIS — M545 Low back pain, unspecified: Secondary | ICD-10-CM

## 2018-06-18 DIAGNOSIS — R42 Dizziness and giddiness: Secondary | ICD-10-CM | POA: Insufficient documentation

## 2018-06-18 DIAGNOSIS — Z87891 Personal history of nicotine dependence: Secondary | ICD-10-CM | POA: Diagnosis not present

## 2018-06-18 DIAGNOSIS — Z8742 Personal history of other diseases of the female genital tract: Secondary | ICD-10-CM

## 2018-06-18 DIAGNOSIS — R109 Unspecified abdominal pain: Secondary | ICD-10-CM | POA: Diagnosis not present

## 2018-06-18 LAB — CBC WITH DIFFERENTIAL/PLATELET
Basophils Absolute: 0 10*3/uL (ref 0.0–0.1)
Basophils Relative: 0 %
Eosinophils Absolute: 0.1 10*3/uL (ref 0.0–0.5)
Eosinophils Relative: 3 %
HCT: 34.1 % — ABNORMAL LOW (ref 36.0–46.0)
Hemoglobin: 11 g/dL — ABNORMAL LOW (ref 12.0–15.0)
Lymphocytes Relative: 38 %
Lymphs Abs: 1.6 10*3/uL (ref 0.7–4.0)
MCH: 26.3 pg (ref 26.0–34.0)
MCHC: 32.3 g/dL (ref 30.0–36.0)
MCV: 81.6 fL (ref 80.0–100.0)
Monocytes Absolute: 0.2 10*3/uL (ref 0.1–1.0)
Monocytes Relative: 5 %
Neutro Abs: 2.3 10*3/uL (ref 1.7–7.7)
Neutrophils Relative %: 54 %
Platelets: 235 10*3/uL (ref 150–400)
RBC: 4.18 MIL/uL (ref 3.87–5.11)
RDW: 14.6 % (ref 11.5–15.5)
WBC: 4.2 10*3/uL (ref 4.0–10.5)
nRBC: 0 % (ref 0.0–0.2)

## 2018-06-18 LAB — URINALYSIS, ROUTINE W REFLEX MICROSCOPIC
Bilirubin Urine: NEGATIVE
Glucose, UA: NEGATIVE mg/dL
Ketones, ur: NEGATIVE mg/dL
Leukocytes, UA: NEGATIVE
Nitrite: NEGATIVE
Protein, ur: NEGATIVE mg/dL
Specific Gravity, Urine: 1.016 (ref 1.005–1.030)
pH: 5 (ref 5.0–8.0)

## 2018-06-18 LAB — COMPREHENSIVE METABOLIC PANEL
ALT: 18 U/L (ref 0–44)
AST: 16 U/L (ref 15–41)
Albumin: 4 g/dL (ref 3.5–5.0)
Alkaline Phosphatase: 73 U/L (ref 38–126)
Anion gap: 8 (ref 5–15)
BUN: 10 mg/dL (ref 6–20)
CO2: 24 mmol/L (ref 22–32)
Calcium: 8.9 mg/dL (ref 8.9–10.3)
Chloride: 102 mmol/L (ref 98–111)
Creatinine, Ser: 0.72 mg/dL (ref 0.44–1.00)
GFR calc Af Amer: >60 mL/min (ref >60)
GFR calc non Af Amer: >60 mL/min (ref >60)
Glucose, Bld: 88 mg/dL (ref 70–99)
Potassium: 4 mmol/L (ref 3.5–5.1)
Sodium: 134 mmol/L — ABNORMAL LOW (ref 135–145)
Total Bilirubin: 0.7 mg/dL (ref 0.3–1.2)
Total Protein: 7.4 g/dL (ref 6.5–8.1)

## 2018-06-18 LAB — HCG, QUANTITATIVE, PREGNANCY: hCG, Beta Chain, Quant, S: <1 m[IU]/mL (ref <5)

## 2018-06-18 LAB — POCT PREGNANCY, URINE: Preg Test, Ur: NEGATIVE

## 2018-06-18 MED ORDER — NAPROXEN 500 MG PO TABS: 500.0000 mg | ORAL_TABLET | Freq: Two times a day (BID) | ORAL | 1 refills | Status: DC

## 2018-06-18 MED ORDER — CYCLOBENZAPRINE HCL 10 MG PO TABS: 10.0000 mg | ORAL_TABLET | Freq: Three times a day (TID) | ORAL | 1 refills | Status: DC | PRN

## 2018-06-18 MED ORDER — KETOROLAC TROMETHAMINE 60 MG/2ML IM SOLN
60.0000 mg | Freq: Once | INTRAMUSCULAR | Status: AC
  Administered 2018-06-18: 60 mg via INTRAMUSCULAR
  Filled 2018-06-18: qty 2

## 2018-06-18 MED ORDER — ONDANSETRON 8 MG PO TBDP
8.0000 mg | ORAL_TABLET | Freq: Once | ORAL | Status: AC
  Administered 2018-06-18: 8 mg via ORAL
  Filled 2018-06-18: qty 1

## 2018-06-18 NOTE — MAU Provider Note (Signed)
History     CSN: [id number]  Arrival date and time: 06/18/18 4235   First Provider Initiated Contact with Patient 06/18/18 [phone]      Chief Complaint  Patient presents with  . Abdominal Pain  . Back Pain  . Emesis  . Dizziness   Emily Morales is a 27 y.o. G2P2 not currently pregnant who presents to MAU with complaints of abdominal pain, back pain, emesis and dizziness. Reports she is concerned she is pregnant, states "urine pregnancy test are always negative for me". She denies taking a HPT. She reports abdominal pain and back pain has been occurring intermittently for the past 1-2 weeks. Describes the pain as occasional aching, rates pain 3/10- has not taken any medication for pain. She reports having one occurrence of dizziness and emesis while at work today. She denies LOC or weakness being associated with symptoms. She denies vaginal bleeding, discharge, urinary symptoms or HA. Hx of abnormal uterine bleeding with her LMP being September. Has been followed by her OBGYN for AUB.    OB History    Gravida  2   Para  2   Term  2   Preterm  0   AB  0   Living  2     SAB  0   TAB  0   Ectopic  0   Multiple  0   Live Births  2           Past Medical History:  Diagnosis Date  . Abnormal Pap smear   . Anemia   . Anxiety   . Depression   . Dysrhythmia    irregular heartrate  . GERD (gastroesophageal reflux disease)   . Headache(784.0)   . Hx of chlamydia infection   . Hypothyroidism   . Ovarian cyst, right   . PCOS (polycystic ovarian syndrome)   . Perimenopausal   . Uterine fibroid     Past Surgical History:  Procedure Laterality Date  . BIOPSY  11/14/2016   Procedure: BIOPSY;  Surgeon: Danie Binder, MD;  Location: [facility];  Service: Endoscopy;;  duodenal gastric  . COLPOSCOPY    . ESOPHAGOGASTRODUODENOSCOPY (EGD) WITH PROPOFOL N/A 11/14/2016   Procedure: ESOPHAGOGASTRODUODENOSCOPY (EGD) WITH PROPOFOL;  Surgeon: Danie Binder, MD;   Location: [facility];  Service: Endoscopy;  Laterality: N/A;  230   . lipoma surgery    . MASS EXCISION N/A 02/28/2017   Procedure: EXCISION LIPOMA SCALP 3CM;  Surgeon: Aviva Signs, MD;  Location: [facility];  Service: General;  Laterality: N/A;    Family History  Problem Relation Age of Onset  . Diabetes Father   . Hypertension Father   . Other Son        reactive airway disease  . Asthma Son   . Heart murmur Son   . Cancer Maternal Grandmother        lung  . Heart disease Paternal Grandmother   . Liver disease Paternal Grandfather        etoh related  . Colon cancer Neg Hx     Social History   Tobacco Use  . Smoking status: Former Smoker    Types: Cigarettes  . Smokeless tobacco: Never Used  Substance Use Topics  . Alcohol use: Yes    Comment: occ  . Drug use: Not Currently    Types: Marijuana    Comment: rarely    Allergies:  Allergies  Allergen Reactions  . Pollen Extract Cough  . Latex  Rash  . Other Rash    Coconut causes a rash    Medications Prior to Admission  Medication Sig Dispense Refill Last Dose  . Aspirin-Acetaminophen-Caffeine (GOODY HEADACHE PO) Take 1 packet by mouth daily as needed (headache).   06/15/2018  . clindamycin (CLEOCIN T) 1 % lotion Apply topically 2 (two) times daily. 60 mL 0 06/18/2018 at Unknown time  . minocycline (DYNACIN) 50 MG tablet Take 1 tablet (50 mg total) by mouth 2 (two) times daily. 20 tablet 0 06/18/2018 at Unknown time  . sertraline (ZOLOFT) 50 MG tablet Take 1 tablet (50 mg total) by mouth daily. 30 tablet 6 06/18/2018 at Unknown time  . zolpidem (AMBIEN) 10 MG tablet Take 1 tablet (10 mg total) by mouth at bedtime as needed for sleep. 30 tablet 3 06/17/2018 at Unknown time  . etonogestrel-ethinyl estradiol (NUVARING) 0.12-0.015 MG/24HR vaginal ring Insert vaginally and leave in place for 3 1/2 consecutive weeks, then remove for 4 days. Repeat. (Patient not taking: Reported on 06/18/2018) 3 each 4 Not Taking at Unknown  time  . nitrofurantoin, macrocrystal-monohydrate, (MACROBID) 100 MG capsule Take 1 capsule (100 mg total) by mouth 2 (two) times daily. 1 po BId (Patient not taking: Reported on 06/18/2018) 10 capsule 0 Not Taking at Unknown time  . predniSONE (DELTASONE) 10 MG tablet Take 1 tablet (10 mg total) by mouth as directed. Taper 6,5,4,3,2,1 (Patient not taking: Reported on 06/18/2018) 21 tablet 0 Completed Course at Unknown time    Review of Systems  Respiratory: Negative.   Cardiovascular: Negative.   Gastrointestinal: Positive for abdominal pain and vomiting. Negative for constipation, diarrhea and nausea.  Genitourinary: Negative.   Musculoskeletal: Positive for back pain.  Neurological: Positive for dizziness.   Physical Exam   Blood pressure 125/79, pulse 94, temperature 98.3 F (36.8 C), temperature source Oral, resp. rate 18, height '5\' 5"'  (1.651 m), weight 100.7 kg, last menstrual period 04/06/2018.  Physical Exam  Nursing note and vitals reviewed. Constitutional: She is oriented to person, place, and time. She appears well-developed and well-nourished. No distress.  Cardiovascular: Normal rate, regular rhythm and normal heart sounds.  Respiratory: Effort normal and breath sounds normal. No respiratory distress. She has no wheezes.  GI: Soft. Bowel sounds are normal. She exhibits no distension. There is no tenderness. There is no rebound and no guarding.  Neurological: She is alert and oriented to person, place, and time.  Psychiatric: She has a normal mood and affect. Her behavior is normal. Thought content normal.    MAU Course  Procedures  MDM Orders Placed This Encounter  Procedures  . Urinalysis, Routine w reflex microscopic  . CBC with Differential/Platelet  . hCG, quantitative, pregnancy  . Comprehensive metabolic panel  . Pregnancy, urine POC   Results for orders placed or performed during the hospital encounter of 06/18/18 (from the past 48 hour(s))  Urinalysis,  Routine w reflex microscopic     Status: Abnormal   Collection Time: 06/18/18  8:46 AM  Result Value Ref Range   Color, Urine YELLOW YELLOW   APPearance CLEAR CLEAR   Specific Gravity, Urine 1.016 1.005 - 1.030   pH 5.0 5.0 - 8.0   Glucose, UA NEGATIVE NEGATIVE mg/dL   Hgb urine dipstick MODERATE (A) NEGATIVE   Bilirubin Urine NEGATIVE NEGATIVE   Ketones, ur NEGATIVE NEGATIVE mg/dL   Protein, ur NEGATIVE NEGATIVE mg/dL   Nitrite NEGATIVE NEGATIVE   Leukocytes, UA NEGATIVE NEGATIVE    Comment: Performed at [facility], [place]  [address] [place], [place] [address]  Pregnancy, urine POC     Status: None   Collection Time: 06/18/18  8:48 AM  Result Value Ref Range   Preg Test, Ur NEGATIVE NEGATIVE    Comment:        THE SENSITIVITY OF THIS METHODOLOGY IS >24 mIU/mL   CBC with Differential/Platelet     Status: Abnormal   Collection Time: 06/18/18  9:34 AM  Result Value Ref Range   WBC 4.2 4.0 - 10.5 K/uL   RBC 4.18 3.87 - 5.11 MIL/uL   Hemoglobin 11.0 (L) 12.0 - 15.0 g/dL   HCT 34.1 (L) 36.0 - 46.0 %   MCV 81.6 80.0 - 100.0 fL   MCH 26.3 26.0 - 34.0 pg   MCHC 32.3 30.0 - 36.0 g/dL   RDW 14.6 11.5 - 15.5 %   Platelets 235 150 - 400 K/uL   nRBC 0.0 0.0 - 0.2 %   Neutrophils Relative % 54 %   Neutro Abs 2.3 1.7 - 7.7 K/uL   Lymphocytes Relative 38 %   Lymphs Abs 1.6 0.7 - 4.0 K/uL   Monocytes Relative 5 %   Monocytes Absolute 0.2 0.1 - 1.0 K/uL   Eosinophils Relative 3 %   Eosinophils Absolute 0.1 0.0 - 0.5 K/uL   Basophils Relative 0 %   Basophils Absolute 0.0 0.0 - 0.1 K/uL    Comment: Performed at [facility], [address], [place], [place] [address]  hCG, quantitative, pregnancy     Status: None   Collection Time: 06/18/18  9:37 AM  Result Value Ref Range   hCG, Beta Chain, Quant, S <1 <5 mIU/mL    Comment:          GEST. AGE      CONC.  (mIU/mL)   <=1 WEEK        5 - 50     2 WEEKS       50 - 500     3 WEEKS       100 - 10,000     4 WEEKS     1,000 -  30,000     5 WEEKS     3,500 - 115,000   6-8 WEEKS     12,000 - 270,000    12 WEEKS     15,000 - 220,000        FEMALE AND NON-PREGNANT FEMALE:     LESS THAN 5 mIU/mL Performed at [facility], [address], [place], [place] [address]   Comprehensive metabolic panel     Status: Abnormal   Collection Time: 06/18/18  9:37 AM  Result Value Ref Range   Sodium 134 (L) 135 - 145 mmol/L   Potassium 4.0 3.5 - 5.1 mmol/L   Chloride 102 98 - 111 mmol/L   CO2 24 22 - 32 mmol/L   Glucose, Bld 88 70 - 99 mg/dL   BUN 10 6 - 20 mg/dL   Creatinine, Ser 0.72 0.44 - 1.00 mg/dL   Calcium 8.9 8.9 - 10.3 mg/dL   Total Protein 7.4 6.5 - 8.1 g/dL   Albumin 4.0 3.5 - 5.0 g/dL   AST 16 15 - 41 U/L   ALT 18 0 - 44 U/L   Alkaline Phosphatase 73 38 - 126 U/L   Total Bilirubin 0.7 0.3 - 1.2 mg/dL   GFR calc non Af Amer >60 >60 mL/min   GFR calc Af Amer >60 >60 mL/min    Comment: (NOTE) The eGFR has  been calculated using the CKD EPI equation. This calculation has not been validated in all clinical situations. eGFR's persistently <60 mL/min signify possible Chronic Kidney Disease.    Anion gap 8 5 - 15    Comment: Performed at [facility], [place], [place] [address]   CBC, CMP, and UA: WNL  HCG negative  UPT negative   Educated and discussed results of labs with patient. Treatments in MAU included Toradol 41m IM and Zofran 847mODT. Upon reassessment patient reports pain is relieved with medication. Denies dizziness or N/V symptoms. Educated and discussed reasons to return to MAU, f/u as scheduled with PCP and OBGYN. Pt stable at time of discharge.   Assessment and Plan   1. Not currently pregnant   2. Nausea and vomiting in adult   3. Abdominal cramping   4. History of abnormal uterine bleeding   5. Episode of dizziness    Discharge home  F/u as scheduled with PCP and OBGYN  Return to MAU as needed  Educated and discussed AUB   Follow-up Information    GoDoree AlbeeMD Follow up.   Specialty:  Internal Medicine Why:  Follow up with PCP as scheduled  Contact information: [phone]        [facility] Follow up.   Specialty:  Obstetrics and Gynecology Why:  Follow up with OBGYN as scheduled  Contact information: [id number] [phone]        Allergies as of 06/18/2018      Reactions   Pollen Extract Cough   Latex Rash   Other Rash   Coconut causes a rash      Medication List    TAKE these medications   clindamycin 1 % lotion Commonly known as:  CLEOCIN T Apply topically 2 (two) times daily.   etonogestrel-ethinyl estradiol 0.12-0.015 MG/24HR vaginal ring Commonly known as:  NUVARING Insert vaginally and leave in place for 3 1/2 consecutive weeks, then remove for 4 days. Repeat.   GOODY HEADACHE PO Take 1 packet by mouth daily as needed (headache).   minocycline 50 MG tablet Commonly known as:  DYNACIN Take 1 tablet (50 mg total) by mouth 2 (two) times daily.   nitrofurantoin (macrocrystal-monohydrate) 100 MG capsule Commonly known as:  MACROBID Take 1 capsule (100 mg total) by mouth 2 (two) times daily. 1 po BId   predniSONE 10 MG tablet Commonly known as:  DELTASONE Take 1 tablet (10 mg total) by mouth as directed. Taper 6,5,4,3,2,1   sertraline 50 MG tablet Commonly known as:  ZOLOFT Take 1 tablet (50 mg total) by mouth daily.   zolpidem 10 MG tablet Commonly known as:  AMBIEN Take 1 tablet (10 mg total) by mouth at bedtime as needed for sleep.       VeLajean ManesNM 06/18/2018, 11:32 AM

## 2018-06-18 NOTE — MAU Note (Signed)
Pt became dizzy at work today, vomited, nauseated after eating x 2 weeks.  C/O lower back pain since yesterday, abdominal pain for the last week.  Denies diarrhea.

## 2018-06-18 NOTE — Progress Notes (Signed)

## 2018-07-02 ENCOUNTER — Encounter: Payer: Self-pay | Admitting: Obstetrics & Gynecology

## 2018-07-02 ENCOUNTER — Encounter

## 2018-07-02 ENCOUNTER — Ambulatory Visit (INDEPENDENT_AMBULATORY_CARE_PROVIDER_SITE_OTHER): Payer: 59 | Admitting: Obstetrics & Gynecology

## 2018-07-02 ENCOUNTER — Other Ambulatory Visit (HOSPITAL_COMMUNITY)
Admission: RE | Admit: 2018-07-02 | Discharge: 2018-07-02 | Disposition: A | Discharge status: Home / Self Care | Payer: 59 | Source: Ambulatory Visit | Attending: Obstetrics & Gynecology | Admitting: Obstetrics & Gynecology

## 2018-07-02 VITALS — BP 119/78 | HR 87 | Ht 66.0 in | Wt 213.0 lb

## 2018-07-02 DIAGNOSIS — Z01419 Encounter for gynecological examination (general) (routine) without abnormal findings: Secondary | ICD-10-CM | POA: Diagnosis not present

## 2018-07-02 MED ORDER — CLOMIPHENE CITRATE 50 MG PO TABS: 50.0000 mg | ORAL_TABLET | Freq: Every day | ORAL | 4 refills | Status: DC

## 2018-07-02 MED ORDER — MEDROXYPROGESTERONE ACETATE 10 MG PO TABS: 10.0000 mg | ORAL_TABLET | Freq: Every day | ORAL | 0 refills | Status: DC

## 2018-07-02 NOTE — Progress Notes (Signed)
Subjective:     Emily Morales is a 27 y.o. female here for a routine exam.  No LMP recorded. (Menstrual status: Irregular Periods). [id number] Birth Control Method:  none Menstrual Calendar(currently): irregular  Current complaints: dysmenorrhea.   Current acute medical issues:  none   Recent Gynecologic History No LMP recorded. (Menstrual status: Irregular Periods). Last Pap: 2018,  normal Last mammogram: ,    Past Medical History:  Diagnosis Date  . Abnormal Pap smear   . Anemia   . Anxiety   . Depression   . Dysrhythmia    irregular heartrate  . GERD (gastroesophageal reflux disease)   . Headache(784.0)   . Hx of chlamydia infection   . Hypothyroidism   . Ovarian cyst, right   . PCOS (polycystic ovarian syndrome)   . Perimenopausal   . Uterine fibroid     Past Surgical History:  Procedure Laterality Date  . BIOPSY  11/14/2016   Procedure: BIOPSY;  Surgeon: Danie Binder, MD;  Location: [facility];  Service: Endoscopy;;  duodenal gastric  . COLPOSCOPY    . ESOPHAGOGASTRODUODENOSCOPY (EGD) WITH PROPOFOL N/A 11/14/2016   Procedure: ESOPHAGOGASTRODUODENOSCOPY (EGD) WITH PROPOFOL;  Surgeon: Danie Binder, MD;  Location: [facility];  Service: Endoscopy;  Laterality: N/A;  230   . lipoma surgery    . MASS EXCISION N/A 02/28/2017   Procedure: EXCISION LIPOMA SCALP 3CM;  Surgeon: Aviva Signs, MD;  Location: [facility];  Service: General;  Laterality: N/A;    OB History    Gravida  2   Para  2   Term  2   Preterm  0   AB  0   Living  2     SAB  0   TAB  0   Ectopic  0   Multiple  0   Live Births  2           Social History   Socioeconomic History  . Marital status: Married    Spouse name: Not on file  . Number of children: Not on file  . Years of education: Not on file  . Highest education level: Not on file  Occupational History  . Occupation: Theatre stage manager Needs  . Financial resource strain: Not on file  . Food insecurity:   Worry: Not on file    Inability: Not on file  . Transportation needs:    Medical: Not on file    Non-medical: Not on file  Tobacco Use  . Smoking status: Former Smoker    Types: Cigarettes  . Smokeless tobacco: Never Used  Substance and Sexual Activity  . Alcohol use: Yes    Comment: occ  . Drug use: Not Currently    Types: Marijuana    Comment: rarely  . Sexual activity: Yes    Partners: Male    Birth control/protection: None  Lifestyle  . Physical activity:    Days per week: Not on file    Minutes per session: Not on file  . Stress: Not on file  Relationships  . Social connections:    Talks on phone: Not on file    Gets together: Not on file    Attends religious service: Not on file    Active member of club or organization: Not on file    Attends meetings of clubs or organizations: Not on file    Relationship status: Not on file  Other Topics Concern  . Not on file  Social History Narrative  .  Not on file    Family History  Problem Relation Age of Onset  . Diabetes Father   . Hypertension Father   . Other Son        reactive airway disease  . Asthma Son   . Heart murmur Son   . Cancer Maternal Grandmother        lung  . Heart disease Paternal Grandmother   . Liver disease Paternal Grandfather        etoh related  . Colon cancer Neg Hx      Current Outpatient Medications:  .  Aspirin-Acetaminophen-Caffeine (GOODY HEADACHE PO), Take 1 packet by mouth daily as needed (headache)., Disp: , Rfl:  .  clindamycin (CLEOCIN T) 1 % lotion, Apply topically 2 (two) times daily., Disp: 60 mL, Rfl: 0 .  sertraline (ZOLOFT) 50 MG tablet, Take 1 tablet (50 mg total) by mouth daily., Disp: 30 tablet, Rfl: 6 .  zolpidem (AMBIEN) 10 MG tablet, Take 1 tablet (10 mg total) by mouth at bedtime as needed for sleep., Disp: 30 tablet, Rfl: 3 .  clomiPHENE (CLOMID) 50 MG tablet, Take 1 tablet (50 mg total) by mouth daily., Disp: 5 tablet, Rfl: 4 .  medroxyPROGESTERone (PROVERA) 10  MG tablet, Take 1 tablet (10 mg total) by mouth daily., Disp: 10 tablet, Rfl: 0  Review of Systems  Review of Systems  Constitutional: Negative for fever, chills, weight loss, malaise/fatigue and diaphoresis.  HENT: Negative for hearing loss, ear pain, nosebleeds, congestion, sore throat, neck pain, tinnitus and ear discharge.   Eyes: Negative for blurred vision, double vision, photophobia, pain, discharge and redness.  Respiratory: Negative for cough, hemoptysis, sputum production, shortness of breath, wheezing and stridor.   Cardiovascular: Negative for chest pain, palpitations, orthopnea, claudication, leg swelling and PND.  Gastrointestinal: negative for abdominal pain. Negative for heartburn, nausea, vomiting, diarrhea, constipation, blood in stool and melena.  Genitourinary: Negative for dysuria, urgency, frequency, hematuria and flank pain.  Musculoskeletal: Negative for myalgias, back pain, joint pain and falls.  Skin: Negative for itching and rash.  Neurological: Negative for dizziness, tingling, tremors, sensory change, speech change, focal weakness, seizures, loss of consciousness, weakness and headaches.  Endo/Heme/Allergies: Negative for environmental allergies and polydipsia. Does not bruise/bleed easily.  Psychiatric/Behavioral: Negative for depression, suicidal ideas, hallucinations, memory loss and substance abuse. The patient is not nervous/anxious and does not have insomnia.        Objective:  Blood pressure 119/78, pulse 87, height 5\' 6"  (1.676 m), weight 213 lb (96.6 kg).   Physical Exam  Vitals reviewed. Constitutional: She is oriented to person, place, and time. She appears well-developed and well-nourished.  HENT:  Head: Normocephalic and atraumatic.        Right Ear: External ear normal.  Left Ear: External ear normal.  Nose: Nose normal.  Mouth/Throat: Oropharynx is clear and moist.  Eyes: Conjunctivae and EOM are normal. Pupils are equal, round, and reactive  to light. Right eye exhibits no discharge. Left eye exhibits no discharge. No scleral icterus.  Neck: Normal range of motion. Neck supple. No tracheal deviation present. No thyromegaly present.  Cardiovascular: Normal rate, regular rhythm, normal heart sounds and intact distal pulses.  Exam reveals no gallop and no friction rub.   No murmur heard. Respiratory: Effort normal and breath sounds normal. No respiratory distress. She has no wheezes. She has no rales. She exhibits no tenderness.  GI: Soft. Bowel sounds are normal. She exhibits no distension and no mass. There is no tenderness.  There is no rebound and no guarding.  Genitourinary:  Breasts no masses skin changes or nipple changes bilaterally      Vulva is normal without lesions Vagina is pink moist without discharge Cervix normal in appearance and pap is done Uterus is normal size shape and contour Adnexa is negative with normal sized ovaries   Musculoskeletal: Normal range of motion. She exhibits no edema and no tenderness.  Neurological: She is alert and oriented to person, place, and time. She has normal reflexes. She displays normal reflexes. No cranial nerve deficit. She exhibits normal muscle tone. Coordination normal.  Skin: Skin is warm and dry. No rash noted. No erythema. No pallor.  Psychiatric: She has a normal mood and affect. Her behavior is normal. Judgment and thought content normal.       Medications Ordered at today's visit: Meds ordered this encounter  Medications  . medroxyPROGESTERone (PROVERA) 10 MG tablet    Sig: Take 1 tablet (10 mg total) by mouth daily.    Dispense:  10 tablet    Refill:  0  . clomiPHENE (CLOMID) 50 MG tablet    Sig: Take 1 tablet (50 mg total) by mouth daily.    Dispense:  5 tablet    Refill:  4    Other orders placed at today's visit: No orders of the defined types were placed in this encounter.     Assessment:    Healthy female exam.    Plan:    provera x 10 days to  cycle endometrium   Clomid for ovulation induction  Return if symptoms worsen or fail to improve.

## 2018-07-04 LAB — CYTOLOGY - PAP
CHLAMYDIA, DNA PROBE: NEGATIVE
DIAGNOSIS: NEGATIVE
Neisseria Gonorrhea: NEGATIVE

## 2018-07-30 ENCOUNTER — Other Ambulatory Visit: Payer: Self-pay | Admitting: Obstetrics & Gynecology

## 2018-07-30 MED ORDER — NORETHIN ACE-ETH ESTRAD-FE 1-20 MG-MCG(24) PO TABS: 1.0000 | ORAL_TABLET | Freq: Every day | ORAL | 0 refills | Status: DC

## 2018-08-07 ENCOUNTER — Ambulatory Visit: Payer: No Typology Code available for payment source | Admitting: Orthopedic Surgery

## 2018-08-07 ENCOUNTER — Ambulatory Visit (INDEPENDENT_AMBULATORY_CARE_PROVIDER_SITE_OTHER): Payer: No Typology Code available for payment source

## 2018-08-07 ENCOUNTER — Encounter: Payer: Self-pay | Admitting: Orthopedic Surgery

## 2018-08-07 VITALS — BP 121/79 | HR 91 | Ht 66.0 in | Wt 225.0 lb

## 2018-08-07 DIAGNOSIS — M545 Low back pain, unspecified: Secondary | ICD-10-CM

## 2018-08-07 DIAGNOSIS — G8929 Other chronic pain: Secondary | ICD-10-CM | POA: Diagnosis not present

## 2018-08-07 MED ORDER — GABAPENTIN 100 MG PO CAPS: 100.0000 mg | ORAL_CAPSULE | Freq: Three times a day (TID) | ORAL | 2 refills | Status: DC

## 2018-08-07 MED ORDER — PREDNISONE 10 MG (48) PO TBPK: ORAL_TABLET | Freq: Every day | ORAL | 0 refills | Status: DC

## 2018-08-07 NOTE — Patient Instructions (Signed)

## 2018-08-07 NOTE — Addendum Note (Signed)
Addended byCandice Camp on: 08/07/2018 10:46 AM   Modules accepted: Orders

## 2018-08-07 NOTE — Progress Notes (Signed)
NEW PATIENT OFFICE VISIT  Chief Complaint  Patient presents with  . Knee Pain  . Hip Pain    28 year old female I saw 3 years ago for patellofemoral pain syndrome presents with 1 year history of pain in the right hip radiating to the right knee.  She has reported no trauma.  She was treated with naproxen Mobic and a muscle relaxer without any improvement  She rates her right hip and radicular pain are 10 out of 10; she describes a dull aching pain in the right leg radiating to the right knee  She says when she sits for a long time and stands up it is hard to get going  Pain does not go below the knee she has a lot of back pain 7 out of 10 daily  No physical therapy has been ordered     Review of Systems  Constitutional: Negative for fever.  Gastrointestinal: Negative.   Genitourinary: Negative.   Musculoskeletal: Positive for back pain and joint pain. Negative for falls.  Skin: Negative.   Neurological: Negative for tingling, tremors, sensory change, speech change, focal weakness and weakness.     Past Medical History:  Diagnosis Date  . Abnormal Pap smear   . Anemia   . Anxiety   . Depression   . Dysrhythmia    irregular heartrate  . GERD (gastroesophageal reflux disease)   . Headache(784.0)   . Hx of chlamydia infection   . Hypothyroidism   . Ovarian cyst, right   . PCOS (polycystic ovarian syndrome)   . Perimenopausal   . Uterine fibroid     Past Surgical History:  Procedure Laterality Date  . BIOPSY  11/14/2016   Procedure: BIOPSY;  Surgeon: Danie Binder, MD;  Location: [facility];  Service: Endoscopy;;  duodenal gastric  . COLPOSCOPY    . ESOPHAGOGASTRODUODENOSCOPY (EGD) WITH PROPOFOL N/A 11/14/2016   Procedure: ESOPHAGOGASTRODUODENOSCOPY (EGD) WITH PROPOFOL;  Surgeon: Danie Binder, MD;  Location: [facility];  Service: Endoscopy;  Laterality: N/A;  230   . lipoma surgery    . MASS EXCISION N/A 02/28/2017   Procedure: EXCISION LIPOMA SCALP 3CM;   Surgeon: Aviva Signs, MD;  Location: [facility];  Service: General;  Laterality: N/A;    Family History  Problem Relation Age of Onset  . Diabetes Father   . Hypertension Father   . Other Son        reactive airway disease  . Asthma Son   . Heart murmur Son   . Cancer Maternal Grandmother        lung  . Heart disease Paternal Grandmother   . Liver disease Paternal Grandfather        etoh related  . Colon cancer Neg Hx    Social History   Tobacco Use  . Smoking status: Former Smoker    Types: Cigarettes  . Smokeless tobacco: Never Used  Substance Use Topics  . Alcohol use: Yes    Comment: occ  . Drug use: Not Currently    Types: Marijuana    Comment: rarely    Allergies  Allergen Reactions  . Pollen Extract Cough  . Latex Rash  . Other Rash    Coconut causes a rash    Current Meds  Medication Sig  . Norethindrone Acetate-Ethinyl Estrad-FE (LOESTRIN 24 FE) 1-20 MG-MCG(24) tablet Take 1 tablet by mouth daily.  . sertraline (ZOLOFT) 50 MG tablet Take 1 tablet (50 mg total) by mouth daily.  Marland Kitchen zolpidem (AMBIEN)  10 MG tablet Take 1 tablet (10 mg total) by mouth at bedtime as needed for sleep.    BP 121/79   Pulse 91   Ht 5\' 6"  (1.676 m)   Wt 225 lb (102.1 kg)   BMI 36.32 kg/m   Physical Exam Vitals signs reviewed.  Constitutional:      Appearance: She is well-developed.  Neurological:     Mental Status: She is alert and oriented to person, place, and time.     Motor: No weakness, tremor, atrophy or abnormal muscle tone.     Coordination: Coordination is intact. Heel to [facility] Test normal.     Gait: Gait normal.  Psychiatric:        Attention and Perception: Attention normal.        Mood and Affect: Mood and affect normal.        Speech: Speech normal.        Behavior: Behavior normal.        Thought Content: Thought content normal.        Judgment: Judgment normal.     Right Knee Exam   Muscle Strength  The patient has normal right knee  strength.  Tenderness  The patient is experiencing no tenderness.   Range of Motion  Extension: normal  Flexion: normal   Tests  McMurray:  Medial - negative Lateral - negative Varus: negative Valgus: negative Drawer:  Anterior - negative    Posterior - negative  Other  Erythema: absent Scars: absent Sensation: normal Pulse: present Swelling: none   Left Knee Exam   Muscle Strength  The patient has normal left knee strength.  Tenderness  The patient is experiencing no tenderness.   Range of Motion  Extension: normal  Flexion: normal   Tests  McMurray:  Medial - negative Lateral - negative Varus: negative Valgus: negative Drawer:  Anterior - negative     Posterior - negative  Other  Erythema: absent Scars: absent Sensation: normal Pulse: present Swelling: none   Back Exam   Tenderness  The patient is experiencing tenderness in the lumbar (Tenderness lower back nontender thoracic and cervical spine).  Comments:  Skin over the lumbar thoracic and cervical spine is normal.  Straight leg raise is positive on the right at 45 degrees negative on the left normal range of motion in both hips no atrophy is noted  She has normal sensation in both legs with 2+ ankle jerk reflexes 0 knee jerk reflexes bilaterally          MEDICAL DECISION SECTION  Xrays were done at [place] and sports medicine  My independent reading of xrays:  See under separate dictation  Patient has's truncal asymmetry and facet arthritis with spondylosis L4-S1  Encounter Diagnosis  Name Primary?  . Chronic midline low back pain, unspecified whether sciatica present Yes    PLAN: (Rx., injectx, surgery, frx, mri/ct) PT Patient education  Medications as listed    Meds ordered this encounter  Medications  . gabapentin (NEURONTIN) 100 MG capsule    Sig: Take 1 capsule (100 mg total) by mouth 3 (three) times daily.    Dispense:  90 capsule    Refill:  2  .  predniSONE (STERAPRED UNI-PAK 48 TAB) 10 MG (48) TBPK tablet    Sig: Take by mouth daily. 10 mg 12 days as directed    Dispense:  48 tablet    Refill:  0    Arther Abbott, MD  08/07/2018 10:02 AM

## 2018-08-12 ENCOUNTER — Telehealth: Payer: Self-pay | Admitting: Radiology

## 2018-08-12 DIAGNOSIS — M545 Low back pain, unspecified: Secondary | ICD-10-CM

## 2018-08-12 DIAGNOSIS — G8929 Other chronic pain: Secondary | ICD-10-CM

## 2018-08-12 IMAGING — US US SOFT TISSUE HEAD/NECK
1 series · 14 of 25 positions shown · non-contrast
Comparison: None.

CLINICAL DATA: Neck mass x4 weeks

EXAM:
THYROID ULTRASOUND
TECHNIQUE: Ultrasound examination of the thyroid gland and adjacent soft
tissues was performed.

[Series 1: us soft tissue head/neck · 0.04mm/px · 14 of 29 slices shown]
[im 1/29]
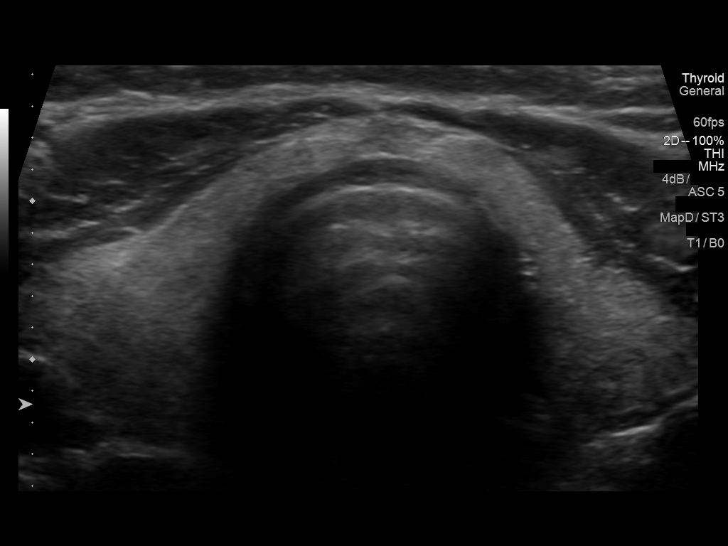
[im 3/29]
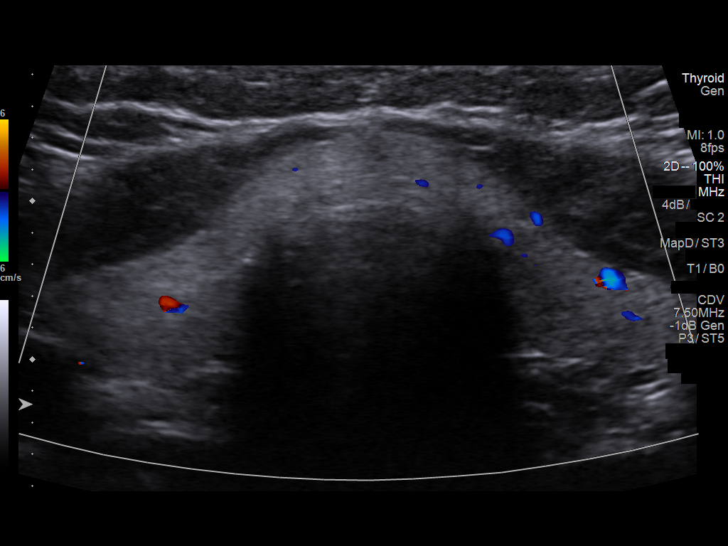
[im 5/29]
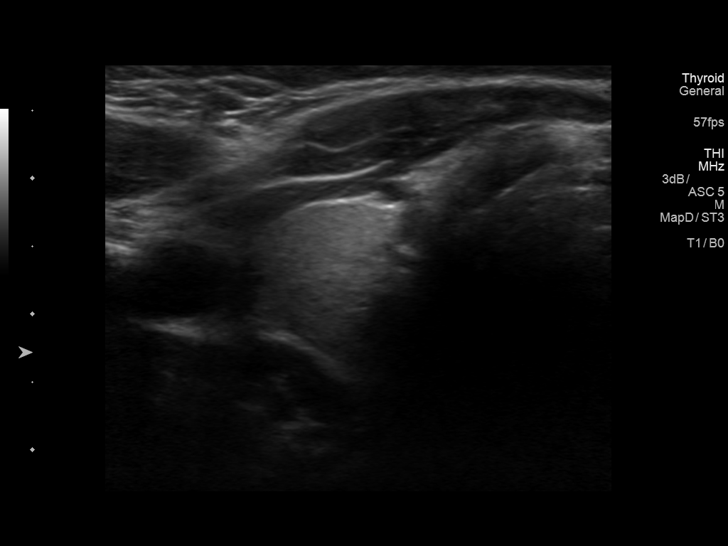
[im 8/29]
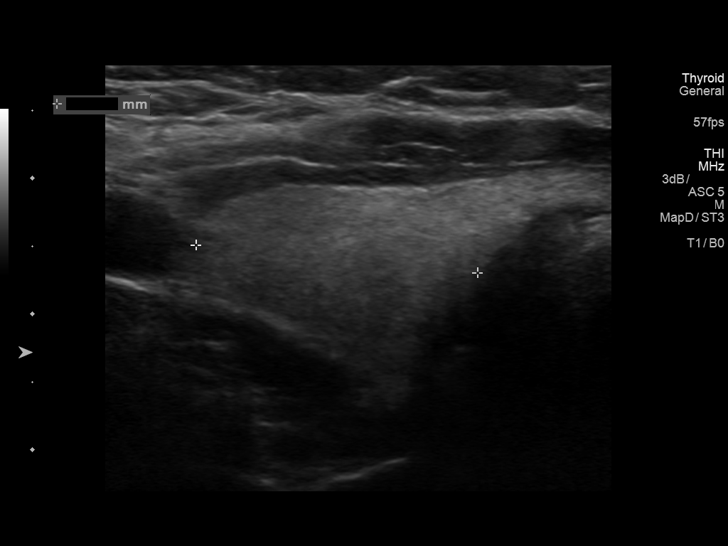
[im 10/29]
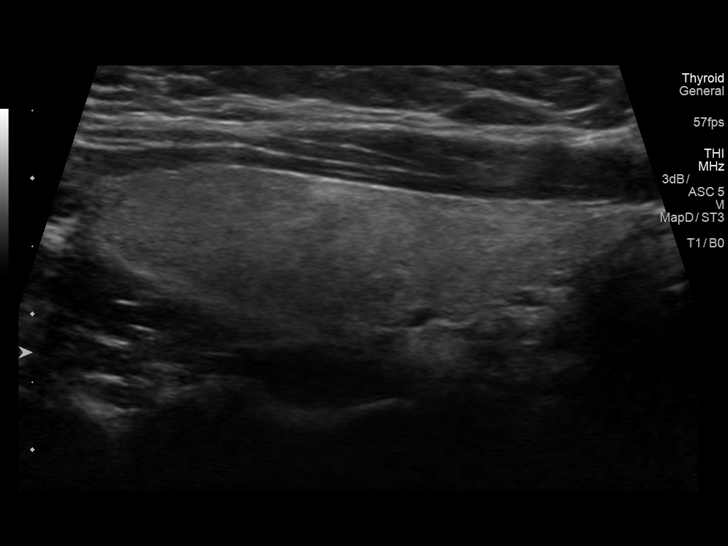
[im 11/29]
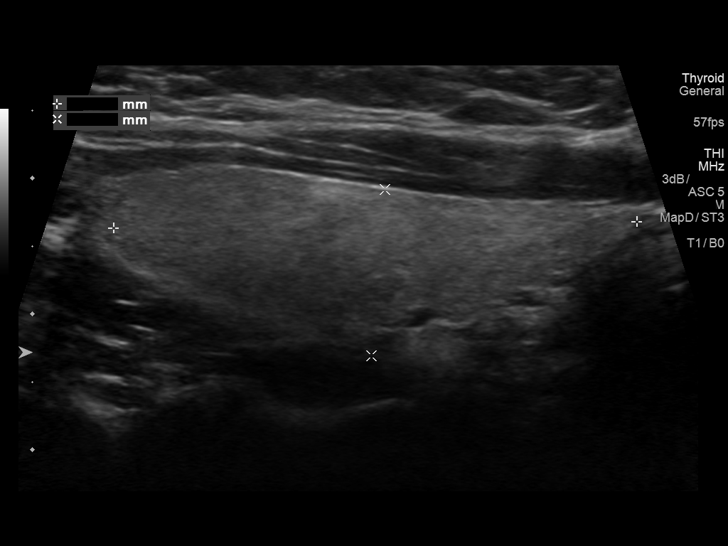
[im 13/29]
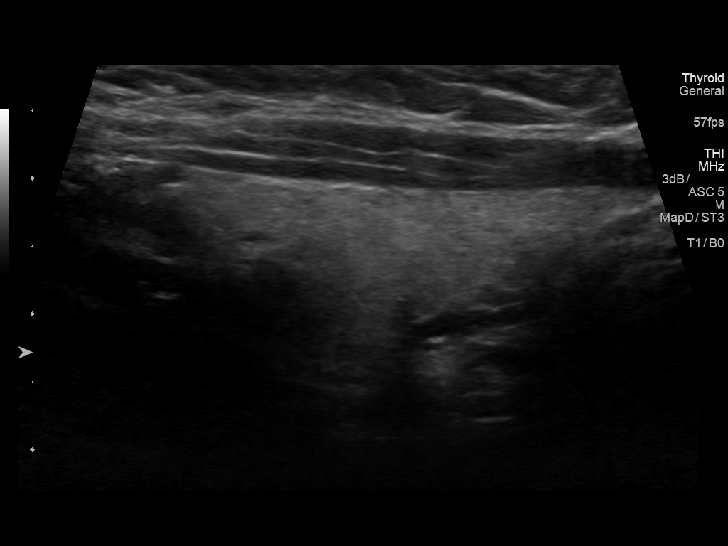
[im 16/29]
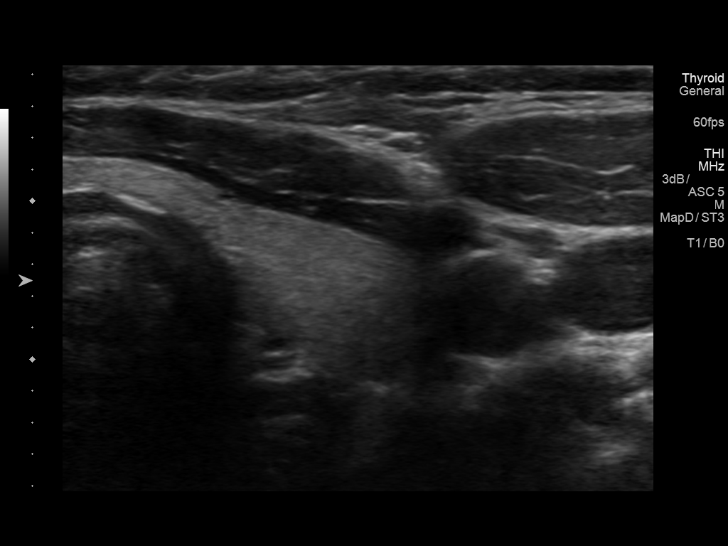
[im 18/29]
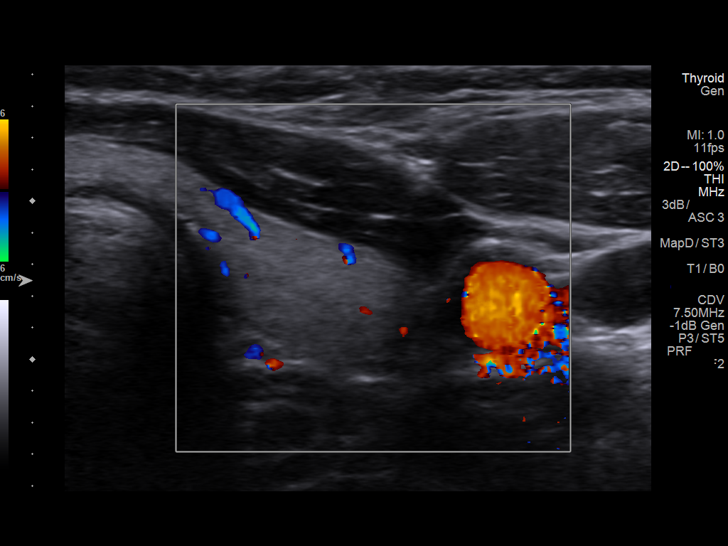
[im 19/29]
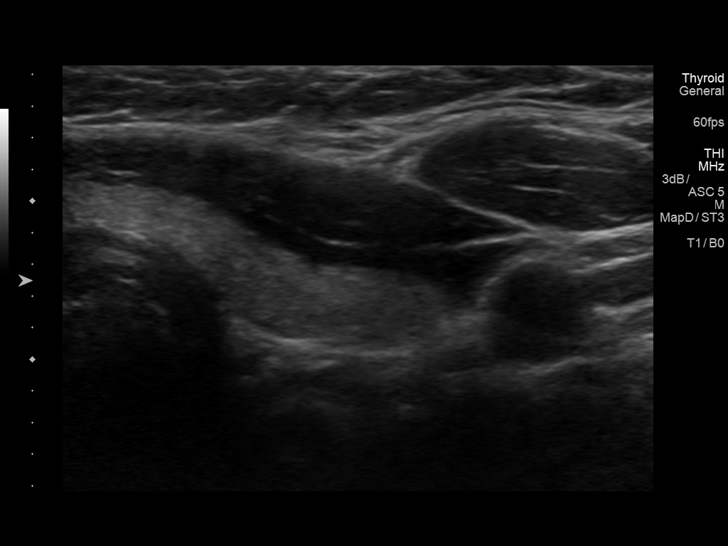
[im 22/29]
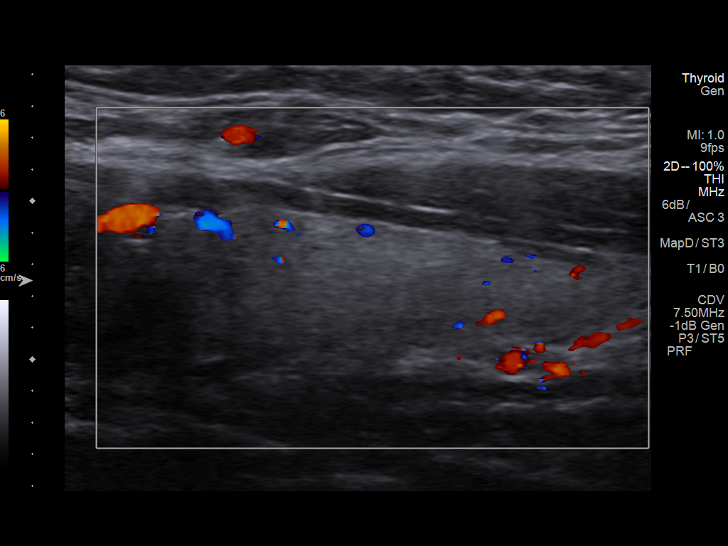
[im 24/29]
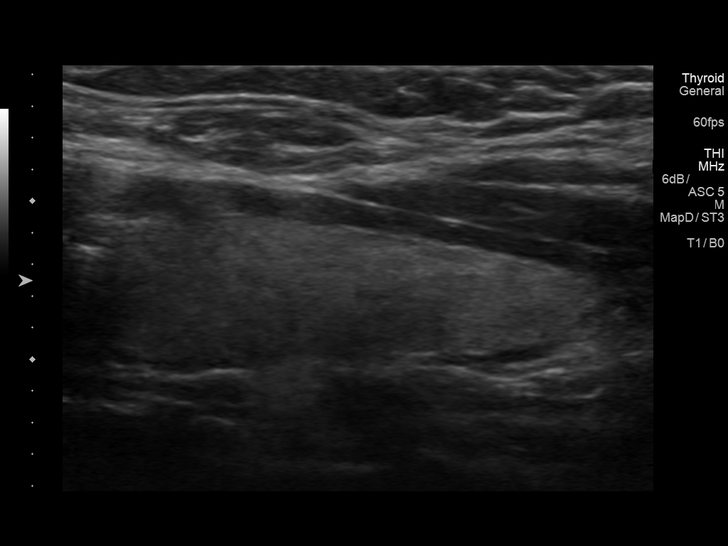
[im 26/29]
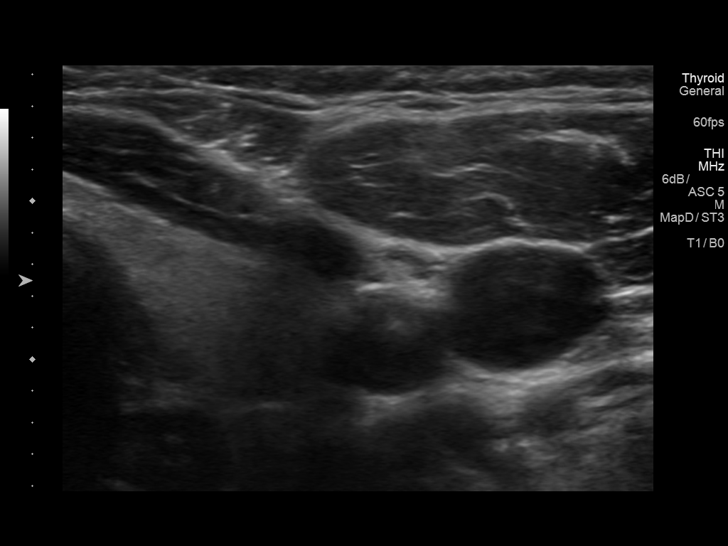
[im 29/29]
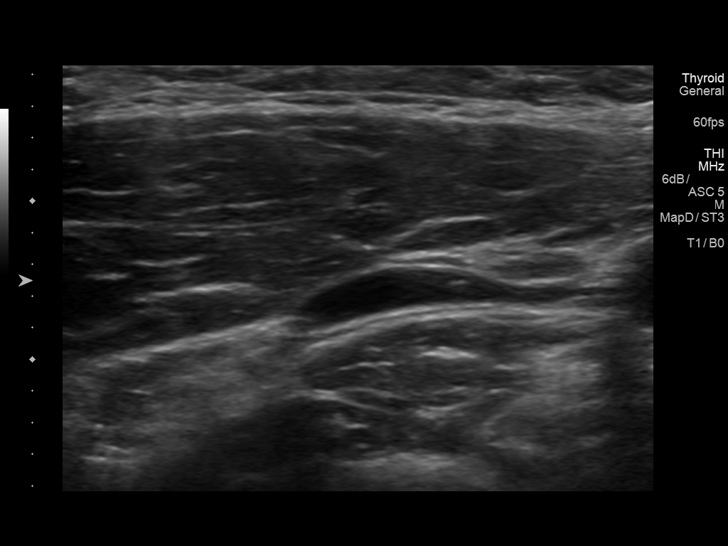

[14 of 25 positions shown; findings below may reference images not displayed]

FINDINGS: Parenchymal Echotexture: Mildly heterogenous

Isthmus: 0.3 cm thickness

Right lobe: 3.9 x 1.2 x 2.1 cm

Left lobe: 3.2 x 1.1 x 1.2 cm

_________________________________________________________

Estimated total number of nodules >/= 1 cm: 0

Number of spongiform nodules >/=  2 cm not described below (TR1): 0

Number of mixed cystic and solid nodules >/= 1.5 cm not described
below (TR2): 0

_________________________________________________________

No discrete nodules are seen within the thyroid gland.
IMPRESSION: 1. Normal-sized thyroid.  No nodule.

The above is in keeping with the ACR TI-RADS recommendations - [HOSPITAL] 6290;[DATE].

## 2018-08-12 NOTE — Telephone Encounter (Signed)
Dr Aline Brochure has ordered Lumbar MRI via my chart messaging with patient

## 2018-08-15 ENCOUNTER — Encounter: Payer: Self-pay | Admitting: Family Medicine

## 2018-08-15 ENCOUNTER — Ambulatory Visit (INDEPENDENT_AMBULATORY_CARE_PROVIDER_SITE_OTHER): Payer: No Typology Code available for payment source | Admitting: Family Medicine

## 2018-08-15 ENCOUNTER — Other Ambulatory Visit: Payer: Self-pay

## 2018-08-15 ENCOUNTER — Ambulatory Visit (HOSPITAL_COMMUNITY): Payer: No Typology Code available for payment source

## 2018-08-15 VITALS — BP 105/73 | HR 80 | Temp 98.2°F | Ht 66.2 in | Wt 225.0 lb

## 2018-08-15 DIAGNOSIS — E669 Obesity, unspecified: Secondary | ICD-10-CM | POA: Diagnosis not present

## 2018-08-15 DIAGNOSIS — F32A Depression, unspecified: Secondary | ICD-10-CM

## 2018-08-15 DIAGNOSIS — R0602 Shortness of breath: Secondary | ICD-10-CM

## 2018-08-15 DIAGNOSIS — F419 Anxiety disorder, unspecified: Secondary | ICD-10-CM | POA: Diagnosis not present

## 2018-08-15 DIAGNOSIS — Z7689 Persons encountering health services in other specified circumstances: Secondary | ICD-10-CM | POA: Diagnosis not present

## 2018-08-15 DIAGNOSIS — F329 Major depressive disorder, single episode, unspecified: Secondary | ICD-10-CM

## 2018-08-15 DIAGNOSIS — F5104 Psychophysiologic insomnia: Secondary | ICD-10-CM

## 2018-08-15 MED ORDER — TRAZODONE HCL 50 MG PO TABS: 25.0000 mg | ORAL_TABLET | Freq: Every evening | ORAL | 0 refills | Status: DC | PRN

## 2018-08-15 MED ORDER — TRAZODONE HCL 50 MG PO TABS: 25.0000 mg | ORAL_TABLET | Freq: Every evening | ORAL | 3 refills | Status: DC | PRN

## 2018-08-15 MED ORDER — FLUOXETINE HCL 20 MG PO CAPS: 20.0000 mg | ORAL_CAPSULE | Freq: Every day | ORAL | 0 refills | Status: DC

## 2018-08-15 MED ORDER — BUPROPION HCL ER (XL) 150 MG PO TB24: 150.0000 mg | ORAL_TABLET | Freq: Every day | ORAL | 0 refills | Status: DC

## 2018-08-15 NOTE — Progress Notes (Signed)
BP 105/73   Pulse 80   Temp 98.2 F (36.8 C) (Oral)   Ht 5' 6.2" (1.681 m)   Wt 225 lb (102.1 kg)   SpO2 99%   BMI 36.10 kg/m    Subjective:    Patient ID: Emily Morales, female    DOB: June 25, 1991, 28 y.o.   MRN: [id number]  HPI: Emily Morales is a 28 y.o. female  Chief Complaint  Patient presents with  . Establish Care    pt states she would like to talk about her shortness of breath, tireness and not sleeping well during the night   Here today to est care.   Main concern is her weight. Trying dieting and exercise but the number is not budging. Has never tried weight loss medicine in the past. Of note, she is currently trying to conceive.   GYN put her on ambien because she has trouble staying asleep. This doesn't seem to be helping at all. Has tried several OTC remedies without relief. States stress seems to play a huge part in the issue and she currently has a lot of stress right now. Denies apneic episodes.   Having issues with anxiety and depression for years now since marital issues. Was going to employee counseling which helped just a little and zoloft made her drowsy so she stopped. Has not been on anything else in the past. Denies SI/HI. Mostly feeling like she's down and lacking motivation.   SOB on exertion occasionally. Has had a spirometry in the past which was normal. No hx of allergies or asthma. Has had lots of CXRs and EKGs in the past for radiating chest pains which have all been normal as well.   Having an MRI for her back pain today, seeing sports medicine for that.   Relevant past medical, surgical, family and social history reviewed and updated as indicated. Interim medical history since our last visit reviewed. Allergies and medications reviewed and updated.  Review of Systems  Per HPI unless specifically indicated above     Objective:    BP 105/73   Pulse 80   Temp 98.2 F (36.8 C) (Oral)   Ht 5' 6.2" (1.681 m)   Wt 225 lb  (102.1 kg)   SpO2 99%   BMI 36.10 kg/m   Wt Readings from Last 3 Encounters:  08/15/18 225 lb (102.1 kg)  08/07/18 225 lb (102.1 kg)  07/02/18 213 lb (96.6 kg)    Physical Exam Vitals signs and nursing note reviewed.  Constitutional:      Appearance: Normal appearance. She is not ill-appearing.  HENT:     Head: Atraumatic.  Eyes:     Extraocular Movements: Extraocular movements intact.     Conjunctiva/sclera: Conjunctivae normal.  Neck:     Musculoskeletal: Normal range of motion and neck supple.  Cardiovascular:     Rate and Rhythm: Normal rate and regular rhythm.     Heart sounds: Normal heart sounds.  Pulmonary:     Effort: Pulmonary effort is normal.     Breath sounds: Normal breath sounds.  Musculoskeletal: Normal range of motion.  Skin:    General: Skin is warm and dry.  Neurological:     Mental Status: She is alert and oriented to person, place, and time.  Psychiatric:        Mood and Affect: Mood normal.        Thought Content: Thought content normal.        Judgment: Judgment normal.  Results for orders placed or performed in visit on 07/02/18  Cytology - PAP( [facility])  Result Value Ref Range   Adequacy      Satisfactory for evaluation  endocervical/transformation zone component PRESENT.   Diagnosis      NEGATIVE FOR INTRAEPITHELIAL LESIONS OR MALIGNANCY.   Chlamydia Negative    Neisseria gonorrhea Negative    Material Submitted CervicoVaginal Pap [ThinPrep Imaged]       Assessment & Plan:   Problem List Items Addressed This Visit      Other   Anxiety and depression - Primary    Side effects with zoloft in the past. Will trial prozac at bedtime and monitor for benefit. Will try to avoid medicines that are not pregnancy safe as she is actively trying to conceive. Recommended she return to counseling      Relevant Medications   FLUoxetine (PROZAC) 20 MG capsule   traZODone (DESYREL) 50 MG tablet   Insomnia    Will trial prn trazodone,  discussed risks in pregnancy and to d/c if/when she becomes pregnant. Sleep hygiene reviewed. Hoping once moods are under better control her sleep will improve      Obesity (BMI 35.0-39.9 without comorbidity)    Discussed poor efficacy long term with weight loss medications and risks in pregnancy. Will work on diet and exercise. Dietician referral declined       Other Visit Diagnoses    Encounter to establish care       SOB (shortness of breath) on exertion       Workup has been consistently negative in the past. Pt declines further workup at this time. Suspect largely related to conditioning.       Follow up plan: Return in about 4 weeks (around 09/12/2018) for Mood, sleep f/u.

## 2018-08-21 ENCOUNTER — Ambulatory Visit (HOSPITAL_COMMUNITY): Payer: No Typology Code available for payment source

## 2018-08-21 ENCOUNTER — Telehealth (HOSPITAL_COMMUNITY): Payer: Self-pay

## 2018-08-21 NOTE — Telephone Encounter (Signed)
Not feeling well

## 2018-08-22 ENCOUNTER — Telehealth: Payer: No Typology Code available for payment source | Admitting: Family

## 2018-08-22 ENCOUNTER — Ambulatory Visit (HOSPITAL_COMMUNITY): Payer: No Typology Code available for payment source

## 2018-08-22 DIAGNOSIS — J00 Acute nasopharyngitis [common cold]: Secondary | ICD-10-CM | POA: Diagnosis not present

## 2018-08-22 DIAGNOSIS — R05 Cough: Secondary | ICD-10-CM | POA: Diagnosis not present

## 2018-08-22 DIAGNOSIS — R059 Cough, unspecified: Secondary | ICD-10-CM

## 2018-08-22 MED ORDER — BENZONATATE 100 MG PO CAPS: 100.0000 mg | ORAL_CAPSULE | Freq: Three times a day (TID) | ORAL | 0 refills | Status: DC | PRN

## 2018-08-22 MED ORDER — FLUTICASONE PROPIONATE 50 MCG/ACT NA SUSP: 1.0000 | Freq: Two times a day (BID) | NASAL | 6 refills | Status: DC

## 2018-08-22 NOTE — Progress Notes (Signed)
We are sorry you are not feeling well.  Here is how we plan to help!  Based on what you have shared with me, it looks like you may have a viral upper respiratory infection or a "common cold".  Colds are caused by a large number of viruses; however, rhinovirus is the most common cause.   Symptoms of the common cold vary from person to person, with common symptoms including sore throat, cough, and malaise.  A low-grade fever of 100.4 may present, but is often uncommon.  Symptoms vary however, and are closely related to a person's age or underlying illnesses.  The most common symptoms associated with the common cold are nasal discharge or congestion, cough, sneezing, headache and pressure in the ears and face.  Cold symptoms usually persist for about 3 to 10 days, but can last up to 2 weeks.  It is important to know that colds do not cause serious illness or complications in most cases.    The common cold is transmitted from person to person, with the most common method of transmission being a person's hands.  The virus is able to live on the skin and can infect other persons for up to 2 hours after direct contact.  Also, colds are transmitted when someone coughs or sneezes; thus, it is important to cover the mouth to reduce this risk.  To keep the spread of the common cold at [place], good hand hygiene is very important.  This is an infection that is most likely caused by a virus. There are no specific treatments for the common cold other than to help you with the symptoms until the infection runs its course.    For nasal congestion, you may use an oral decongestants such as Mucinex D or if you have glaucoma or high blood pressure use plain Mucinex.  Saline nasal spray or nasal drops can help and can safely be used as often as needed for congestion.  For your congestion, I have prescribed Fluticasone nasal spray one spray in each nostril twice a day  If you do not have a history of heart disease, hypertension,  diabetes or thyroid disease, prostate/bladder issues or glaucoma, you may also use Sudafed to treat nasal congestion.  It is highly recommended that you consult with a pharmacist or your primary care physician to ensure this medication is safe for you to take.     If you have a cough, you may use cough suppressants such as Delsym and Robitussin.  If you have glaucoma or high blood pressure, you can also use Coricidin HBP.   For cough I have prescribed for you A prescription cough medication called Tessalon Perles 100 mg. You may take 1-2 capsules every 8 hours as needed for cough  If you have a sore or scratchy throat, use a saltwater gargle-  to  teaspoon of salt dissolved in a 4-ounce to 8-ounce glass of warm water.  Gargle the solution for approximately 15-30 seconds and then spit.  It is important not to swallow the solution.  You can also use throat lozenges/cough drops and Chloraseptic spray to help with throat pain or discomfort.  Warm or cold liquids can also be helpful in relieving throat pain.  For headache, pain or general discomfort, you can use Ibuprofen or Tylenol as directed.   Some authorities believe that zinc sprays or the use of Echinacea may shorten the course of your symptoms.   HOME CARE . Only take medications as instructed by your  medical team. . Be sure to drink plenty of fluids. Water is fine as well as fruit juices, sodas and electrolyte beverages. You may want to stay away from caffeine or alcohol. If you are nauseated, try taking small sips of liquids. How do you know if you are getting enough fluid? Your urine should be a pale yellow or almost colorless. . Get rest. . Taking a steamy shower or using a humidifier may help nasal congestion and ease sore throat pain. You can place a towel over your head and breathe in the steam from hot water coming from a faucet. . Using a saline nasal spray works much the same way. . Cough drops, hard candies and sore throat lozenges  may ease your cough. . Avoid close contacts especially the very young and the elderly . Cover your mouth if you cough or sneeze . Always remember to wash your hands.   GET HELP RIGHT AWAY IF: . You develop worsening fever. . If your symptoms do not improve within 10 days . You develop yellow or green discharge from your nose over 3 days. . You have coughing fits . You develop a severe head ache or visual changes. . You develop shortness of breath, difficulty breathing or start having chest pain . Your symptoms persist after you have completed your treatment plan  MAKE SURE YOU   Understand these instructions.  Will watch your condition.  Will get help right away if you are not doing well or get worse.  Your e-visit answers were reviewed by a board certified advanced clinical practitioner to complete your personal care plan. Depending upon the condition, your plan could have included both over the counter or prescription medications. Please review your pharmacy choice. If there is a problem, you may call our nursing hot line at and have the prescription routed to another pharmacy. Your safety is important to [place]. If you have drug allergies check your prescription carefully.   You can use MyChart to ask questions about today's visit, request a non-urgent call back, or ask for a work or school excuse for 24 hours related to this e-Visit. If it has been greater than 24 hours you will need to follow up with your provider, or enter a new e-Visit to address those concerns. You will get an e-mail in the next two days asking about your experience.  I hope that your e-visit has been valuable and will speed your recovery. Thank you for using e-visits.

## 2018-08-23 DIAGNOSIS — G4700 Insomnia, unspecified: Secondary | ICD-10-CM | POA: Insufficient documentation

## 2018-08-23 DIAGNOSIS — E669 Obesity, unspecified: Secondary | ICD-10-CM | POA: Insufficient documentation

## 2018-08-23 NOTE — Assessment & Plan Note (Signed)
Side effects with zoloft in the past. Will trial prozac at bedtime and monitor for benefit. Will try to avoid medicines that are not pregnancy safe as she is actively trying to conceive. Recommended she return to counseling

## 2018-08-23 NOTE — Assessment & Plan Note (Signed)
Will trial prn trazodone, discussed risks in pregnancy and to d/c if/when she becomes pregnant. Sleep hygiene reviewed. Hoping once moods are under better control her sleep will improve

## 2018-08-23 NOTE — Assessment & Plan Note (Signed)
Discussed poor efficacy long term with weight loss medications and risks in pregnancy. Will work on diet and exercise. Dietician referral declined

## 2018-08-28 ENCOUNTER — Other Ambulatory Visit: Payer: Self-pay | Admitting: Obstetrics & Gynecology

## 2018-08-28 ENCOUNTER — Ambulatory Visit (HOSPITAL_COMMUNITY): Payer: No Typology Code available for payment source | Attending: Physical Therapy | Admitting: Physical Therapy

## 2018-09-05 ENCOUNTER — Ambulatory Visit: Payer: 59 | Admitting: Gastroenterology

## 2018-09-10 ENCOUNTER — Ambulatory Visit: Payer: Self-pay | Admitting: *Deleted

## 2018-09-10 NOTE — Telephone Encounter (Signed)
Message from Berneta Levins sent at 09/10/2018 4:24 PM EST   Summary: flu exposure   Pt states that her son was just diagnosed with Flu - type A. Pt wants to know if she can have a preventative dose of Tamiflu sent in for her. Pt uses [id number] - [place], [address] [phone] (Phone) [phone] (Fax)           Attempted to call pt x 2 but no answer at this time.Left message for pt to return call to office to discuss symptoms.

## 2018-09-10 NOTE — Telephone Encounter (Signed)
Pt calling to request a prescription for Tamiflu. Pt's son was diagnosed with Influenza A on today. Pt does not currently have symptoms. Pt would like for prescription to be sent to [facility] on [address].  Reason for Disposition . 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 TYPE of EXPOSURE: "How were you exposed?" (e.g., close contact, not a close contact)     son 2. DATE of EXPOSURE: "When did the exposure occur?" (e.g., hour, days, weeks)     Pt's son started having symptoms on Saturday, diagnosed with Influenza A on today 3. PREGNANCY: "Is there any chance you are pregnant?" "When was your last menstrual period?"     No chance of pregnandy 4. HIGH RISK for COMPLICATIONS: "Do you have any heart or lung problems? Do you have a weakened immune system?" (e.g., CHF, COPD, asthma, HIV positive, chemotherapy, renal failure, diabetes mellitus, sickle cell anemia)     No 5. SYMPTOMS: "Do you have any symptoms?" (e.g., cough, fever, sore throat, difficulty breathing).     No  Protocols used: INFLUENZA EXPOSURE-A-AH

## 2018-09-11 ENCOUNTER — Encounter: Payer: Self-pay | Admitting: Family Medicine

## 2018-09-11 ENCOUNTER — Other Ambulatory Visit: Payer: Self-pay | Admitting: Family Medicine

## 2018-09-11 MED ORDER — OSELTAMIVIR PHOSPHATE 75 MG PO CAPS: 75.0000 mg | ORAL_CAPSULE | Freq: Every day | ORAL | 0 refills | Status: DC

## 2018-09-11 NOTE — Telephone Encounter (Signed)
Please let her know that I called in the preventative dosing for her - once daily for 10 days, but if she becomes symptomatic she should increase frequency to twice daily until complete

## 2018-09-11 NOTE — Telephone Encounter (Signed)
Called and left patient's mother a VM asking for her to please return my call.

## 2018-09-11 NOTE — Telephone Encounter (Signed)
Left message on machine for pt to return call to the office.  

## 2018-09-12 NOTE — Telephone Encounter (Signed)
Patient sent in a my chart message stating that she pick up the RX.

## 2018-09-14 ENCOUNTER — Telehealth: Payer: No Typology Code available for payment source | Admitting: Family

## 2018-09-14 DIAGNOSIS — J309 Allergic rhinitis, unspecified: Secondary | ICD-10-CM

## 2018-09-14 NOTE — Progress Notes (Signed)
Greater than 5 minutes, yet less than 10 minutes of time have been spent researching, coordinating, and implementing care for this patient today.  Thank you for the details you included in the comment boxes. Those details are very helpful in determining the best course of treatment for you and help [place] to provide the best care.  E visit for Allergic Rhinitis We are sorry that you are not feeling well.  Here is how we plan to help!  Based on what you have shared with me it looks like you have Allergic Rhinitis.  Rhinitis is when a reaction occurs that causes nasal congestion, runny nose, sneezing, and itching.  Most types of rhinitis are caused by an inflammation and are associated with symptoms in the eyes ears or throat. There are several types of rhinitis.  The most common are acute rhinitis, which is usually caused by a viral illness, allergic or seasonal rhinitis, and nonallergic or year-round rhinitis.  Nasal allergies occur certain times of the year.  Allergic rhinitis is caused when allergens in the air trigger the release of histamine in the body.  Histamine causes itching, swelling, and fluid to build up in the fragile linings of the nasal passages, sinuses and eyelids.  An itchy nose and clear discharge are common.  I recommend the following over the counter treatments: Xyzal 5 mg take 1 tablet daily  I also would recommend a nasal spray: Flonase 2 sprays into each nostril once daily  You may also benefit from eye drops such as: Systane 1-2 driops each eye twice daily as needed  HOME CARE:   You can use an over-the-counter saline nasal spray as needed  Avoid areas where there is heavy dust, mites, or molds  Stay indoors on windy days during the pollen season  Keep windows closed in home, at least in bedroom; use air conditioner.  Use high-efficiency house air filter  Keep windows closed in car, turn AC on re-circulate  Avoid playing out with dog during pollen season  GET  HELP RIGHT AWAY IF:   If your symptoms do not improve within 10 days  You become short of breath  You develop yellow or green discharge from your nose for over 3 days  You have coughing fits  MAKE SURE YOU:   Understand these instructions  Will watch your condition  Will get help right away if you are not doing well or get worse  Thank you for choosing an e-visit. Your e-visit answers were reviewed by a board certified advanced clinical practitioner to complete your personal care plan. Depending upon the condition, your plan could have included both over the counter or prescription medications. Please review your pharmacy choice. Be sure that the pharmacy you have chosen is open so that you can pick up your prescription now.  If there is a problem you may message your provider in [place] to have the prescription routed to another pharmacy. Your safety is important to [place]. If you have drug allergies check your prescription carefully.  For the next 24 hours, you can use MyChart to ask questions about today's visit, request a non-urgent call back, or ask for a work or school excuse from your e-visit provider. You will get an email in the next two days asking about your experience. I hope that your e-visit has been valuable and will speed your recovery.

## 2018-09-18 ENCOUNTER — Ambulatory Visit: Payer: No Typology Code available for payment source | Admitting: Family Medicine

## 2018-09-19 ENCOUNTER — Other Ambulatory Visit: Payer: Self-pay

## 2018-09-19 ENCOUNTER — Encounter: Payer: Self-pay | Admitting: Family Medicine

## 2018-09-19 ENCOUNTER — Ambulatory Visit (INDEPENDENT_AMBULATORY_CARE_PROVIDER_SITE_OTHER): Payer: No Typology Code available for payment source | Admitting: Family Medicine

## 2018-09-19 VITALS — BP 114/79 | HR 85 | Temp 98.4°F | Ht 66.18 in | Wt 227.5 lb

## 2018-09-19 DIAGNOSIS — F329 Major depressive disorder, single episode, unspecified: Secondary | ICD-10-CM | POA: Diagnosis not present

## 2018-09-19 DIAGNOSIS — M25512 Pain in left shoulder: Secondary | ICD-10-CM

## 2018-09-19 DIAGNOSIS — F5104 Psychophysiologic insomnia: Secondary | ICD-10-CM

## 2018-09-19 DIAGNOSIS — F32A Depression, unspecified: Secondary | ICD-10-CM

## 2018-09-19 DIAGNOSIS — F419 Anxiety disorder, unspecified: Secondary | ICD-10-CM

## 2018-09-19 MED ORDER — QUETIAPINE FUMARATE 50 MG PO TABS: 25.0000 mg | ORAL_TABLET | Freq: Every day | ORAL | 2 refills | Status: DC

## 2018-09-19 MED ORDER — TRIAMCINOLONE ACETONIDE 40 MG/ML IJ SUSP
40.0000 mg | Freq: Once | INTRAMUSCULAR | Status: AC
  Administered 2018-09-19: 40 mg via INTRAMUSCULAR

## 2018-09-19 NOTE — Assessment & Plan Note (Signed)
Will trial seroquel for anxiety and sleep. Recommended counseling. She will consider

## 2018-09-19 NOTE — Progress Notes (Signed)
BP 114/79 (BP Location: Right Arm, Patient Position: Sitting, Cuff Size: Normal)   Pulse 85   Temp 98.4 F (36.9 C) (Oral)   Ht 5' 6.18" (1.681 m)   Wt 227 lb 8 oz (103.2 kg)   SpO2 99%   BMI 36.52 kg/m    Subjective:    Patient ID: Emily Morales, female    DOB: [birth year], 28 y.o.   MRN: [id number]  HPI: [name] is a 28 y.o. female  Chief Complaint  Patient presents with  . Follow-up    mood slightly better and sleep still about the same as last visit  . Shoulder Pain    started yesterday took muscle relaxant and rubbing tiger balm and biofreeze and it doesn't help. Pt stated pain goes up to neck where she can't turn her neck a certain angle   Here today for mood and sleep f/u. Decided she did not want to take the prozac, wanting to try managing moods on her own for now. Tried the trazodone half tab the first night and full tab second night with no relief either time. Has failed ambien, unisom, melatonin, zzquil.   Left shoulder pain since yesterday when she woke up from sleep. The pain starts in her left trapezius muscle and seems to hurt only when she turns her head or lifts her arm. Has tried an old muscle relaxer and ibuprofen with minimal relief. Denies numbness, tingling down into hand, known injury, or hx of neck issues.   Depression screen PHQ 2/9 08/15/2018  Decreased Interest 2  Down, Depressed, Hopeless 2  PHQ - 2 Score 4  Altered sleeping 3  Tired, decreased energy 1  Change in appetite 3  Feeling bad or failure about yourself  1  Trouble concentrating 2  Moving slowly or fidgety/restless 2  Suicidal thoughts 0  PHQ-9 Score 16    Relevant past medical, surgical, family and social history reviewed and updated as indicated. Interim medical history since our last visit reviewed. Allergies and medications reviewed and updated.  Review of Systems  Per HPI unless specifically indicated above     Objective:    BP 114/79 (BP Location:  Right Arm, Patient Position: Sitting, Cuff Size: Normal)   Pulse 85   Temp 98.4 F (36.9 C) (Oral)   Ht 5' 6.18" (1.681 m)   Wt 227 lb 8 oz (103.2 kg)   SpO2 99%   BMI 36.52 kg/m   Wt Readings from Last 3 Encounters:  09/19/18 227 lb 8 oz (103.2 kg)  08/15/18 225 lb (102.1 kg)  08/07/18 225 lb (102.1 kg)    Physical Exam Vitals signs and nursing note reviewed.  Constitutional:      Appearance: Normal appearance. She is not ill-appearing.  HENT:     Head: Atraumatic.  Eyes:     Extraocular Movements: Extraocular movements intact.     Conjunctiva/sclera: Conjunctivae normal.  Neck:     Musculoskeletal: Normal range of motion and neck supple.  Cardiovascular:     Rate and Rhythm: Normal rate and regular rhythm.     Heart sounds: Normal heart sounds.  Pulmonary:     Effort: Pulmonary effort is normal.     Breath sounds: Normal breath sounds.  Musculoskeletal:        General: Tenderness (ttp left trapezius and down into scapula) present. No swelling or deformity.     Comments: ROM intact b/l UEs but limited overhead motion left arm due to pain   Skin:  General: Skin is warm and dry.  Neurological:     Mental Status: She is alert and oriented to person, place, and time.  Psychiatric:        Mood and Affect: Mood normal.        Thought Content: Thought content normal.        Judgment: Judgment normal.     Results for orders placed or performed in visit on 07/02/18  Cytology - PAP( [facility])  Result Value Ref Range   Adequacy      Satisfactory for evaluation  endocervical/transformation zone component PRESENT.   Diagnosis      NEGATIVE FOR INTRAEPITHELIAL LESIONS OR MALIGNANCY.   Chlamydia Negative    Neisseria gonorrhea Negative    Material Submitted CervicoVaginal Pap [ThinPrep Imaged]       Assessment & Plan:   Problem List Items Addressed This Visit      Other   Anxiety and depression    Will trial seroquel for anxiety and sleep. Recommended  counseling. She will consider      Insomnia    No benefit with trazodone. Trial seroquel, continued work on sleep hygiene       Other Visit Diagnoses    Acute pain of left shoulder    -  Primary   IM kenalog given today, continue muscle relaxer, NSAIDs, heat, massage, stretches. F/u if not improving   Relevant Medications   triamcinolone acetonide (KENALOG-40) injection 40 mg (Completed)       Follow up plan: Return in about 4 weeks (around 10/17/2018) for sleep f/u.

## 2018-09-19 NOTE — Assessment & Plan Note (Signed)
No benefit with trazodone. Trial seroquel, continued work on sleep hygiene

## 2018-09-29 ENCOUNTER — Telehealth: Payer: No Typology Code available for payment source | Admitting: Family

## 2018-09-29 DIAGNOSIS — J069 Acute upper respiratory infection, unspecified: Secondary | ICD-10-CM

## 2018-09-29 MED ORDER — FLUTICASONE PROPIONATE 50 MCG/ACT NA SUSP: 2.0000 | Freq: Every day | NASAL | 0 refills | Status: DC

## 2018-09-29 MED ORDER — BENZONATATE 100 MG PO CAPS: 100.0000 mg | ORAL_CAPSULE | Freq: Two times a day (BID) | ORAL | 0 refills | Status: DC | PRN

## 2018-09-29 NOTE — Progress Notes (Signed)

## 2018-10-03 ENCOUNTER — Other Ambulatory Visit: Payer: Self-pay | Admitting: Gastroenterology

## 2018-10-03 ENCOUNTER — Encounter: Payer: Self-pay | Admitting: Family Medicine

## 2018-10-03 MED ORDER — LINACLOTIDE 290 MCG PO CAPS: 290.0000 ug | ORAL_CAPSULE | Freq: Every day | ORAL | 1 refills | Status: DC

## 2018-10-03 NOTE — Progress Notes (Signed)
.  li

## 2018-10-16 ENCOUNTER — Encounter: Payer: Self-pay | Admitting: Family Medicine

## 2018-10-17 ENCOUNTER — Encounter: Payer: Self-pay | Admitting: Family Medicine

## 2018-10-18 ENCOUNTER — Other Ambulatory Visit: Payer: Self-pay | Admitting: Family Medicine

## 2018-10-18 ENCOUNTER — Ambulatory Visit: Payer: Self-pay | Admitting: Family Medicine

## 2018-10-18 ENCOUNTER — Telehealth: Payer: No Typology Code available for payment source | Admitting: Family

## 2018-10-18 DIAGNOSIS — B9689 Other specified bacterial agents as the cause of diseases classified elsewhere: Secondary | ICD-10-CM

## 2018-10-18 DIAGNOSIS — J208 Acute bronchitis due to other specified organisms: Secondary | ICD-10-CM

## 2018-10-18 MED ORDER — ALBUTEROL SULFATE 108 (90 BASE) MCG/ACT IN AEPB: 2.0000 | INHALATION_SPRAY | RESPIRATORY_TRACT | 2 refills | Status: DC | PRN

## 2018-10-18 MED ORDER — QUETIAPINE FUMARATE 50 MG PO TABS: 25.0000 mg | ORAL_TABLET | Freq: Every day | ORAL | 2 refills | Status: DC

## 2018-10-18 MED ORDER — AZITHROMYCIN 250 MG PO TABS: ORAL_TABLET | ORAL | 0 refills | Status: DC

## 2018-10-18 MED ORDER — BENZONATATE 100 MG PO CAPS: 100.0000 mg | ORAL_CAPSULE | Freq: Two times a day (BID) | ORAL | 0 refills | Status: DC | PRN

## 2018-10-18 NOTE — Progress Notes (Signed)
Greater than 5 minutes, yet less than 10 minutes of time have been spent researching, coordinating, and implementing care for this patient today.  Thank you for the details you included in the comment boxes. Those details are very helpful in determining the best course of treatment for you and help [place] to provide the best care.  With the productive cough 14-21 days ago, and persisting, this is more in line with a bacterial bronchitis.   We are sorry that you are not feeling well.  Here is how we plan to help!  Based on your presentation I believe you most likely have A cough due to bacteria.  When patients have a fever and a productive cough with a change in color or increased sputum production, we are concerned about bacterial bronchitis.  If left untreated it can progress to pneumonia.  If your symptoms do not improve with your treatment plan it is important that you contact your provider.   I have prescribed Azithromyin 250 mg: two tablets now and then one tablet daily for 4 additonal days    In addition you may use A non-prescription cough medication called Mucinex DM: take 2 tablets every 12 hours. and A prescription cough medication called Tessalon Perles 100mg . You may take 1-2 capsules every 8 hours as needed for your cough.  I have also added an Albuterol inhaler, take 2 puffs every 6 hours as needed for shortness of breath.    From your responses in the eVisit questionnaire you describe inflammation in the upper respiratory tract which is causing a significant cough.  This is commonly called Bronchitis and has four common causes:    Allergies  Viral Infections  Acid Reflux  Bacterial Infection Allergies, viruses and acid reflux are treated by controlling symptoms or eliminating the cause. An example might be a cough caused by taking certain blood pressure medications. You stop the cough by changing the medication. Another example might be a cough caused by acid reflux. Controlling  the reflux helps control the cough.  USE OF BRONCHODILATOR ("RESCUE") INHALERS: There is a risk from using your bronchodilator too frequently.  The risk is that over-reliance on a medication which only relaxes the muscles surrounding the breathing tubes can reduce the effectiveness of medications prescribed to reduce swelling and congestion of the tubes themselves.  Although you feel brief relief from the bronchodilator inhaler, your asthma may actually be worsening with the tubes becoming more swollen and filled with mucus.  This can delay other crucial treatments, such as oral steroid medications. If you need to use a bronchodilator inhaler daily, several times per day, you should discuss this with your provider.  There are probably better treatments that could be used to keep your asthma under control.     HOME CARE . Only take medications as instructed by your medical team. . Complete the entire course of an antibiotic. . Drink plenty of fluids and get plenty of rest. . Avoid close contacts especially the very young and the elderly . Cover your mouth if you cough or cough into your sleeve. . Always remember to wash your hands . A steam or ultrasonic humidifier can help congestion.   GET HELP RIGHT AWAY IF: . You develop worsening fever. . You become short of breath . You cough up blood. . Your symptoms persist after you have completed your treatment plan MAKE SURE YOU   Understand these instructions.  Will watch your condition.  Will get help right away if you  are not doing well or get worse.  Your e-visit answers were reviewed by a board certified advanced clinical practitioner to complete your personal care plan.  Depending on the condition, your plan could have included both over the counter or prescription medications. If there is a problem please reply  once you have received a response from your provider. Your safety is important to [place].  If you have drug allergies check your  prescription carefully.    You can use MyChart to ask questions about today's visit, request a non-urgent call back, or ask for a work or school excuse for 24 hours related to this e-Visit. If it has been greater than 24 hours you will need to follow up with your provider, or enter a new e-Visit to address those concerns. You will get an e-mail in the next two days asking about your experience.  I hope that your e-visit has been valuable and will speed your recovery. Thank you for using e-visits.

## 2018-10-19 ENCOUNTER — Telehealth: Payer: No Typology Code available for payment source | Admitting: Physician Assistant

## 2018-10-19 NOTE — Progress Notes (Signed)
We do not do e-visits for children. You should not submit an e-visit questionnaire for someone other than yourself regardless as we make decisions not only based on questionnaire but on review of your medical record. You will need to call your child's pediatrician for further instruction.  There will be no charge for the e-visit.

## 2018-10-22 ENCOUNTER — Telehealth: Payer: No Typology Code available for payment source | Admitting: Physician Assistant

## 2018-10-22 ENCOUNTER — Encounter: Payer: Self-pay | Admitting: Physician Assistant

## 2018-10-22 ENCOUNTER — Other Ambulatory Visit: Payer: Self-pay | Admitting: Gastroenterology

## 2018-10-22 DIAGNOSIS — J309 Allergic rhinitis, unspecified: Secondary | ICD-10-CM | POA: Diagnosis not present

## 2018-10-22 MED ORDER — CETIRIZINE HCL 10 MG PO TABS: 10.0000 mg | ORAL_TABLET | Freq: Every day | ORAL | 0 refills | Status: DC

## 2018-10-22 MED ORDER — FEXOFENADINE HCL 180 MG PO TABS: 180.0000 mg | ORAL_TABLET | Freq: Every day | ORAL | 0 refills | Status: DC

## 2018-10-22 MED ORDER — LINACLOTIDE 290 MCG PO CAPS: 290.0000 ug | ORAL_CAPSULE | Freq: Every day | ORAL | 1 refills | Status: DC

## 2018-10-22 MED ORDER — NAPHAZOLINE-PHENIRAMINE 0.025-0.3 % OP SOLN: 1.0000 [drp] | Freq: Four times a day (QID) | OPHTHALMIC | 0 refills | Status: DC | PRN

## 2018-10-22 MED ORDER — FLUTICASONE PROPIONATE 50 MCG/ACT NA SUSP: 2.0000 | Freq: Every day | NASAL | 0 refills | Status: DC

## 2018-10-22 NOTE — Progress Notes (Signed)
E visit for Allergic Rhinitis We are sorry that you are not feeling well.  Here is how we plan to help!  Based on what you have shared with me it looks like you have Allergic Rhinitis.  Rhinitis is when a reaction occurs that causes nasal congestion, runny nose, sneezing, and itching.  Most types of rhinitis are caused by an inflammation and are associated with symptoms in the eyes ears or throat. There are several types of rhinitis.  The most common are acute rhinitis, which is usually caused by a viral illness, allergic or seasonal rhinitis, and nonallergic or year-round rhinitis.  Nasal allergies occur certain times of the year.  Allergic rhinitis is caused when allergens in the air trigger the release of histamine in the body.  Histamine causes itching, swelling, and fluid to build up in the fragile linings of the nasal passages, sinuses and eyelids.  An itchy nose and clear discharge are common.  I recom mend the following over the counter treatments: Continue taking Cetirizne 10 mg, I have provided another prescription.   I also would recommend a nasal spray: Flonase 2 sprays into each nostril once daily  You may also benefit from eye drops such as: Visine 1-2 drops each eye twice daily as needed  HOME CARE:   You can use an over-the-counter saline nasal spray as needed  Avoid areas where there is heavy dust, mites, or molds  Stay indoors on windy days during the pollen season  Keep windows closed in home, at least in bedroom; use air conditioner.  Use high-efficiency house air filter  Keep windows closed in car, turn AC on re-circulate  Avoid playing out with dog during pollen season  GET HELP RIGHT AWAY IF:   If your symptoms do not improve within 10 days  You become short of breath  You develop yellow or green discharge from your nose for over 3 days  You have coughing fits  MAKE SURE YOU:   Understand these instructions  Will watch your condition  Will get  help right away if you are not doing well or get worse  Thank you for choosing an e-visit. Your e-visit answers were reviewed by a board certified advanced clinical practitioner to complete your personal care plan. Depending upon the condition, your plan could have included both over the counter or prescription medications. Please review your pharmacy choice. Be sure that the pharmacy you have chosen is open so that you can pick up your prescription now.  If there is a problem you may message your provider in [place] to have the prescription routed to another pharmacy. Your safety is important to [place]. If you have drug allergies check your prescription carefully.  For the next 24 hours, you can use MyChart to ask questions about today's visit, request a non-urgent call back, or ask for a work or school excuse from your e-visit provider. You will get an email in the next two days asking about your experience. I hope that your e-visit has been valuable and will speed your recovery.        I have spent 7 min in completion and review of this note- [facility]

## 2018-10-22 NOTE — Addendum Note (Signed)
Addended by: Dutch Quint B on: 10/22/2018 02:25 PM   Modules accepted: Orders

## 2018-10-27 ENCOUNTER — Encounter: Payer: Self-pay | Admitting: Family Medicine

## 2018-10-30 ENCOUNTER — Encounter: Payer: Self-pay | Admitting: Family Medicine

## 2018-10-30 ENCOUNTER — Other Ambulatory Visit: Payer: Self-pay

## 2018-10-30 ENCOUNTER — Ambulatory Visit (INDEPENDENT_AMBULATORY_CARE_PROVIDER_SITE_OTHER): Payer: No Typology Code available for payment source | Admitting: Family Medicine

## 2018-10-30 VITALS — BP 140/65 | Ht 66.0 in | Wt 216.0 lb

## 2018-10-30 DIAGNOSIS — G5602 Carpal tunnel syndrome, left upper limb: Secondary | ICD-10-CM | POA: Diagnosis not present

## 2018-10-30 MED ORDER — PREDNISONE 10 MG PO TABS: ORAL_TABLET | ORAL | 0 refills | Status: DC

## 2018-10-30 NOTE — Progress Notes (Signed)
   BP 140/65   Ht 5\' 6"  (1.676 m)   Wt 216 lb (98 kg)   BMI 34.86 kg/m    Subjective:    Patient ID: Emily Morales, female    DOB: [birth year], 28 y.o.   MRN: [id number]  HPI: Emily Morales is a 28 y.o. female  Chief Complaint  Patient presents with  . Hand Pain    Started on Sunday. Was locking. Now having shooting pain. Left hand, middle finger.     . This visit was completed via telephone due to the restrictions of the COVID-19 pandemic. All issues as above were discussed and addressed but no physical exam was performed. If it was felt that the patient should be evaluated in the office, they were directed there. The patient verbally consented to this visit. Patient was unable to complete an audio/visual visit due to Technical difficulties,Lack of internet. Due to the catastrophic nature of the COVID-19 pandemic, this visit was done through audio contact only. . Location of the patient: home . Location of the provider: home . Those involved with this call:  . Provider: Merrie Roof, PA-C . CMA: Gerda Diss, CMA . Front Desk/Registration: Jill Side  . Time spent on call: 15 minutes on the phone discussing health concerns  Following up today with concerns about left middle finger pain and mild swelling and stiffness the past 3 days. No injury, bruising, redness, warmth. Does also note some numbness and tingling going down hand from wrist, Has not been trying anything OTC for sxs.   Relevant past medical, surgical, family and social history reviewed and updated as indicated. Interim medical history since our last visit reviewed. Allergies and medications reviewed and updated.  Review of Systems  Per HPI unless specifically indicated above     Objective:    BP 140/65   Ht 5\' 6"  (1.676 m)   Wt 216 lb (98 kg)   BMI 34.86 kg/m   Wt Readings from Last 3 Encounters:  10/30/18 216 lb (98 kg)  09/19/18 227 lb 8 oz (103.2 kg)  08/15/18 225 lb (102.1 kg)     Physical Exam  Results for orders placed or performed in visit on 07/02/18  Cytology - PAP( [place])  Result Value Ref Range   Adequacy      Satisfactory for evaluation  endocervical/transformation zone component PRESENT.   Diagnosis      NEGATIVE FOR INTRAEPITHELIAL LESIONS OR MALIGNANCY.   Chlamydia Negative    Neisseria gonorrhea Negative    Material Submitted CervicoVaginal Pap [ThinPrep Imaged]       Assessment & Plan:   Problem List Items Addressed This Visit    None    Visit Diagnoses    Carpal tunnel syndrome of left wrist    -  Primary   Based on history and pt reported exam findings, suspect CTS causing sxs. Tx with prednisone, braces, massage, rest. F/u if not improving       Follow up plan: Return if symptoms worsen or fail to improve.

## 2018-10-31 ENCOUNTER — Encounter: Payer: Self-pay | Admitting: Family Medicine

## 2018-11-12 ENCOUNTER — Other Ambulatory Visit: Payer: Self-pay

## 2018-11-12 ENCOUNTER — Encounter: Payer: Self-pay | Admitting: Gastroenterology

## 2018-11-12 ENCOUNTER — Encounter: Payer: Self-pay | Admitting: Family Medicine

## 2018-11-12 ENCOUNTER — Encounter

## 2018-11-12 ENCOUNTER — Ambulatory Visit (INDEPENDENT_AMBULATORY_CARE_PROVIDER_SITE_OTHER): Payer: No Typology Code available for payment source | Admitting: Gastroenterology

## 2018-11-12 DIAGNOSIS — K5900 Constipation, unspecified: Secondary | ICD-10-CM | POA: Diagnosis not present

## 2018-11-12 DIAGNOSIS — K219 Gastro-esophageal reflux disease without esophagitis: Secondary | ICD-10-CM | POA: Diagnosis not present

## 2018-11-12 MED ORDER — OMEPRAZOLE 40 MG PO CPDR: 40.0000 mg | DELAYED_RELEASE_CAPSULE | Freq: Every day | ORAL | 0 refills | Status: DC

## 2018-11-12 MED ORDER — PLECANATIDE 3 MG PO TABS: 3.0000 mg | ORAL_TABLET | Freq: Every day | ORAL | 0 refills | Status: DC

## 2018-11-12 NOTE — Patient Instructions (Signed)
1. Stop Linzess.  Start Trulance 3 mg daily for constipation.  Prescription [facility] to [facility] on [address].  Call in a couple weeks if you feel like this medication is not working adequately for you. 2. Start omeprazole 40 mg daily 30 minutes before lunch.  Call in a couple weeks if your reflux symptoms or not improved.  [facility]. 3. We will see you back in 1 year or sooner if needed.

## 2018-11-12 NOTE — Progress Notes (Addendum)
Primary Care Physician:  Volney American, PA-C Primary GI:  Barney Drain, MD   Patient Location: Home  Provider Location: [facility] office  Reason for Visit: f/u GERD/constipation  Persons present on the virtual encounter, with roles: Patient, myself (provider),Mindy Quincy Simmonds CMA(updated meds and allergies)  Total time (minutes) spent on medical discussion: 20 minutes  Due to COVID-19, visit was conducted using Facetime method.  Visit was requested by patient.  Virtual Visit via Facetime  I connected with Emily Morales on 11/12/18 at  8:00 AM EDT by Facetime and verified that I am speaking with the correct person using two identifiers.   I discussed the limitations, risks, security and privacy concerns of performing an evaluation and management service by telephone/video and the availability of in person appointments. I also discussed with the patient that there may be a patient responsible charge related to this service. The patient expressed understanding and agreed to proceed.   HPI:   Emily Morales is a 28 y.o. female who presents for virtual visit regarding GERD and constipation.  She was last seen in October 2018.  EGD in April 2018 showed mild gastritis, normal esophagus, biopsy showed inflammation of the small bowel, gastric biopsies with no H. pylori.  Previously did well on Linzess 290 mcg daily.  Linzess was working great but no longer helping. Having abdominal pain in upper abdomen/bloating.  Worse with constipation, relieved with bowel movement.  Having a bowel movement about 3-4 times per month.  No melena or rectal bleeding.  No N/V. Appetite ok. PPI didn't help much. Tons of heartburn.  Causes discomfort up in her chest.  EKGs normal.  Saw Dr. Benjamine Mola who "ran a scope" and put her on ranitidine.  She had to stop it with the recall.  Patient has been on Protonix and Carafate which did not help either.  Reflux often wakes her up at night.  Tends to be worse  in the afternoon as well.   Current Outpatient Medications  Medication Sig Dispense Refill  . Albuterol Sulfate (PROAIR RESPICLICK) 277 (90 Base) MCG/ACT AEPB Inhale 2 puffs into the lungs every 4 (four) hours as needed (shortness of breath). 1 each 2  . benzonatate (TESSALON) 100 MG capsule Take 1 capsule (100 mg total) by mouth 2 (two) times daily as needed for cough. 20 capsule 0  . fexofenadine (ALLEGRA ALLERGY) 180 MG tablet Take 1 tablet (180 mg total) by mouth daily. 30 tablet 0  . fluticasone (FLONASE) 50 MCG/ACT nasal spray Place 2 sprays into both nostrils daily. 16 g 0  . linaclotide (LINZESS) 290 MCG CAPS capsule Take 1 capsule (290 mcg total) by mouth daily before breakfast. 30 capsule 1  . naphazoline-pheniramine (NAPHCON-A) 0.025-0.3 % ophthalmic solution Place 1 drop into both eyes 4 (four) times daily as needed for eye irritation. 15 mL 0  . QUEtiapine (SEROQUEL) 50 MG tablet Take 0.5-1 tablets (25-50 mg total) by mouth at bedtime. 30 tablet 2   No current facility-administered medications for this visit.     Past Medical History:  Diagnosis Date  . Abnormal Pap smear   . Anemia   . Anxiety   . Arthritis   . Chronic back pain   . Depression   . Dysrhythmia    irregular heartrate  . GERD (gastroesophageal reflux disease)   . Headache(784.0)   . Hx of chlamydia infection   . Hypothyroidism   . Ovarian cyst, right   . PCOS (polycystic ovarian  syndrome)   . Perimenopausal   . Uterine fibroid     Past Surgical History:  Procedure Laterality Date  . BIOPSY  11/14/2016   Procedure: BIOPSY;  Surgeon: Danie Binder, MD;  Location: [facility] SUITE;  Service: Endoscopy;;  duodenal gastric  . COLPOSCOPY    . ESOPHAGOGASTRODUODENOSCOPY (EGD) WITH PROPOFOL N/A 11/14/2016   Dr. Oneida Alar: Mild gastritis, normal esophagus, biopsy showed inflammation of the small bowel, gastric biopsies with no H. pylori.  . lipoma surgery    . MASS EXCISION N/A 02/28/2017   Procedure: EXCISION  LIPOMA SCALP 3CM;  Surgeon: Aviva Signs, MD;  Location: [facility] ORS;  Service: General;  Laterality: N/A;    Family History  Problem Relation Age of Onset  . Diabetes Father   . Hypertension Father   . Other Son        reactive airway disease  . Asthma Son   . Heart murmur Son   . Cancer Maternal Grandmother        lung  . Lung cancer Maternal Grandmother   . Heart disease Paternal Grandmother   . Hypertension Paternal Grandmother   . Liver disease Paternal Grandfather        etoh related  . Colon cancer Neg Hx     Social History   Socioeconomic History  . Marital status: Married    Spouse name: Not on file  . Number of children: Not on file  . Years of education: Not on file  . Highest education level: Not on file  Occupational History  . Occupation: Theatre stage manager Needs  . Financial resource strain: Not on file  . Food insecurity:    Worry: Not on file    Inability: Not on file  . Transportation needs:    Medical: Not on file    Non-medical: Not on file  Tobacco Use  . Smoking status: Former Smoker    Types: Cigarettes  . Smokeless tobacco: Never Used  Substance and Sexual Activity  . Alcohol use: Yes    Comment: occ  . Drug use: Not Currently    Comment: rarely  . Sexual activity: Yes    Partners: Male    Birth control/protection: None  Lifestyle  . Physical activity:    Days per week: Not on file    Minutes per session: Not on file  . Stress: Not on file  Relationships  . Social connections:    Talks on phone: Not on file    Gets together: Not on file    Attends religious service: Not on file    Active member of club or organization: Not on file    Attends meetings of clubs or organizations: Not on file    Relationship status: Not on file  . Intimate partner violence:    Fear of current or ex partner: Not on file    Emotionally abused: Not on file    Physically abused: Not on file    Forced sexual activity: Not on file  Other Topics Concern  .  Not on file  Social History Narrative  . Not on file      ROS:  General: Negative for anorexia, weight loss, fever, chills, fatigue, weakness. Eyes: Negative for vision changes.  ENT: Negative for hoarseness, difficulty swallowing , nasal congestion. CV: Negative for chest pain, angina, palpitations, dyspnea on exertion, peripheral edema.  Respiratory: Negative for dyspnea at rest, dyspnea on exertion, cough, sputum, wheezing.  GI: See history of present illness. GU:  Negative for dysuria, hematuria, urinary incontinence, urinary frequency, nocturnal urination.  MS: Negative for joint pain, low back pain.  Derm: Negative for rash or itching.  Neuro: Negative for weakness, abnormal sensation, seizure, frequent headaches, memory loss, confusion.  Psych: Negative for anxiety, depression, suicidal ideation, hallucinations.  Endo: Negative for unusual weight change.  Heme: Negative for bruising or bleeding. Allergy: Negative for rash or hives.   Observations/Objective: Appears well.  No acute distress.  Well-nourished.  She points to her upper abdomen is location of her pain.  Otherwise exam not available.  Assessment and Plan: Pleasant 28 year old female with history of GERD, constipation.  Symptoms have been poorly controlled.  No longer on PPI.  Linzess 290 mcg daily and having a bowel movement 3-4 times per month.  Add omeprazole 40 mg 30 minutes before lunch as her symptoms are mostly afternoon evening.  Stop Linzess.  Start Trulance 3 mg daily.  [facility]  After COVID-19 crisis with she will need additional prescription sent to [facility] outpatient pharmacy.  We will wait to see if this medication regimen works for her first.  She will call in a couple weeks if she is not feeling better.  Otherwise we will see her back in a year.  Follow Up Instructions:    I discussed the assessment and treatment plan with the patient. The patient was provided an  opportunity to ask questions and all were answered. The patient agreed with the plan and demonstrated an understanding of the instructions. AVS mailed to patient's home address.   The patient was advised to call back or seek an in-person evaluation if the symptoms worsen or if the condition fails to improve as anticipated.  I provided 20 minutes of virtual face-to-face time during this encounter.   Neil Crouch, PA-C

## 2018-11-14 ENCOUNTER — Other Ambulatory Visit: Payer: Self-pay | Admitting: Family Medicine

## 2018-11-14 MED ORDER — QUETIAPINE FUMARATE 50 MG PO TABS: 25.0000 mg | ORAL_TABLET | Freq: Every day | ORAL | 0 refills | Status: DC

## 2018-11-18 ENCOUNTER — Encounter: Payer: Self-pay | Admitting: Family Medicine

## 2018-11-19 ENCOUNTER — Other Ambulatory Visit: Payer: Self-pay | Admitting: Family Medicine

## 2018-11-19 MED ORDER — QUETIAPINE FUMARATE 100 MG PO TABS: 100.0000 mg | ORAL_TABLET | Freq: Every day | ORAL | 0 refills | Status: DC

## 2018-12-02 ENCOUNTER — Other Ambulatory Visit: Payer: Self-pay | Admitting: Gastroenterology

## 2018-12-02 MED ORDER — HYDROCORTISONE ACETATE 25 MG RE SUPP: 25.0000 mg | Freq: Two times a day (BID) | RECTAL | 0 refills | Status: DC

## 2018-12-07 NOTE — Progress Notes (Signed)
Greater than 5 minutes, yet less than 10 minutes of time have been spent researching, coordinating, and implementing care for this patient today.  Thank you for the details you included in the comment boxes. Those details are very helpful in determining the best course of treatment for you and help us to provide the best care.  

## 2018-12-12 ENCOUNTER — Encounter: Payer: Self-pay | Admitting: Family Medicine

## 2018-12-12 ENCOUNTER — Telehealth: Payer: Self-pay | Admitting: Family Medicine

## 2018-12-12 NOTE — Telephone Encounter (Signed)
Scheduled virtual for tomorrow  

## 2018-12-12 NOTE — Telephone Encounter (Signed)
Please get her scheduled for OV to discuss

## 2018-12-13 ENCOUNTER — Telehealth (INDEPENDENT_AMBULATORY_CARE_PROVIDER_SITE_OTHER): Payer: No Typology Code available for payment source | Admitting: Family Medicine

## 2018-12-13 ENCOUNTER — Encounter: Payer: Self-pay | Admitting: Family Medicine

## 2018-12-13 ENCOUNTER — Other Ambulatory Visit: Payer: Self-pay

## 2018-12-13 VITALS — BP 135/75 | HR 95 | Temp 98.6°F

## 2018-12-13 DIAGNOSIS — F329 Major depressive disorder, single episode, unspecified: Secondary | ICD-10-CM

## 2018-12-13 DIAGNOSIS — F419 Anxiety disorder, unspecified: Secondary | ICD-10-CM

## 2018-12-13 DIAGNOSIS — F5104 Psychophysiologic insomnia: Secondary | ICD-10-CM | POA: Diagnosis not present

## 2018-12-13 DIAGNOSIS — F32A Depression, unspecified: Secondary | ICD-10-CM

## 2018-12-13 MED ORDER — VORTIOXETINE HBR 5 MG PO TABS: 5.0000 mg | ORAL_TABLET | Freq: Every day | ORAL | 0 refills | Status: DC

## 2018-12-13 NOTE — Assessment & Plan Note (Signed)
Continue seroquel, will trial trintellix and refer to Psychiatry for consultation given multiple medication failures in the past and no improvement with addition of counseling. F/u in 1 month if not already into psychiatry at that point.

## 2018-12-13 NOTE — Progress Notes (Signed)
BP 135/75   Pulse 95   Temp 98.6 F (37 C)    Subjective:    Patient ID: Emily Morales, female    DOB: 02/27/1991, 28 y.o.   MRN: [id number]  HPI: Emily Morales is a 28 y.o. female  Chief Complaint  Patient presents with  . Anxiety    . This visit was completed via telephone due to the restrictions of the COVID-19 pandemic. All issues as above were discussed and addressed but no physical exam was performed. If it was felt that the patient should be evaluated in the office, they were directed there. The patient verbally consented to this visit. Patient was unable to complete an audio/visual visit due to Technical difficulties,Lack of internet. Due to the catastrophic nature of the COVID-19 pandemic, this visit was done through audio contact only. . Location of the patient: home . Location of the provider: home . Those involved with this call:  . Provider: Merrie Roof, PA-C . CMA: Tiffany Reel, CMA . Front Desk/Registration: Jill Side  . Time spent on call: 20 minutes on the phone discussing health concerns. 5 minutes total spent in review of patient's record and preparation of their chart. I verified patient identity using two factors (patient name and date of birth). Patient consents verbally to being seen via telemedicine visit today.   Patient following up today due to worsening anxiety with more frequent panic episodes. Most recent one was two days ago and she states she had to fall to the floor and collect herself for at least 20 minutes. Denies daily issues with panic episodes, but does feel anxious daily. Some intermittent issues with depressed moods as well but no SI/HI. Has tried many medications in the past all with no relief. The only thing that benefited her slightly was effexor, but that caused libido issues. Has failed lexapro, buspirone, wellbutrin, hydroxyzine, and trazodone otherwise. Currently takes seroquel for insomnia which does help some with  that. Has tried several times going to counseling at [facility] with minimal benefit.   Depression screen [facility] 2/9 12/13/2018 08/15/2018  Decreased Interest 3 2  Down, Depressed, Hopeless 1 2  PHQ - 2 Score 4 4  Altered sleeping 0 3  Tired, decreased energy 1 1  Change in appetite 3 3  Feeling bad or failure about yourself  0 1  Trouble concentrating 3 2  Moving slowly or fidgety/restless 0 2  Suicidal thoughts 0 0  PHQ-9 Score 11 16  Difficult doing work/chores Somewhat difficult -   GAD 7 : Generalized Anxiety Score 12/13/2018 08/15/2018  Nervous, Anxious, on Edge 0 3  Control/stop worrying 3 1  Worry too much - different things 3 2  Trouble relaxing 2 3  Restless 0 1  Easily annoyed or irritable 3 3  Afraid - awful might happen 1 2  Total GAD 7 Score 12 15  Anxiety Difficulty Very difficult -   Relevant past medical, surgical, family and social history reviewed and updated as indicated. Interim medical history since our last visit reviewed. Allergies and medications reviewed and updated.  Review of Systems  Per HPI unless specifically indicated above     Objective:    BP 135/75   Pulse 95   Temp 98.6 F (37 C)   Wt Readings from Last 3 Encounters:  10/30/18 216 lb (98 kg)  09/19/18 227 lb 8 oz (103.2 kg)  08/15/18 225 lb (102.1 kg)    Physical Exam  Unable to perform PE as  there were technical difficulties with the video technology.  Results for orders placed or performed in visit on 07/02/18  Cytology - PAP( [place])  Result Value Ref Range   Adequacy      Satisfactory for evaluation  endocervical/transformation zone component PRESENT.   Diagnosis      NEGATIVE FOR INTRAEPITHELIAL LESIONS OR MALIGNANCY.   Chlamydia Negative    Neisseria gonorrhea Negative    Material Submitted CervicoVaginal Pap [ThinPrep Imaged]       Assessment & Plan:   Problem List Items Addressed This Visit      Other   Anxiety and depression - Primary    Continue seroquel, will  trial trintellix and refer to Psychiatry for consultation given multiple medication failures in the past and no improvement with addition of counseling. F/u in 1 month if not already into psychiatry at that point.       Relevant Medications   vortioxetine HBr (TRINTELLIX) 5 MG TABS tablet   Other Relevant Orders   Ambulatory referral to Psychiatry   Insomnia    Continue seroquel at bedtime          Follow up plan: Return in about 4 weeks (around 01/10/2019) for Anxiety f/u.

## 2018-12-13 NOTE — Assessment & Plan Note (Signed)
Continue seroquel at bedtime

## 2018-12-17 ENCOUNTER — Encounter: Payer: Self-pay | Admitting: Family Medicine

## 2018-12-18 ENCOUNTER — Other Ambulatory Visit: Payer: Self-pay | Admitting: Family Medicine

## 2018-12-18 ENCOUNTER — Other Ambulatory Visit: Payer: Self-pay | Admitting: Family

## 2018-12-18 ENCOUNTER — Other Ambulatory Visit: Payer: Self-pay

## 2018-12-18 NOTE — Telephone Encounter (Signed)
Can this patient receive a refill on this medication?

## 2018-12-19 ENCOUNTER — Encounter: Payer: Self-pay | Admitting: Family Medicine

## 2018-12-19 MED ORDER — FEXOFENADINE HCL 180 MG PO TABS: 180.0000 mg | ORAL_TABLET | Freq: Every day | ORAL | 3 refills | Status: DC

## 2018-12-19 MED ORDER — QUETIAPINE FUMARATE 100 MG PO TABS: 100.0000 mg | ORAL_TABLET | Freq: Every day | ORAL | 0 refills | Status: DC

## 2018-12-19 MED ORDER — OMEPRAZOLE 40 MG PO CPDR: 40.0000 mg | DELAYED_RELEASE_CAPSULE | Freq: Every day | ORAL | 3 refills | Status: DC

## 2018-12-19 MED ORDER — PLECANATIDE 3 MG PO TABS: 3.0000 mg | ORAL_TABLET | Freq: Every day | ORAL | 3 refills | Status: DC

## 2018-12-19 NOTE — Telephone Encounter (Signed)
Not a patient of our office

## 2018-12-31 ENCOUNTER — Telehealth: Payer: No Typology Code available for payment source | Admitting: Physician Assistant

## 2018-12-31 DIAGNOSIS — T753XXA Motion sickness, initial encounter: Secondary | ICD-10-CM

## 2018-12-31 DIAGNOSIS — R110 Nausea: Secondary | ICD-10-CM | POA: Diagnosis not present

## 2018-12-31 MED ORDER — SCOPOLAMINE 1 MG/3DAYS TD PT72: 1.0000 | MEDICATED_PATCH | TRANSDERMAL | 0 refills | Status: DC

## 2018-12-31 NOTE — Progress Notes (Signed)
E Visit for Motion Sickness  We are sorry that you are not feeling well. Here is how we plan to help!  Based on what you have shared with me it looks like you have symptoms of motion sickness.  I have prescribed a medication that will help prevent or alleviate your symptoms:  Scopolamine Transdermal 1 mg patch behind ear at least 4 hours prior to travel (preferably 12 hours)   Prevention:  You might feel better if you keep your eyes focused on outside while you are in motion. For example, if you are in a car, sit in the front and look in the direction you are moving; if you are on a boat, stay on the deck and look to the horizon. This helps make what you see match the movement you are feeling, and so you are less likely to feel sick.  You should also avoid reading, watching a movie, texting or reading messages, or looking at things close to you inside the vehicle you are riding in.  Use the seat head rest. Lean your head against the back of the seat or head rest when traveling in vehicles with seats to minimize head movements.  On a ship: When making your reservations, choose a cabin in the middle of the ship and near the waterline. When on board, go up on deck and focus on the horizon.  In an airplane: Request a window seat and look out the window. A seat over the front edge of the wing is the most preferable spot (the degree of motion is the lowest here). Direct the air vent to blow cool air on your face.  On a train: Always face forward and sit near a window.  In a vehicle: Sit in the front seat; if you are the passenger, look at the scenery in the distance. For some people, driving the vehicle (rather than being a passenger) is an instant remedy.  Avoid others who have become nauseous with motion sickness. Seeing and smelling others who have motion sickness may cause you to become sick.  GET HELP RIGHT AWAY IF:  Your symptoms do not improve or worsen within 2 days after  treatment.  You cannot keep down fluids after trying the medication.  Other associated symptoms such as severe headache, visual field changes, fever, or intractable nausea and vomiting.  MAKE SURE YOU:  Understand these instructions. Will watch your condition. Will get help right away if you are not doing well or get worse.  Thank you for choosing an e-visit.  Your e-visit answers were reviewed by a board certified advanced clinical practitioner to complete your personal care plan. Depending upon the condition, your plan could have included both over the counter or prescription medications.  Please review your pharmacy choice. Be sure that the pharmacy you have chosen is open so that you can pick up your prescription now.  If there is a problem you may message your provider in MyChart to have the prescription routed to another pharmacy.  Your safety is important to us. If you have drug allergies check your prescription carefully.   For the next 24 hours, you can use MyChart to ask questions about today's visit, request a non-urgent call back, or ask for a work or school excuse from your e-visit provider.  You will get an e-mail in the next two days asking about your experience. I hope that your e-visit has been valuable and will speed your recovery.   References or for more   information: ThenWeb.com.ee https://my.ResearchRoots.be https://www.uptodate.com  ===View-only below this line===   ----- Message -----    From: Adelene Idler    Sent: 12/31/2018  2:42 PM EDT      To: E-Visit Mailing List Subject: E-Visit Submission: Motion Sickness  E-Visit Submission: Motion Sickness --------------------------------  Question: Are you planning to travel on an airplane, boat or long trip in a car?   Answer:   Yes  Question: Do you have a history of motion sickness when  traveling?  Answer:   No  Question: Have you ever taken any medications for motion sickness?  Answer:   No  Question: Have you been experiencing any of the following symptoms of motion sickness? Elta [place] all that apply)?  Answer:   Nausea            Excessive salivation            Loss of appetite            Drowsiness            General discomfort  Question: Are you experiencing any or all of these symptoms now?  Answer:   No  Question: Any additional symptoms or comments:  Answer:   Stomach fullness  Question: Have you tried any over the counter medications? Answer:   No  Question: If yes, please indicate which of the following you have tried: Answer:     Question: Please indicate which of the medications were effective: Answer:     Question: Do you have a history of or currently take any medications for urinary retention, or have an enlarged prostate?  Answer:   No  Question: Do you have a history of or currently take any medications for Glaucoma?  Answer:   No  Question: Please list your medication allergies that you may have ? (If 'none' , please list as 'none') Answer:   None  Question: Are you pregnant? Answer:   I am confident that I am not pregnant  Question: Are you breastfeeding? Answer:   No  Question: Please list any additional comments  Answer:     A total of 5-10 minutes was spent evaluating this patients questionnaire and formulating a plan of care.

## 2019-01-09 ENCOUNTER — Encounter: Payer: Self-pay | Admitting: *Deleted

## 2019-01-10 ENCOUNTER — Other Ambulatory Visit: Payer: Self-pay

## 2019-01-10 ENCOUNTER — Ambulatory Visit (INDEPENDENT_AMBULATORY_CARE_PROVIDER_SITE_OTHER): Payer: No Typology Code available for payment source | Admitting: Obstetrics & Gynecology

## 2019-01-10 ENCOUNTER — Encounter: Payer: Self-pay | Admitting: Obstetrics & Gynecology

## 2019-01-10 VITALS — BP 116/82 | HR 82 | Ht 66.0 in | Wt 222.0 lb

## 2019-01-10 DIAGNOSIS — M958 Other specified acquired deformities of musculoskeletal system: Secondary | ICD-10-CM | POA: Diagnosis not present

## 2019-01-10 NOTE — Progress Notes (Signed)
Chief Complaint  Patient presents with  . knot above belly button    ? hernia      28 y.o. 1939 Patient's last menstrual period was 12/10/2018 (approximate). The current method of family planning is none.  Outpatient Encounter Medications as of 01/10/2019  Medication Sig  . fexofenadine (ALLEGRA ALLERGY) 180 MG tablet Take 1 tablet (180 mg total) by mouth daily.  Marland Kitchen omeprazole (PRILOSEC) 40 MG capsule Take 1 capsule (40 mg total) by mouth daily before lunch.  . Plecanatide (TRULANCE) 3 MG TABS Take 3 mg by mouth daily.  . QUEtiapine (SEROQUEL) 100 MG tablet Take 1 tablet (100 mg total) by mouth at bedtime.  . vortioxetine HBr (TRINTELLIX) 5 MG TABS tablet Take 1 tablet (5 mg total) by mouth daily.  . [DISCONTINUED] scopolamine (TRANSDERM-SCOP, 1.5 MG,) 1 MG/3DAYS Place 1 patch (1.5 mg total) onto the skin every 3 (three) days.   No facility-administered encounter medications on file as of 01/10/2019.     Subjective Pt has experienced a tenderness and bulging above the umbilicus for the past 2-3 weeks Never benn there before, casuing sharp pain, firmness when stands and some abdominal pain  Some nausea no vomiting Past Medical History:  Diagnosis Date  . Abnormal Pap smear   . Anemia   . Anxiety   . Arthritis   . Chronic back pain   . Depression   . Dysrhythmia    irregular heartrate  . GERD (gastroesophageal reflux disease)   . Headache(784.0)   . Hx of chlamydia infection   . Hypothyroidism   . Ovarian cyst, right   . PCOS (polycystic ovarian syndrome)   . Perimenopausal   . Uterine fibroid     Past Surgical History:  Procedure Laterality Date  . BIOPSY  11/14/2016   Procedure: BIOPSY;  Surgeon: Danie Binder, MD;  Location: [facility];  Service: Endoscopy;;  duodenal gastric  . COLPOSCOPY    . ESOPHAGOGASTRODUODENOSCOPY (EGD) WITH PROPOFOL N/A 11/14/2016   Dr. Oneida Alar: Mild gastritis, normal esophagus, biopsy showed inflammation of the small bowel,  gastric biopsies with no H. pylori.  . lipoma surgery    . MASS EXCISION N/A 02/28/2017   Procedure: EXCISION LIPOMA SCALP 3CM;  Surgeon: Aviva Signs, MD;  Location: [facility] OR[facility];  Service: General;  Laterality: N/A;    OB History    Gravida  2   Para  2   Term  2   Preterm  0   AB  0   Living  2     SAB  0   TAB  0   Ectopic  0   Multiple  0   Live Births  2           Allergies  Allergen Reactions  . Pollen Extract Cough  . Latex Rash  . Other Rash    Coconut causes a rash    Social History   Socioeconomic History  . Marital status: Married    Spouse name: Not on file  . Number of children: Not on file  . Years of education: Not on file  . Highest education level: Not on file  Occupational History  . Occupation: Theatre stage manager Needs  . Financial resource strain: Not on file  . Food insecurity    Worry: Not on file    Inability: Not on file  . Transportation needs    Medical: Not on file    Non-medical: Not on file  Tobacco  Use  . Smoking status: Former Smoker    Types: Cigarettes  . Smokeless tobacco: Never Used  Substance and Sexual Activity  . Alcohol use: Yes    Comment: occ  . Drug use: Yes    Types: Other-see comments    Comment: CBD drops  . Sexual activity: Yes    Partners: Male    Birth control/protection: None  Lifestyle  . Physical activity    Days per week: Not on file    Minutes per session: Not on file  . Stress: Not on file  Relationships  . Social Herbalist on phone: Not on file    Gets together: Not on file    Attends religious service: Not on file    Active member of club or organization: Not on file    Attends meetings of clubs or organizations: Not on file    Relationship status: Not on file  Other Topics Concern  . Not on file  Social History Narrative  . Not on file    Family History  Problem Relation Age of Onset  . Diabetes Father   . Hypertension Father   . Other Son        reactive  airway disease  . Asthma Son   . Heart murmur Son   . Cancer Maternal Grandmother        lung  . Lung cancer Maternal Grandmother   . Heart disease Paternal Grandmother   . Hypertension Paternal Grandmother   . Liver disease Paternal Grandfather        etoh related  . Colon cancer Neg Hx     Medications:       Current Outpatient Medications:  .  fexofenadine (ALLEGRA ALLERGY) 180 MG tablet, Take 1 tablet (180 mg total) by mouth daily., Disp: 30 tablet, Rfl: 3 .  omeprazole (PRILOSEC) 40 MG capsule, Take 1 capsule (40 mg total) by mouth daily before lunch., Disp: 90 capsule, Rfl: 3 .  Plecanatide (TRULANCE) 3 MG TABS, Take 3 mg by mouth daily., Disp: 90 tablet, Rfl: 3 .  QUEtiapine (SEROQUEL) 100 MG tablet, Take 1 tablet (100 mg total) by mouth at bedtime., Disp: 30 tablet, Rfl: 0 .  vortioxetine HBr (TRINTELLIX) 5 MG TABS tablet, Take 1 tablet (5 mg total) by mouth daily., Disp: 30 tablet, Rfl: 0  Objective Blood pressure 116/82, pulse 82, height 5\' 6"  (1.676 m), weight 222 lb (100.7 kg), last menstrual period 12/10/2018.  Pt has a defect above umbilicus triangular shaped extends 10 cm above, I suspect distasis not hernia  Pertinent ROS No burning with urination, frequency or urgency No nausea, vomiting or diarrhea Nor fever chills or other constitutional symptoms   Labs or studies Order CT scan    Impression Diagnoses this Encounter::   ICD-10-CM   1. Abdominal wall defect, acquired  M95.8 CT ABDOMEN W CONTRAST    CT ABDOMEN W CONTRAST   supraumbilical: diastasis vs hernia, suspect diastasis    Established relevant diagnosis(es):   Plan/Recommendations: No orders of the defined types were placed in this encounter.   Labs or Scans Ordered: Orders Placed This Encounter  Procedures  . CT ABDOMEN W CONTRAST    Management:: >Rectus diastasis vs supra umbilical hernia, given location and timing I suspect muscle defect over true fascial defect but will order CT  scan to evaluate the fascial integrity >if requires repair will refer to Dr Renold Genta for evaluation  Follow up Return if symptoms worsen or fail to  improve.     All questions were answered.

## 2019-01-13 ENCOUNTER — Ambulatory Visit: Payer: No Typology Code available for payment source | Admitting: Family Medicine

## 2019-01-13 ENCOUNTER — Other Ambulatory Visit: Payer: Self-pay | Admitting: Family Medicine

## 2019-01-14 MED ORDER — QUETIAPINE FUMARATE 100 MG PO TABS: 100.0000 mg | ORAL_TABLET | Freq: Every day | ORAL | 0 refills | Status: DC

## 2019-01-15 ENCOUNTER — Telehealth: Payer: No Typology Code available for payment source | Admitting: Family

## 2019-01-15 DIAGNOSIS — R102 Pelvic and perineal pain: Secondary | ICD-10-CM

## 2019-01-15 NOTE — Progress Notes (Signed)
Based on what you shared with me, I feel your condition warrants further evaluation and I recommend that you be seen for a face to face office visit.  I recommend calling your provider that started the megestrol and let them know about your reaction. Then they can change the medication accordingly.    NOTE: If you entered your credit card information for this eVisit, you will not be charged. You may see a "hold" on your card for the $35 but that hold will drop off and you will not have a charge processed.  If you are having a true medical emergency please call 911.     For an urgent face to face visit, [place] has five urgent care centers for your convenience:    [email or web address] to reserve your spot online an avoid wait times  [facility] [address] [place], [place] [address] Modified hours of operation: Monday-Friday, 12 PM to 6 PM  Closed Saturday & Sunday  *Across the street from [place] (New Address!) [address] [place], [place] [address] *Just off [address], across the road from [place] hours of operation: Monday-Friday, 12 PM to 6 PM  Closed Saturday & Sunday   The following sites will take your insurance:  . [facility]    [phone]                  Get Driving Directions  [address], [place] [address] . 10 am to 8 pm Monday-Friday . 12 pm to 8 pm Saturday-Sunday   . [facility] at [place]                  Get Driving Directions  [address], [place], [place] [address] . 8 am to 8 pm Monday-Friday . 9 am to 6 pm Saturday . 11 am to 6 pm Sunday   . [facility] at [place]                  Get Driving Directions   [address] [place], [place] [address] . 8 am to 8 pm Monday-Friday . 8 am to 4 pm Saturday-Sunday     . [facility] at [place]                    Get Driving Directions  [phone]  [address] [place],  [address]  . Monday-Friday, 12 PM to 6 PM    Your e-visit answers were reviewed by a board certified advanced clinical practitioner to complete your personal care plan.  Thank you for using e-Visits.

## 2019-01-16 ENCOUNTER — Other Ambulatory Visit: Payer: Self-pay | Admitting: Obstetrics & Gynecology

## 2019-01-16 MED ORDER — MEGESTROL ACETATE 40 MG PO TABS: ORAL_TABLET | ORAL | 3 refills | Status: DC

## 2019-01-22 ENCOUNTER — Telehealth: Payer: No Typology Code available for payment source | Admitting: Family

## 2019-01-22 ENCOUNTER — Other Ambulatory Visit: Payer: Self-pay | Admitting: Family Medicine

## 2019-01-22 DIAGNOSIS — R300 Dysuria: Secondary | ICD-10-CM

## 2019-01-22 DIAGNOSIS — N898 Other specified noninflammatory disorders of vagina: Secondary | ICD-10-CM

## 2019-01-22 DIAGNOSIS — R109 Unspecified abdominal pain: Secondary | ICD-10-CM

## 2019-01-22 NOTE — Progress Notes (Signed)
Based on what you shared with me, I feel your condition warrants further evaluation and I recommend that you be seen for a face to face office visit.  Given your symptoms of a UTI symptoms and back pain you need to be seen face to face for further work up to rule out a more serious infection.   NOTE: If you entered your credit card information for this eVisit, you will not be charged. You may see a "hold" on your card for the $35 but that hold will drop off and you will not have a charge processed.  If you are having a true medical emergency please call 911.     For an urgent face to face visit, [place] has five urgent care centers for your convenience:    [email or web address] to reserve your spot online an avoid wait times  [facility] [address] [place], [place] [address] Modified hours of operation: Monday-Friday, 12 PM to 6 PM  Closed Saturday & Sunday  *Across the street from [place] (New Address!) [address], [place], [place] [address] *Just off [address], across the road from [place] hours of operation: Monday-Friday, 12 PM to 6 PM  Closed Saturday & Sunday   The following sites will take your insurance:  . [facility]    [phone]                  Get Driving Directions  [address], [place] [address] . 10 am to 8 pm Monday-Friday . 12 pm to 8 pm Saturday-Sunday   . [facility] at [place]                  Get Driving Directions  [address], [place], [place] [address] . 8 am to 8 pm Monday-Friday . 9 am to 6 pm Saturday . 11 am to 6 pm Sunday   . [facility] at [place]                  Get Driving Directions   [address] [place], [place] [address] . 8 am to 8 pm Monday-Friday . 8 am to 4 pm Saturday-Sunday    .  [facility] at [place]                    Get Driving Directions  [phone]  [address], [place], [place] [address]  . Monday-Friday, 12 PM to 6 PM    Your e-visit answers were reviewed by a board certified advanced clinical practitioner to complete your personal care plan.  Thank you for using e-Visits.

## 2019-01-23 MED ORDER — QUETIAPINE FUMARATE 100 MG PO TABS: 100.0000 mg | ORAL_TABLET | Freq: Every day | ORAL | 0 refills | Status: DC

## 2019-01-28 ENCOUNTER — Telehealth: Payer: No Typology Code available for payment source | Admitting: Physician Assistant

## 2019-01-28 ENCOUNTER — Telehealth: Payer: Self-pay | Admitting: Obstetrics & Gynecology

## 2019-01-28 DIAGNOSIS — R102 Pelvic and perineal pain: Secondary | ICD-10-CM

## 2019-01-28 MED ORDER — TERCONAZOLE 0.4 % VA CREA: 1.0000 | TOPICAL_CREAM | Freq: Every day | VAGINAL | 0 refills | Status: DC

## 2019-01-28 NOTE — Progress Notes (Signed)
Based on what you shared with me, I feel your condition warrants further evaluation and I recommend that you be seen for a face to face office visit.  NOTE: If you entered your credit card information for this eVisit, you will not be charged. You may see a "hold" on your card for the $35 but that hold will drop off and you will not have a charge processed.  If you are having a true medical emergency please call 911.     For an urgent face to face visit, [place] has five urgent care centers for your convenience:  ?  [email or web address] to reserve your spot online an avoid wait times  [facility] [address] [place], [place] [address] Modified hours of operation: Monday-Friday, 12 PM to 6 PM  Closed Saturday & Sunday  *Across the street from [place] (New Address!) [address], [place], [place] [address] *Just off [address], across the road from [place] hours of operation: Monday-Friday, 12 PM to 6 PM  Closed Saturday & Sunday   The following sites will take your insurance:  [facility] [place]  [address] [place], [place] [address] 10 am to 8 pm Monday-Friday 12 pm to 8 pm [facility] at [facility]  [phone]                  Get Driving Directions  [address], [place], [place] [address] 8 am to 8 pm Monday-Friday 9 am to 6 pm Saturday 11 am to 6 pm Sunday   [facility] [address] [place], [place] [address] 8 am to 8 pm Monday-Friday 8 am to 4 pm [facility] at [place]                    Get Driving Directions  [phone]  [address] [place], [place] [address]  Monday-Friday, 12  PM to 6 PM    Your e-visit answers were reviewed by a board certified advanced clinical practitioner to complete your personal care plan.  Thank you for using e-Visits.  ===View-only below this line===   ----- Message -----    From: Adelene Idler    Sent: 01/28/2019  8:42 AM EDT      To: E-Visit Mailing List Subject: Vaginal Symptoms  Vaginal Symptoms --------------------------------  Question: Which of the following are you experiencing? Answer:   Vaginal pain            Vaginal itching  Question: Are you having pain while passing urine? Answer:   No, I have no pain while urinating  Question: Which of the following applies to your vaginal discharge Answer:   I have a white/milky discharge  Question: Are you pregnant? Answer:   I am confident that I am not pregnant  Question: Are you breastfeeding? Answer:   No  Question: Which of the following are you experiencing? Answer:   None of the above  Question: Do you have any sores on your genitals? Answer:   Yes  Question: Have  you taken antibiotics recently? Answer:   I have not been on any antibiotics  Question: Do you do any of the following? Answer:   I take bubble baths            I use douches  Question: Which of the following applies to your menstrual period? Answer:   I had a menstrual period in the last 2 weeks  Question: Have you had similar symptoms in the past? Answer:   No, I have never had  these symptoms  Question: Please specify what other treatments worked for you in the past Answer:   Monistat 1 , i have used this 3 days ago . but the external area is still broken out and its and somewhat painful  Question: Do you have a fever? Answer:   No, I do not have a fever  Question: During the past 2 months, have you had sexual contact with a specific person for the first time? Answer:   No  Question: Has a person with whom you have had sexual contact been recently told they have a disease  possibly acquired through sex? Answer:   No  Question: Please list your medication allergies that you may have ? (If 'none' , please list as 'none') Answer:   None  Question: Please list any additional comments  Answer:

## 2019-01-30 ENCOUNTER — Telehealth: Payer: Self-pay | Admitting: Family Medicine

## 2019-01-30 ENCOUNTER — Encounter: Payer: Self-pay | Admitting: Family Medicine

## 2019-01-30 NOTE — Progress Notes (Signed)
Virtual Visit via Video Note  I connected with Emily Morales on 02/07/19 at 10:00 AM EDT by a video enabled telemedicine application and verified that I am speaking with the correct person using two identifiers.   I discussed the limitations of evaluation and management by telemedicine and the availability of in person appointments. The patient expressed understanding and agreed to proceed.      I discussed the assessment and treatment plan with the patient. The patient was provided an opportunity to ask questions and all were answered. The patient agreed with the plan and demonstrated an understanding of the instructions.   The patient was advised to call back or seek an in-person evaluation if the symptoms worsen or if the condition fails to improve as anticipated.  I provided 50 minutes of non-face-to-face time during this encounter.   Emily Clay, MD     Psychiatric Initial Adult Assessment   Patient Identification: Emily Morales MRN:  [id number] Date of Evaluation:  02/07/2019 Referral Source: [facility], [place] Chief Complaint:   Chief Complaint    Anxiety; Depression; Psychiatric Evaluation     Visit Diagnosis:    ICD-10-CM   1. MDD (major depressive disorder), recurrent episode, mild ([place])  F33.0   2. Panic disorder  F41.0     History of Present Illness:   Emily Morales is a 28 y.o. year old female with a history of anxiety, depression, migraine, who is referred for anxiety.   She states that she has been having worsening in panic attacks over the past few months.  She now has it almost every day.  She woke up last night due to panic attack, and denies any triggers.  She talks about her husband's history of infidelity many times in the past. She tends to get worried when her husband is not at home. She believes the relationship is getting better, and both of they are working on marriage. They will write a letter to each other every week.   Although she functions well at work, she feels "singled out" as she tends to be isolative in her room. She denies any negative feedback at work. 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 She had a period of being out of work for one week in May 2019 as she did not want to be bothered by anybody. She reports good relationship with her children, and has been able to be attentive to them. She enjoys playing with them on weekend. She reports good support system at home; her sister helps to take care of her children. She is planning to return to college and feels excited about this.  She feels mildly depressed.  She has middle insomnia due to anxiety/panic attacks.  She has difficulty in concentration.  She notices increase in appetite after starting quetiapine.  She has good motivation and energy.  She denies SI. She feels tense at times.  She denies irritability. She drinks a bottle of wine in a day, once a month. She drinks a glass of wine every day. She denies any craving for alcohol. She denies DUI/DWI. She denies drug use (although she tried marijuana in the past, it caused headache)  Medication- she discontinued trintellix 5 mg due to sexual side effect. Quetiapine 100 mg at least for several months  (206 lbs is her baseline weight according to the patient) Wt Readings from Last 3 Encounters:  02/04/19 215 lb (97.5 kg)  01/10/19 222  lb (100.7 kg)  10/30/18 216 lb (98 kg)    Associated Signs/Symptoms: Depression Symptoms:  depressed mood, insomnia, fatigue, difficulty concentrating, anxiety, panic attacks, (Hypo) Manic Symptoms:  denies decreased need for sleep, euphoria Anxiety Symptoms:  Excessive Worry, Panic Symptoms, Psychotic Symptoms:  denies AH, VH PTSD Symptoms: Negative  Past Psychiatric History:  Outpatient: depression for seven years Psychiatry admission: denies  Previous suicide attempt: denies  Past trials of medication: sertraline  (sexual side effect, tried 50mg ), fluoxetine (10mg ), citalopram, escitalopram, bupropion (150 mg), Trintellix (sexual side effect), venlafaxine, Buspar, quetiapine, clonazepam History of violence: denies   Previous Psychotropic Medications: Yes   Substance Abuse History in the last 12 months:  No.  Consequences of Substance Abuse: NA  Past Medical History:  Past Medical History:  Diagnosis Date  . Abnormal Pap smear   . Anemia   . Anxiety   . Arthritis   . Chronic back pain   . Depression   . Dysrhythmia    irregular heartrate  . GERD (gastroesophageal reflux disease)   . Headache(784.0)   . Hx of chlamydia infection   . Hypothyroidism   . Ovarian cyst, right   . PCOS (polycystic ovarian syndrome)   . Perimenopausal   . Uterine fibroid     Past Surgical History:  Procedure Laterality Date  . BIOPSY  11/14/2016   Procedure: BIOPSY;  Surgeon: Danie Binder, MD;  Location: [facility];  Service: Endoscopy;;  duodenal gastric  . COLPOSCOPY    . ESOPHAGOGASTRODUODENOSCOPY (EGD) WITH PROPOFOL N/A 11/14/2016   Dr. Oneida Alar: Mild gastritis, normal esophagus, biopsy showed inflammation of the small bowel, gastric biopsies with no H. pylori.  . lipoma surgery    . MASS EXCISION N/A 02/28/2017   Procedure: EXCISION LIPOMA SCALP 3CM;  Surgeon: Aviva Signs, MD;  Location: [facility];  Service: General;  Laterality: N/A;    Family Psychiatric History:  As below  Family History:  Family History  Problem Relation Age of Onset  . Diabetes Father   . Hypertension Father   . Other Son        reactive airway disease  . Asthma Son   . Heart murmur Son   . Depression Mother   . Anxiety disorder Mother   . Cancer Maternal Grandmother        lung  . Lung cancer Maternal Grandmother   . Heart disease Paternal Grandmother   . Hypertension Paternal Grandmother   . Liver disease Paternal Grandfather        etoh related  . Alcohol abuse Maternal Grandfather   . Colon cancer Neg Hx      Social History:   Social History   Socioeconomic History  . Marital status: Married    Spouse name: Not on file  . Number of children: Not on file  . Years of education: Not on file  . Highest education level: Not on file  Occupational History  . Occupation: Theatre stage manager Needs  . Financial resource strain: Not on file  . Food insecurity    Worry: Not on file    Inability: Not on file  . Transportation needs    Medical: Not on file    Non-medical: Not on file  Tobacco Use  . Smoking status: Former Smoker    Types: Cigarettes  . Smokeless tobacco: Never Used  Substance and Sexual Activity  . Alcohol use: Yes    Comment: occ  . Drug use: Yes    Types: Other-see comments  Comment: CBD drops  . Sexual activity: Yes    Partners: Male    Birth control/protection: None  Lifestyle  . Physical activity    Days per week: Not on file    Minutes per session: Not on file  . Stress: Not on file  Relationships  . Social Herbalist on phone: Not on file    Gets together: Not on file    Attends religious service: Not on file    Active member of club or organization: Not on file    Attends meetings of clubs or organizations: Not on file    Relationship status: Not on file  Other Topics Concern  . Not on file  Social History Narrative  . Not on file    Additional Social History:  Married for nine years. She has two children (age 51,6). Her husband is a Administrator.  She describes her childhood as "hard." She used to live in subsidized housing with her mother, two sisters. Her mother was always at work, and her father was absent. She reports improved relationship with her father. She has good support from her paternal grandmother Education: completed one year of college. 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 hospital  Allergies:   Allergies  Allergen Reactions   . Pollen Extract Cough  . Latex Rash  . Other Rash    Coconut causes a rash    Metabolic Disorder Labs: No results found for: HGBA1C, MPG No results found for: PROLACTIN No results found for: CHOL, TRIG, HDL, CHOLHDL, VLDL, LDLCALC Lab Results  Component Value Date   TSH 2.690 05/02/2018    Therapeutic Level Labs: No results found for: LITHIUM No results found for: CBMZ No results found for: VALPROATE  Current Medications: Current Outpatient Medications  Medication Sig Dispense Refill  . fexofenadine (ALLEGRA ALLERGY) 180 MG tablet Take 1 tablet (180 mg total) by mouth daily. 30 tablet 3  . FLUoxetine (PROZAC) 10 MG capsule Take 1 capsule (10 mg total) by mouth daily. 7 capsule 0  . FLUoxetine (PROZAC) 20 MG capsule 20 mg daily. Start after completing 10 mg daily for one week 30 capsule 0  . megestrol (MEGACE) 40 MG tablet 3 tablets a day for 5 days, 2 tablets a day for 5 days then 1 tablet daily 45 tablet 3  . omeprazole (PRILOSEC) 40 MG capsule Take 1 capsule (40 mg total) by mouth daily before lunch. 90 capsule 3  . ondansetron (ZOFRAN ODT) 4 MG disintegrating tablet Take 1 tablet (4 mg total) by mouth every 8 (eight) hours as needed. 20 tablet 0  . Plecanatide (TRULANCE) 3 MG TABS Take 3 mg by mouth daily. 90 tablet 3  . QUEtiapine (SEROQUEL) 100 MG tablet Take 1 tablet (100 mg total) by mouth at bedtime. 30 tablet 0  . SUMAtriptan (IMITREX) 50 MG tablet Take 1 tab at onset of migraine. May repeat in 2 hours if headache persists or recurs. 10 tablet 0  . vortioxetine HBr (TRINTELLIX) 5 MG TABS tablet Take 1 tablet (5 mg total) by mouth daily. 30 tablet 0   No current facility-administered medications for this visit.     Musculoskeletal: Strength & Muscle Tone: N/A Gait & Station: N/A Patient leans: N/A  Psychiatric Specialty Exam: Review of Systems  Psychiatric/Behavioral: Positive for depression. Negative for hallucinations, memory loss, substance abuse and suicidal  ideas. The patient is  nervous/anxious and has insomnia.   All other systems reviewed and are negative.   There were no vitals taken for this visit.There is no height or weight on file to calculate BMI.  General Appearance: Fairly Groomed  Eye Contact:  Good  Speech:  Clear and Coherent  Volume:  Normal  Mood:  Anxious  Affect:  Appropriate, Congruent and reactive  Thought Process:  Coherent  Orientation:  Full (Time, Place, and Person)  Thought Content:  Logical  Suicidal Thoughts:  No  Homicidal Thoughts:  No  Memory:  Immediate;   Good  Judgement:  Good  Insight:  Good  Psychomotor Activity:  Normal  Concentration:  Concentration: Good and Attention Span: Good  Recall:  Good  Fund of Knowledge:Good  Language: Good  Akathisia:  No  Handed:  Right  AIMS (if indicated):  not done  Assets:  Communication Skills Desire for Improvement  ADL's:  Intact  Cognition: WNL  Sleep:  Poor   Screenings: GAD-7     Video Visit from 12/13/2018 in [place] Visit from 08/15/2018 in [place]  Total GAD-7 Score  12  15    PHQ2-9     Video Visit from 12/13/2018 in [place] Visit from 08/15/2018 in [place]  PHQ-2 Total Score  4  4  PHQ-9 Total Score  11  16     Assessment and Plan:  KATONYA BLECHER is a 28 y.o. year old female with a history of anxiety, depression, migraine, who is referred for anxiety.   # MDD, mild, recurrent without psychotic features # Panic disorder Patient reports worsening in panic attacks and has mild depressive symptoms.  Psychosocial stressors includes marital conflict, although she reports improvement in the relationship with her husband.  Noted that although she did have a trial of antidepressant, she tried it only low-dose for a short period. Will retry fluoxetine to target depression, panic attacks. Will do slow uptitration to avoid worsening in anxiety. Discussed risk of serotonin  syndrome in the context of using Imitrex.  Will continue quetiapine at this time as adjunctive treatment for depression and anxiety.  Discussed potential metabolic side effect.  She will greatly benefit from CBT; will make referral.   Plan 1. Start fluoxetine 10 mg daily for one week, then 20 mg daily  2. Continue quetiapine 100 mg at night  3. Referral to therapy 4. Next appointment: 8/11 at 11:30 for 30 mins, video  The patient demonstrates the following risk factors for suicide: Chronic risk factors for suicide include: psychiatric disorder of depression, anxiety. Acute risk factors for suicide include: family or marital conflict. Protective factors for this patient include: positive social support, responsibility to others (children, family), coping skills and hope for the future. Considering these factors, the overall suicide risk at this point appears to be low. Patient is appropriate for outpatient follow up.   Emily Clay, MD 7/10/202011:02 AM

## 2019-01-30 NOTE — Telephone Encounter (Signed)
Called pt to schedule no answer left VM

## 2019-01-30 NOTE — Telephone Encounter (Signed)
Please see below patient mychart message - needs appt for evaluation  I have been having frequent headaches, i use to have very bad migraines and Excedrin migraine would help but lately nothing has been helping and they last for 3 days at the most, i dont know if they have started back due to having to use a headset for work now or not but i need some relief i cant even spend time with my kids when i get off due to wanting to lay down

## 2019-02-03 ENCOUNTER — Other Ambulatory Visit: Payer: Self-pay

## 2019-02-03 DIAGNOSIS — G8929 Other chronic pain: Secondary | ICD-10-CM

## 2019-02-03 DIAGNOSIS — M545 Low back pain, unspecified: Secondary | ICD-10-CM

## 2019-02-04 ENCOUNTER — Encounter: Payer: Self-pay | Admitting: Family Medicine

## 2019-02-04 ENCOUNTER — Ambulatory Visit (INDEPENDENT_AMBULATORY_CARE_PROVIDER_SITE_OTHER): Payer: No Typology Code available for payment source | Admitting: Family Medicine

## 2019-02-04 ENCOUNTER — Other Ambulatory Visit: Payer: Self-pay

## 2019-02-04 VITALS — Ht 65.0 in | Wt 215.0 lb

## 2019-02-04 DIAGNOSIS — G43109 Migraine with aura, not intractable, without status migrainosus: Secondary | ICD-10-CM | POA: Diagnosis not present

## 2019-02-04 DIAGNOSIS — R112 Nausea with vomiting, unspecified: Secondary | ICD-10-CM | POA: Diagnosis not present

## 2019-02-04 MED ORDER — SUMATRIPTAN SUCCINATE 50 MG PO TABS: ORAL_TABLET | ORAL | 0 refills | Status: DC

## 2019-02-04 MED ORDER — ONDANSETRON 4 MG PO TBDP: 4.0000 mg | ORAL_TABLET | Freq: Three times a day (TID) | ORAL | 0 refills | Status: DC | PRN

## 2019-02-04 NOTE — Progress Notes (Signed)
Ht 5\' 5"  (1.651 m)   Wt 215 lb (97.5 kg)   BMI 35.78 kg/m    Subjective:    Patient ID: CHRISTL FESSENDEN, female    DOB: [birth year], 28 y.o.   MRN: [id number]  HPI: DEANN MCLAINE is a 28 y.o. female  Chief Complaint  Patient presents with  . Migraine    Patient states her migraines are coming back and are severe. Has had a migraine ongoing 2 days.  . Nausea  . Emesis    . This visit was completed via WebEx due to the restrictions of the COVID-19 pandemic. All issues as above were discussed and addressed. Physical exam was done as above through visual confirmation on WebEx. If it was felt that the patient should be evaluated in the office, they were directed there. The patient verbally consented to this visit. . Location of the patient: home . Location of the provider: home . Those involved with this call:  . Provider: Merrie Roof, PA-C . CMA: Merilyn Baba, [place] . Front Desk/Registration: Don Perking  . Time spent on call: 15 minutes with patient face to face via video conference. More than 50% of this time was spent in counseling and coordination of care. 5 minutes total spent in review of patient's record and preparation of their chart. I verified patient identity using two factors (patient name and date of birth). Patient consents verbally to being seen via telemedicine visit today.   Migraines started back about 2 weeks ago, has had a severe migraine the past 48 hours. Has had 3 in that time span. Has been taking excedrin migraine with no relief. Previously had migraines often but has not had issues for several years now up until recently. Gets visual auras prior to onset and some nausea and vomiting during them. Has never taken anything prescription grade for them in the past that she can recall.   Relevant past medical, surgical, family and social history reviewed and updated as indicated. Interim medical history since our last visit reviewed. Allergies and  medications reviewed and updated.  Review of Systems  Per HPI unless specifically indicated above     Objective:    Ht 5\' 5"  (1.651 m)   Wt 215 lb (97.5 kg)   BMI 35.78 kg/m   Wt Readings from Last 3 Encounters:  02/04/19 215 lb (97.5 kg)  01/10/19 222 lb (100.7 kg)  10/30/18 216 lb (98 kg)    Physical Exam Vitals signs and nursing note reviewed.  Constitutional:      General: She is not in acute distress.    Appearance: Normal appearance.  HENT:     Head: Atraumatic.     Right Ear: External ear normal.     Left Ear: External ear normal.     Nose: Nose normal. No congestion.     Mouth/Throat:     Mouth: Mucous membranes are moist.     Pharynx: Oropharynx is clear. No posterior oropharyngeal erythema.  Eyes:     Extraocular Movements: Extraocular movements intact.     Conjunctiva/sclera: Conjunctivae normal.  Neck:     Musculoskeletal: Normal range of motion.  Cardiovascular:     Comments: Unable to assess via virtual visit Pulmonary:     Effort: Pulmonary effort is normal. No respiratory distress.  Musculoskeletal: Normal range of motion.  Skin:    General: Skin is dry.     Findings: No erythema.  Neurological:     Mental Status: She is alert and  oriented to person, place, and time.  Psychiatric:        Mood and Affect: Mood normal.        Thought Content: Thought content normal.        Judgment: Judgment normal.     Results for orders placed or performed in visit on 07/02/18  Cytology - PAP( [facility])  Result Value Ref Range   Adequacy      Satisfactory for evaluation  endocervical/transformation zone component PRESENT.   Diagnosis      NEGATIVE FOR INTRAEPITHELIAL LESIONS OR MALIGNANCY.   Chlamydia Negative    Neisseria gonorrhea Negative    Material Submitted CervicoVaginal Pap [ThinPrep Imaged]       Assessment & Plan:   Problem List Items Addressed This Visit    None    Visit Diagnoses    Migraine with aura and without status migrainosus,  not intractable    -  Primary   Will start imitrex prn, can take OTC pain relievers prn. F/u if not resolving   Relevant Medications   SUMAtriptan (IMITREX) 50 MG tablet   Non-intractable vomiting with nausea, unspecified vomiting type       Zofran prn for migraine induced N/V       Follow up plan: Return for as scheduled.

## 2019-02-05 ENCOUNTER — Ambulatory Visit (HOSPITAL_COMMUNITY): Admission: RE | Admit: 2019-02-05 | Payer: No Typology Code available for payment source | Source: Ambulatory Visit

## 2019-02-06 ENCOUNTER — Encounter: Payer: Self-pay | Admitting: Family Medicine

## 2019-02-07 ENCOUNTER — Telehealth: Payer: No Typology Code available for payment source | Admitting: Family

## 2019-02-07 ENCOUNTER — Other Ambulatory Visit: Payer: Self-pay

## 2019-02-07 ENCOUNTER — Ambulatory Visit (INDEPENDENT_AMBULATORY_CARE_PROVIDER_SITE_OTHER): Payer: No Typology Code available for payment source | Admitting: Psychiatry

## 2019-02-07 ENCOUNTER — Encounter (HOSPITAL_COMMUNITY): Payer: Self-pay | Admitting: Psychiatry

## 2019-02-07 DIAGNOSIS — H60331 Swimmer's ear, right ear: Secondary | ICD-10-CM

## 2019-02-07 DIAGNOSIS — F410 Panic disorder [episodic paroxysmal anxiety] without agoraphobia: Secondary | ICD-10-CM | POA: Diagnosis not present

## 2019-02-07 DIAGNOSIS — F330 Major depressive disorder, recurrent, mild: Secondary | ICD-10-CM | POA: Diagnosis not present

## 2019-02-07 MED ORDER — FLUOXETINE HCL 10 MG PO CAPS: 10.0000 mg | ORAL_CAPSULE | Freq: Every day | ORAL | 0 refills | Status: DC

## 2019-02-07 MED ORDER — FLUOXETINE HCL 20 MG PO CAPS: ORAL_CAPSULE | ORAL | 0 refills | Status: DC

## 2019-02-07 MED ORDER — CIPRODEX 0.3-0.1 % OT SUSP: 4.0000 [drp] | Freq: Two times a day (BID) | OTIC | 0 refills | Status: DC

## 2019-02-07 NOTE — Progress Notes (Signed)
Greater than 5 minutes, yet less than 10 minutes of time have been spent researching, coordinating, and implementing care for this patient today.  Thank you for the details you included in the comment boxes. Those details are very helpful in determining the best course of treatment for you and help [place] to provide the best care.  E Visit for Swimmer's Ear  We are sorry that you are not feeling well. Here is how we plan to help!  Based on what you have shared with me it looks like you have swimmers ear. Swimmer's ear is a redness or swelling, irritation, or infection of your outer ear canal.  These symptoms usually occur within a few days of swimming.  Your ear canal is a tube that goes from the opening of the ear to the eardrum.  When water stays in your ear canal, germs can grow.  This is a painful condition that often happens to children and swimmers of all ages.  It is not contagious and oral antibiotics are not required to treat uncomplicated swimmer's ear.  The usual symptoms include: Itching inside the ear, Redness or a sense of swelling in the ear, Pain when the ear is tugged on when pressure is placed on the ear, Pus draining from the infected ear. and I have prescribed: Ciprofloxin 0.2% and hydrocortisone 1% otic suspension 3 drops in affected ears twice daily for 7 days    In certain cases swimmer's ear may progress to a more serious bacterial infection of the middle or inner ear.  If you have a fever 102 and up and significantly worsening symptoms, this could indicate a more serious infection moving to the middle/inner and needs face to face evaluation in an office by a provider.  Your symptoms should improve over the next 3 days and should resolve in about 7 days.  HOME CARE:   Wash your hands frequently.  Do not place the tip of the bottle on your ear or touch it with your fingers.  You can take Acetominophen 650 mg every 4-6 hours as needed for pain.  If pain is severe or moderate,  you can apply a heating pad (set on low) or hot water bottle (wrapped in a towel) to outer ear for 20 minutes.  This will also increase drainage.  Avoid ear plugs  Do not use Q-tips  After showers, help the water run out by tilting your head to one side.  GET HELP RIGHT AWAY IF:   Fever is over 102.2 degrees.  You develop progressive ear pain or hearing loss.  Ear symptoms persist longer than 3 days after treatment.  MAKE SURE YOU:   Understand these instructions.  Will watch your condition.  Will get help right away if you are not doing well or get worse.  TO PREVENT SWIMMER'S EAR:  Use a bathing cap or custom fitted swim molds to keep your ears dry.  Towel off after swimming to dry your ears.  Tilt your head or pull your earlobes to allow the water to escape your ear canal.  If there is still water in your ears, consider using a hairdryer on the lowest setting.  Thank you for choosing an e-visit. Your e-visit answers were reviewed by a board certified advanced clinical practitioner to complete your personal care plan. Depending upon the condition, your plan could have included both over the counter or prescription medications. Please review your pharmacy choice. Be sure that the pharmacy you have chosen is open so  that you can pick up your prescription now.  If there is a problem you may message your provider in [place] to have the prescription routed to another pharmacy. Your safety is important to [place]. If you have drug allergies check your prescription carefully.  For the next 24 hours, you can use MyChart to ask questions about today's visit, request a non-urgent call back, or ask for a work or school excuse from your e-visit provider. You will get an email in the next two days asking about your experience. I hope that your e-visit has been valuable and will speed your recovery.

## 2019-02-07 NOTE — Patient Instructions (Signed)
1. Start fluoxetine 10 mg daily for one week, then 20 mg daily  2. Continue quetiapine 100 mg at night  3. Referral to therapy 4. Next appointment: 8/11 at 11:30

## 2019-02-13 ENCOUNTER — Encounter: Payer: Self-pay | Admitting: Family Medicine

## 2019-02-13 ENCOUNTER — Other Ambulatory Visit: Payer: Self-pay | Admitting: Family Medicine

## 2019-02-14 ENCOUNTER — Other Ambulatory Visit: Payer: Self-pay | Admitting: Family Medicine

## 2019-02-14 ENCOUNTER — Ambulatory Visit (HOSPITAL_COMMUNITY)
Admission: RE | Admit: 2019-02-14 | Discharge: 2019-02-14 | Disposition: A | Discharge status: Home / Self Care | Payer: No Typology Code available for payment source | Source: Ambulatory Visit | Attending: Obstetrics & Gynecology | Admitting: Obstetrics & Gynecology

## 2019-02-14 ENCOUNTER — Encounter (HOSPITAL_COMMUNITY): Payer: Self-pay

## 2019-02-14 ENCOUNTER — Other Ambulatory Visit: Payer: Self-pay

## 2019-02-14 DIAGNOSIS — M958 Other specified acquired deformities of musculoskeletal system: Secondary | ICD-10-CM | POA: Insufficient documentation

## 2019-02-14 MED ORDER — IOHEXOL 300 MG/ML  SOLN
100.0000 mL | Freq: Once | INTRAMUSCULAR | Status: AC | PRN
  Administered 2019-02-14: 100 mL via INTRAVENOUS

## 2019-02-14 MED ORDER — QUETIAPINE FUMARATE 50 MG PO TABS: 25.0000 mg | ORAL_TABLET | Freq: Every day | ORAL | 0 refills | Status: DC

## 2019-02-17 MED ORDER — QUETIAPINE FUMARATE 100 MG PO TABS: 100.0000 mg | ORAL_TABLET | Freq: Every day | ORAL | 0 refills | Status: DC

## 2019-02-25 ENCOUNTER — Ambulatory Visit: Payer: No Typology Code available for payment source | Admitting: Gastroenterology

## 2019-03-10 NOTE — Progress Notes (Deleted)
BH MD/PA/NP OP Progress Note  03/10/2019 4:30 PM Emily Morales  MRN:  [id number]  Chief Complaint:  HPI: *** Visit Diagnosis: No diagnosis found.  Past Psychiatric History: Please see initial evaluation for full details. I have reviewed the history. No updates at this time.     Past Medical History:  Past Medical History:  Diagnosis Date  . Abnormal Pap smear   . Anemia   . Anxiety   . Arthritis   . Chronic back pain   . Depression   . Dysrhythmia    irregular heartrate  . GERD (gastroesophageal reflux disease)   . Headache(784.0)   . Hx of chlamydia infection   . Hypothyroidism   . Ovarian cyst, right   . PCOS (polycystic ovarian syndrome)   . Perimenopausal   . Uterine fibroid     Past Surgical History:  Procedure Laterality Date  . BIOPSY  11/14/2016   Procedure: BIOPSY;  Surgeon: Danie Binder, MD;  Location: [facility];  Service: Endoscopy;;  duodenal gastric  . COLPOSCOPY    . ESOPHAGOGASTRODUODENOSCOPY (EGD) WITH PROPOFOL N/A 11/14/2016   Dr. Oneida Alar: Mild gastritis, normal esophagus, biopsy showed inflammation of the small bowel, gastric biopsies with no H. pylori.  . lipoma surgery    . MASS EXCISION N/A 02/28/2017   Procedure: EXCISION LIPOMA SCALP 3CM;  Surgeon: Aviva Signs, MD;  Location: [facility];  Service: General;  Laterality: N/A;    Family Psychiatric History: Please see initial evaluation for full details. I have reviewed the history. No updates at this time.     Family History:  Family History  Problem Relation Age of Onset  . Diabetes Father   . Hypertension Father   . Other Son        reactive airway disease  . Asthma Son   . Heart murmur Son   . Depression Mother   . Anxiety disorder Mother   . Cancer Maternal Grandmother        lung  . Lung cancer Maternal Grandmother   . Heart disease Paternal Grandmother   . Hypertension Paternal Grandmother   . Liver disease Paternal Grandfather        etoh related  . Alcohol abuse  Maternal Grandfather   . Colon cancer Neg Hx     Social History:  Social History   Socioeconomic History  . Marital status: Married    Spouse name: Not on file  . Number of children: Not on file  . Years of education: Not on file  . Highest education level: Not on file  Occupational History  . Occupation: Theatre stage manager Needs  . Financial resource strain: Not on file  . Food insecurity    Worry: Not on file    Inability: Not on file  . Transportation needs    Medical: Not on file    Non-medical: Not on file  Tobacco Use  . Smoking status: Former Smoker    Types: Cigarettes  . Smokeless tobacco: Never Used  Substance and Sexual Activity  . Alcohol use: Yes    Comment: occ  . Drug use: Yes    Types: Other-see comments    Comment: CBD drops  . Sexual activity: Yes    Partners: Male    Birth control/protection: None  Lifestyle  . Physical activity    Days per week: Not on file    Minutes per session: Not on file  . Stress: Not on file  Relationships  . Social connections  Talks on phone: Not on file    Gets together: Not on file    Attends religious service: Not on file    Active member of club or organization: Not on file    Attends meetings of clubs or organizations: Not on file    Relationship status: Not on file  Other Topics Concern  . Not on file  Social History Narrative  . Not on file    Allergies:  Allergies  Allergen Reactions  . Pollen Extract Cough  . Latex Rash  . Other Rash    Coconut causes a rash    Metabolic Disorder Labs: No results found for: HGBA1C, MPG No results found for: PROLACTIN No results found for: CHOL, TRIG, HDL, CHOLHDL, VLDL, LDLCALC Lab Results  Component Value Date   TSH 2.690 05/02/2018    Therapeutic Level Labs: No results found for: LITHIUM No results found for: VALPROATE No components found for:  CBMZ  Current Medications: Current Outpatient Medications  Medication Sig Dispense Refill  .  ciprofloxacin-dexamethasone (CIPRODEX) OTIC suspension Place 4 drops into the right ear 2 (two) times daily. For 7 days 7.5 mL 0  . fexofenadine (ALLEGRA ALLERGY) 180 MG tablet Take 1 tablet (180 mg total) by mouth daily. 30 tablet 3  . FLUoxetine (PROZAC) 10 MG capsule Take 1 capsule (10 mg total) by mouth daily. 7 capsule 0  . FLUoxetine (PROZAC) 20 MG capsule 20 mg daily. Start after completing 10 mg daily for one week 30 capsule 0  . megestrol (MEGACE) 40 MG tablet 3 tablets a day for 5 days, 2 tablets a day for 5 days then 1 tablet daily 45 tablet 3  . omeprazole (PRILOSEC) 40 MG capsule Take 1 capsule (40 mg total) by mouth daily before lunch. 90 capsule 3  . ondansetron (ZOFRAN ODT) 4 MG disintegrating tablet Take 1 tablet (4 mg total) by mouth every 8 (eight) hours as needed. 20 tablet 0  . Plecanatide (TRULANCE) 3 MG TABS Take 3 mg by mouth daily. 90 tablet 3  . QUEtiapine (SEROQUEL) 100 MG tablet Take 1 tablet (100 mg total) by mouth at bedtime. 30 tablet 0  . QUEtiapine (SEROQUEL) 50 MG tablet Take 0.5-1 tablets (25-50 mg total) by mouth at bedtime. 30 tablet 0  . SUMAtriptan (IMITREX) 50 MG tablet Take 1 tab at onset of migraine. May repeat in 2 hours if headache persists or recurs. 10 tablet 0  . vortioxetine HBr (TRINTELLIX) 5 MG TABS tablet Take 1 tablet (5 mg total) by mouth daily. 30 tablet 0   No current facility-administered medications for this visit.      Musculoskeletal: Strength & Muscle Tone: N/A Gait & Station: N/A Patient leans: N/A  Psychiatric Specialty Exam: ROS  There were no vitals taken for this visit.There is no height or weight on file to calculate BMI.  General Appearance: {Appearance:22683}  Eye Contact:  {B[facility] EYE CONTACT:22684}  Speech:  Clear and Coherent  Volume:  Normal  Mood:  {BHH MOOD:22306}  Affect:  {Affect (PAA):22687}  Thought Process:  Coherent  Orientation:  Full (Time, Place, and Person)  Thought Content: Logical   Suicidal Thoughts:   {ST/HT (PAA):22692}  Homicidal Thoughts:  {ST/HT (PAA):22692}  Memory:  Immediate;   Good  Judgement:  {Judgement (PAA):22694}  Insight:  {Insight (PAA):22695}  Psychomotor Activity:  Normal  Concentration:  Concentration: Good and Attention Span: Good  Recall:  Good  Fund of Knowledge: Good  Language: Good  Akathisia:  No  Handed:  Right  AIMS (if indicated): not done  Assets:  Communication Skills Desire for Improvement  ADL's:  Intact  Cognition: WNL  Sleep:  {B[facility] GOOD/FAIR/POOR:22877}   Screenings: GAD-7     Video Visit from 12/13/2018 in [place] Visit from 08/15/2018 in [facility]  Total GAD-7 Score  12  15    PHQ2-9     Video Visit from 12/13/2018 in [facility] Visit from 08/15/2018 in [place]  PHQ-2 Total Score  4  4  PHQ-9 Total Score  11  16       Assessment and Plan:  VEARL AITKEN is a 28 y.o. year old female with a history of depression, anxiety, migraine, who presents for follow up appointment for No diagnosis found.  # MDD, mild, recurrent without psychotic features # Panic disorder   Patient reports worsening in panic attacks and has mild depressive symptoms.  Psychosocial stressors includes marital conflict, although she reports improvement in the relationship with her husband.  Noted that although she did have a trial of antidepressant, she tried it only low-dose for a short period. Will retry fluoxetine to target depression, panic attacks. Will do slow uptitration to avoid worsening in anxiety. Discussed risk of serotonin syndrome in the context of using Imitrex.  Will continue quetiapine at this time as adjunctive treatment for depression and anxiety.  Discussed potential metabolic side effect.  She will greatly benefit from CBT; will make referral.   Plan 1. Start fluoxetine 10 mg daily for one week, then 20 mg daily  2. Continue quetiapine 100 mg at night  3. Referral to  therapy 4. Next appointment: 8/11 at 11:30 for 30 mins, video  The patient demonstrates the following risk factors for suicide: Chronic risk factors for suicide include: psychiatric disorder of depression, anxiety. Acute risk factors for suicide include: family or marital conflict. Protective factors for this patient include: positive social support, responsibility to others (children, family), coping skills and hope for the future. Considering these factors, the overall suicide risk at this point appears to be low. Patient is appropriate for outpatient follow up.   Norman Clay, MD 03/10/2019, 4:30 PM

## 2019-03-11 ENCOUNTER — Encounter: Payer: Self-pay | Admitting: General Surgery

## 2019-03-11 ENCOUNTER — Other Ambulatory Visit: Payer: Self-pay

## 2019-03-11 ENCOUNTER — Ambulatory Visit (INDEPENDENT_AMBULATORY_CARE_PROVIDER_SITE_OTHER): Payer: No Typology Code available for payment source | Admitting: General Surgery

## 2019-03-11 ENCOUNTER — Ambulatory Visit (HOSPITAL_COMMUNITY): Payer: No Typology Code available for payment source | Admitting: Psychiatry

## 2019-03-11 VITALS — BP 115/80 | HR 95 | Temp 97.1°F | Resp 16 | Ht 65.0 in | Wt 219.0 lb

## 2019-03-11 DIAGNOSIS — K429 Umbilical hernia without obstruction or gangrene: Secondary | ICD-10-CM

## 2019-03-11 DIAGNOSIS — K439 Ventral hernia without obstruction or gangrene: Secondary | ICD-10-CM

## 2019-03-11 NOTE — Patient Instructions (Signed)
Umbilical Hernia, Adult  A hernia is a bulge of tissue that pushes through an opening between muscles. An umbilical hernia happens in the abdomen, near the belly button (umbilicus). The hernia may contain tissues from the small intestine, large intestine, or fatty tissue covering the intestines (omentum). Umbilical hernias in adults tend to get worse over time, and they require surgical treatment. There are several types of umbilical hernias. You may have:  A hernia located just above or below the umbilicus (indirect hernia). This is the most common type of umbilical hernia in adults.  A hernia that forms through an opening formed by the umbilicus (direct hernia).  A hernia that comes and goes (reducible hernia). A reducible hernia may be visible only when you strain, lift something heavy, or cough. This type of hernia can be pushed back into the abdomen (reduced).  A hernia that traps abdominal tissue inside the hernia (incarcerated hernia). This type of hernia cannot be reduced.  A hernia that cuts off blood flow to the tissues inside the hernia (strangulated hernia). The tissues can start to die if this happens. This type of hernia requires emergency treatment. What are the causes? An umbilical hernia happens when tissue inside the abdomen presses on a weak area of the abdominal muscles. What increases the risk? You may have a greater risk of this condition if you:  Are obese.  Have had several pregnancies.  Have a buildup of fluid inside your abdomen (ascites).  Have had surgery that weakens the abdominal muscles. What are the signs or symptoms? The main symptom of this condition is a painless bulge at or near the belly button. A reducible hernia may be visible only when you strain, lift something heavy, or cough. Other symptoms may include:  Dull pain.  A feeling of pressure. Symptoms of a strangulated hernia may include:  Pain that gets increasingly worse.  Nausea and  vomiting.  Pain when pressing on the hernia.  Skin over the hernia becoming red or purple.  Constipation.  Blood in the stool. How is this diagnosed? This condition may be diagnosed based on:  A physical exam. You may be asked to cough or strain while standing. These actions increase the pressure inside your abdomen and force the hernia through the opening in your muscles. Your health care provider may try to reduce the hernia by pressing on it.  Your symptoms and medical history. How is this treated? Surgery is the only treatment for an umbilical hernia. Surgery for a strangulated hernia is done as soon as possible. If you have a small hernia that is not incarcerated, you may need to lose weight before having surgery. Follow these instructions at home:  Lose weight, if told by your health care provider.  Do not try to push the hernia back in.  Watch your hernia for any changes in color or size. Tell your health care provider if any changes occur.  You may need to avoid activities that increase pressure on your hernia.  Do not lift anything that is heavier than 10 lb (4.5 kg) until your health care provider says that this is safe.  Take over-the-counter and prescription medicines only as told by your health care provider.  Keep all follow-up visits as told by your health care provider. This is important. Contact a health care provider if:  Your hernia gets larger.  Your hernia becomes painful. Get help right away if:  You develop sudden, severe pain near the area of your hernia.    You have pain as well as nausea or vomiting.  You have pain and the skin over your hernia changes color.  You develop a fever. This information is not intended to replace advice given to you by your health care provider. Make sure you discuss any questions you have with your health care provider. Document Released: 12/17/2015 Document Revised: 08/29/2017 Document Reviewed: 01/15/2017 Elsevier  Patient Education  2020 [employer]   Open Hernia Repair, Adult  Open hernia repair is a surgical procedure to fix a hernia. A hernia occurs when an internal organ or tissue pushes out through a weak spot in the abdominal wall muscles. Hernias commonly occur in the groin and around the navel. Most hernias tend to get worse over time. Often, surgery is done to prevent the hernia from becoming bigger, uncomfortable, or an emergency. Emergency surgery may be needed if abdominal contents get stuck in the opening (incarcerated hernia) or the blood supply gets cut off (strangulated hernia). In an open repair, an incision is made in the abdomen to perform the surgery. Tell a health care provider about:  Any allergies you have.  All medicines you are taking, including vitamins, herbs, eye drops, creams, and over-the-counter medicines.  Any problems you or family members have had with anesthetic medicines.  Any blood or bone disorders you have.  Any surgeries you have had.  Any medical conditions you have, including any recent cold or flu symptoms.  Whether you are pregnant or may be pregnant. What are the risks? Generally, this is a safe procedure. However, problems may occur, including:  Long-lasting (chronic) pain.  Bleeding.  Infection.  Damage to the testicle. This can cause shrinking or swelling.  Damage to the bladder, blood vessels, intestine, or nerves near the hernia.  Trouble passing urine.  Allergic reactions to medicines.  Return of the hernia. Medicines  Ask your health care provider about: ? Changing or stopping your regular medicines. This is especially important if you are taking diabetes medicines or blood thinners. ? Taking medicines such as aspirin and ibuprofen. These medicines can thin your blood. Do not take these medicines before your procedure if your health care provider instructs you not to.  You may be given antibiotic medicine to help prevent  infection. General instructions  You may have blood tests or imaging studies.  Ask your health care provider how your surgical site will be marked or identified.  If you smoke, do not smoke for at least 2 weeks before your procedure or for as long as told by your health care provider.  Let your health care provider know if you develop a cold or any infection before your surgery.  Plan to have someone take you home from the hospital or clinic.  If you will be going home right after the procedure, plan to have someone with you for 24 hours. What happens during the procedure?  To reduce your risk of infection: ? Your health care team will wash or sanitize their hands. ? Your skin will be washed with soap. ? Hair may be removed from the surgical area.  An IV tube will be inserted into one of your veins.  You will be given one or more of the following: ? A medicine to help you relax (sedative). ? A medicine to numb the area (local anesthetic). ? A medicine to make you fall asleep (general anesthetic).  Your surgeon will make an incision over the hernia.  The tissues of the hernia will be moved   back into place.  The edges of the hernia may be stitched together.  The opening in the abdominal muscles will be closed with stitches (sutures). Or, your surgeon will place a mesh patch made of manmade (synthetic) material over the opening.  The incision will be closed.  A bandage (dressing) may be placed over the incision. The procedure may vary among health care providers and hospitals. What happens after the procedure?  Your blood pressure, heart rate, breathing rate, and blood oxygen level will be monitored until the medicines you were given have worn off.  You may be given medicine for pain.  Do not drive for 24 hours if you received a sedative. This information is not intended to replace advice given to you by your health care provider. Make sure you discuss any questions you  have with your health care provider. Document Released: 01/10/2001 Document Revised: 06/29/2017 Document Reviewed: 12/29/2015 Elsevier Patient Education  2020 [employer]  

## 2019-03-11 NOTE — Progress Notes (Addendum)
I was present with the medical student for this service. I personally verified the history of present illness, performed the physical exam, and made the plan for this encounter. I have verified the medical student's documentation and made modifications where appropriately. I have personally documented in my own words a brief history, physical, and plan below.     Patient with small umbilical hernia and pin point fascial defect in the epigastric area with small amount of preperitoneal fat coming through defect. This smaller defect only seen on CT after personal review.  Nausea unlikely associated with either defects. Abdominal pain likely associated. Have discussed repair with mesh with the patient and unlikely change in any nausea symptoms.  Discussed risk of repair including bleeding, infection, use of mesh, and injury to other organs, and recurrence of hernia.   Curlene Labrum, MD [facility] [address] [place], [place] [address] [phone] (office)    [facility] History and Physical  Reason for Referral: Ventral hernias Referring Physician: Dr. Elonda Husky   Chief Complaint    Umbilical Hernia       HPI: Emily Morales is a 28 y.o. woman referred to clinic for a complaint of "knot above her belly button" and ventral hernias visualized on abdominal CT two months ago. Patient reports onset of this knot at her belly button about 6 months ago. Patient describes associated nausea with eating, decreased appetite due to pain, sharp pain elicited with movement, and firmness at the site with standing over the six month course. Patient increased pain intensity and frequency requiring her to miss work for the first time yesterday. Patient reports treatment at home with heating pads with temporary relief. Patient denies associated fever, chills, vomiting, diarrhea, urinary or vaginal symptoms. No history of hernias.  She has continues to have  bowel movements.  G2P2, history of two vaginal births without complications. Patient has a history of acid reflux for which she takes Protonix and Mylanta; quality of this pain is different. Patient reports a history of constipation [with hard stools and straining] since her childhood; now on Trulance. Patient denies associated change in her bowel habits. Hematochezia or melena.    Past Medical History:  Diagnosis Date   Abnormal Pap smear    Anemia    Anxiety    Arthritis    Chronic back pain    Depression    Dysrhythmia    irregular heartrate   GERD (gastroesophageal reflux disease)    Headache(784.0)    Hx of chlamydia infection    Hypothyroidism    Ovarian cyst, right    PCOS (polycystic ovarian syndrome)    Perimenopausal    Uterine fibroid     Past Surgical History:  Procedure Laterality Date   BIOPSY  11/14/2016   Procedure: BIOPSY;  Surgeon: Danie Binder, MD;  [facility];  Service: Endoscopy;;  duodenal gastric   COLPOSCOPY     ESOPHAGOGASTRODUODENOSCOPY (EGD) WITH PROPOFOL N/A 11/14/2016   Dr. Oneida Alar: Mild gastritis, normal esophagus, biopsy showed inflammation of the small bowel, gastric biopsies with no H. pylori.   lipoma surgery     MASS EXCISION N/A 02/28/2017   Procedure: EXCISION LIPOMA SCALP 3CM;  Surgeon: Aviva Signs, MD;  Location: [facility];  Service: General;  Laterality: N/A;    Family History  Problem Relation Age of Onset   Diabetes Father    Hypertension Father    Other Son        reactive airway disease  Asthma Son    Heart murmur Son    Depression Mother    Anxiety disorder Mother    Cancer Maternal Grandmother        lung   Lung cancer Maternal Grandmother    Heart disease Paternal Grandmother    Hypertension Paternal Grandmother    Liver disease Paternal Grandfather        etoh related   Alcohol abuse Maternal Grandfather    Colon cancer Neg Hx     Social History   Tobacco Use    Smoking status: Former Smoker    Types: Cigarettes   Smokeless tobacco: Never Used  Substance Use Topics   Alcohol use: Yes    Comment: occ   Drug use: Yes    Types: Other-see comments    Comment: CBD drops    Medications: I have personally reviewed the medication list.  Allergies as of 03/11/2019      Reactions   Pollen Extract Cough   Latex Rash   Other Rash   Coconut causes a rash      Medication List       Accurate as of March 11, 2019 11:56 AM. If you have any questions, ask your nurse or doctor.        Ciprodex OTIC suspension Generic drug: ciprofloxacin-dexamethasone Place 4 drops into the right ear 2 (two) times daily. For 7 days   fexofenadine 180 MG tablet Commonly known as: Allegra Allergy Take 1 tablet (180 mg total) by mouth daily.   FLUoxetine 10 MG capsule Commonly known as: PROZAC Take 1 capsule (10 mg total) by mouth daily.   FLUoxetine 20 MG capsule Commonly known as: PROZAC 20 mg daily. Start after completing 10 mg daily for one week   megestrol 40 MG tablet Commonly known as: MEGACE 3 tablets a day for 5 days, 2 tablets a day for 5 days then 1 tablet daily   omeprazole 40 MG capsule Commonly known as: PRILOSEC Take 1 capsule (40 mg total) by mouth daily before lunch.   ondansetron 4 MG disintegrating tablet Commonly known as: Zofran ODT Take 1 tablet (4 mg total) by mouth every 8 (eight) hours as needed.   Plecanatide 3 MG Tabs Commonly known as: Trulance Take 3 mg by mouth daily.   QUEtiapine 50 MG tablet Commonly known as: SEROquel Take 0.5-1 tablets (25-50 mg total) by mouth at bedtime.   QUEtiapine 100 MG tablet Commonly known as: SEROquel Take 1 tablet (100 mg total) by mouth at bedtime.   SUMAtriptan 50 MG tablet Commonly known as: Imitrex Take 1 tab at onset of migraine. May repeat in 2 hours if headache persists or recurs.   vortioxetine HBr 5 MG Tabs tablet Commonly known as: Trintellix Take 1 tablet (5 mg total)  by mouth daily.        ROS:  Review of Systems  Constitutional: Positive for appetite change. Negative for chills and fever.  HENT: Negative for sore throat.   Respiratory: Negative for cough.   Cardiovascular: Negative for chest pain.  Gastrointestinal: Positive for abdominal pain and nausea. Negative for blood in stool, diarrhea and vomiting.    Blood pressure 115/80, pulse 95, temperature (!) 97.1 F (36.2 C), temperature source Tympanic, resp. rate 16, height 5\' 5"  (1.651 m), weight 219 lb (99.3 kg), SpO2 98 %. Physical Exam Vitals signs reviewed.  Constitutional:      Appearance: She is obese.  HENT:     Head: Normocephalic and atraumatic.  Nose: Nose normal.     Mouth/Throat:     Mouth: Mucous membranes are moist.  Eyes:     Extraocular Movements: Extraocular movements intact.     Pupils: Pupils are equal, round, and reactive to light.  Neck:     Musculoskeletal: Normal range of motion. No neck rigidity.  Cardiovascular:     Rate and Rhythm: Normal rate and regular rhythm.     Heart sounds: Normal heart sounds.  Pulmonary:     Effort: Pulmonary effort is normal.     Breath sounds: Normal breath sounds.  Abdominal:     General: Abdomen is protuberant. Bowel sounds are normal.     Palpations: Abdomen is soft.     Hernia: A hernia is present. Hernia is present in the umbilical area.     Comments: 1.5cm soft mass at center of umbilicus, bulges with bearing down, reducible, small palpable fascial defect   Musculoskeletal: Normal range of motion.        General: No swelling.  Skin:    General: Skin is warm and dry.  Neurological:     General: No focal deficit present.     Mental Status: She is alert and oriented to person, place, and time.  Psychiatric:        Mood and Affect: Mood normal.        Behavior: Behavior normal.        Thought Content: Thought content normal.        Judgment: Judgment normal.     Results: Personally reviewed CT scan, possible  pinpoint fascia defect above umbilical hernia with preperitoneal fat   IMPRESSION: 1. A very subtle small ventral hernia is identified approximately 2.8 cm cranial to the umbilicus in the midline. The 1.8 x 1.7 x 1.2 cm hernia sac contains only preperitoneal fat and is extremely subtle in the background of the surrounding subcutaneous fat. No discernible fascial defect . There is no edema or fluid in the hernia sac to suggest fat incarceration. 2. Small fat containing umbilical hernia.  Assessment & Plan:  Emily Morales is a 28 y.o. female with progressively painful knot at her belly button for approximately 6 months. Umbilical hernia and more subtle epigastric hernia at 3cm superiorly containing preperitoneal fat; no bowel involvement. On exam, the umbilical hernia is easily reducible; no indication of incarceration or strangulation. Patient has expressed interest in having surgical repair and wants to know what she can do prevent development of hernias. Dr. Constance Haw discussed risks of herniorrhaphy with mesh including but not limited to bleeding, infection, pain at the site repair with healing, recurrence, unsolved abdominal pain and nausea. Patient informed of risks for hernia development including obesity, weight gain, pregnancy, diabetes, smoking, and abdominal surgery.   All questions were answered to the satisfaction of the patient. After careful consideration, GENEIVE SANDSTROM has decided to pursue herniorrhaphy at earliest available date.     Mercy Moore 03/11/2019, 12:39 PM

## 2019-03-12 ENCOUNTER — Telehealth (HOSPITAL_COMMUNITY): Payer: Self-pay | Admitting: Psychiatry

## 2019-03-12 ENCOUNTER — Ambulatory Visit (HOSPITAL_COMMUNITY): Payer: No Typology Code available for payment source | Admitting: Psychiatry

## 2019-03-12 NOTE — Telephone Encounter (Signed)
Sent link for video visit through [employer]. Patient did not sign in. Called the patient  twice for appointment scheduled today. The patient did not answer the phone. Left voice message to contact the office.  

## 2019-03-17 DIAGNOSIS — K439 Ventral hernia without obstruction or gangrene: Secondary | ICD-10-CM | POA: Insufficient documentation

## 2019-03-17 NOTE — H&P (Signed)
I was present with the medical student for this service. I personally verified the history of present illness, performed the physical exam, and made the plan for this encounter. I have verified the medical student's documentation and made modifications where appropriately. I have personally documented in my own words a brief history, physical, and plan below.     Patient with small umbilical hernia and pin point fascial defect in the epigastric area with small amount of preperitoneal fat coming through defect. This smaller defect only seen on CT after personal review.  Nausea unlikely associated with either defects. Abdominal pain likely associated. Have discussed repair with mesh with the patient and unlikely change in any nausea symptoms.  Discussed risk of repair including bleeding, infection, use of mesh, and injury to other organs, and recurrence of hernia.   Curlene Labrum, MD [facility] [address] [place], [place] [address] [phone] (office)    [facility] History and Physical  Reason for Referral: Ventral hernias Referring Physician: Dr. Elonda Husky      Chief Complaint    Umbilical Hernia       HPI: Emily Morales is a 28 y.o. woman referred to clinic for a complaint of "knot above her belly button" and ventral hernias visualized on abdominal CT two months ago. Patient reports onset of this knot at her belly button about 6 months ago. Patient describes associated nausea with eating, decreased appetite due to pain, sharp pain elicited with movement, and firmness at the site with standing over the six month course. Patient increased pain intensity and frequency requiring her to miss work for the first time yesterday. Patient reports treatment at home with heating pads with temporary relief. Patient denies associated fever, chills, vomiting, diarrhea, urinary or vaginal symptoms. No history of hernias.  She has  continues to have bowel movements.  G2P2, history of two vaginal births without complications. Patient has a history of acid reflux for which she takes Protonix and Mylanta; quality of this pain is different. Patient reports a history of constipation [with hard stools and straining] since her childhood; now on Trulance. Patient denies associated change in her bowel habits. Hematochezia or melena.        Past Medical History:  Diagnosis Date   Abnormal Pap smear    Anemia    Anxiety    Arthritis    Chronic back pain    Depression    Dysrhythmia    irregular heartrate   GERD (gastroesophageal reflux disease)    Headache(784.0)    Hx of chlamydia infection    Hypothyroidism    Ovarian cyst, right    PCOS (polycystic ovarian syndrome)    Perimenopausal    Uterine fibroid          Past Surgical History:  Procedure Laterality Date   BIOPSY  11/14/2016   Procedure: BIOPSY;  Surgeon: Danie Binder, MD;  [facility];  Service: Endoscopy;;  duodenal gastric   COLPOSCOPY     ESOPHAGOGASTRODUODENOSCOPY (EGD) WITH PROPOFOL N/A 11/14/2016   Dr. Oneida Alar: Mild gastritis, normal esophagus, biopsy showed inflammation of the small bowel, gastric biopsies with no H. pylori.   lipoma surgery     MASS EXCISION N/A 02/28/2017   Procedure: EXCISION LIPOMA SCALP 3CM;  Surgeon: Aviva Signs, MD;  Location: [facility];  Service: General;  Laterality: N/A;         Family History  Problem Relation Age of Onset   Diabetes Father  Hypertension Father    Other Son        reactive airway disease   Asthma Son    Heart murmur Son    Depression Mother    Anxiety disorder Mother    Cancer Maternal Grandmother        lung   Lung cancer Maternal Grandmother    Heart disease Paternal Grandmother    Hypertension Paternal Grandmother    Liver disease Paternal Grandfather        etoh related   Alcohol abuse Maternal  Grandfather    Colon cancer Neg Hx     Social History        Tobacco Use   Smoking status: Former Smoker    Types: Cigarettes   Smokeless tobacco: Never Used  Substance Use Topics   Alcohol use: Yes    Comment: occ   Drug use: Yes    Types: Other-see comments    Comment: CBD drops    Medications: I have personally reviewed the medication list.       Allergies as of 03/11/2019      Reactions   Pollen Extract Cough   Latex Rash   Other Rash   Coconut causes a rash         Medication List       Accurate as of March 11, 2019 11:56 AM. If you have any questions, ask your nurse or doctor.        Ciprodex OTIC suspension Generic drug: ciprofloxacin-dexamethasone Place 4 drops into the right ear 2 (two) times daily. For 7 days   fexofenadine 180 MG tablet Commonly known as: Allegra Allergy Take 1 tablet (180 mg total) by mouth daily.   FLUoxetine 10 MG capsule Commonly known as: PROZAC Take 1 capsule (10 mg total) by mouth daily.   FLUoxetine 20 MG capsule Commonly known as: PROZAC 20 mg daily. Start after completing 10 mg daily for one week   megestrol 40 MG tablet Commonly known as: MEGACE 3 tablets a day for 5 days, 2 tablets a day for 5 days then 1 tablet daily   omeprazole 40 MG capsule Commonly known as: PRILOSEC Take 1 capsule (40 mg total) by mouth daily before lunch.   ondansetron 4 MG disintegrating tablet Commonly known as: Zofran ODT Take 1 tablet (4 mg total) by mouth every 8 (eight) hours as needed.   Plecanatide 3 MG Tabs Commonly known as: Trulance Take 3 mg by mouth daily.   QUEtiapine 50 MG tablet Commonly known as: SEROquel Take 0.5-1 tablets (25-50 mg total) by mouth at bedtime.   QUEtiapine 100 MG tablet Commonly known as: SEROquel Take 1 tablet (100 mg total) by mouth at bedtime.   SUMAtriptan 50 MG tablet Commonly known as: Imitrex Take 1 tab at onset of migraine. May repeat in 2 hours  if headache persists or recurs.   vortioxetine HBr 5 MG Tabs tablet Commonly known as: Trintellix Take 1 tablet (5 mg total) by mouth daily.        ROS:  Review of Systems  Constitutional: Positive for appetite change. Negative for chills and fever.  HENT: Negative for sore throat.   Respiratory: Negative for cough.   Cardiovascular: Negative for chest pain.  Gastrointestinal: Positive for abdominal pain and nausea. Negative for blood in stool, diarrhea and vomiting.    Blood pressure 115/80, pulse 95, temperature (!) 97.1 F (36.2 C), temperature source Tympanic, resp. rate 16, height 5\' 5"  (1.651 m), weight 219 lb (99.3 kg),  SpO2 98 %. Physical Exam Vitals signs reviewed.  Constitutional:      Appearance: She is obese.  HENT:     Head: Normocephalic and atraumatic.     Nose: Nose normal.     Mouth/Throat:     Mouth: Mucous membranes are moist.  Eyes:     Extraocular Movements: Extraocular movements intact.     Pupils: Pupils are equal, round, and reactive to light.  Neck:     Musculoskeletal: Normal range of motion. No neck rigidity.  Cardiovascular:     Rate and Rhythm: Normal rate and regular rhythm.     Heart sounds: Normal heart sounds.  Pulmonary:     Effort: Pulmonary effort is normal.     Breath sounds: Normal breath sounds.  Abdominal:     General: Abdomen is protuberant. Bowel sounds are normal.     Palpations: Abdomen is soft.     Hernia: A hernia is present. Hernia is present in the umbilical area.     Comments: 1.5cm soft mass at center of umbilicus, bulges with bearing down, reducible, small palpable fascial defect   Musculoskeletal: Normal range of motion.        General: No swelling.  Skin:    General: Skin is warm and dry.  Neurological:     General: No focal deficit present.     Mental Status: She is alert and oriented to person, place, and time.  Psychiatric:        Mood and Affect: Mood normal.        Behavior: Behavior normal.         Thought Content: Thought content normal.        Judgment: Judgment normal.     Results: Personally reviewed CT scan, possible pinpoint fascia defect above umbilical hernia with preperitoneal fat   IMPRESSION: 1. A very subtle small ventral hernia is identified approximately 2.8 cm cranial to the umbilicus in the midline. The 1.8 x 1.7 x 1.2 cm hernia sac contains only preperitoneal fat and is extremely subtle in the background of the surrounding subcutaneous fat. No discernible fascial defect . There is no edema or fluid in the hernia sac to suggest fat incarceration. 2. Small fat containing umbilical hernia.  Assessment & Plan:  Emily Morales is a 29 y.o. female with progressively painful knot at her belly button for approximately 6 months. Umbilical hernia and more subtle epigastric hernia at 3cm superiorly containing preperitoneal fat; no bowel involvement. On exam, the umbilical hernia is easily reducible; no indication of incarceration or strangulation. Patient has expressed interest in having surgical repair and wants to know what she can do prevent development of hernias. Dr. Constance Haw discussed risks of herniorrhaphy with mesh including but not limited to bleeding, infection, pain at the site repair with healing, recurrence, unsolved abdominal pain and nausea. Patient informed of risks for hernia development including obesity, weight gain, pregnancy, diabetes, smoking, and abdominal surgery.   All questions were answered to the satisfaction of the patient. After careful consideration, Emily Morales has decided to pursue herniorrhaphy at earliest available date.     Mercy Moore 03/11/2019, 12:39 PM

## 2019-03-25 ENCOUNTER — Telehealth: Payer: No Typology Code available for payment source | Admitting: Physician Assistant

## 2019-03-25 DIAGNOSIS — N898 Other specified noninflammatory disorders of vagina: Secondary | ICD-10-CM

## 2019-03-25 MED ORDER — FLUCONAZOLE 150 MG PO TABS: 150.0000 mg | ORAL_TABLET | Freq: Once | ORAL | 0 refills | Status: AC

## 2019-03-25 NOTE — Progress Notes (Signed)
We are sorry that you are not feeling well. Here is how we plan to help! Based on what you shared with me it looks like you: May have a yeast vaginosis  Vaginosis is an inflammation of the vagina that can result in discharge, itching and pain. The cause is usually a change in the normal balance of vaginal bacteria or an infection. Vaginosis can also result from reduced estrogen levels after menopause.  The most common causes of vaginosis are:   Bacterial vaginosis which results from an overgrowth of one on several organisms that are normally present in your vagina.   Yeast infections which are caused by a naturally occurring fungus called candida.   Vaginal atrophy (atrophic vaginosis) which results from the thinning of the vagina from reduced estrogen levels after menopause.   Trichomoniasis which is caused by a parasite and is commonly transmitted by sexual intercourse.  Factors that increase your risk of developing vaginosis include: . Medications, such as antibiotics and steroids . Uncontrolled diabetes . Use of hygiene products such as bubble bath, vaginal spray or vaginal deodorant . Douching . Wearing damp or tight-fitting clothing . Using an intrauterine device (IUD) for birth control . Hormonal changes, such as those associated with pregnancy, birth control pills or menopause . Sexual activity . Having a sexually transmitted infection  Your treatment plan is A single Diflucan (fluconazole) 150mg tablet once.  I have electronically sent this prescription into the pharmacy that you have chosen.  Be sure to take all of the medication as directed. Stop taking any medication if you develop a rash, tongue swelling or shortness of breath. Mothers who are breast feeding should consider pumping and discarding their breast milk while on these antibiotics. However, there is no consensus that infant exposure at these doses would be harmful.  Remember that medication creams can weaken latex  condoms. .   HOME CARE:  Good hygiene may prevent some types of vaginosis from recurring and may relieve some symptoms:  . Avoid baths, hot tubs and whirlpool spas. Rinse soap from your outer genital area after a shower, and dry the area well to prevent irritation. Don't use scented or harsh soaps, such as those with deodorant or antibacterial action. . Avoid irritants. These include scented tampons and pads. . Wipe from front to back after using the toilet. Doing so avoids spreading fecal bacteria to your vagina.  Other things that may help prevent vaginosis include:  . Don't douche. Your vagina doesn't require cleansing other than normal bathing. Repetitive douching disrupts the normal organisms that reside in the vagina and can actually increase your risk of vaginal infection. Douching won't clear up a vaginal infection. . Use a latex condom. Both female and female latex condoms may help you avoid infections spread by sexual contact. . Wear cotton underwear. Also wear pantyhose with a cotton crotch. If you feel comfortable without it, skip wearing underwear to bed. Yeast thrives in moist environments Your symptoms should improve in the next day or two.  GET HELP RIGHT AWAY IF:  . You have pain in your lower abdomen ( pelvic area or over your ovaries) . You develop nausea or vomiting . You develop a fever . Your discharge changes or worsens . You have persistent pain with intercourse . You develop shortness of breath, a rapid pulse, or you faint.  These symptoms could be signs of problems or infections that need to be evaluated by a medical provider now.  MAKE SURE YOU      Understand these instructions.  Will watch your condition.  Will get help right away if you are not doing well or get worse.  Your e-visit answers were reviewed by a board certified advanced clinical practitioner to complete your personal care plan. Depending upon the condition, your plan could have included  both over the counter or prescription medications. Please review your pharmacy choice to make sure that you have choses a pharmacy that is open for you to pick up any needed prescription, Your safety is important to us. If you have drug allergies check your prescription carefully.   You can use MyChart to ask questions about today's visit, request a non-urgent call back, or ask for a work or school excuse for 24 hours related to this e-Visit. If it has been greater than 24 hours you will need to follow up with your provider, or enter a new e-Visit to address those concerns. You will get a MyChart message within the next two days asking about your experience. I hope that your e-visit has been valuable and will speed your recovery.  I have spent 5 minutes in review of e-visit questionnaire, review and updating patient chart, medical decision making and response to patient.    Kaisa Wofford, PA-C   

## 2019-03-28 NOTE — Patient Instructions (Signed)
Emily Morales  03/28/2019     @PREFPERIOPPHARMACY @   Your procedure is scheduled on  04/04/2019.  Report to [facility] at  615   A.M.  Call this number if you have problems the morning of surgery:  [phone]   Remember:  Do not eat or drink after midnight.                        Take these medicines the morning of surgery with A SIP OF WATER  Allegra, imitrex( if needed).    Do not wear jewelry, make-up or nail polish.  Do not wear lotions, powders, or perfumes. Please wear deodorant and brush your teeth.  Do not shave 48 hours prior to surgery.  Men may shave face and neck.  Do not bring valuables to the hospital.  [facility] is not responsible for any belongings or valuables.  Contacts, dentures or bridgework may not be worn into surgery.  Leave your suitcase in the car.  After surgery it may be brought to your room.  For patients admitted to the hospital, discharge time will be determined by your treatment team.  Patients discharged the day of surgery will not be allowed to drive home.   Name and phone number of your driver:   family Special instructions:  None  Please read over the following fact sheets that you were given. Anesthesia Post-op Instructions and Care and Recovery After Surgery       Open Hernia Repair, Adult, Care After These instructions give you information about caring for yourself after your procedure. Your doctor may also give you more specific instructions. If you have problems or questions, contact your doctor. Follow these instructions at home: Surgical cut (incision) care   Follow instructions from your doctor about how to take care of your surgical cut area. Make sure you: ? Wash your hands with soap and water before you change your bandage (dressing). If you cannot use soap and water, use hand sanitizer. ? Change your bandage as told by your doctor. ? Leave stitches (sutures), skin glue, or skin tape (adhesive) strips  in place. They may need to stay in place for 2 weeks or longer. If tape strips get loose and curl up, you may trim the loose edges. Do not remove tape strips completely unless your doctor says it is okay.  Check your surgical cut every day for signs of infection. Check for: ? More redness, swelling, or pain. ? More fluid or blood. ? Warmth. ? Pus or a bad smell. Activity  Do not drive or use heavy machinery while taking prescription pain medicine. Do not drive until your doctor says it is okay.  Until your doctor says it is okay: ? Do not lift anything that is heavier than 10 lb (4.5 kg). ? Do not play contact sports.  Return to your normal activities as told by your doctor. Ask your doctor what activities are safe. General instructions  To prevent or treat having a hard time pooping (constipation) while you are taking prescription pain medicine, your doctor may recommend that you: ? Drink enough fluid to keep your pee (urine) clear or pale yellow. ? Take over-the-counter or prescription medicines. ? Eat foods that are high in fiber, such as fresh fruits and vegetables, whole grains, and beans. ? Limit foods that are high in fat and processed sugars, such as fried and sweet foods.  Take over-the-counter  and prescription medicines only as told by your doctor.  Do not take baths, swim, or use a hot tub until your doctor says it is okay.  Keep all follow-up visits as told by your doctor. This is important. Contact a doctor if:  You develop a rash.  You have more redness, swelling, or pain around your surgical cut.  You have more fluid or blood coming from your surgical cut.  Your surgical cut feels warm to the touch.  You have pus or a bad smell coming from your surgical cut.  You have a fever or chills.  You have blood in your poop (stool).  You have not pooped in 2-3 days.  Medicine does not help your pain. Get help right away if:  You have chest pain or you are short  of breath.  You feel light-headed.  You feel weak and dizzy (feel faint).  You have very bad pain.  You throw up (vomit) and your pain is worse. This information is not intended to replace advice given to you by your health care provider. Make sure you discuss any questions you have with your health care provider. Document Released: 08/07/2014 Document Revised: 11/08/2018 Document Reviewed: 12/29/2015 Elsevier Patient Education  2020 [name], Adult, Care After This sheet gives you information about how to care for yourself after your procedure. Your health care provider may also give you more specific instructions. If you have problems or questions, contact your health care provider. What can I expect after the procedure? After the procedure, the following side effects are common:  Pain or discomfort at the IV site.  Nausea.  Vomiting.  Sore throat.  Trouble concentrating.  Feeling cold or chills.  Weak or tired.  Sleepiness and fatigue.  Soreness and body aches. These side effects can affect parts of the body that were not involved in surgery. Follow these instructions at home:  For at least 24 hours after the procedure:  Have a responsible adult stay with you. It is important to have someone help care for you until you are awake and alert.  Rest as needed.  Do not: ? Participate in activities in which you could fall or become injured. ? Drive. ? Use heavy machinery. ? Drink alcohol. ? Take sleeping pills or medicines that cause drowsiness. ? Make important decisions or sign legal documents. ? Take care of children on your own. Eating and drinking  Follow any instructions from your health care provider about eating or drinking restrictions.  When you feel hungry, start by eating small amounts of foods that are soft and easy to digest (bland), such as toast. Gradually return to your regular diet.  Drink enough fluid to keep your  urine pale yellow.  If you vomit, rehydrate by drinking water, juice, or clear broth. General instructions  If you have sleep apnea, surgery and certain medicines can increase your risk for breathing problems. Follow instructions from your health care provider about wearing your sleep device: ? Anytime you are sleeping, including during daytime naps. ? While taking prescription pain medicines, sleeping medicines, or medicines that make you drowsy.  Return to your normal activities as told by your health care provider. Ask your health care provider what activities are safe for you.  Take over-the-counter and prescription medicines only as told by your health care provider.  If you smoke, do not smoke without supervision.  Keep all follow-up visits as told by your health care provider. This is important.  Contact a health care provider if:  You have nausea or vomiting that does not get better with medicine.  You cannot eat or drink without vomiting.  You have pain that does not get better with medicine.  You are unable to pass urine.  You develop a skin rash.  You have a fever.  You have redness around your IV site that gets worse. Get help right away if:  You have difficulty breathing.  You have chest pain.  You have blood in your urine or stool, or you vomit blood. Summary  After the procedure, it is common to have a sore throat or nausea. It is also common to feel tired.  Have a responsible adult stay with you for the first 24 hours after general anesthesia. It is important to have someone help care for you until you are awake and alert.  When you feel hungry, start by eating small amounts of foods that are soft and easy to digest (bland), such as toast. Gradually return to your regular diet.  Drink enough fluid to keep your urine pale yellow.  Return to your normal activities as told by your health care provider. Ask your health care provider what activities are safe  for you. This information is not intended to replace advice given to you by your health care provider. Make sure you discuss any questions you have with your health care provider. Document Released: 10/23/2000 Document Revised: 07/20/2017 Document Reviewed: 03/02/2017 Elsevier Patient Education  2020 [employer]. How to Use Chlorhexidine for Bathing Chlorhexidine gluconate (CHG) is a germ-killing (antiseptic) solution that is used to clean the skin. It can get rid of the bacteria that normally live on the skin and can keep them away for about 24 hours. To clean your skin with CHG, you may be given:  A CHG solution to use in the shower or as part of a sponge bath.  A prepackaged cloth that contains CHG. Cleaning your skin with CHG may help lower the risk for infection:  While you are staying in the intensive care unit of the hospital.  If you have a vascular access, such as a central line, to provide short-term or long-term access to your veins.  If you have a catheter to drain urine from your bladder.  If you are on a ventilator. A ventilator is a machine that helps you breathe by moving air in and out of your lungs.  After surgery. What are the risks? Risks of using CHG include:  A skin reaction.  Hearing loss, if CHG gets in your ears.  Eye injury, if CHG gets in your eyes and is not rinsed out.  The CHG product catching fire. Make sure that you avoid smoking and flames after applying CHG to your skin. Do not use CHG:  If you have a chlorhexidine allergy or have previously reacted to chlorhexidine.  On babies younger than 67 months of age. How to use CHG solution  Use CHG only as told by your health care provider, and follow the instructions on the label.  Use the full amount of CHG as directed. Usually, this is one bottle. During a shower Follow these steps when using CHG solution during a shower (unless your health care provider gives you different instructions): 1.  Start the shower. 2. Use your normal soap and shampoo to wash your face and hair. 3. Turn off the shower or move out of the shower stream. 4. Pour the CHG onto a clean washcloth. Do  not use any type of brush or rough-edged sponge. 5. Starting at your neck, lather your body down to your toes. Make sure you follow these instructions: ? If you will be having surgery, pay special attention to the part of your body where you will be having surgery. Scrub this area for at least 1 minute. ? Do not use CHG on your head or face. If the solution gets into your ears or eyes, rinse them well with water. ? Avoid your genital area. ? Avoid any areas of skin that have broken skin, cuts, or scrapes. ? Scrub your back and under your arms. Make sure to wash skin folds. 6. Let the lather sit on your skin for 1-2 minutes or as long as told by your health care provider. 7. Thoroughly rinse your entire body in the shower. Make sure that all body creases and crevices are rinsed well. 8. Dry off with a clean towel. Do not put any substances on your body afterward-such as powder, lotion, or perfume-unless you are told to do so by your health care provider. Only use lotions that are recommended by the manufacturer. 9. Put on clean clothes or pajamas. 10. If it is the night before your surgery, sleep in clean sheets.  During a sponge bath Follow these steps when using CHG solution during a sponge bath (unless your health care provider gives you different instructions): 1. Use your normal soap and shampoo to wash your face and hair. 2. Pour the CHG onto a clean washcloth. 3. Starting at your neck, lather your body down to your toes. Make sure you follow these instructions: ? If you will be having surgery, pay special attention to the part of your body where you will be having surgery. Scrub this area for at least 1 minute. ? Do not use CHG on your head or face. If the solution gets into your ears or eyes, rinse them well  with water. ? Avoid your genital area. ? Avoid any areas of skin that have broken skin, cuts, or scrapes. ? Scrub your back and under your arms. Make sure to wash skin folds. 4. Let the lather sit on your skin for 1-2 minutes or as long as told by your health care provider. 5. Using a different clean, wet washcloth, thoroughly rinse your entire body. Make sure that all body creases and crevices are rinsed well. 6. Dry off with a clean towel. Do not put any substances on your body afterward-such as powder, lotion, or perfume-unless you are told to do so by your health care provider. Only use lotions that are recommended by the manufacturer. 7. Put on clean clothes or pajamas. 8. If it is the night before your surgery, sleep in clean sheets. How to use CHG prepackaged cloths  Only use CHG cloths as told by your health care provider, and follow the instructions on the label.  Use the CHG cloth on clean, dry skin.  Do not use the CHG cloth on your head or face unless your health care provider tells you to.  When washing with the CHG cloth: ? Avoid your genital area. ? Avoid any areas of skin that have broken skin, cuts, or scrapes. Before surgery Follow these steps when using a CHG cloth to clean before surgery (unless your health care provider gives you different instructions): 1. Using the CHG cloth, vigorously scrub the part of your body where you will be having surgery. Scrub using a back-and-forth motion for 3 minutes. The  area on your body should be completely wet with CHG when you are done scrubbing. 2. Do not rinse. Discard the cloth and let the area air-dry. Do not put any substances on the area afterward, such as powder, lotion, or perfume. 3. Put on clean clothes or pajamas. 4. If it is the night before your surgery, sleep in clean sheets.  For general bathing Follow these steps when using CHG cloths for general bathing (unless your health care provider gives you different  instructions). 1. Use a separate CHG cloth for each area of your body. Make sure you wash between any folds of skin and between your fingers and toes. Wash your body in the following order, switching to a new cloth after each step: ? The front of your neck, shoulders, and chest. ? Both of your arms, under your arms, and your hands. ? Your stomach and groin area, avoiding the genitals. ? Your right leg and foot. ? Your left leg and foot. ? The back of your neck, your back, and your buttocks. 2. Do not rinse. Discard the cloth and let the area air-dry. Do not put any substances on your body afterward-such as powder, lotion, or perfume-unless you are told to do so by your health care provider. Only use lotions that are recommended by the manufacturer. 3. Put on clean clothes or pajamas. Contact a health care provider if:  Your skin gets irritated after scrubbing.  You have questions about using your solution or cloth. Get help right away if:  Your eyes become very red or swollen.  Your eyes itch badly.  Your skin itches badly and is red or swollen.  Your hearing changes.  You have trouble seeing.  You have swelling or tingling in your mouth or throat.  You have trouble breathing.  You swallow any chlorhexidine. Summary  Chlorhexidine gluconate (CHG) is a germ-killing (antiseptic) solution that is used to clean the skin. Cleaning your skin with CHG may help to lower your risk for infection.  You may be given CHG to use for bathing. It may be in a bottle or in a prepackaged cloth to use on your skin. Carefully follow your health care provider's instructions and the instructions on the product label.  Do not use CHG if you have a chlorhexidine allergy.  Contact your health care provider if your skin gets irritated after scrubbing. This information is not intended to replace advice given to you by your health care provider. Make sure you discuss any questions you have with your  health care provider. Document Released: 04/10/2012 Document Revised: 10/03/2018 Document Reviewed: 06/14/2017 Elsevier Patient Education  2020 [employer].

## 2019-03-31 ENCOUNTER — Other Ambulatory Visit: Payer: Self-pay | Admitting: Family Medicine

## 2019-04-01 ENCOUNTER — Other Ambulatory Visit (HOSPITAL_COMMUNITY)
Admission: RE | Admit: 2019-04-01 | Discharge: 2019-04-01 | Disposition: A | Discharge status: Home / Self Care | Payer: No Typology Code available for payment source | Source: Ambulatory Visit | Attending: General Surgery | Admitting: General Surgery

## 2019-04-01 ENCOUNTER — Other Ambulatory Visit: Payer: Self-pay

## 2019-04-01 ENCOUNTER — Encounter (HOSPITAL_COMMUNITY)
Admission: RE | Admit: 2019-04-01 | Discharge: 2019-04-01 | Disposition: A | Discharge status: Home / Self Care | Payer: No Typology Code available for payment source | Source: Ambulatory Visit | Attending: General Surgery | Admitting: General Surgery

## 2019-04-01 DIAGNOSIS — Z87891 Personal history of nicotine dependence: Secondary | ICD-10-CM | POA: Diagnosis not present

## 2019-04-01 DIAGNOSIS — K219 Gastro-esophageal reflux disease without esophagitis: Secondary | ICD-10-CM | POA: Diagnosis not present

## 2019-04-01 DIAGNOSIS — F329 Major depressive disorder, single episode, unspecified: Secondary | ICD-10-CM | POA: Diagnosis not present

## 2019-04-01 DIAGNOSIS — E039 Hypothyroidism, unspecified: Secondary | ICD-10-CM | POA: Diagnosis not present

## 2019-04-01 DIAGNOSIS — K429 Umbilical hernia without obstruction or gangrene: Secondary | ICD-10-CM | POA: Diagnosis not present

## 2019-04-01 DIAGNOSIS — K439 Ventral hernia without obstruction or gangrene: Secondary | ICD-10-CM | POA: Diagnosis not present

## 2019-04-01 DIAGNOSIS — Z79899 Other long term (current) drug therapy: Secondary | ICD-10-CM | POA: Diagnosis not present

## 2019-04-01 DIAGNOSIS — Z79818 Long term (current) use of other agents affecting estrogen receptors and estrogen levels: Secondary | ICD-10-CM | POA: Diagnosis not present

## 2019-04-01 LAB — CBC
HCT: 34.3 % — ABNORMAL LOW (ref 36.0–46.0)
Hemoglobin: 10.7 g/dL — ABNORMAL LOW (ref 12.0–15.0)
MCH: 26.4 pg (ref 26.0–34.0)
MCHC: 31.2 g/dL (ref 30.0–36.0)
MCV: 84.5 fL (ref 80.0–100.0)
Platelets: 279 10*3/uL (ref 150–400)
RBC: 4.06 MIL/uL (ref 3.87–5.11)
RDW: 15.8 % — ABNORMAL HIGH (ref 11.5–15.5)
WBC: 5.6 10*3/uL (ref 4.0–10.5)
nRBC: 0 % (ref 0.0–0.2)

## 2019-04-01 LAB — SARS CORONAVIRUS 2 (TAT 6-24 HRS): SARS Coronavirus 2: NEGATIVE

## 2019-04-01 LAB — HCG, QUANTITATIVE, PREGNANCY: hCG, Beta Chain, Quant, S: 1 m[IU]/mL (ref <5)

## 2019-04-02 ENCOUNTER — Ambulatory Visit: Payer: No Typology Code available for payment source | Admitting: Gastroenterology

## 2019-04-02 ENCOUNTER — Encounter: Payer: Self-pay | Admitting: Gastroenterology

## 2019-04-02 ENCOUNTER — Telehealth: Payer: Self-pay | Admitting: Gastroenterology

## 2019-04-02 MED ORDER — QUETIAPINE FUMARATE 50 MG PO TABS: 25.0000 mg | ORAL_TABLET | Freq: Every day | ORAL | 0 refills | Status: DC

## 2019-04-02 MED ORDER — SUMATRIPTAN SUCCINATE 50 MG PO TABS: ORAL_TABLET | ORAL | 0 refills | Status: DC

## 2019-04-02 NOTE — Telephone Encounter (Signed)
PATIENT WAS A NO SHOW AND LETTER SENT  °

## 2019-04-04 ENCOUNTER — Ambulatory Visit (HOSPITAL_COMMUNITY)
Admission: RE | Admit: 2019-04-04 | Discharge: 2019-04-04 | Disposition: A | Discharge status: Home / Self Care | Payer: No Typology Code available for payment source | Attending: General Surgery | Admitting: General Surgery

## 2019-04-04 ENCOUNTER — Other Ambulatory Visit: Payer: Self-pay

## 2019-04-04 ENCOUNTER — Encounter (HOSPITAL_COMMUNITY)
Admission: RE | Disposition: A | Discharge status: Home / Self Care | Payer: Self-pay | Source: Home / Self Care | Attending: General Surgery

## 2019-04-04 ENCOUNTER — Encounter (HOSPITAL_COMMUNITY): Payer: Self-pay | Admitting: Emergency Medicine

## 2019-04-04 ENCOUNTER — Ambulatory Visit (HOSPITAL_COMMUNITY): Payer: No Typology Code available for payment source | Admitting: Anesthesiology

## 2019-04-04 DIAGNOSIS — K219 Gastro-esophageal reflux disease without esophagitis: Secondary | ICD-10-CM | POA: Insufficient documentation

## 2019-04-04 DIAGNOSIS — Z79899 Other long term (current) drug therapy: Secondary | ICD-10-CM | POA: Insufficient documentation

## 2019-04-04 DIAGNOSIS — K429 Umbilical hernia without obstruction or gangrene: Secondary | ICD-10-CM | POA: Diagnosis present

## 2019-04-04 DIAGNOSIS — K439 Ventral hernia without obstruction or gangrene: Secondary | ICD-10-CM | POA: Diagnosis not present

## 2019-04-04 DIAGNOSIS — Z79818 Long term (current) use of other agents affecting estrogen receptors and estrogen levels: Secondary | ICD-10-CM | POA: Insufficient documentation

## 2019-04-04 DIAGNOSIS — E039 Hypothyroidism, unspecified: Secondary | ICD-10-CM | POA: Insufficient documentation

## 2019-04-04 DIAGNOSIS — Z87891 Personal history of nicotine dependence: Secondary | ICD-10-CM | POA: Insufficient documentation

## 2019-04-04 DIAGNOSIS — F329 Major depressive disorder, single episode, unspecified: Secondary | ICD-10-CM | POA: Insufficient documentation

## 2019-04-04 HISTORY — PX: UMBILICAL HERNIA REPAIR: SHX196

## 2019-04-04 SURGERY — REPAIR, HERNIA, UMBILICAL, ADULT
Anesthesia: General | Site: Abdomen

## 2019-04-04 MED ORDER — HYDROCODONE-ACETAMINOPHEN 7.5-325 MG PO TABS
1.0000 | ORAL_TABLET | Freq: Once | ORAL | Status: AC | PRN
  Administered 2019-04-04: 1 via ORAL
  Filled 2019-04-04: qty 1

## 2019-04-04 MED ORDER — LACTATED RINGERS IV SOLN
INTRAVENOUS | Status: DC | PRN
  Administered 2019-04-04: 07:00:00 via INTRAVENOUS

## 2019-04-04 MED ORDER — HYDROMORPHONE HCL 1 MG/ML IJ SOLN
0.2500 mg | INTRAMUSCULAR | Status: DC | PRN
  Administered 2019-04-04 (×2): 0.5 mg via INTRAVENOUS
  Filled 2019-04-04 (×2): qty 0.5

## 2019-04-04 MED ORDER — PROPOFOL 10 MG/ML IV BOLUS
INTRAVENOUS | Status: DC | PRN
  Administered 2019-04-04: 200 mg via INTRAVENOUS

## 2019-04-04 MED ORDER — FENTANYL CITRATE (PF) 250 MCG/5ML IJ SOLN
INTRAMUSCULAR | Status: AC
  Filled 2019-04-04: qty 5

## 2019-04-04 MED ORDER — OXYCODONE HCL 5 MG PO TABS: 5.0000 mg | ORAL_TABLET | ORAL | 0 refills | Status: DC | PRN

## 2019-04-04 MED ORDER — SUGAMMADEX SODIUM 200 MG/2ML IV SOLN
INTRAVENOUS | Status: DC | PRN
  Administered 2019-04-04: 400 mg via INTRAVENOUS

## 2019-04-04 MED ORDER — CEFAZOLIN SODIUM-DEXTROSE 2-4 GM/100ML-% IV SOLN
2.0000 g | INTRAVENOUS | Status: AC
  Administered 2019-04-04: 2 g via INTRAVENOUS
  Filled 2019-04-04: qty 100

## 2019-04-04 MED ORDER — DEXAMETHASONE SODIUM PHOSPHATE 10 MG/ML IJ SOLN
INTRAMUSCULAR | Status: AC
  Filled 2019-04-04: qty 1

## 2019-04-04 MED ORDER — BUPIVACAINE LIPOSOME 1.3 % IJ SUSP
INTRAMUSCULAR | Status: DC | PRN
  Administered 2019-04-04: 20 mL

## 2019-04-04 MED ORDER — LACTATED RINGERS IV SOLN: INTRAVENOUS | Status: DC

## 2019-04-04 MED ORDER — FENTANYL CITRATE (PF) 100 MCG/2ML IJ SOLN
INTRAMUSCULAR | Status: DC | PRN
  Administered 2019-04-04: 100 ug via INTRAVENOUS
  Administered 2019-04-04: 150 ug via INTRAVENOUS

## 2019-04-04 MED ORDER — CHLORHEXIDINE GLUCONATE CLOTH 2 % EX PADS: 6.0000 | MEDICATED_PAD | Freq: Once | CUTANEOUS | Status: DC

## 2019-04-04 MED ORDER — ROCURONIUM BROMIDE 100 MG/10ML IV SOLN
INTRAVENOUS | Status: DC | PRN
  Administered 2019-04-04: 50 mg via INTRAVENOUS

## 2019-04-04 MED ORDER — SCOPOLAMINE 1 MG/3DAYS TD PT72
MEDICATED_PATCH | TRANSDERMAL | Status: AC
  Filled 2019-04-04: qty 1

## 2019-04-04 MED ORDER — MIDAZOLAM HCL 2 MG/2ML IJ SOLN
INTRAMUSCULAR | Status: AC
  Filled 2019-04-04: qty 2

## 2019-04-04 MED ORDER — LIDOCAINE 2% (20 MG/ML) 5 ML SYRINGE
INTRAMUSCULAR | Status: AC
  Filled 2019-04-04: qty 5

## 2019-04-04 MED ORDER — SODIUM CHLORIDE 0.9 % IR SOLN
Status: DC | PRN
  Administered 2019-04-04: 1000 mL

## 2019-04-04 MED ORDER — MIDAZOLAM HCL 5 MG/5ML IJ SOLN
INTRAMUSCULAR | Status: DC | PRN
  Administered 2019-04-04: 2 mg via INTRAVENOUS

## 2019-04-04 MED ORDER — PROPOFOL 10 MG/ML IV BOLUS
INTRAVENOUS | Status: AC
  Filled 2019-04-04: qty 40

## 2019-04-04 MED ORDER — ONDANSETRON HCL 4 MG PO TABS: 4.0000 mg | ORAL_TABLET | Freq: Every day | ORAL | 1 refills | Status: DC | PRN

## 2019-04-04 MED ORDER — BUPIVACAINE LIPOSOME 1.3 % IJ SUSP
INTRAMUSCULAR | Status: AC
  Filled 2019-04-04: qty 20

## 2019-04-04 MED ORDER — ONDANSETRON HCL 4 MG/2ML IJ SOLN
INTRAMUSCULAR | Status: AC
  Filled 2019-04-04: qty 2

## 2019-04-04 MED ORDER — SCOPOLAMINE 1 MG/3DAYS TD PT72
1.0000 | MEDICATED_PATCH | TRANSDERMAL | Status: DC
  Administered 2019-04-04: 09:00:00 1.5 mg via TRANSDERMAL
  Filled 2019-04-04: qty 1

## 2019-04-04 MED ORDER — ONDANSETRON HCL 4 MG/2ML IJ SOLN
INTRAMUSCULAR | Status: DC | PRN
  Administered 2019-04-04: 4 mg via INTRAVENOUS

## 2019-04-04 MED ORDER — DEXAMETHASONE SODIUM PHOSPHATE 4 MG/ML IJ SOLN
INTRAMUSCULAR | Status: DC | PRN
  Administered 2019-04-04: 5 mg via INTRAVENOUS

## 2019-04-04 MED ORDER — PROMETHAZINE HCL 25 MG/ML IJ SOLN: 6.2500 mg | INTRAMUSCULAR | Status: DC | PRN

## 2019-04-04 MED ORDER — DOCUSATE SODIUM 100 MG PO CAPS: 100.0000 mg | ORAL_CAPSULE | Freq: Two times a day (BID) | ORAL | 2 refills | Status: DC

## 2019-04-04 MED ORDER — MIDAZOLAM HCL 2 MG/2ML IJ SOLN: 0.5000 mg | Freq: Once | INTRAMUSCULAR | Status: DC | PRN

## 2019-04-04 MED ORDER — ROCURONIUM BROMIDE 10 MG/ML (PF) SYRINGE
PREFILLED_SYRINGE | INTRAVENOUS | Status: AC
  Filled 2019-04-04: qty 10

## 2019-04-04 SURGICAL SUPPLY — 33 items
BLADE SURG 15 STRL LF DISP TIS (BLADE) ×1 IMPLANT
BLADE SURG 15 STRL SS (BLADE) ×1
CHLORAPREP W/TINT 26 (MISCELLANEOUS) ×2 IMPLANT
CLOTH BEACON ORANGE TIMEOUT ST (SAFETY) ×2 IMPLANT
COVER LIGHT HANDLE STERIS (MISCELLANEOUS) ×4 IMPLANT
COVER WAND RF STERILE (DRAPES) ×2 IMPLANT
DERMABOND ADVANCED (GAUZE/BANDAGES/DRESSINGS) ×1
DERMABOND ADVANCED .7 DNX12 (GAUZE/BANDAGES/DRESSINGS) ×1 IMPLANT
DRSG TEGADERM 2-3/8X2-3/4 SM (GAUZE/BANDAGES/DRESSINGS) ×1 IMPLANT
ELECT REM PT RETURN 9FT ADLT (ELECTROSURGICAL) ×2
ELECTRODE REM PT RTRN 9FT ADLT (ELECTROSURGICAL) ×1 IMPLANT
GLOVE BIO SURGEON STRL SZ 6.5 (GLOVE) ×2 IMPLANT
GLOVE BIOGEL PI IND STRL 6.5 (GLOVE) ×1 IMPLANT
GLOVE BIOGEL PI IND STRL 7.0 (GLOVE) ×1 IMPLANT
GLOVE BIOGEL PI INDICATOR 6.5 (GLOVE) ×1
GLOVE BIOGEL PI INDICATOR 7.0 (GLOVE) ×2
GOWN STRL REUS W/ TWL LRG LVL3 (GOWN DISPOSABLE) ×1 IMPLANT
GOWN STRL REUS W/TWL LRG LVL3 (GOWN DISPOSABLE) ×3 IMPLANT
INST SET MINOR GENERAL (KITS) ×2 IMPLANT
KIT TURNOVER KIT A (KITS) ×2 IMPLANT
MANIFOLD NEPTUNE II (INSTRUMENTS) ×2 IMPLANT
MESH VENTRALEX ST 1-7/10 CRC S (Mesh General) ×1 IMPLANT
NEEDLE HYPO 22GX1.5 SAFETY (NEEDLE) ×2 IMPLANT
NS IRRIG 1000ML POUR BTL (IV SOLUTION) ×2 IMPLANT
PACK MINOR (CUSTOM PROCEDURE TRAY) ×2 IMPLANT
PAD ARMBOARD 7.5X6 YLW CONV (MISCELLANEOUS) ×2 IMPLANT
SET BASIN LINEN APH (SET/KITS/TRAYS/PACK) ×2 IMPLANT
SPONGE GAUZE 2X2 8PLY STRL LF (GAUZE/BANDAGES/DRESSINGS) ×2 IMPLANT
SUT ETHIBOND NAB MO 7 #0 18IN (SUTURE) ×2 IMPLANT
SUT MNCRL AB 4-0 PS2 18 (SUTURE) ×2 IMPLANT
SUT VIC AB 3-0 SH 27 (SUTURE) ×1
SUT VIC AB 3-0 SH 27X BRD (SUTURE) ×1 IMPLANT
SYR 20CC LL (SYRINGE) ×2 IMPLANT

## 2019-04-04 NOTE — Anesthesia Preprocedure Evaluation (Signed)
Anesthesia Evaluation  Patient identified by MRN, date of birth, ID band Patient awake    Reviewed: Allergy & Precautions, NPO status , Patient's Chart, lab work & pertinent test results  Airway Mallampati: I  TM Distance: >3 FB Neck ROM: Full    Dental no notable dental hx. (+) Teeth Intact   Pulmonary neg pulmonary ROS, former smoker,    Pulmonary exam normal breath sounds clear to auscultation       Cardiovascular Exercise Tolerance: Good Normal cardiovascular exam+ dysrhythmias I Rhythm:Regular Rate:Normal  ST on ECG o/w normal  Reports Good ET Denies CP/DOE   Neuro/Psych  Headaches, Anxiety Depression negative psych ROS   GI/Hepatic Neg liver ROS, GERD  Medicated and Controlled,  Endo/Other  negative endocrine ROS  Renal/GU negative Renal ROS  negative genitourinary   Musculoskeletal negative musculoskeletal ROS (+)   Abdominal   Peds negative pediatric ROS (+)  Hematology negative hematology ROS (+)   Anesthesia Other Findings   Reproductive/Obstetrics negative OB ROS                             Anesthesia Physical Anesthesia Plan  ASA: II  Anesthesia Plan: General   Post-op Pain Management:    Induction: Intravenous  PONV Risk Score and Plan: 3 and Ondansetron, Midazolam, Dexamethasone and Treatment may vary due to age or medical condition  Airway Management Planned: Oral ETT  Additional Equipment:   Intra-op Plan:   Post-operative Plan: Extubation in OR  Informed Consent: I have reviewed the patients History and Physical, chart, labs and discussed the procedure including the risks, benefits and alternatives for the proposed anesthesia with the patient or authorized representative who has indicated his/her understanding and acceptance.     Dental advisory given  Plan Discussed with: CRNA  Anesthesia Plan Comments: (Plan Full PPE use Plan GETA D/W PT -WTP with  same after Q&A  )        Anesthesia Quick Evaluation

## 2019-04-04 NOTE — Transfer of Care (Signed)
Immediate Anesthesia Transfer of Care Note  Patient: Emily Morales  Procedure(s) Performed: HERNIA REPAIR UMBILICAL ADULT WITH MESH (N/A )  Patient Location: PACU  Anesthesia Type:General  Level of Consciousness: awake, alert , oriented and patient cooperative  Airway & Oxygen Therapy: Patient Spontanous Breathing  Post-op Assessment: Report given to RN, Post -op Vital signs reviewed and stable and Patient moving all extremities X 4  Post vital signs: Reviewed and stable  Last Vitals:  Vitals Value Taken Time  BP    Temp    Pulse    Resp    SpO2      Last Pain:  Vitals:   04/04/19 0638  TempSrc: Oral  PainSc: 0-No pain         Complications: No apparent anesthesia complications

## 2019-04-04 NOTE — Progress Notes (Signed)
Instructed to remove scopolomine patch on Monday and to wash hands after removal. Patch is behind Right ear

## 2019-04-04 NOTE — Anesthesia Postprocedure Evaluation (Signed)
Anesthesia Post Note  Patient: Emily Morales  Procedure(s) Performed: HERNIA REPAIR UMBILICAL ADULT WITH MESH (N/A )  Patient location during evaluation: PACU Anesthesia Type: General Level of consciousness: awake, awake and alert, oriented and patient cooperative Pain management: pain level controlled Vital Signs Assessment: post-procedure vital signs reviewed and stable Respiratory status: spontaneous breathing and nonlabored ventilation Cardiovascular status: stable Postop Assessment: no apparent nausea or vomiting Anesthetic complications: no     Last Vitals:  Vitals:   04/04/19 0638  BP: 121/74  Pulse: 88  Resp: 19  Temp: 36.9 C  SpO2: 100%    Last Pain:  Vitals:   04/04/19 0638  TempSrc: Oral  PainSc: 0-No pain                 Deddrick Saindon

## 2019-04-04 NOTE — Discharge Instructions (Signed)
Discharge Instructions:  Common Complaints: Pain at the incision site is common. This will improve with time. Take your pain medications as described below. Some nausea is common and poor appetite. The main goal is to stay hydrated the first few days after surgery.   Diet/ Activity: Remove the dressing from your belly button after 2 days. You have glue over the incision, and this will remain in place.  Diet as tolerated.   You may not have a large appetite, but it is important to stay hydrated. Drink 64 ounces of water a day. Your appetite will return with time.  Shower per your regular routine daily.  Do not take hot showers. Take warm showers that are less than 10 minutes. Pat the incision dry. Wear an abdominal binder daily with activity. You do not have to wear this while sleeping or sitting.  Rest and listen to your body, but do not remain in bed all day.  Walk everyday for at least 15-20 minutes. Deep cough and move around every 1-2 hours in the first few days after surgery.  Do not lift > 10 lbs, perform excessive bending, pushing, pulling, squatting for 6-8 weeks after surgery.  The activity restrictions and the abdominal binder are to prevent hernia formation at your incision while you are healing.  Do not place lotions or balms on your incision unless instructed to specifically by Dr. Constance Haw.   Medication: Take tylenol and ibuprofen as needed for pain control, alternating every 4-6 hours.  Example:  Tylenol 1000mg  @ 6am, 12noon, 6pm, 12midnight (Do not exceed 4000mg  of tylenol a day). Ibuprofen 800mg  @ 9am, 3pm, 9pm, 3am (Do not exceed 3600mg  of ibuprofen a day).  Take Roxicodone for breakthrough pain every 4 hours.  Take Colace for constipation related to narcotic pain medication. If you do not have a bowel movement in 2 days, take Miralax over the counter.  Drink plenty of water to also prevent constipation.   Contact Information: If you have questions or concerns, please call  our office, [phone], Monday- Thursday 8AM-5PM and Friday 8AM-12Noon.  If it is after hours or on the weekend, please call [facility] Main Number, [phone], and ask to speak to the surgeon on call for Dr. Constance Haw at [facility].     Open Hernia Repair, Adult, Care After This sheet gives you information about how to care for yourself after your procedure. Your health care provider may also give you more specific instructions. If you have problems or questions, contact your health care provider. What can I expect after the procedure? After the procedure, it is common to have:  Mild discomfort.  Slight bruising.  Minor swelling.  Pain in the abdomen. Follow these instructions at home: Incision care   Follow instructions from your health care provider about how to take care of your incision area. Make sure you: ? Wash your hands with soap and water before you change your bandage (dressing). If soap and water are not available, use hand sanitizer. ? Change your dressing as told by your health care provider. ? Leave stitches (sutures), skin glue, or adhesive strips in place. These skin closures may need to stay in place for 2 weeks or longer. If adhesive strip edges start to loosen and curl up, you may trim the loose edges. Do not remove adhesive strips completely unless your health care provider tells you to do that.  Check your incision area every day for signs of infection. Check for: ? More redness, swelling, or pain. ?  More fluid or blood. ? Warmth. ? Pus or a bad smell. Activity  Do not drive or use heavy machinery while taking prescription pain medicine. Do not drive until your health care provider approves.  Until your health care provider approves: ? Do not lift anything that is heavier than 10 lb (4.5 kg). ? Do not play contact sports.  Return to your normal activities as told by your health care provider. Ask your health care provider what activities are safe. General  instructions  To prevent or treat constipation while you are taking prescription pain medicine, your health care provider may recommend that you: ? Drink enough fluid to keep your urine clear or pale yellow. ? Take over-the-counter or prescription medicines. ? Eat foods that are high in fiber, such as fresh fruits and vegetables, whole grains, and beans. ? Limit foods that are high in fat and processed sugars, such as fried and sweet foods.  Take over-the-counter and prescription medicines only as told by your health care provider.  Do not take tub baths or go swimming until your health care provider approves.  Keep all follow-up visits as told by your health care provider. This is important. Contact a health care provider if:  You develop a rash.  You have more redness, swelling, or pain around your incision.  You have more fluid or blood coming from your incision.  Your incision feels warm to the touch.  You have pus or a bad smell coming from your incision.  You have a fever or chills.  You have blood in your stool (feces).  You have not had a bowel movement in 2-3 days.  Your pain is not controlled with medicine. Get help right away if:  You have chest pain or shortness of breath.  You feel light-headed or feel faint.  You have severe pain.  You vomit and your pain is worse. This information is not intended to replace advice given to you by your health care provider. Make sure you discuss any questions you have with your health care provider.   General Anesthesia, Adult, Care After This sheet gives you information about how to care for yourself after your procedure. Your health care provider may also give you more specific instructions. If you have problems or questions, contact your health care provider. What can I expect after the procedure? After the procedure, the following side effects are common:  Pain or discomfort at the IV  site.  Nausea.  Vomiting.  Sore throat.  Trouble concentrating.  Feeling cold or chills.  Weak or tired.  Sleepiness and fatigue.  Soreness and body aches. These side effects can affect parts of the body that were not involved in surgery. Follow these instructions at home:  For at least 24 hours after the procedure:  Have a responsible adult stay with you. It is important to have someone help care for you until you are awake and alert.  Rest as needed.  Do not: ? Participate in activities in which you could fall or become injured. ? Drive. ? Use heavy machinery. ? Drink alcohol. ? Take sleeping pills or medicines that cause drowsiness. ? Make important decisions or sign legal documents. ? Take care of children on your own. Eating and drinking  Follow any instructions from your health care provider about eating or drinking restrictions.  When you feel hungry, start by eating small amounts of foods that are soft and easy to digest (bland), such as toast. Gradually return to your  regular diet.  Drink enough fluid to keep your urine pale yellow.  If you vomit, rehydrate by drinking water, juice, or clear broth. General instructions  If you have sleep apnea, surgery and certain medicines can increase your risk for breathing problems. Follow instructions from your health care provider about wearing your sleep device: ? Anytime you are sleeping, including during daytime naps. ? While taking prescription pain medicines, sleeping medicines, or medicines that make you drowsy.  Return to your normal activities as told by your health care provider. Ask your health care provider what activities are safe for you.  Take over-the-counter and prescription medicines only as told by your health care provider.  If you smoke, do not smoke without supervision.  Keep all follow-up visits as told by your health care provider. This is important. Contact a health care provider if:  You  have nausea or vomiting that does not get better with medicine.  You cannot eat or drink without vomiting.  You have pain that does not get better with medicine.  You are unable to pass urine.  You develop a skin rash.  You have a fever.  You have redness around your IV site that gets worse. Get help right away if:  You have difficulty breathing.  You have chest pain.  You have blood in your urine or stool, or you vomit blood. Summary  After the procedure, it is common to have a sore throat or nausea. It is also common to feel tired.  Have a responsible adult stay with you for the first 24 hours after general anesthesia. It is important to have someone help care for you until you are awake and alert.  When you feel hungry, start by eating small amounts of foods that are soft and easy to digest (bland), such as toast. Gradually return to your regular diet.  Drink enough fluid to keep your urine pale yellow.  Return to your normal activities as told by your health care provider. Ask your health care provider what activities are safe for you. This information is not intended to replace advice given to you by your health care provider. Make sure you discuss any questions you have with your health care provider.  Bupivacaine Liposomal Suspension for Injection What is this medicine? BUPIVACAINE LIPOSOMAL (bue PIV a kane LIP oh som al) is an anesthetic. It causes loss of feeling in the skin or other tissues. It is used to prevent and to treat pain from some procedures. This medicine may be used for other purposes; ask your health care provider or pharmacist if you have questions. COMMON BRAND NAME(S): EXPAREL What should I tell my health care provider before I take this medicine? They need to know if you have any of these conditions:  heart disease  kidney disease  liver disease  an unusual or allergic reaction to bupivacaine, other medicines, foods, dyes, or  preservatives  pregnant or trying to get pregnant  breast-feeding How should I use this medicine? This medicine is for injection into the affected area. It is given by a health care professional in a hospital or clinic setting. Talk to your pediatrician regarding the use of this medicine in children. Special care may be needed. Overdosage: If you think you have taken too much of this medicine contact a poison control center or emergency room at once. NOTE: This medicine is only for you. Do not share this medicine with others. What if I miss a dose? This does not apply.  What may interact with this medicine? This medicine may interact with the following medications:  acetaminophen  certain antibiotics like dapsone, nitrofurantoin, aminosalicylic acid, sulfasalazine  certain medicines for seizures like phenobarbital, phenytoin, valproic acid  chloroquine  cyclophosphamide  flutamide  hydroxyurea  ifosfamide  metoclopramide  nitroglycerin  other local anesthetics like lidocaine, pramoxine, tetracaine  primaquine  quinine This list may not describe all possible interactions. Give your health care provider a list of all the medicines, herbs, non-prescription drugs, or dietary supplements you use. Also tell them if you smoke, drink alcohol, or use illegal drugs. Some items may interact with your medicine. What should I watch for while using this medicine? Your condition will be monitored carefully while you are receiving this medicine. Be careful to avoid injury while the area is numb and you are not aware of pain. If you have a procedure in the next 4 days, tell your provider you had this medicine. You should not get a second injection in the same area. What side effects may I notice from receiving this medicine? Side effects that you should report to your doctor or health care professional as soon as possible:  allergic reactions like skin rash, itching or hives, swelling  of the face, lips, or tongue  breathing problems  changes in vision  dizziness  fast or slow, irregular heartbeat  joint pain, stiffness, or loss of motion  seizures Side effects that usually do not require medical attention (report to your doctor or health care professional if they continue or are bothersome):  constipation  irritation at site where injected  nausea, vomiting  tiredness This list may not describe all possible side effects. Call your doctor for medical advice about side effects. You may report side effects to FDA at 1-800-FDA-1088. Where should I keep my medicine? This drug is given in a hospital or clinic and will not be stored at home. NOTE: This sheet is a summary. It may not cover all possible information. If you have questions about this medicine, talk to your doctor, pharmacist, or health care provider.  2020 Elsevier/Gold Standard (2018-04-29 13:54:45)  Document Released: 10/23/2000 Document Revised: 07/20/2017 Document Reviewed: 03/02/2017 Elsevier Patient Education  2020 [place] Released: 02/03/2005 Document Revised: 06/29/2017 Document Reviewed: 12/29/2015 Elsevier Patient Education  2020 [employer].

## 2019-04-04 NOTE — Anesthesia Procedure Notes (Signed)
Procedure Name: Intubation Date/Time: 04/04/2019 7:40 AM Performed by: Jonna Munro, CRNA Pre-anesthesia Checklist: Patient identified, Emergency Drugs available, Suction available, Patient being monitored and Timeout performed Patient Re-evaluated:Patient Re-evaluated prior to induction Oxygen Delivery Method: Circle system utilized Preoxygenation: Pre-oxygenation with 100% oxygen Induction Type: IV induction Ventilation: Mask ventilation without difficulty Laryngoscope Size: Mac and 3 Grade View: Grade I Tube type: Oral Tube size: 7.0 mm Number of attempts: 1 Airway Equipment and Method: Stylet Placement Confirmation: ETT inserted through vocal cords under direct vision,  positive ETCO2 and breath sounds checked- equal and bilateral Secured at: 22 cm Tube secured with: Tape Dental Injury: Teeth and Oropharynx as per pre-operative assessment

## 2019-04-04 NOTE — Interval H&P Note (Signed)
History and Physical Interval Note:  04/04/2019 7:16 AM  Emily Morales  has presented today for surgery, with the diagnosis of umbilical hernia.  The various methods of treatment have been discussed with the patient and family. After consideration of risks, benefits and other options for treatment, the patient has consented to  Procedure(s): HERNIA REPAIR UMBILICAL ADULT WITH MESH (N/A) as a surgical intervention.  The patient's history has been reviewed, patient examined, no change in status, stable for surgery.  I have reviewed the patient's chart and labs.  Questions were answered to the patient's satisfaction.    No new questions or complaints.   Virl Cagey

## 2019-04-04 NOTE — Op Note (Addendum)
[facility] Operative Note  04/04/19  Preoperative Diagnosis: Umbilical hernia, pinpoint epigastric hernia   Postoperative Diagnosis: Same   Procedure(s) Performed: Umbilical hernia repair with mesh, closure of pinpoint fascial defect in epigastric area   Surgeon: Lanell Matar. Constance Haw, MD   Assistants: No qualified resident was available    Anesthesia: General endotracheal   Anesthesiologist: Lenice Llamas, MD    Specimens: None   Estimated Blood Loss: Minimal   Blood Replacement: None    Complications: None   Wound Class: Clean   Operative Indications: Ms. Emily Morales is a 28 yo with a history of an umbilical hernia that has been causing her pain and discomfort for the past several months. She also was found to have a pinpoint fascial defect with preperitoneal fat on CT imaging in addition to her umbilical hernia.  The patient had complaints of nausea but I told her this was unlikely from this hernia as it contains no bowel and is very small in size.  We discussed the risk of repair including bleeding, infection, use of mesh, recurrence risk, and injury to other organs, and she opted to proceed. While repairing the umbilical hernia, I plan to close the pinpoint defect superior to the umbilicus with a suture.   Findings: Small umbilical hernia, 1cm defect, with pinpoint fascial defect superior with preperitoneal fat    Procedure: The patient was taken to the operating room and placed supine. General endotracheal anesthesia was induced. Intravenous antibiotics were administered per protocol.  The abdomen was prepared and draped in the usual sterile fashion.   The umbilical hernia was noted to be reducible and measured about 1cm. An incision was made above the umbilicus, and carried down through the subcutaneous tissue with electrocautery.  Dissection was performed down to the level of the fascia, exposing the hernia sac.  The hernia sac was opened with care, and excess  hernia sac was resected with electrocautery.  A finger was ran on the underlying peritoneum and this was clear.  The fascia superior to the umbilicus was cleared for about 2 cm, and a small pinpoint defect was noted with preperitoneal fat. This fat was excised.  A 4.3 cm Ventralex St Hernia Patch was placed and secured with 0 Ethibond sutures ensuring that it was against the peritoneal cavity.  The hernia defect was then closed with 0 Ethibond suture in an interrupted fashion over the patch.  The pinpoint defect was closed with an 0 Ethibond suture.  The umbilicus was tacked to the fascia with a 3-0 Vicryl suture.   Hemostasis was confirmed. The skin was closed with a running 4-0 Monocryl suture and dermabond.  After the dermabond dried a 2X2 and tegaderm were placed over the umbilicus to act as a pressure dressing.    All counts were correct at the end of the case. The patient was awakened from anesthesia and extubated without complication.  The patient went to the PACU in stable condition.   Curlene Labrum, MD [facility] [address] [place], [address] [address] [phone] (office)

## 2019-04-07 ENCOUNTER — Telehealth: Payer: No Typology Code available for payment source | Admitting: Nurse Practitioner

## 2019-04-07 DIAGNOSIS — H60331 Swimmer's ear, right ear: Secondary | ICD-10-CM

## 2019-04-07 NOTE — Progress Notes (Signed)
Based on what you shared with me it looks like you have swimmers ear ,that should be evaluated in a face to face office visit. Ciprodex is the best treatment for swimmers ear, and is what we usein evisit for swimmers ear. Since you have been using cirpodex and it has not helped then you will need a face to face visit, so that ear can be looked'   NOTE: If you entered your credit card information for this eVisit, you will not be charged. You may see a "hold" on your card for the $30 but that hold will drop off and you will not have a charge processed.  If you are having a true medical emergency please call 911.  If you need an urgent face to face visit, [facility] has four urgent care centers for your convenience.  If you need care fast and have a high deductible or no insurance consider:   [email or web address] to reserve your spot online an avoid wait times  [facility] [address], [id number] [place], [place] [address] 8 am to 8 pm Monday-Friday 10 am to 4 pm Saturday-Sunday *Across the street from [employer]  [place], [address] 8 am to 5 pm Monday-Friday * In the [facility] on the [facility]   The following sites will take your  insurance:  . [facility] [place] a Provider at this Location  [address] [place], [place] [address] . 10 am to 8 pm Monday-Friday . 12 pm to 8 pm Saturday-Sunday   . [facility] at [place] a Provider at this Location  [address] [place], [place], [place] [address] . 8 am to 8 pm Monday-Friday . 9 am to 6 pm Saturday . 11 am to 6 pm Sunday   . [facility] at [place] Get Driving Directions  [id number] [place], [place] [address] . 8 am to 8 pm Monday-Friday . 8 am to 4 pm  Saturday-Sunday   Your e-visit answers were reviewed by a board certified advanced clinical practitioner to complete your personal care plan.

## 2019-04-08 ENCOUNTER — Encounter (HOSPITAL_COMMUNITY): Payer: Self-pay | Admitting: General Surgery

## 2019-04-08 ENCOUNTER — Other Ambulatory Visit: Payer: Self-pay | Admitting: General Surgery

## 2019-04-08 ENCOUNTER — Encounter: Payer: Self-pay | Admitting: Family Medicine

## 2019-04-09 NOTE — Telephone Encounter (Signed)
Needs appt

## 2019-04-10 NOTE — Telephone Encounter (Signed)
LVM for pt to call back.

## 2019-04-14 ENCOUNTER — Telehealth (HOSPITAL_COMMUNITY): Payer: Self-pay | Admitting: Psychiatry

## 2019-04-14 MED ORDER — FLUOXETINE HCL 20 MG PO CAPS: 20.0000 mg | ORAL_CAPSULE | Freq: Every day | ORAL | 0 refills | Status: DC

## 2019-04-14 NOTE — Telephone Encounter (Signed)
Ordered refill for fluoxetine per request. Please contact the patient to make follow up appointment, and notify of no show policy. Will not plan to prescribe any refill without evaluation.

## 2019-04-14 NOTE — Telephone Encounter (Signed)
SPOKE WITH PATIENT TO INFORM PER PROVIDER : Ordered refill for fluoxetine per request. Please contact the patient to make follow up appointment, and notify of no show policy. Will not plan to prescribe any refill without evaluation.  F/U APPT  04/18/2019 & REVIEWED NO SHOW POLICY

## 2019-04-16 NOTE — Progress Notes (Signed)
Virtual Visit via Video Note  I connected with Emily Morales on 04/18/19 at  9:40 AM EDT by a video enabled telemedicine application and verified that I am speaking with the correct person using two identifiers.   I discussed the limitations of evaluation and management by telemedicine and the availability of in person appointments. The patient expressed understanding and agreed to proceed.     I discussed the assessment and treatment plan with the patient. The patient was provided an opportunity to ask questions and all were answered. The patient agreed with the plan and demonstrated an understanding of the instructions.   The patient was advised to call back or seek an in-person evaluation if the symptoms worsen or if the condition fails to improve as anticipated.  I provided 15 minutes of non-face-to-face time during this encounter.   Emily Clay, MD    [facility] MD/PA/NP OP Progress Note  04/18/2019 10:11 AM Emily Morales  MRN:  [id number]  Chief Complaint:  Chief Complaint    Anxiety; Depression; Follow-up     HPI:  This is a follow-up appointment for depression and anxiety.  She states that she discontinued Prozac after trial of 2 weeks as she felt it did not work.  She is willing to re-start the medication again. She has been "stuck in the house" since surgery for umbilical hernia. She has been on leave since August through 9/28. She does "nothing" at home. On further inquiry, she helps her son's (diagnosed with ADHD) virtual learning. She feels irritable. There was an episode of her slammed the door and broke it after she found out that her son did not get help while he was with other family members. She felt bad afterwards. She denies any other aggressive episode or HI. She sleeps better after uptitration of quetiapine by PCP. She has increased appetite. She has fair concentration. She feels depressed, fatigue. She denies SI. She feels anxious, tense. She had a panic  attacks when she was watching TV.   224 lbs Wt Readings from Last 3 Encounters:  04/01/19 217 lb (98.4 kg)  03/11/19 219 lb (99.3 kg)  02/04/19 215 lb (97.5 kg)    Visit Diagnosis:    ICD-10-CM   1. MDD (major depressive disorder), recurrent episode, mild ([place])  F33.0   2. Panic disorder  F41.0     Past Psychiatric History: Please see initial evaluation for full details. I have reviewed the history. No updates at this time.     Past Medical History:  Past Medical History:  Diagnosis Date  . Abnormal Pap smear   . Anemia   . Anxiety   . Arthritis   . Chronic back pain   . Depression   . Dysrhythmia    irregular heartrate  . GERD (gastroesophageal reflux disease)   . Headache(784.0)   . Hx of chlamydia infection   . Ovarian cyst, right   . Perimenopausal   . Uterine fibroid     Past Surgical History:  Procedure Laterality Date  . BIOPSY  11/14/2016   Procedure: BIOPSY;  Surgeon: Danie Binder, MD;  [facility];  Service: Endoscopy;;  duodenal gastric  . COLPOSCOPY    . ESOPHAGOGASTRODUODENOSCOPY (EGD) WITH PROPOFOL N/A 11/14/2016   Dr. Oneida Alar: Mild gastritis, normal esophagus, biopsy showed inflammation of the small bowel, gastric biopsies with no H. pylori.  . lipoma surgery    . MASS EXCISION N/A 02/28/2017   Procedure: EXCISION LIPOMA SCALP 3CM;  Surgeon: Arnoldo Morale,  Elta Guadeloupe, MD;  Location: [facility];  Service: General;  Laterality: N/A;  . UMBILICAL HERNIA REPAIR N/A 04/04/2019   Procedure: HERNIA REPAIR UMBILICAL ADULT WITH MESH;  Surgeon: Virl Cagey, MD;  Location: [facility];  Service: General;  Laterality: N/A;    Family Psychiatric History: Please see initial evaluation for full details. I have reviewed the history. No updates at this time.     Family History:  Family History  Problem Relation Age of Onset  . Diabetes Father   . Hypertension Father   . Other Son        reactive airway disease  . Asthma Son   . Heart murmur Son   . Depression  Mother   . Anxiety disorder Mother   . Cancer Maternal Grandmother        lung  . Lung cancer Maternal Grandmother   . Heart disease Paternal Grandmother   . Hypertension Paternal Grandmother   . Liver disease Paternal Grandfather        etoh related  . Alcohol abuse Maternal Grandfather   . Colon cancer Neg Hx     Social History:  Social History   Socioeconomic History  . Marital status: Married    Spouse name: Not on file  . Number of children: 2  . Years of education: Not on file  . Highest education level: Not on file  Occupational History  . Occupation: clinical care coordinator    Comment: [employer]  Social Needs  . Financial resource strain: Not on file  . Food insecurity    Worry: Not on file    Inability: Not on file  . Transportation needs    Medical: Not on file    Non-medical: Not on file  Tobacco Use  . Smoking status: Former Smoker    Types: Cigarettes  . Smokeless tobacco: Never Used  Substance and Sexual Activity  . Alcohol use: Yes  . Drug use: Yes    Types: Other-see comments    Comment: CBD drops  . Sexual activity: Yes    Partners: Male    Birth control/protection: None  Lifestyle  . Physical activity    Days per week: Not on file    Minutes per session: Not on file  . Stress: Not on file  Relationships  . Social Herbalist on phone: Not on file    Gets together: Not on file    Attends religious service: Not on file    Active member of club or organization: Not on file    Attends meetings of clubs or organizations: Not on file    Relationship status: Not on file  Other Topics Concern  . Not on file  Social History Narrative  . Not on file    Allergies:  Allergies  Allergen Reactions  . Pollen Extract Cough  . Latex Rash  . Other Rash    Coconut causes a rash    Metabolic Disorder Labs: No results found for: HGBA1C, MPG No results found for: PROLACTIN No results found for: CHOL, TRIG, HDL, CHOLHDL,  VLDL, LDLCALC Lab Results  Component Value Date   TSH 2.690 05/02/2018    Therapeutic Level Labs: No results found for: LITHIUM No results found for: VALPROATE No components found for:  CBMZ  Current Medications: Current Outpatient Medications  Medication Sig Dispense Refill  . docusate sodium (COLACE) 100 MG capsule Take 1 capsule (100 mg total) by mouth 2 (two) times daily. 60 capsule 2  .  fexofenadine (ALLEGRA ALLERGY) 180 MG tablet Take 1 tablet (180 mg total) by mouth daily. 30 tablet 3  . [START ON 05/16/2019] FLUoxetine (PROZAC) 20 MG capsule Take 1 capsule (20 mg total) by mouth daily. 30 capsule 0  . megestrol (MEGACE) 40 MG tablet 3 tablets a day for 5 days, 2 tablets a day for 5 days then 1 tablet daily (Patient taking differently: Take 40 mg by mouth daily as needed (bleeding). ) 45 tablet 3  . omeprazole (PRILOSEC) 40 MG capsule Take 1 capsule (40 mg total) by mouth daily before lunch. (Patient not taking: Reported on 03/28/2019) 90 capsule 3  . ondansetron (ZOFRAN) 4 MG tablet Take 1 tablet (4 mg total) by mouth daily as needed for nausea or vomiting. 30 tablet 1  . oxyCODONE (ROXICODONE) 5 MG immediate release tablet Take 1 tablet (5 mg total) by mouth every 4 (four) hours as needed. 30 tablet 0  . Plecanatide (TRULANCE) 3 MG TABS Take 3 mg by mouth daily. (Patient not taking: Reported on 03/28/2019) 90 tablet 3  . QUEtiapine (SEROQUEL) 100 MG tablet Take 1 tablet (100 mg total) by mouth at bedtime. 30 tablet 0  . QUEtiapine (SEROQUEL) 50 MG tablet Take 0.5-1 tablets (25-50 mg total) by mouth at bedtime. 30 tablet 0  . SUMAtriptan (IMITREX) 50 MG tablet Take 1 tab at onset of migraine. May repeat in 2 hours if headache persists or recurs. 10 tablet 0   No current facility-administered medications for this visit.      Musculoskeletal: Strength & Muscle Tone: N/A Gait & Station: N/A Patient leans: N/A  Psychiatric Specialty Exam: Review of Systems   Psychiatric/Behavioral: Positive for depression. Negative for hallucinations, memory loss, substance abuse and suicidal ideas. The patient is nervous/anxious. The patient does not have insomnia.   All other systems reviewed and are negative.   Last menstrual period 03/21/2019.There is no height or weight on file to calculate BMI.  General Appearance: Fairly Groomed  Eye Contact:  Good  Speech:  Clear and Coherent  Volume:  Normal  Mood:  Depressed  Affect:  Appropriate, Congruent and Restricted  Thought Process:  Coherent  Orientation:  Full (Time, Place, and Person)  Thought Content: Logical   Suicidal Thoughts:  No  Homicidal Thoughts:  No  Memory:  Immediate;   Good  Judgement:  Good  Insight:  Fair  Psychomotor Activity:  Normal  Concentration:  Concentration: Good and Attention Span: Good  Recall:  Good  Fund of Knowledge: Good  Language: Good  Akathisia:  No  Handed:  Right  AIMS (if indicated): not done  Assets:  Communication Skills Desire for Improvement  ADL's:  Intact  Cognition: WNL  Sleep:  Fair   Screenings: GAD-7     Video Visit from 12/13/2018 in [place] Visit from 08/15/2018 in [place]  Total GAD-7 Score  12  15    PHQ2-9     Video Visit from 12/13/2018 in [place] Visit from 08/15/2018 in [place]  PHQ-2 Total Score  4  4  PHQ-9 Total Score  11  16       Assessment and Plan:  Emily Morales is a 28 y.o. year old female with a history of anxiety, depression, migraine, who presents for follow up appointment for MDD (major depressive disorder), recurrent episode, mild ([place])  Panic disorder  # MDD, mild, recurrent without psychotic features # Panic disorder Patient reports depressive symptoms and panic  attacks since the last visit.  Psychosocial stressors includes marital conflict, although she reports improvement in the relationship with her husband. She self  discontinued fluoxetine without any side effect.  Also noted that she did have a trial of antidepressants in the past, which she was on only for short period and low-dose.  Provided psychoeducation of adherence to medication.  Will retry fluoxetine to target depression and panic attacks.  Discussed risk of serotonin syndrome in the context of using Imitrex.  Will lower the dose of quetiapine given she has had weight gain (uptitrated by PCP); will use this medication as adjunctive treatment for depression and anxiety.  Discussed potential metabolic side effect. She will greatly benefit from CBT; will make a referral.   Plan 1. Reinitiate fluoxetine 20 mg daily  2. Decrease quetiapine 100 mg at night (PCP increased to 150 mg since the last visit)  3. Referral to therapy 4. Next appointment: 10/23 at 9 AM for 30 mins, video - discussed attendance policy  Past trials of medication: sertraline (sexual side effect, tried 50mg ), fluoxetine (10mg ), citalopram, escitalopram, bupropion (150 mg), Trintellix (sexual side effect), venlafaxine, Buspar, quetiapine, clonazepam  The patient demonstrates the following risk factors for suicide: Chronic risk factors for suicide include: psychiatric disorder of depression, anxiety. Acute risk factors for suicide include: family or marital conflict. Protective factors for this patient include: positive social support, responsibility to others (children, family), coping skills and hope for the future. Considering these factors, the overall suicide risk at this point appears to be low. Patient is appropriate for outpatient follow up.  Emily Clay, MD 04/18/2019, 10:11 AM

## 2019-04-17 ENCOUNTER — Other Ambulatory Visit: Payer: Self-pay | Admitting: General Surgery

## 2019-04-18 ENCOUNTER — Encounter (HOSPITAL_COMMUNITY): Payer: Self-pay | Admitting: Psychiatry

## 2019-04-18 ENCOUNTER — Other Ambulatory Visit: Payer: Self-pay

## 2019-04-18 ENCOUNTER — Ambulatory Visit (INDEPENDENT_AMBULATORY_CARE_PROVIDER_SITE_OTHER): Payer: No Typology Code available for payment source | Admitting: Psychiatry

## 2019-04-18 DIAGNOSIS — F330 Major depressive disorder, recurrent, mild: Secondary | ICD-10-CM

## 2019-04-18 DIAGNOSIS — F410 Panic disorder [episodic paroxysmal anxiety] without agoraphobia: Secondary | ICD-10-CM

## 2019-04-18 MED ORDER — FLUOXETINE HCL 20 MG PO CAPS: 20.0000 mg | ORAL_CAPSULE | Freq: Every day | ORAL | 0 refills | Status: DC

## 2019-04-18 NOTE — Patient Instructions (Signed)
1. Reinitiate fluoxetine 20 mg daily  2. Decrease quetiapine 100 mg at night  3. Referral to therapy 4. Next appointment: 10/23 at 9 AM

## 2019-04-21 ENCOUNTER — Ambulatory Visit (INDEPENDENT_AMBULATORY_CARE_PROVIDER_SITE_OTHER): Payer: No Typology Code available for payment source | Admitting: Family Medicine

## 2019-04-21 ENCOUNTER — Other Ambulatory Visit: Payer: Self-pay

## 2019-04-21 ENCOUNTER — Encounter: Payer: Self-pay | Admitting: Family Medicine

## 2019-04-21 VITALS — BP 120/77 | HR 85 | Temp 98.4°F | Ht 65.5 in | Wt 222.0 lb

## 2019-04-21 DIAGNOSIS — N898 Other specified noninflammatory disorders of vagina: Secondary | ICD-10-CM | POA: Diagnosis not present

## 2019-04-21 DIAGNOSIS — Z111 Encounter for screening for respiratory tuberculosis: Secondary | ICD-10-CM | POA: Diagnosis not present

## 2019-04-21 DIAGNOSIS — Z0000 Encounter for general adult medical examination without abnormal findings: Secondary | ICD-10-CM

## 2019-04-21 DIAGNOSIS — Z23 Encounter for immunization: Secondary | ICD-10-CM

## 2019-04-21 LAB — MICROSCOPIC EXAMINATION: RBC, Urine: >30 /hpf — AB (ref 0–2)

## 2019-04-21 LAB — UA/M W/RFLX CULTURE, ROUTINE
Bilirubin, UA: NEGATIVE
Glucose, UA: NEGATIVE
Ketones, UA: NEGATIVE
Leukocytes,UA: NEGATIVE
Nitrite, UA: NEGATIVE
Specific Gravity, UA: 1.025 (ref 1.005–1.030)
Urobilinogen, Ur: 0.2 mg/dL (ref 0.2–1.0)
pH, UA: 5 (ref 5.0–7.5)

## 2019-04-21 MED ORDER — NYSTATIN 100000 UNIT/GM EX CREA: 1.0000 "application " | TOPICAL_CREAM | Freq: Two times a day (BID) | CUTANEOUS | 0 refills | Status: DC

## 2019-04-21 NOTE — Progress Notes (Signed)
BP 120/77 (BP Location: Left Arm, Patient Position: Sitting, Cuff Size: Normal)   Pulse 85   Temp 98.4 F (36.9 C) (Oral)   Ht 5' 5.5" (1.664 m)   Wt 222 lb (100.7 kg)   SpO2 98%   BMI 36.38 kg/m    Subjective:    Patient ID: Emily Morales, female    DOB: [birth year], 28 y.o.   MRN: [id number]  HPI: Emily Morales is a 28 y.o. female presenting on 04/21/2019 for comprehensive medical examination. Current medical complaints include:see below  Has forms for school, states she needs hep b vaccine and TB test but otherwise was UTD. Does not have previous vaccine history with her today.   Having a mild irritation rash and itching on outer labias the past few weeks. Took a diflucan several weeks ago which temporarily helped. Thinks it's from some scented TP that she's currently using at home from when the stores were sold out of their usual brand during beginning of pandemic. Currently not trying anything OTC for relief.   She currently lives with: Menopausal Symptoms: no  Depression Screen done today and results listed below:  Depression screen [facility] 2/9 04/21/2019 12/13/2018 08/15/2018  Decreased Interest 2 3 2   Down, Depressed, Hopeless 0 1 2  PHQ - 2 Score 2 4 4   Altered sleeping 1 0 3  Tired, decreased energy 2 1 1   Change in appetite 3 3 3   Feeling bad or failure about yourself  0 0 1  Trouble concentrating 1 3 2   Moving slowly or fidgety/restless 0 0 2  Suicidal thoughts 0 0 0  PHQ-9 Score 9 11 16   Difficult doing work/chores - Somewhat difficult -  Some recent data might be hidden    The patient does not have a history of falls. I did not complete a risk assessment for falls. A plan of care for falls was not documented.   Past Medical History:  Past Medical History:  Diagnosis Date  . Abnormal Pap smear   . Anemia   . Anxiety   . Arthritis   . Chronic back pain   . Depression   . Dysrhythmia    irregular heartrate  . GERD (gastroesophageal reflux  disease)   . Headache(784.0)   . Hx of chlamydia infection   . Ovarian cyst, right   . Perimenopausal   . Uterine fibroid     Surgical History:  Past Surgical History:  Procedure Laterality Date  . BIOPSY  11/14/2016   Procedure: BIOPSY;  Surgeon: Danie Binder, MD;  [facility];  Service: Endoscopy;;  duodenal gastric  . COLPOSCOPY    . ESOPHAGOGASTRODUODENOSCOPY (EGD) WITH PROPOFOL N/A 11/14/2016   Dr. Oneida Alar: Mild gastritis, normal esophagus, biopsy showed inflammation of the small bowel, gastric biopsies with no H. pylori.  . lipoma surgery    . MASS EXCISION N/A 02/28/2017   Procedure: EXCISION LIPOMA SCALP 3CM;  Surgeon: Aviva Signs, MD;  Location: [facility];  Service: General;  Laterality: N/A;  . UMBILICAL HERNIA REPAIR N/A 04/04/2019   Procedure: HERNIA REPAIR UMBILICAL ADULT WITH MESH;  Surgeon: Virl Cagey, MD;  Location: [facility];  Service: General;  Laterality: N/A;    Medications:  Current Outpatient Medications on File Prior to Visit  Medication Sig  . docusate sodium (COLACE) 100 MG capsule Take 1 capsule (100 mg total) by mouth 2 (two) times daily.  . fexofenadine (ALLEGRA ALLERGY) 180 MG tablet Take 1 tablet (180 mg total)  by mouth daily.  Derrill Memo ON 05/16/2019] FLUoxetine (PROZAC) 20 MG capsule Take 1 capsule (20 mg total) by mouth daily.  . megestrol (MEGACE) 40 MG tablet 3 tablets a day for 5 days, 2 tablets a day for 5 days then 1 tablet daily (Patient taking differently: Take 40 mg by mouth daily as needed (bleeding). )  . omeprazole (PRILOSEC) 40 MG capsule Take 1 capsule (40 mg total) by mouth daily before lunch.  . QUEtiapine (SEROQUEL) 100 MG tablet Take 1 tablet (100 mg total) by mouth at bedtime.  . SUMAtriptan (IMITREX) 50 MG tablet Take 1 tab at onset of migraine. May repeat in 2 hours if headache persists or recurs.   No current facility-administered medications on file prior to visit.     Allergies:  Allergies  Allergen Reactions   . Pollen Extract Cough  . Latex Rash  . Other Rash    Coconut causes a rash    Social History:  Social History   Socioeconomic History  . Marital status: Married    Spouse name: Not on file  . Number of children: 2  . Years of education: Not on file  . Highest education level: Not on file  Occupational History  . Occupation: clinical care coordinator    Comment: [employer]  Social Needs  . Financial resource strain: Not on file  . Food insecurity    Worry: Not on file    Inability: Not on file  . Transportation needs    Medical: Not on file    Non-medical: Not on file  Tobacco Use  . Smoking status: Former Smoker    Types: Cigarettes  . Smokeless tobacco: Never Used  Substance and Sexual Activity  . Alcohol use: Yes  . Drug use: Yes    Types: Other-see comments    Comment: CBD drops  . Sexual activity: Yes    Partners: Male    Birth control/protection: None  Lifestyle  . Physical activity    Days per week: Not on file    Minutes per session: Not on file  . Stress: Not on file  Relationships  . Social Herbalist on phone: Not on file    Gets together: Not on file    Attends religious service: Not on file    Active member of club or organization: Not on file    Attends meetings of clubs or organizations: Not on file    Relationship status: Not on file  . Intimate partner violence    Fear of current or ex partner: Not on file    Emotionally abused: Not on file    Physically abused: Not on file    Forced sexual activity: Not on file  Other Topics Concern  . Not on file  Social History Narrative  . Not on file   Social History   Tobacco Use  Smoking Status Former Smoker  . Types: Cigarettes  Smokeless Tobacco Never Used   Social History   Substance and Sexual Activity  Alcohol Use Yes    Family History:  Family History  Problem Relation Age of Onset  . Diabetes Father   . Hypertension Father   . Other Son        reactive  airway disease  . Asthma Son   . Heart murmur Son   . Depression Mother   . Anxiety disorder Mother   . Cancer Maternal Grandmother        lung  . Lung  cancer Maternal Grandmother   . Heart disease Paternal Grandmother   . Hypertension Paternal Grandmother   . Liver disease Paternal Grandfather        etoh related  . Alcohol abuse Maternal Grandfather   . Colon cancer Neg Hx     Past medical history, surgical history, medications, allergies, family history and social history reviewed with patient today and changes made to appropriate areas of the chart.   Review of Systems - General ROS: negative Psychological ROS: negative Ophthalmic ROS: negative ENT ROS: negative Allergy and Immunology ROS: negative Hematological and Lymphatic ROS: negative Endocrine ROS: negative Breast ROS: negative for breast lumps Respiratory ROS: no cough, shortness of breath, or wheezing Cardiovascular ROS: no chest pain or dyspnea on exertion Gastrointestinal ROS: no abdominal pain, change in bowel habits, or black or bloody stools Genito-Urinary ROS: positive for - vulvar/vaginal symptoms Musculoskeletal ROS: negative Neurological ROS: no TIA or stroke symptoms Dermatological ROS: negative All other ROS negative except what is listed above and in the HPI.      Objective:    BP 120/77 (BP Location: Left Arm, Patient Position: Sitting, Cuff Size: Normal)   Pulse 85   Temp 98.4 F (36.9 C) (Oral)   Ht 5' 5.5" (1.664 m)   Wt 222 lb (100.7 kg)   SpO2 98%   BMI 36.38 kg/m   Wt Readings from Last 3 Encounters:  04/21/19 222 lb (100.7 kg)  04/01/19 217 lb (98.4 kg)  03/11/19 219 lb (99.3 kg)    Physical Exam Vitals signs and nursing note reviewed.  Constitutional:      General: She is not in acute distress.    Appearance: She is well-developed.  HENT:     Head: Atraumatic.     Right Ear: External ear normal.     Left Ear: External ear normal.     Nose: Nose normal.     Mouth/Throat:      Pharynx: No oropharyngeal exudate.  Eyes:     General: No scleral icterus.    Conjunctiva/sclera: Conjunctivae normal.     Pupils: Pupils are equal, round, and reactive to light.  Neck:     Musculoskeletal: Normal range of motion and neck supple.     Thyroid: No thyromegaly.  Cardiovascular:     Rate and Rhythm: Normal rate and regular rhythm.     Heart sounds: Normal heart sounds.  Pulmonary:     Effort: Pulmonary effort is normal. No respiratory distress.     Breath sounds: Normal breath sounds.  Abdominal:     General: Bowel sounds are normal.     Palpations: Abdomen is soft. There is no mass.     Tenderness: There is no abdominal tenderness.  Genitourinary:    Comments: Declines GU exam Musculoskeletal: Normal range of motion.        General: No tenderness.  Lymphadenopathy:     Cervical: No cervical adenopathy.  Skin:    General: Skin is warm and dry.     Findings: No rash.  Neurological:     Mental Status: She is alert and oriented to person, place, and time.     Cranial Nerves: No cranial nerve deficit.  Psychiatric:        Behavior: Behavior normal.     Results for orders placed or performed during the hospital encounter of 04/01/19  hCG, quantitative, pregnancy (serum - [facility])  Result Value Ref Range   hCG, Beta Chain, Quant, S 1 <5 mIU/mL  CBC  Result  Value Ref Range   WBC 5.6 4.0 - 10.5 K/uL   RBC 4.06 3.87 - 5.11 MIL/uL   Hemoglobin 10.7 (L) 12.0 - 15.0 g/dL   HCT 34.3 (L) 36.0 - 46.0 %   MCV 84.5 80.0 - 100.0 fL   MCH 26.4 26.0 - 34.0 pg   MCHC 31.2 30.0 - 36.0 g/dL   RDW 15.8 (H) 11.5 - 15.5 %   Platelets 279 150 - 400 K/uL   nRBC 0.0 0.0 - 0.2 %      Assessment & Plan:   Problem List Items Addressed This Visit    None    Visit Diagnoses    Vaginal itching    -  Primary   Declines wet prep or exam today d/t heavy menstruation. Tx with nystatin cream externally and f/u for testing if not resolving. Unscented TP only   Annual physical exam        Relevant Orders   CBC with Differential/Platelet   Comprehensive metabolic panel   Lipid Panel w/o Chol/HDL Ratio   TSH   UA/M w/rflx Culture, Routine   Screening for tuberculosis       Relevant Orders   QuantiFERON-TB Gold Plus   Need for hepatitis B vaccination       Relevant Orders   Hepatitis B vaccine adult IM (Completed)   Need for influenza vaccination       Relevant Orders   Flu Vaccine QUAD 6+ mos PF IM (Fluarix Quad PF) (Completed)       Follow up plan: Return in about 1 year (around 04/20/2020) for CPE.   LABORATORY TESTING:  - Pap smear: up to date  IMMUNIZATIONS:   - Tdap: Tetanus vaccination status reviewed: last tetanus booster within 10 years. - Influenza: Administered today  PATIENT COUNSELING:   Advised to take 1 mg of folate supplement per day if capable of pregnancy.   Sexuality: Discussed sexually transmitted diseases, partner selection, use of condoms, avoidance of unintended pregnancy  and contraceptive alternatives.   Advised to avoid cigarette smoking.  I discussed with the patient that most people either abstain from alcohol or drink within safe limits (<=14/week and <=4 drinks/occasion for males, <=7/weeks and <= 3 drinks/occasion for females) and that the risk for alcohol disorders and other health effects rises proportionally with the number of drinks per week and how often a drinker exceeds daily limits.  Discussed cessation/primary prevention of drug use and availability of treatment for abuse.   Diet: Encouraged to adjust caloric intake to maintain  or achieve ideal body weight, to reduce intake of dietary saturated fat and total fat, to limit sodium intake by avoiding high sodium foods and not adding table salt, and to maintain adequate dietary potassium and calcium preferably from fresh fruits, vegetables, and low-fat dairy products.    stressed the importance of regular exercise  Injury prevention: Discussed safety belts, safety  helmets, smoke detector, smoking near bedding or upholstery.   Dental health: Discussed importance of regular tooth brushing, flossing, and dental visits.    NEXT PREVENTATIVE PHYSICAL DUE IN 1 YEAR. Return in about 1 year (around 04/20/2020) for CPE.

## 2019-04-21 NOTE — Patient Instructions (Signed)

## 2019-04-22 ENCOUNTER — Encounter: Payer: Self-pay | Admitting: General Surgery

## 2019-04-22 ENCOUNTER — Ambulatory Visit (INDEPENDENT_AMBULATORY_CARE_PROVIDER_SITE_OTHER): Payer: Self-pay | Admitting: General Surgery

## 2019-04-22 VITALS — BP 114/79 | HR 87 | Temp 97.1°F | Resp 16 | Ht 65.5 in | Wt 223.0 lb

## 2019-04-22 DIAGNOSIS — K439 Ventral hernia without obstruction or gangrene: Secondary | ICD-10-CM

## 2019-04-22 DIAGNOSIS — K429 Umbilical hernia without obstruction or gangrene: Secondary | ICD-10-CM

## 2019-04-22 NOTE — Patient Instructions (Signed)
Do not lift > 10 lbs, perform excessive bending, pushing, pulling, squatting for 6-8 weeks after surgery.  Diet as tolerated. Call with any questions or concerns.

## 2019-04-22 NOTE — Progress Notes (Signed)
[facility] Note   HPI:  28 y.o. Female presents to clinic for post-op follow-up evaluation after her umbilical hernia repair with mesh. Patient reports she is doing well. She has some swelling at times and some pain /pulling. She otherwise is eating and drinking and having Bms.   Review of Systems:  No fever or chills No drainage from wound  All other review of systems: otherwise negative   Vital Signs:  BP 114/79 (BP Location: Left Arm, Patient Position: Sitting, Cuff Size: Normal)   Pulse 87   Temp (!) 97.1 F (36.2 C) (Tympanic)   Resp 16   Ht 5' 5.5" (1.664 m)   Wt 223 lb (101.2 kg)   SpO2 98%   BMI 36.54 kg/m    Physical Exam:  Physical Exam Vitals signs reviewed.  HENT:     Head: Normocephalic.  Cardiovascular:     Rate and Rhythm: Normal rate.  Pulmonary:     Effort: Pulmonary effort is normal.  Abdominal:     General: There is no distension.     Palpations: Abdomen is soft.     Tenderness: There is no abdominal tenderness.     Comments: Healing supraumbilical incision, no drainage or erythema, dermabond peeling, no hernia defect, some tenderness/ induration      Assessment:  28 y.o. yo Female with a healing umbilical hernia repair with mesh. Doing well.  Plan:  Do not lift > 10 lbs, perform excessive bending, pushing, pulling, squatting for 6-8 weeks after surgery.  Diet as tolerated. Call with any questions or concerns.   All of the above recommendations were discussed with the patient, and all of patient's questions were answered to her expressed satisfaction.  Curlene Labrum, MD [facility] [address] [place], [place] [address] [phone] (office)

## 2019-04-23 LAB — QUANTIFERON-TB GOLD PLUS
QuantiFERON Mitogen Value: >10 IU/mL
QuantiFERON Nil Value: 0.09 IU/mL
QuantiFERON TB1 Ag Value: 0.09 IU/mL
QuantiFERON TB2 Ag Value: 0.11 IU/mL
QuantiFERON-TB Gold Plus: NEGATIVE

## 2019-04-23 LAB — CBC WITH DIFFERENTIAL/PLATELET
Basophils Absolute: 0 10*3/uL (ref 0.0–0.2)
Basos: 0 %
EOS (ABSOLUTE): 0.1 10*3/uL (ref 0.0–0.4)
Eos: 3 %
Hematocrit: 32.7 % — ABNORMAL LOW (ref 34.0–46.6)
Hemoglobin: 10.3 g/dL — ABNORMAL LOW (ref 11.1–15.9)
Immature Grans (Abs): 0 10*3/uL (ref 0.0–0.1)
Immature Granulocytes: 0 %
Lymphocytes Absolute: 1.9 10*3/uL (ref 0.7–3.1)
Lymphs: 35 %
MCH: 25.9 pg — ABNORMAL LOW (ref 26.6–33.0)
MCHC: 31.5 g/dL (ref 31.5–35.7)
MCV: 82 fL (ref 79–97)
Monocytes Absolute: 0.4 10*3/uL (ref 0.1–0.9)
Monocytes: 7 %
Neutrophils Absolute: 2.9 10*3/uL (ref 1.4–7.0)
Neutrophils: 55 %
Platelets: 297 10*3/uL (ref 150–450)
RBC: 3.97 x10E6/uL (ref 3.77–5.28)
RDW: 15.3 % (ref 11.7–15.4)
WBC: 5.3 10*3/uL (ref 3.4–10.8)

## 2019-04-23 LAB — LIPID PANEL W/O CHOL/HDL RATIO
Cholesterol, Total: 204 mg/dL — ABNORMAL HIGH (ref 100–199)
HDL: 36 mg/dL — ABNORMAL LOW (ref >39)
LDL Chol Calc (NIH): 147 mg/dL — ABNORMAL HIGH (ref 0–99)
Triglycerides: 113 mg/dL (ref 0–149)
VLDL Cholesterol Cal: 21 mg/dL (ref 5–40)

## 2019-04-23 LAB — TSH: TSH: 3.54 u[IU]/mL (ref 0.450–4.500)

## 2019-04-23 LAB — COMPREHENSIVE METABOLIC PANEL
ALT: 17 IU/L (ref 0–32)
AST: 14 IU/L (ref 0–40)
Albumin/Globulin Ratio: 1.5 (ref 1.2–2.2)
Albumin: 4.2 g/dL (ref 3.9–5.0)
Alkaline Phosphatase: 98 IU/L (ref 39–117)
BUN/Creatinine Ratio: 13 (ref 9–23)
BUN: 9 mg/dL (ref 6–20)
Bilirubin Total: 0.3 mg/dL (ref 0.0–1.2)
CO2: 22 mmol/L (ref 20–29)
Calcium: 9.4 mg/dL (ref 8.7–10.2)
Chloride: 103 mmol/L (ref 96–106)
Creatinine, Ser: 0.72 mg/dL (ref 0.57–1.00)
GFR calc Af Amer: 130 mL/min/{1.73_m2} (ref >59)
GFR calc non Af Amer: 113 mL/min/{1.73_m2} (ref >59)
Globulin, Total: 2.8 g/dL (ref 1.5–4.5)
Glucose: 90 mg/dL (ref 65–99)
Potassium: 4.4 mmol/L (ref 3.5–5.2)
Sodium: 139 mmol/L (ref 134–144)
Total Protein: 7 g/dL (ref 6.0–8.5)

## 2019-04-25 ENCOUNTER — Ambulatory Visit: Payer: No Typology Code available for payment source | Admitting: Family Medicine

## 2019-04-28 DIAGNOSIS — Z0184 Encounter for antibody response examination: Secondary | ICD-10-CM

## 2019-04-29 NOTE — Telephone Encounter (Signed)
Emily Morales,   Could you enter a Hep B Titer future order for patient?

## 2019-04-29 NOTE — Telephone Encounter (Signed)
Order placed

## 2019-04-30 ENCOUNTER — Encounter: Payer: Self-pay | Admitting: General Surgery

## 2019-05-05 ENCOUNTER — Other Ambulatory Visit: Payer: Self-pay

## 2019-05-06 ENCOUNTER — Telehealth: Payer: Self-pay | Admitting: Obstetrics & Gynecology

## 2019-05-06 MED ORDER — NORELGESTROMIN-ETH ESTRADIOL 150-35 MCG/24HR TD PTWK: 1.0000 | MEDICATED_PATCH | TRANSDERMAL | 12 refills | Status: DC

## 2019-05-06 NOTE — Telephone Encounter (Signed)
Had CPE 04/21/19.

## 2019-05-07 ENCOUNTER — Other Ambulatory Visit: Payer: Self-pay

## 2019-05-08 ENCOUNTER — Other Ambulatory Visit (HOSPITAL_COMMUNITY): Payer: Self-pay | Admitting: Psychiatry

## 2019-05-08 ENCOUNTER — Telehealth (HOSPITAL_COMMUNITY): Payer: Self-pay | Admitting: *Deleted

## 2019-05-08 MED ORDER — QUETIAPINE FUMARATE 100 MG PO TABS: 100.0000 mg | ORAL_TABLET | Freq: Every day | ORAL | 0 refills | Status: DC

## 2019-05-08 NOTE — Telephone Encounter (Signed)
Had CPE 04/21/19.

## 2019-05-08 NOTE — Telephone Encounter (Signed)
[name] stating she needs a refill for Seroquel been out  X 3 3 days. Didn't know you were now managing refill request PCP was doing. Office visit 04-18-2019 you decreased to  100 mg from  150 mg. Next appt. 05-23-2019

## 2019-05-08 NOTE — Telephone Encounter (Signed)
Ordered

## 2019-05-14 NOTE — Telephone Encounter (Signed)
Entered accidentally

## 2019-05-16 NOTE — Progress Notes (Deleted)
BH MD/PA/NP OP Progress Note  05/16/2019 11:00 AM Emily Morales  MRN:  [id number]  Chief Complaint:  HPI:   Umbilical hernia with mesh  Visit Diagnosis: No diagnosis found.  Past Psychiatric History: Please see initial evaluation for full details. I have reviewed the history. No updates at this time.     Past Medical History:  Past Medical History:  Diagnosis Date  . Abnormal Pap smear   . Anemia   . Anxiety   . Arthritis   . Chronic back pain   . Depression   . Dysrhythmia    irregular heartrate  . GERD (gastroesophageal reflux disease)   . Headache(784.0)   . Hx of chlamydia infection   . Ovarian cyst, right   . Perimenopausal   . Uterine fibroid     Past Surgical History:  Procedure Laterality Date  . BIOPSY  11/14/2016   Procedure: BIOPSY;  Surgeon: Danie Binder, MD;  Location: [facility];  Service: Endoscopy;;  duodenal gastric  . COLPOSCOPY    . ESOPHAGOGASTRODUODENOSCOPY (EGD) WITH PROPOFOL N/A 11/14/2016   Dr. Oneida Alar: Mild gastritis, normal esophagus, biopsy showed inflammation of the small bowel, gastric biopsies with no H. pylori.  . lipoma surgery    . MASS EXCISION N/A 02/28/2017   Procedure: EXCISION LIPOMA SCALP 3CM;  Surgeon: Aviva Signs, MD;  Location: [facility] ORS;  Service: General;  Laterality: N/A;  . UMBILICAL HERNIA REPAIR N/A 04/04/2019   Procedure: HERNIA REPAIR UMBILICAL ADULT WITH MESH;  Surgeon: Virl Cagey, MD;  Location: [facility] ORS;  Service: General;  Laterality: N/A;    Family Psychiatric History: Please see initial evaluation for full details. I have reviewed the history. No updates at this time.     Family History:  Family History  Problem Relation Age of Onset  . Diabetes Father   . Hypertension Father   . Other Son        reactive airway disease  . Asthma Son   . Heart murmur Son   . Depression Mother   . Anxiety disorder Mother   . Cancer Maternal Grandmother        lung  . Lung cancer Maternal Grandmother   .  Heart disease Paternal Grandmother   . Hypertension Paternal Grandmother   . Liver disease Paternal Grandfather        etoh related  . Alcohol abuse Maternal Grandfather   . Colon cancer Neg Hx     Social History:  Social History   Socioeconomic History  . Marital status: Married    Spouse name: Not on file  . Number of children: 2  . Years of education: Not on file  . Highest education level: Not on file  Occupational History  . Occupation: clinical care coordinator    Comment: [employer]  Social Needs  . Financial resource strain: Not on file  . Food insecurity    Worry: Not on file    Inability: Not on file  . Transportation needs    Medical: Not on file    Non-medical: Not on file  Tobacco Use  . Smoking status: Former Smoker    Types: Cigarettes  . Smokeless tobacco: Never Used  Substance and Sexual Activity  . Alcohol use: Yes  . Drug use: Yes    Types: Other-see comments    Comment: CBD drops  . Sexual activity: Yes    Partners: Male    Birth control/protection: None  Lifestyle  . Physical activity  Days per week: Not on file    Minutes per session: Not on file  . Stress: Not on file  Relationships  . Social Herbalist on phone: Not on file    Gets together: Not on file    Attends religious service: Not on file    Active member of club or organization: Not on file    Attends meetings of clubs or organizations: Not on file    Relationship status: Not on file  Other Topics Concern  . Not on file  Social History Narrative  . Not on file    Allergies:  Allergies  Allergen Reactions  . Pollen Extract Cough  . Latex Rash  . Other Rash    Coconut causes a rash    Metabolic Disorder Labs: No results found for: HGBA1C, MPG No results found for: PROLACTIN Lab Results  Component Value Date   CHOL 204 (H) 04/21/2019   TRIG 113 04/21/2019   HDL 36 (L) 04/21/2019   LDLCALC 147 (H) 04/21/2019   Lab Results  Component Value  Date   TSH 3.540 04/21/2019   TSH 2.690 05/02/2018    Therapeutic Level Labs: No results found for: LITHIUM No results found for: VALPROATE No components found for:  CBMZ  Current Medications: Current Outpatient Medications  Medication Sig Dispense Refill  . docusate sodium (COLACE) 100 MG capsule Take 1 capsule (100 mg total) by mouth 2 (two) times daily. 60 capsule 2  . fexofenadine (ALLEGRA ALLERGY) 180 MG tablet Take 1 tablet (180 mg total) by mouth daily. 30 tablet 3  . FLUoxetine (PROZAC) 20 MG capsule Take 1 capsule (20 mg total) by mouth daily. 30 capsule 0  . megestrol (MEGACE) 40 MG tablet 3 tablets a day for 5 days, 2 tablets a day for 5 days then 1 tablet daily (Patient taking differently: Take 40 mg by mouth daily as needed (bleeding). ) 45 tablet 3  . norelgestromin-ethinyl estradiol (ORTHO EVRA) 150-35 MCG/24HR transdermal patch Place 1 patch onto the skin once a week. 3 patch 12  . nystatin cream (MYCOSTATIN) Apply 1 application topically 2 (two) times daily. 60 g 0  . omeprazole (PRILOSEC) 40 MG capsule Take 1 capsule (40 mg total) by mouth daily before lunch. 90 capsule 3  . QUEtiapine (SEROQUEL) 100 MG tablet Take 1 tablet (100 mg total) by mouth at bedtime. 30 tablet 0  . SUMAtriptan (IMITREX) 50 MG tablet Take 1 tab at onset of migraine. May repeat in 2 hours if headache persists or recurs. 10 tablet 0   No current facility-administered medications for this visit.      Musculoskeletal: Strength & Muscle Tone: N/A Gait & Station: N/A Patient leans: N/A  Psychiatric Specialty Exam: ROS  There were no vitals taken for this visit.There is no height or weight on file to calculate BMI.  General Appearance: {Appearance:22683}  Eye Contact:  {BHH EYE CONTACT:22684}  Speech:  Clear and Coherent  Volume:  Normal  Mood:  {BHH MOOD:22306}  Affect:  {Affect (PAA):22687}  Thought Process:  Coherent  Orientation:  Full (Time, Place, and Person)  Thought Content:  Logical   Suicidal Thoughts:  {ST/HT (PAA):22692}  Homicidal Thoughts:  {ST/HT (PAA):22692}  Memory:  Immediate;   Good  Judgement:  {Judgement (PAA):22694}  Insight:  {Insight (PAA):22695}  Psychomotor Activity:  Normal  Concentration:  Concentration: Good and Attention Span: Good  Recall:  Good  Fund of Knowledge: Good  Language: Good  Akathisia:  No  Handed:  Right  AIMS (if indicated): not done  Assets:  Communication Skills Desire for Improvement  ADL's:  Intact  Cognition: WNL  Sleep:  {BHH GOOD/FAIR/POOR:22877}   Screenings: GAD-7     Video Visit from 12/13/2018 in [facility] Visit from 08/15/2018 in [facility]  Total GAD-7 Score  12  15    PHQ2-9     Office Visit from 04/21/2019 in [facility] Video Visit from 12/13/2018 in [facility] Visit from 08/15/2018 in [facility]  PHQ-2 Total Score  2  4  4   PHQ-9 Total Score  9  11  16        Assessment and Plan:  Emily Morales is a 28 y.o. year old female with a history of depression, anxiety, migraine, who presents for follow up appointment for No diagnosis found.  # MDD, mild,  recurrent without psychotic features # Panic disorder  Patient reports depressive symptoms and panic attacks since the last visit.  Psychosocial stressors includes marital conflict, although she reports improvement in the relationship with her husband. She self discontinued fluoxetine without any side effect.  Also noted that she did have a trial of antidepressants in the past, which she was on only for short period and low-dose.  Provided psychoeducation of adherence to medication.  Will retry fluoxetine to target depression and panic attacks.  Discussed risk of serotonin syndrome in the context of using Imitrex.  Will lower the dose of quetiapine given she has had weight gain (uptitrated by PCP); will use this medication as adjunctive treatment for depression and  anxiety.  Discussed potential metabolic side effect. She will greatly benefit from CBT; will make a referral.   Plan 1. Reinitiate fluoxetine 20 mg daily  2. Decrease quetiapine 100 mg at night (PCP increased to 150 mg since the last visit)  3. Referral to therapy 4. Next appointment: 10/23 at 9 AM for 30 mins, video - discussed attendance policy  Past trials of medication:sertraline (sexual side effect, tried 50mg ), fluoxetine (10mg ), citalopram, escitalopram, bupropion (150 mg), Trintellix (sexual side effect), venlafaxine, Buspar, quetiapine, clonazepam  The patient demonstrates the following risk factors for suicide: Chronic risk factors for suicide include:psychiatric disorder ofdepression, anxiety. Acute risk factorsfor suicide include: family or marital conflict. Protective factorsfor this patient include: positive social support, responsibility to others (children, family), coping skills and hope for the future. Considering these factors, the overall suicide risk at this point appears to below. Patientisappropriate for outpatient follow up.  Norman Clay, MD 05/16/2019, 11:00 AM

## 2019-05-19 ENCOUNTER — Telehealth: Payer: No Typology Code available for payment source | Admitting: Family

## 2019-05-19 ENCOUNTER — Telehealth: Payer: Self-pay

## 2019-05-19 ENCOUNTER — Encounter (INDEPENDENT_AMBULATORY_CARE_PROVIDER_SITE_OTHER): Payer: Self-pay

## 2019-05-19 DIAGNOSIS — Z20828 Contact with and (suspected) exposure to other viral communicable diseases: Secondary | ICD-10-CM

## 2019-05-19 DIAGNOSIS — Z20822 Contact with and (suspected) exposure to covid-19: Secondary | ICD-10-CM

## 2019-05-19 NOTE — Telephone Encounter (Signed)
Patient advise on cough per protocol:  If cough remains the same or better: continue to treat with over the counter medications. Hard candy or cough drops and drinking warm fluids. Adults can also use honey 2 tsp (10 ML) at bedtime.   HONEY IS NOT RECOMMENDED FOR INFANTS UNDER ONE.  If cough is becoming worse even with the use of over the counter medications and patient is not able to sleep at night, cough becomes productive with sputum that maybe yellow or green in color, contact PCP.  Patient states that the cough has been worse since Friday. Patient will continue to monitor symptom. Patient verbalized understanding and agrees with plan.

## 2019-05-19 NOTE — Progress Notes (Signed)
E-Visit for Corona Virus Screening   Your current symptoms could be consistent with the coronavirus.  Many health care providers can now test patients at their office but not all are.  [facility] has multiple testing sites. For information on our COVID testing locations and hours go to [email or web address]  Please quarantine yourself while awaiting your test results.  We are enrolling you in our [place] for [place] . Daily you will receive a questionnaire within the [facility] website. Our COVID 19 response team willl be monitoriing your responses daily.  You can go to one of the  testing sites listed below, while they are opened (see hours). You do not need an order and will stay in your car during the test. You do need to self isolate until your results return and if positive 14 days from when your symptoms started and until you are 3 days symptom free.   Testing Locations (Monday - Friday, 8 a.m. - 3:30 p.m.) . [place]: [facility] at [facility], [address] [place], [place]: [place], [place], [place], [place] (entrance off [address])  . [facility]: (Closed each Monday): Testing site relocated to the short stay covered drive at [facility]. (Use the [facility] entrance to [facility] next to [place] is a respiratory illness with symptoms that are similar to the flu. Symptoms are typically mild to moderate, but there have been cases of severe illness and death due to the virus. The following symptoms may appear 2-14 days after exposure: . Fever . Cough . Shortness of breath or difficulty breathing . Chills . Repeated shaking with chills . Muscle pain . Headache . Sore throat . New loss of taste or smell . Fatigue . Congestion or runny nose . Nausea or vomiting . Diarrhea  It is vitally important that if you feel that you  have an infection such as this virus or any other virus that you stay home and away from places where you may spread it to others.  You should self-quarantine for 14 days if you have symptoms that could potentially be coronavirus or have been in close contact a with a person diagnosed with COVID-19 within the last 2 weeks. You should avoid contact with people age 69 and older.   You should wear a mask or cloth face covering over your nose and mouth if you must be around other people or animals, including pets (even at home). Try to stay at least 6 feet away from other people. This will protect the people around you.   You may also take acetaminophen (Tylenol) as needed for fever.   Reduce your risk of any infection by using the same precautions used for avoiding the common cold or flu:  [place] Kitchen Wash your hands often with soap and warm water for at least 20 seconds.  If soap and water are not readily available, use an alcohol-based hand sanitizer with at least 60% alcohol.  . If coughing or sneezing, cover your mouth and nose by coughing or sneezing into the elbow areas of your shirt or coat, into a tissue or into your sleeve (not your hands). . Avoid shaking hands with others and consider head nods or verbal greetings only. . Avoid touching your eyes, nose, or mouth with unwashed hands.  . Avoid close contact with people who are sick. . Avoid places or events with large numbers of  people in one location, like concerts or sporting events. . Carefully consider travel plans you have or are making. . If you are planning any travel outside or inside the [place], visit the CDC's Travelers' Health webpage for the latest health notices. . If you have some symptoms but not all symptoms, continue to monitor at home and seek medical attention if your symptoms worsen. . If you are having a medical emergency, call 911.  HOME CARE . Only take medications as instructed by your medical team. . Drink plenty of fluids and  get plenty of rest. . A steam or ultrasonic humidifier can help if you have congestion.   GET HELP RIGHT AWAY IF YOU HAVE EMERGENCY WARNING SIGNS** FOR COVID-19. If you or someone is showing any of these signs seek emergency medical care immediately. Call 911 or proceed to your closest emergency facility if: . You develop worsening high fever. . Trouble breathing . Bluish lips or face . Persistent pain or pressure in the chest . New confusion . Inability to wake or stay awake . You cough up blood. . Your symptoms become more severe  **This list is not all possible symptoms. Contact your medical provider for any symptoms that are sever or concerning to you.   MAKE SURE YOU   Understand these instructions.  Will watch your condition.  Will get help right away if you are not doing well or get worse.  Your e-visit answers were reviewed by a board certified advanced clinical practitioner to complete your personal care plan.  Depending on the condition, your plan could have included both over the counter or prescription medications.  If there is a problem please reply once you have received a response from your provider.  Your safety is important to [place].  If you have drug allergies check your prescription carefully.    You can use MyChart to ask questions about today's visit, request a non-urgent call back, or ask for a work or school excuse for 24 hours related to this e-Visit. If it has been greater than 24 hours you will need to follow up with your provider, or enter a new e-Visit to address those concerns. You will get an e-mail in the next two days asking about your experience.  I hope that your e-visit has been valuable and will speed your recovery. Thank you for using e-visits.   Approximately 5 minutes was spent documenting and reviewing patient's chart.

## 2019-05-20 ENCOUNTER — Encounter (INDEPENDENT_AMBULATORY_CARE_PROVIDER_SITE_OTHER): Payer: Self-pay

## 2019-05-21 ENCOUNTER — Encounter (INDEPENDENT_AMBULATORY_CARE_PROVIDER_SITE_OTHER): Payer: Self-pay

## 2019-05-23 ENCOUNTER — Telehealth (HOSPITAL_COMMUNITY): Payer: Self-pay | Admitting: Psychiatry

## 2019-05-23 ENCOUNTER — Other Ambulatory Visit: Payer: Self-pay

## 2019-05-23 ENCOUNTER — Ambulatory Visit (HOSPITAL_COMMUNITY): Payer: No Typology Code available for payment source | Admitting: Psychiatry

## 2019-05-23 NOTE — Telephone Encounter (Signed)
Sent link for video visit through [employer]. Patient did not sign in. Called the patient  twice for appointment scheduled today. The patient did not answer the phone. Left voice message to contact the office.  

## 2019-05-26 ENCOUNTER — Encounter (INDEPENDENT_AMBULATORY_CARE_PROVIDER_SITE_OTHER): Payer: Self-pay

## 2019-05-27 ENCOUNTER — Encounter (INDEPENDENT_AMBULATORY_CARE_PROVIDER_SITE_OTHER): Payer: Self-pay

## 2019-05-28 ENCOUNTER — Encounter (INDEPENDENT_AMBULATORY_CARE_PROVIDER_SITE_OTHER): Payer: Self-pay

## 2019-05-29 ENCOUNTER — Ambulatory Visit: Payer: No Typology Code available for payment source | Admitting: Family Medicine

## 2019-05-29 ENCOUNTER — Encounter (INDEPENDENT_AMBULATORY_CARE_PROVIDER_SITE_OTHER): Payer: Self-pay

## 2019-05-30 ENCOUNTER — Ambulatory Visit: Payer: Self-pay | Admitting: Family Medicine

## 2019-05-30 ENCOUNTER — Telehealth (HOSPITAL_COMMUNITY): Payer: Self-pay | Admitting: Psychiatry

## 2019-05-30 ENCOUNTER — Encounter: Payer: Self-pay | Admitting: Family Medicine

## 2019-05-30 ENCOUNTER — Other Ambulatory Visit: Payer: No Typology Code available for payment source

## 2019-05-30 ENCOUNTER — Other Ambulatory Visit: Payer: Self-pay

## 2019-05-30 ENCOUNTER — Encounter (INDEPENDENT_AMBULATORY_CARE_PROVIDER_SITE_OTHER): Payer: Self-pay

## 2019-05-30 ENCOUNTER — Ambulatory Visit (INDEPENDENT_AMBULATORY_CARE_PROVIDER_SITE_OTHER): Payer: No Typology Code available for payment source | Admitting: Family Medicine

## 2019-05-30 DIAGNOSIS — J069 Acute upper respiratory infection, unspecified: Secondary | ICD-10-CM | POA: Diagnosis not present

## 2019-05-30 MED ORDER — QUETIAPINE FUMARATE 100 MG PO TABS: 100.0000 mg | ORAL_TABLET | Freq: Every day | ORAL | 1 refills | Status: DC

## 2019-05-30 MED ORDER — MONTELUKAST SODIUM 10 MG PO TABS: 10.0000 mg | ORAL_TABLET | Freq: Every day | ORAL | 3 refills | Status: DC

## 2019-05-30 MED ORDER — PREDNISONE 20 MG PO TABS: 40.0000 mg | ORAL_TABLET | Freq: Every day | ORAL | 0 refills | Status: DC

## 2019-05-30 NOTE — Telephone Encounter (Signed)
Ordered quetiapine refill per request. Please contact the patient and make follow up by early Dec. Please also  discuss attendance policy.

## 2019-05-30 NOTE — Progress Notes (Signed)
There were no vitals taken for this visit.   Subjective:    Patient ID: Emily Morales, female    DOB: 1991-02-06, 28 y.o.   MRN: [id number]  HPI: Emily Morales is a 28 y.o. female  Chief Complaint  Patient presents with  . Cough    runny nose    . This visit was completed via WebEx due to the restrictions of the COVID-19 pandemic. All issues as above were discussed and addressed. Physical exam was done as above through visual confirmation on WebEx. If it was felt that the patient should be evaluated in the office, they were directed there. The patient verbally consented to this visit. . Location of the patient: home . Location of the provider: work . Those involved with this call:  . Provider: Merrie Roof, PA-C . CMA: Lesle Chris, [place] . Front Desk/Registration: Jill Side  . Time spent on call: 15 minutes with patient face to face via video conference. More than 50% of this time was spent in counseling and coordination of care. 5 minutes total spent in review of patient's record and preparation of their chart. I verified patient identity using two factors (patient name and date of birth). Patient consents verbally to being seen via telemedicine visit today.   Cough intermittently for about 3 weeks, clear rhinorrhea, sore throat initially but that's gone away now. Trying OTC cold medications, cough drops, hot tea with honey, tessalon perles with mild relief. No fevers, chills, body aches, CP, SOB, wheezing. Does have a hx of allergic rhinitis but stopped her allegra a while back because it didn't seem to help. No COVID exposures. Her child was sick recently but that was thought to be asthma/allergy related and they have since improved.   Relevant past medical, surgical, family and social history reviewed and updated as indicated. Interim medical history since our last visit reviewed. Allergies and medications reviewed and updated.  Review of Systems  Per HPI unless  specifically indicated above     Objective:    There were no vitals taken for this visit.  Wt Readings from Last 3 Encounters:  04/22/19 223 lb (101.2 kg)  04/21/19 222 lb (100.7 kg)  04/01/19 217 lb (98.4 kg)    Physical Exam Vitals signs and nursing note reviewed.  Constitutional:      General: She is not in acute distress.    Appearance: Normal appearance.  HENT:     Head: Atraumatic.     Right Ear: External ear normal.     Left Ear: External ear normal.     Nose: Rhinorrhea present. No congestion.     Mouth/Throat:     Mouth: Mucous membranes are moist.     Pharynx: Oropharynx is clear. Posterior oropharyngeal erythema present.  Eyes:     Extraocular Movements: Extraocular movements intact.     Conjunctiva/sclera: Conjunctivae normal.  Neck:     Musculoskeletal: Normal range of motion.  Cardiovascular:     Comments: Unable to assess via virtual visit Pulmonary:     Effort: Pulmonary effort is normal. No respiratory distress.  Musculoskeletal: Normal range of motion.  Skin:    General: Skin is dry.     Findings: No erythema.  Neurological:     Mental Status: She is alert and oriented to person, place, and time.  Psychiatric:        Mood and Affect: Mood normal.        Thought Content: Thought content normal.  Judgment: Judgment normal.     Results for orders placed or performed in visit on 04/21/19  Microscopic Examination   URINE  Result Value Ref Range   WBC, UA 0-5 0 - 5 /hpf   RBC >30 (A) 0 - 2 /hpf   Epithelial Cells (non renal) 0-10 0 - 10 /hpf   Mucus, UA Present Not Estab.   Bacteria, UA Few (A) None seen/Few  CBC with Differential/Platelet  Result Value Ref Range   WBC 5.3 3.4 - 10.8 x10E3/uL   RBC 3.97 3.77 - 5.28 x10E6/uL   Hemoglobin 10.3 (L) 11.1 - 15.9 g/dL   Hematocrit 32.7 (L) 34.0 - 46.6 %   MCV 82 79 - 97 fL   MCH 25.9 (L) 26.6 - 33.0 pg   MCHC 31.5 31.5 - 35.7 g/dL   RDW 15.3 11.7 - 15.4 %   Platelets 297 150 - 450 x10E3/uL    Neutrophils 55 Not Estab. %   Lymphs 35 Not Estab. %   Monocytes 7 Not Estab. %   Eos 3 Not Estab. %   Basos 0 Not Estab. %   Neutrophils Absolute 2.9 1.4 - 7.0 x10E3/uL   Lymphocytes Absolute 1.9 0.7 - 3.1 x10E3/uL   Monocytes Absolute 0.4 0.1 - 0.9 x10E3/uL   EOS (ABSOLUTE) 0.1 0.0 - 0.4 x10E3/uL   Basophils Absolute 0.0 0.0 - 0.2 x10E3/uL   Immature Granulocytes 0 Not Estab. %   Immature Grans (Abs) 0.0 0.0 - 0.1 x10E3/uL  Comprehensive metabolic panel  Result Value Ref Range   Glucose 90 65 - 99 mg/dL   BUN 9 6 - 20 mg/dL   Creatinine, Ser 0.72 0.57 - 1.00 mg/dL   GFR calc non Af Amer 113 >59 mL/min/1.73   GFR calc Af Amer 130 >59 mL/min/1.73   BUN/Creatinine Ratio 13 9 - 23   Sodium 139 134 - 144 mmol/L   Potassium 4.4 3.5 - 5.2 mmol/L   Chloride 103 96 - 106 mmol/L   CO2 22 20 - 29 mmol/L   Calcium 9.4 8.7 - 10.2 mg/dL   Total Protein 7.0 6.0 - 8.5 g/dL   Albumin 4.2 3.9 - 5.0 g/dL   Globulin, Total 2.8 1.5 - 4.5 g/dL   Albumin/Globulin Ratio 1.5 1.2 - 2.2   Bilirubin Total 0.3 0.0 - 1.2 mg/dL   Alkaline Phosphatase 98 39 - 117 IU/L   AST 14 0 - 40 IU/L   ALT 17 0 - 32 IU/L  Lipid Panel w/o Chol/HDL Ratio  Result Value Ref Range   Cholesterol, Total 204 (H) 100 - 199 mg/dL   Triglycerides 113 0 - 149 mg/dL   HDL 36 (L) >39 mg/dL   VLDL Cholesterol Cal 21 5 - 40 mg/dL   LDL Chol Calc (NIH) 147 (H) 0 - 99 mg/dL  TSH  Result Value Ref Range   TSH 3.540 0.450 - 4.500 uIU/mL  UA/M w/rflx Culture, Routine   Specimen: Urine   URINE  Result Value Ref Range   Specific Gravity, UA 1.025 1.005 - 1.030   pH, UA 5.0 5.0 - 7.5   Color, UA Yellow Yellow   Appearance Ur Hazy (A) Clear   Leukocytes,UA Negative Negative   Protein,UA Trace (A) Negative/Trace   Glucose, UA Negative Negative   Ketones, UA Negative Negative   RBC, UA 3+ (A) Negative   Bilirubin, UA Negative Negative   Urobilinogen, Ur 0.2 0.2 - 1.0 mg/dL   Nitrite, UA Negative Negative   Microscopic  Examination  See below:   QuantiFERON-TB Gold Plus  Result Value Ref Range   QuantiFERON Incubation Incubation performed.    QuantiFERON Criteria Comment    QuantiFERON TB1 Ag Value 0.09 IU/mL   QuantiFERON TB2 Ag Value 0.11 IU/mL   QuantiFERON Nil Value 0.09 IU/mL   QuantiFERON Mitogen Value >10.00 IU/mL   QuantiFERON-TB Gold Plus Negative Negative      Assessment & Plan:   Problem List Items Addressed This Visit    None    Visit Diagnoses    Viral URI with cough    -  Primary   Allergic vs viral, tx with prednisone, singulair, flonase OTC. Supportive care and return precautions given. Low suspicion for COVID given timeframe of 3 wks       Follow up plan: Return if symptoms worsen or fail to improve.

## 2019-06-04 NOTE — Telephone Encounter (Signed)
LVM PER PROVIDER:Ordered quetiapine refill per request. Please contact the patient and make follow up by early Dec. Please also  discuss attendance policy.

## 2019-06-05 ENCOUNTER — Other Ambulatory Visit: Payer: Self-pay

## 2019-06-05 ENCOUNTER — Encounter: Payer: Self-pay | Admitting: Family Medicine

## 2019-06-05 ENCOUNTER — Ambulatory Visit (INDEPENDENT_AMBULATORY_CARE_PROVIDER_SITE_OTHER): Payer: No Typology Code available for payment source | Admitting: Family Medicine

## 2019-06-05 VITALS — BP 129/76 | HR 80 | Temp 98.8°F | Ht 65.0 in | Wt 224.0 lb

## 2019-06-05 DIAGNOSIS — Z0184 Encounter for antibody response examination: Secondary | ICD-10-CM | POA: Diagnosis not present

## 2019-06-05 DIAGNOSIS — R195 Other fecal abnormalities: Secondary | ICD-10-CM

## 2019-06-05 DIAGNOSIS — R1013 Epigastric pain: Secondary | ICD-10-CM

## 2019-06-05 LAB — CBC WITH DIFFERENTIAL/PLATELET
Hematocrit: 32.6 % — ABNORMAL LOW (ref 34.0–46.6)
Hemoglobin: 11.2 g/dL (ref 11.1–15.9)
Lymphocytes Absolute: 3.8 10*3/uL — ABNORMAL HIGH (ref 0.7–3.1)
Lymphs: 40 %
MCH: 27.6 pg (ref 26.6–33.0)
MCHC: 34.4 g/dL (ref 31.5–35.7)
MCV: 80 fL (ref 79–97)
MID (Absolute): 0.3 10*3/uL (ref 0.1–1.6)
MID: 3 %
Neutrophils Absolute: 5.5 10*3/uL (ref 1.4–7.0)
Neutrophils: 57 %
Platelets: 309 10*3/uL (ref 150–450)
RBC: 4.06 x10E6/uL (ref 3.77–5.28)
RDW: 16.6 % — ABNORMAL HIGH (ref 11.7–15.4)
WBC: 9.6 10*3/uL (ref 3.4–10.8)

## 2019-06-05 MED ORDER — SUCRALFATE 1 G PO TABS: 1.0000 g | ORAL_TABLET | Freq: Three times a day (TID) | ORAL | 0 refills | Status: DC

## 2019-06-05 NOTE — Progress Notes (Signed)
BP 129/76   Pulse 80   Temp 98.8 F (37.1 C) (Oral)   Ht 5\' 5"  (1.651 m)   Wt 224 lb (101.6 kg)   SpO2 95%   BMI 37.28 kg/m    Subjective:    Patient ID: Emily Morales, female    DOB: [birth year], 28 y.o.   MRN: [id number]  HPI: Emily Morales is a 28 y.o. female  Chief Complaint  Patient presents with  . Abdominal Pain    middle abdomen. black stool since yesterday  . Diarrhea   Patient here today with epigastric abdominal pain, loose stools and then several episodes of black stools yesterday. Loose stools started while taking a course of prednisone last week for a cough/viral URI she had. Tried some pepto bismol with no relief and takes nexium OTC daily. Tolerating PO though doesn't have much of an appetite. No sick contacts, new foods, fevers, chills, body aches, vomiting, weakness, CP, SOB.   Relevant past medical, surgical, family and social history reviewed and updated as indicated. Interim medical history since our last visit reviewed. Allergies and medications reviewed and updated.  Review of Systems  Per HPI unless specifically indicated above     Objective:    BP 129/76   Pulse 80   Temp 98.8 F (37.1 C) (Oral)   Ht 5\' 5"  (1.651 m)   Wt 224 lb (101.6 kg)   SpO2 95%   BMI 37.28 kg/m   Wt Readings from Last 3 Encounters:  06/05/19 224 lb (101.6 kg)  04/22/19 223 lb (101.2 kg)  04/21/19 222 lb (100.7 kg)    Physical Exam Vitals signs and nursing note reviewed.  Constitutional:      Appearance: Normal appearance. She is not ill-appearing.  HENT:     Head: Atraumatic.  Eyes:     Extraocular Movements: Extraocular movements intact.     Conjunctiva/sclera: Conjunctivae normal.  Neck:     Musculoskeletal: Normal range of motion and neck supple.  Cardiovascular:     Rate and Rhythm: Normal rate and regular rhythm.     Heart sounds: Normal heart sounds.  Pulmonary:     Effort: Pulmonary effort is normal.     Breath sounds: Normal breath  sounds.  Abdominal:     General: Bowel sounds are normal. There is no distension.     Palpations: Abdomen is soft. There is no mass.     Tenderness: There is abdominal tenderness (epigastric ttp). There is no guarding or rebound.  Musculoskeletal: Normal range of motion.  Skin:    General: Skin is warm and dry.  Neurological:     Mental Status: She is alert and oriented to person, place, and time.  Psychiatric:        Mood and Affect: Mood normal.        Thought Content: Thought content normal.        Judgment: Judgment normal.     Results for orders placed or performed in visit on 06/05/19  CBC With Differential/Platelet here  Result Value Ref Range   WBC 9.6 3.4 - 10.8 x10E3/uL   RBC 4.06 3.77 - 5.28 x10E6/uL   Hemoglobin 11.2 11.1 - 15.9 g/dL   Hematocrit 32.6 (L) 34.0 - 46.6 %   MCV 80 79 - 97 fL   MCH 27.6 26.6 - 33.0 pg   MCHC 34.4 31.5 - 35.7 g/dL   RDW 16.6 (H) 11.7 - 15.4 %   Platelets 309 150 - 450 x10E3/uL  Neutrophils 57 Not Estab. %   Lymphs 40 Not Estab. %   MID 3 Not Estab. %   Neutrophils Absolute 5.5 1.4 - 7.0 x10E3/uL   Lymphocytes Absolute 3.8 (H) 0.7 - 3.1 x10E3/uL   MID (Absolute) 0.3 0.1 - 1.6 X10E3/uL  Comprehensive metabolic panel  Result Value Ref Range   Glucose 75 65 - 99 mg/dL   BUN 14 6 - 20 mg/dL   Creatinine, Ser 0.76 0.57 - 1.00 mg/dL   GFR calc non Af Amer 106 >59 mL/min/1.73   GFR calc Af Amer 122 >59 mL/min/1.73   BUN/Creatinine Ratio 18 9 - 23   Sodium 139 134 - 144 mmol/L   Potassium 4.0 3.5 - 5.2 mmol/L   Chloride 105 96 - 106 mmol/L   CO2 19 (L) 20 - 29 mmol/L   Calcium 9.2 8.7 - 10.2 mg/dL   Total Protein 7.2 6.0 - 8.5 g/dL   Albumin 4.0 3.9 - 5.0 g/dL   Globulin, Total 3.2 1.5 - 4.5 g/dL   Albumin/Globulin Ratio 1.3 1.2 - 2.2   Bilirubin Total 0.3 0.0 - 1.2 mg/dL   Alkaline Phosphatase 80 39 - 117 IU/L   AST 10 0 - 40 IU/L   ALT 30 0 - 32 IU/L  Hepatitis B Surface AntiBODY  Result Value Ref Range   Hep B Surface Ab,  Qual Reactive       Assessment & Plan:   Problem List Items Addressed This Visit    None    Visit Diagnoses    Dark stools    -  Primary   Suspect from upper GI irritation from prednisone. May also be in part from taking pepto bismol. D/c this, tx with nexium, carafate, bland diet   Relevant Orders   CBC With Differential/Platelet here (Completed)   Comprehensive metabolic panel (Completed)   Epigastric pain       Suspect from upper GI irritation from prednisone. Tx w/ carafate, increase nexium to 40 mg, BRAT diet. CBC WNL today, await other labs. Return precautions given   Immunity status testing           Follow up plan: Return if symptoms worsen or fail to improve.

## 2019-06-06 LAB — COMPREHENSIVE METABOLIC PANEL
ALT: 30 IU/L (ref 0–32)
AST: 10 IU/L (ref 0–40)
Albumin/Globulin Ratio: 1.3 (ref 1.2–2.2)
Albumin: 4 g/dL (ref 3.9–5.0)
Alkaline Phosphatase: 80 IU/L (ref 39–117)
BUN/Creatinine Ratio: 18 (ref 9–23)
BUN: 14 mg/dL (ref 6–20)
Bilirubin Total: 0.3 mg/dL (ref 0.0–1.2)
CO2: 19 mmol/L — ABNORMAL LOW (ref 20–29)
Calcium: 9.2 mg/dL (ref 8.7–10.2)
Chloride: 105 mmol/L (ref 96–106)
Creatinine, Ser: 0.76 mg/dL (ref 0.57–1.00)
GFR calc Af Amer: 122 mL/min/{1.73_m2} (ref >59)
GFR calc non Af Amer: 106 mL/min/{1.73_m2} (ref >59)
Globulin, Total: 3.2 g/dL (ref 1.5–4.5)
Glucose: 75 mg/dL (ref 65–99)
Potassium: 4 mmol/L (ref 3.5–5.2)
Sodium: 139 mmol/L (ref 134–144)
Total Protein: 7.2 g/dL (ref 6.0–8.5)

## 2019-06-06 LAB — HEPATITIS B SURFACE ANTIBODY,QUALITATIVE: Hep B Surface Ab, Qual: REACTIVE

## 2019-06-13 ENCOUNTER — Other Ambulatory Visit: Payer: No Typology Code available for payment source

## 2019-06-17 ENCOUNTER — Telehealth: Payer: No Typology Code available for payment source | Admitting: Emergency Medicine

## 2019-06-17 DIAGNOSIS — M545 Low back pain, unspecified: Secondary | ICD-10-CM

## 2019-06-17 MED ORDER — NAPROXEN 500 MG PO TABS: 500.0000 mg | ORAL_TABLET | Freq: Two times a day (BID) | ORAL | 0 refills | Status: DC

## 2019-06-17 NOTE — Progress Notes (Signed)
We are sorry that you are not feeling well.  Here is how we plan to help!  Based on what you have shared with me it looks like you mostly have acute back pain.  Acute back pain is defined as musculoskeletal pain that can resolve in 1-3 weeks with conservative treatment.  I have prescribed Naprosyn 500 mg take one by mouth twice a day non-steroid anti-inflammatory (NSAID).  Some patients experience stomach irritation or in increased heartburn with anti-inflammatory drugs.  Back pain is very common.  The pain often gets better over time.  The cause of back pain is usually not dangerous.  Most people can learn to manage their back pain on their own.  Home Care  Stay active.  Start with short walks on flat ground if you can.  Try to walk farther each day.  Do not sit, drive or stand in one place for more than 30 minutes.  Do not stay in bed.  Do not avoid exercise or work.  Activity can help your back heal faster.  Be careful when you bend or lift an object.  Bend at your knees, keep the object close to you, and do not twist.  Sleep on a firm mattress.  Lie on your side, and bend your knees.  If you lie on your back, put a pillow under your knees.  Only take medicines as told by your doctor.  Put ice on the injured area.  Put ice in a plastic bag  Place a towel between your skin and the bag  Leave the ice on for 15-20 minutes, 3-4 times a day for the first 2-3 days. 210 After that, you can switch between ice and heat packs.  Ask your doctor about back exercises or massage.  Avoid feeling anxious or stressed.  Find good ways to deal with stress, such as exercise.  Get Help Right Way If:  Your pain does not go away with rest or medicine.  Your pain does not go away in 1 week.  You have new problems.  You do not feel well.  The pain spreads into your legs.  You cannot control when you poop (bowel movement) or pee (urinate)  You feel sick to your stomach (nauseous) or throw up  (vomit)  You have belly (abdominal) pain.  You feel like you may pass out (faint).  If you develop a fever.  Make Sure you:  Understand these instructions.  Will watch your condition  Will get help right away if you are not doing well or get worse.  Your e-visit answers were reviewed by a board certified advanced clinical practitioner to complete your personal care plan.  Depending on the condition, your plan could have included both over the counter or prescription medications.  If there is a problem please reply  once you have received a response from your provider.  Your safety is important to [place].  If you have drug allergies check your prescription carefully.    You can use MyChart to ask questions about today's visit, request a non-urgent call back, or ask for a work or school excuse for 24 hours related to this e-Visit. If it has been greater than 24 hours you will need to follow up with your provider, or enter a new e-Visit to address those concerns.  You will get an e-mail in the next two days asking about your experience.  I hope that your e-visit has been valuable and will speed your recovery. Thank you  for using e-visits.  Approximately 5 minutes was used in reviewing the patient's chart, questionnaire, prescribing medications, and documentation.

## 2019-06-18 ENCOUNTER — Encounter: Payer: Self-pay | Admitting: Family Medicine

## 2019-06-22 ENCOUNTER — Ambulatory Visit (INDEPENDENT_AMBULATORY_CARE_PROVIDER_SITE_OTHER)
Admission: RE | Admit: 2019-06-22 | Discharge: 2019-06-22 | Disposition: A | Discharge status: Home / Self Care | Payer: No Typology Code available for payment source | Source: Ambulatory Visit

## 2019-06-22 DIAGNOSIS — S46811A Strain of other muscles, fascia and tendons at shoulder and upper arm level, right arm, initial encounter: Secondary | ICD-10-CM | POA: Diagnosis not present

## 2019-06-22 MED ORDER — CYCLOBENZAPRINE HCL 5 MG PO TABS: 5.0000 mg | ORAL_TABLET | Freq: Two times a day (BID) | ORAL | 0 refills | Status: AC | PRN

## 2019-06-22 NOTE — Discharge Instructions (Addendum)
Heat therapy (hot compress, warm wash red, hot showers, etc.) can help relax muscles and soothe muscle aches.  For pain: recommend 350 mg-1000 mg of Tylenol (acetaminophen) and/or 200 mg - 800 mg of Advil (ibuprofen, Motrin) every 8 hours as needed.  May alternate between the two throughout the day as they are generally safe to take together.  DO NOT exceed more than 3000 mg of Tylenol or 3200 mg of ibuprofen in a 24 hour period as this could damage your stomach, kidneys, liver, or increase your bleeding risk.  May take muscle relaxer as needed for severe pain / spasm.  (This medication may cause you to become tired so it is important you do not drink alcohol or operate heavy machinery while on this medication.  Recommend your first dose to be taken before bedtime to monitor for side effects safely)  Return for worsening pain, swelling, weakness, severe neck pain.

## 2019-06-22 NOTE — ED Provider Notes (Signed)
Virtual Visit via Video Note:  Emily Morales  initiated request for Telemedicine visit with [facility] team. I connected with Emily Morales  on 06/22/2019 at 12:01 PM  for a synchronized telemedicine visit using a video enabled HIPPA compliant telemedicine application. I verified that I am speaking with Emily Morales  using two identifiers. [place] Hall-Potvin, PA-C  was physically located in a [facility] site and Emily Morales was located at a different location.   The limitations of evaluation and management by telemedicine as well as the availability of in-person appointments were discussed. Patient was informed that she  may incur a bill ( including co-pay) for this virtual visit encounter. Emily Morales  expressed understanding and gave verbal consent to proceed with virtual visit.     History of Present Illness:Emily Morales  is a 28 y.o. female presents with 1 week course of right shoulder pain, stiffness.  States she has been using Voltaren gel and Epson salt without significant relief.  Patient has been avoiding NSAIDs as 2 weeks ago she had gastritis, which is now improved and she was without hemorrhage/ulcer.  Patient denies accident, injury, fall to the area.  No popping, clicking, decreased range of motion.  No weakness, tremors, numbness/tingling.  Patient states that she woke up from her sleep with this: States she sleeps on both sides.  Review of Systems  Constitutional: Negative for fever and malaise/fatigue.  Respiratory: Negative for cough and shortness of breath.   Cardiovascular: Negative for chest pain and palpitations.  Gastrointestinal: Negative for diarrhea and vomiting.  Musculoskeletal: Negative for back pain, falls and neck pain.  Neurological: Negative for sensory change and weakness.    Past Medical History:  Diagnosis Date  . Abnormal Pap smear   . Anemia   . Anxiety   . Arthritis   . Chronic  back pain   . Depression   . Dysrhythmia    irregular heartrate  . GERD (gastroesophageal reflux disease)   . Headache(784.0)   . Hx of chlamydia infection   . Ovarian cyst, right   . Perimenopausal   . Uterine fibroid     Allergies  Allergen Reactions  . Pollen Extract Cough  . Latex Rash  . Other Rash    Coconut causes a rash      Observations/Objective: 28 y.o. female Sitting in no acute distress.  Patient is able to speak in full sentences without coughing, sneezing, wheezing.  Patient has full active ROM of shoulders bilaterally, C-spine in all directions.  Patient does endorse pulling sensation over right superior trapezius when turning head to left.  Patient denies AC joint tenderness, posterior shoulder tenderness.  Positive for right neck arch tenderness  Assessment and Plan: H&P consistent with superior trapezius strain versus spasm.  Will trial low-dose muscle relaxer.  Encourage patient to trial NSAID after full meal as her gastritis has resolved and she was without ulcer, hemorrhage.  Provided exercises for rehabilitation.  Return precautions discussed, patient verbalized understanding and is agreeable to plan.  Follow Up Instructions: Patient to seek in-person evaluation for persistent/worsening symptoms.   I discussed the assessment and treatment plan with the patient. The patient was provided an opportunity to ask questions and all were answered. The patient agreed with the plan and demonstrated an understanding of the instructions.   The patient was advised to call back or seek an in-person evaluation if the symptoms worsen or if the condition fails to  improve as anticipated.  I provided 20 minutes of non-face-to-face time during this encounter.    [place], PA-C  06/22/2019 12:01 PM        Hall-Potvin, [place], PA-C 06/22/19 1239

## 2019-07-16 ENCOUNTER — Encounter: Payer: Self-pay | Admitting: Family Medicine

## 2019-07-16 ENCOUNTER — Other Ambulatory Visit: Payer: Self-pay

## 2019-07-16 ENCOUNTER — Ambulatory Visit (INDEPENDENT_AMBULATORY_CARE_PROVIDER_SITE_OTHER): Payer: No Typology Code available for payment source | Admitting: Family Medicine

## 2019-07-16 VITALS — BP 140/82 | Ht 65.0 in | Wt 215.0 lb

## 2019-07-16 DIAGNOSIS — M7989 Other specified soft tissue disorders: Secondary | ICD-10-CM | POA: Diagnosis not present

## 2019-07-16 MED ORDER — HYDROCHLOROTHIAZIDE 12.5 MG PO CAPS: 12.5000 mg | ORAL_CAPSULE | Freq: Every day | ORAL | 0 refills | Status: DC

## 2019-07-16 NOTE — Progress Notes (Signed)
BP 140/82   Ht '5\' 5"'  (1.651 m)   Wt 215 lb (97.5 kg)   BMI 35.78 kg/m    Subjective:    Patient ID: Emily Morales, female    DOB: [birth year], 28 y.o.   MRN: [id number]  HPI: Emily Morales is a 28 y.o. female  Chief Complaint  Patient presents with  . Edema    bilateral hands and feet x about a month, pain on calf     . This visit was completed via WebEx due to the restrictions of the COVID-19 pandemic. All issues as above were discussed and addressed. Physical exam was done as above through visual confirmation on WebEx. If it was felt that the patient should be evaluated in the office, they were directed there. The patient verbally consented to this visit. . Location of the patient: home . Location of the provider: home . Those involved with this call:  . Provider: Merrie Roof, PA-C . CMA: Lesle Chris, [place] . Front Desk/Registration: Jill Side  . Time spent on call: 15 minutes with patient face to face via video conference. More than 50% of this time was spent in counseling and coordination of care. 5 minutes total spent in review of patient's record and preparation of their chart. I verified patient identity using two factors (patient name and date of birth). Patient consents verbally to being seen via telemedicine visit today.   Patient presenting today to discuss about a month of swelling and stiffness in b/l hands and feet. Has increased water intake and avoided salt since her CPE several months ago to see it that would help things but the swelling persists. No redness, heat in joints, calf pain, CP, SOB, cough, fevers. Recent labs have all come back without obvious cause for her swelling.   Relevant past medical, surgical, family and social history reviewed and updated as indicated. Interim medical history since our last visit reviewed. Allergies and medications reviewed and updated.  Review of Systems  Per HPI unless specifically indicated above      Objective:    BP 140/82   Ht '5\' 5"'  (1.651 m)   Wt 215 lb (97.5 kg)   BMI 35.78 kg/m   Wt Readings from Last 3 Encounters:  07/16/19 215 lb (97.5 kg)  06/05/19 224 lb (101.6 kg)  04/22/19 223 lb (101.2 kg)    Physical Exam Vitals and nursing note reviewed.  Constitutional:      General: She is not in acute distress.    Appearance: Normal appearance.  HENT:     Head: Atraumatic.     Right Ear: External ear normal.     Left Ear: External ear normal.     Nose: Nose normal. No congestion.     Mouth/Throat:     Mouth: Mucous membranes are moist.     Pharynx: Oropharynx is clear. No posterior oropharyngeal erythema.  Eyes:     Extraocular Movements: Extraocular movements intact.     Conjunctiva/sclera: Conjunctivae normal.  Cardiovascular:     Comments: Unable to assess via virtual visit Pulmonary:     Effort: Pulmonary effort is normal. No respiratory distress.  Musculoskeletal:        General: Normal range of motion.     Cervical back: Normal range of motion.     Comments: Symmetric swelling all 4 extremities, trace  Skin:    General: Skin is dry.     Findings: No erythema.  Neurological:     Mental Status: She  is alert and oriented to person, place, and time.  Psychiatric:        Mood and Affect: Mood normal.        Thought Content: Thought content normal.        Judgment: Judgment normal.     Results for orders placed or performed in visit on 06/05/19  CBC With Differential/Platelet here  Result Value Ref Range   WBC 9.6 3.4 - 10.8 x10E3/uL   RBC 4.06 3.77 - 5.28 x10E6/uL   Hemoglobin 11.2 11.1 - 15.9 g/dL   Hematocrit 32.6 (L) 34.0 - 46.6 %   MCV 80 79 - 97 fL   MCH 27.6 26.6 - 33.0 pg   MCHC 34.4 31.5 - 35.7 g/dL   RDW 16.6 (H) 11.7 - 15.4 %   Platelets 309 150 - 450 x10E3/uL   Neutrophils 57 Not Estab. %   Lymphs 40 Not Estab. %   MID 3 Not Estab. %   Neutrophils Absolute 5.5 1.4 - 7.0 x10E3/uL   Lymphocytes Absolute 3.8 (H) 0.7 - 3.1 x10E3/uL   MID  (Absolute) 0.3 0.1 - 1.6 X10E3/uL  Comprehensive metabolic panel  Result Value Ref Range   Glucose 75 65 - 99 mg/dL   BUN 14 6 - 20 mg/dL   Creatinine, Ser 0.76 0.57 - 1.00 mg/dL   GFR calc non Af Amer 106 >59 mL/min/1.73   GFR calc Af Amer 122 >59 mL/min/1.73   BUN/Creatinine Ratio 18 9 - 23   Sodium 139 134 - 144 mmol/L   Potassium 4.0 3.5 - 5.2 mmol/L   Chloride 105 96 - 106 mmol/L   CO2 19 (L) 20 - 29 mmol/L   Calcium 9.2 8.7 - 10.2 mg/dL   Total Protein 7.2 6.0 - 8.5 g/dL   Albumin 4.0 3.9 - 5.0 g/dL   Globulin, Total 3.2 1.5 - 4.5 g/dL   Albumin/Globulin Ratio 1.3 1.2 - 2.2   Bilirubin Total 0.3 0.0 - 1.2 mg/dL   Alkaline Phosphatase 80 39 - 117 IU/L   AST 10 0 - 40 IU/L   ALT 30 0 - 32 IU/L  Hepatitis B Surface AntiBODY  Result Value Ref Range   Hep B Surface Ab, Qual Reactive       Assessment & Plan:   Problem List Items Addressed This Visit    None    Visit Diagnoses    Swelling of both hands    -  Primary   Start low dose HCTZ with close BP monitoring. Continue supportive care, and check labs to r/o inflamm/autoimmune causes.    Relevant Orders   Sed Rate (ESR)   ANA w/Reflex   Rheumatoid Factor       Follow up plan: Return in about 4 weeks (around 08/13/2019) for Swelling f/u.

## 2019-07-17 NOTE — Telephone Encounter (Signed)
Completed.

## 2019-07-21 ENCOUNTER — Other Ambulatory Visit: Payer: Self-pay | Admitting: Family Medicine

## 2019-07-23 LAB — ANA W/REFLEX: Anti Nuclear Antibody (ANA): NEGATIVE

## 2019-07-23 LAB — SEDIMENTATION RATE: Sed Rate: 40 mm/hr — ABNORMAL HIGH (ref 0–32)

## 2019-07-23 LAB — RHEUMATOID FACTOR: Rheumatoid fact SerPl-aCnc: <10 IU/mL (ref 0.0–13.9)

## 2019-07-28 ENCOUNTER — Encounter: Payer: Self-pay | Admitting: Family Medicine

## 2019-07-29 ENCOUNTER — Other Ambulatory Visit: Payer: Self-pay | Admitting: Family Medicine

## 2019-07-29 DIAGNOSIS — M25541 Pain in joints of right hand: Secondary | ICD-10-CM

## 2019-07-29 DIAGNOSIS — M25542 Pain in joints of left hand: Secondary | ICD-10-CM

## 2019-07-29 DIAGNOSIS — M7989 Other specified soft tissue disorders: Secondary | ICD-10-CM

## 2019-08-05 ENCOUNTER — Telehealth: Payer: No Typology Code available for payment source

## 2019-08-05 ENCOUNTER — Encounter: Payer: Self-pay | Admitting: Family Medicine

## 2019-08-05 ENCOUNTER — Other Ambulatory Visit (HOSPITAL_COMMUNITY): Payer: Self-pay | Admitting: Psychiatry

## 2019-08-05 ENCOUNTER — Other Ambulatory Visit: Payer: Self-pay

## 2019-08-05 MED ORDER — QUETIAPINE FUMARATE 100 MG PO TABS: 100.0000 mg | ORAL_TABLET | Freq: Every day | ORAL | 2 refills | Status: DC

## 2019-08-06 ENCOUNTER — Other Ambulatory Visit: Payer: Self-pay | Admitting: Family Medicine

## 2019-08-06 MED ORDER — HYDROCHLOROTHIAZIDE 12.5 MG PO CAPS: 12.5000 mg | ORAL_CAPSULE | Freq: Every day | ORAL | 0 refills | Status: DC

## 2019-08-06 NOTE — Telephone Encounter (Signed)
According to last refill, this should not be due until at least 08/15/19

## 2019-08-19 ENCOUNTER — Other Ambulatory Visit: Payer: Self-pay

## 2019-08-19 ENCOUNTER — Encounter: Payer: Self-pay | Admitting: Psychiatry

## 2019-08-19 ENCOUNTER — Ambulatory Visit (INDEPENDENT_AMBULATORY_CARE_PROVIDER_SITE_OTHER): Payer: 59 | Admitting: Psychiatry

## 2019-08-19 DIAGNOSIS — F410 Panic disorder [episodic paroxysmal anxiety] without agoraphobia: Secondary | ICD-10-CM | POA: Diagnosis not present

## 2019-08-19 DIAGNOSIS — F3342 Major depressive disorder, recurrent, in full remission: Secondary | ICD-10-CM | POA: Diagnosis not present

## 2019-08-19 DIAGNOSIS — F330 Major depressive disorder, recurrent, mild: Secondary | ICD-10-CM | POA: Insufficient documentation

## 2019-08-19 MED ORDER — TEMAZEPAM 15 MG PO CAPS: 15.0000 mg | ORAL_CAPSULE | Freq: Every evening | ORAL | 1 refills | Status: DC | PRN

## 2019-08-19 NOTE — Progress Notes (Signed)
BH MD/PA/NP OP Progress Note  08/19/2019 1:58 PM Emily Morales  MRN:  [id number]  Chief Complaint: " I am doing fine."  HPI: Patient was noted to be at work.  She works for [employer].  Patient informed that she is no longer taking the Prozac.  She tried taking it for few weeks but did not find it to be effective.  She is now been reading on self-motivation books and doing other things to improve her mood.  She stated that her mood has been fine lately.  She did inform that Seroquel is no longer helping her with sleep as much as it was.  She is able to fall asleep with it but has a difficult time staying asleep.  She is also aware of the associated side effect of increased appetite and weight gain. Patient informed she has tried Ambien and trazodone for insomnia in the past and did not find them to be helpful.  She was agreeable to trying temazepam for insomnia.   Visit Diagnosis:    ICD-10-CM   1. MDD (major depressive disorder), recurrent, in full remission ([place])  F33.42   2. Panic disorder  F41.0     Past Psychiatric History: Depression, anxiety  Past Medical History:  Past Medical History:  Diagnosis Date  . Abnormal Pap smear   . Anemia   . Anxiety   . Arthritis   . Chronic back pain   . Depression   . Dysrhythmia    irregular heartrate  . GERD (gastroesophageal reflux disease)   . Headache(784.0)   . Hx of chlamydia infection   . Ovarian cyst, right   . Perimenopausal   . Uterine fibroid     Past Surgical History:  Procedure Laterality Date  . BIOPSY  11/14/2016   Procedure: BIOPSY;  Surgeon: Danie Binder, MD;  [facility];  Service: Endoscopy;;  duodenal gastric  . COLPOSCOPY    . ESOPHAGOGASTRODUODENOSCOPY (EGD) WITH PROPOFOL N/A 11/14/2016   Dr. Oneida Alar: Mild gastritis, normal esophagus, biopsy showed inflammation of the small bowel, gastric biopsies with no H. pylori.  . lipoma surgery    . MASS EXCISION N/A 02/28/2017   Procedure: EXCISION LIPOMA SCALP 3CM;  Surgeon: Aviva Signs, MD;  Location: [facility];  Service: General;  Laterality: N/A;  . UMBILICAL HERNIA REPAIR N/A 04/04/2019   Procedure: HERNIA REPAIR UMBILICAL ADULT WITH MESH;  Surgeon: Virl Cagey, MD;  Location: [facility];  Service: General;  Laterality: N/A;    Family Psychiatric History: see below  Family History:  Family History  Problem Relation Age of Onset  . Diabetes Father   . Hypertension Father   . Other Son        reactive airway disease  . Asthma Son   . Heart murmur Son   . Depression Mother   . Anxiety disorder Mother   . Cancer Maternal Grandmother        lung  . Lung cancer Maternal Grandmother   . Heart disease Paternal Grandmother   . Hypertension Paternal Grandmother   . Liver disease Paternal Grandfather        etoh related  . Alcohol abuse Maternal Grandfather   . Colon cancer Neg Hx     Social History:  Social History   Socioeconomic History  . Marital status: Married    Spouse name: Not on file  . Number of children: 2  . Years of education: Not on file  . Highest education level:  Not on file  Occupational History  . Occupation: clinical care coordinator    Comment: [employer]  Tobacco Use  . Smoking status: Former Smoker    Types: Cigarettes  . Smokeless tobacco: Never Used  Substance and Sexual Activity  . Alcohol use: Yes  . Drug use: Yes    Types: Other-see comments    Comment: CBD drops  . Sexual activity: Yes    Partners: Male    Birth control/protection: None  Other Topics Concern  . Not on file  Social History Narrative  . Not on file   Social Determinants of Health   Financial Resource Strain:   . Difficulty of Paying Living Expenses: Not on file  Food Insecurity:   . Worried About Charity fundraiser in the Last Year: Not on file  . Ran Out of Food in the Last Year: Not on file  Transportation Needs:   . Lack of Transportation (Medical): Not on file  . Lack of  Transportation (Non-Medical): Not on file  Physical Activity:   . Days of Exercise per Week: Not on file  . Minutes of Exercise per Session: Not on file  Stress:   . Feeling of Stress : Not on file  Social Connections:   . Frequency of Communication with Friends and Family: Not on file  . Frequency of Social Gatherings with Friends and Family: Not on file  . Attends Religious Services: Not on file  . Active Member of Clubs or Organizations: Not on file  . Attends Archivist Meetings: Not on file  . Marital Status: Not on file    Allergies:  Allergies  Allergen Reactions  . Pollen Extract Cough  . Latex Rash  . Other Rash    Coconut causes a rash    Metabolic Disorder Labs: No results found for: HGBA1C, MPG No results found for: PROLACTIN Lab Results  Component Value Date   CHOL 204 (H) 04/21/2019   TRIG 113 04/21/2019   HDL 36 (L) 04/21/2019   LDLCALC 147 (H) 04/21/2019   Lab Results  Component Value Date   TSH 3.540 04/21/2019   TSH 2.690 05/02/2018    Therapeutic Level Labs: No results found for: LITHIUM No results found for: VALPROATE No components found for:  CBMZ  Current Medications: Current Outpatient Medications  Medication Sig Dispense Refill  . hydrochlorothiazide (MICROZIDE) 12.5 MG capsule Take 1 capsule (12.5 mg total) by mouth daily. 30 capsule 0  . montelukast (SINGULAIR) 10 MG tablet Take 1 tablet (10 mg total) by mouth at bedtime. 30 tablet 3  . sucralfate (CARAFATE) 1 g tablet Take 1 tablet (1 g total) by mouth 4 (four) times daily -  with meals and at bedtime. 60 tablet 0   No current facility-administered medications for this visit.     Psychiatric Specialty Exam: Review of Systems  There were no vitals taken for this visit.There is no height or weight on file to calculate BMI.  General Appearance: Well Groomed  Eye Contact:  Good  Speech:  Clear and Coherent and Normal Rate  Volume:  Normal  Mood:  Euthymic  Affect:   Congruent  Thought Process:  Goal Directed, Linear and Descriptions of Associations: Intact  Orientation:  Full (Time, Place, and Person)  Thought Content: Logical   Suicidal Thoughts:  No  Homicidal Thoughts:  No  Memory:  Recent;   Good Remote;   Good  Judgement:  Good  Insight:  Good  Psychomotor Activity:  Normal  Concentration:  Concentration: Good and Attention Span: Good  Recall:  Good  Fund of Knowledge: Good  Language: Good  Akathisia:  Negative  Handed:  Right  AIMS (if indicated): not done  Assets:  Communication Skills Desire for Improvement Financial Resources/Insurance Housing  ADL's:  Intact  Cognition: WNL  Sleep:  Poor   Screenings: GAD-7     Video Visit from 12/13/2018 in [place] Visit from 08/15/2018 in [facility]  Total GAD-7 Score  12  15    PHQ2-9     Office Visit from 04/21/2019 in [facility] Video Visit from 12/13/2018 in [place] Visit from 08/15/2018 in [place]  PHQ-2 Total Score  2  4  4   PHQ-9 Total Score  9  11  16        Assessment and Plan: Patient reported that she is no longer on Prozac, Seroquel is not helpful with sleep like it used to be.  She was offered temazepam, potential side effects of medication and risks vs benefits of treatment vs non-treatment were explained and discussed. All questions were answered.  1. MDD (major depressive disorder), recurrent, in full remission ([place]) - Discontinue seroquel due to lack of efficacy and side effect of weight gain. - temazepam (RESTORIL) 15 MG capsule; Take 1 capsule (15 mg total) by mouth at bedtime as needed for sleep.  Dispense: 30 capsule; Refill: 1  2. Panic disorder  - temazepam (RESTORIL) 15 MG capsule; Take 1 capsule (15 mg total) by mouth at bedtime as needed for sleep.  Dispense: 30 capsule; Refill: 1  F/up in 2 months.   Nevada Crane, MD 08/19/2019, 1:58 PM

## 2019-08-22 ENCOUNTER — Telehealth (INDEPENDENT_AMBULATORY_CARE_PROVIDER_SITE_OTHER): Payer: No Typology Code available for payment source | Admitting: Family Medicine

## 2019-08-22 ENCOUNTER — Encounter: Payer: Self-pay | Admitting: Family Medicine

## 2019-08-22 ENCOUNTER — Ambulatory Visit: Payer: No Typology Code available for payment source | Admitting: Adult Health

## 2019-08-22 ENCOUNTER — Ambulatory Visit: Payer: No Typology Code available for payment source | Admitting: Family Medicine

## 2019-08-22 VITALS — Temp 98.7°F | Ht 65.0 in | Wt 210.0 lb

## 2019-08-22 DIAGNOSIS — M7989 Other specified soft tissue disorders: Secondary | ICD-10-CM

## 2019-08-22 DIAGNOSIS — K5900 Constipation, unspecified: Secondary | ICD-10-CM | POA: Diagnosis not present

## 2019-08-22 NOTE — Progress Notes (Signed)
Temp 98.7 F (37.1 C) (Temporal)   Ht 5\' 5"  (1.651 m)   Wt 210 lb (95.3 kg)   BMI 34.95 kg/m    Subjective:    Patient ID: Emily Morales, female    DOB: 16-Oct-1990, 29 y.o.   MRN: [id number]  HPI: Emily Morales is a 29 y.o. female  Chief Complaint  Patient presents with  . Edema    . This visit was completed via WebEx due to the restrictions of the COVID-19 pandemic. All issues as above were discussed and addressed. Physical exam was done as above through visual confirmation on WebEx. If it was felt that the patient should be evaluated in the office, they were directed there. The patient verbally consented to this visit. . Location of the patient: in parked car . Location of the provider: work . Those involved with this call:  . Provider: Merrie Roof, PA-C . CMA: Lesle Chris, [place] . Front Desk/Registration: Jill Side  . Time spent on call: 15 minutes with patient face to face via video conference. More than 50% of this time was spent in counseling and coordination of care. 5 minutes total spent in review of patient's record and preparation of their chart. I verified patient identity using two factors (patient name and date of birth). Patient consents verbally to being seen via telemedicine visit today.   Presenting today for extremity edema f/u after starting low dose HCTZ. Swelling has gotten a lot better but still having occasional joint pain. No redness, warmth, mobility limitations. Tolerating the medication well without side effects, and denies dizziness, CP, SOB.   Having recurring abdominal pain and constipation issues, tried a laxative the other night but could not keep it down. Has been making some diet changes trying to lose weight but otherwise things are the same regarding her routine. Denies fever, chills, melena.    Relevant past medical, surgical, family and social history reviewed and updated as indicated. Interim medical history since our last  visit reviewed. Allergies and medications reviewed and updated.  Review of Systems  Per HPI unless specifically indicated above     Objective:    Temp 98.7 F (37.1 C) (Temporal)   Ht 5\' 5"  (1.651 m)   Wt 210 lb (95.3 kg)   BMI 34.95 kg/m   Wt Readings from Last 3 Encounters:  08/22/19 210 lb (95.3 kg)  07/16/19 215 lb (97.5 kg)  06/05/19 224 lb (101.6 kg)    Physical Exam Vitals and nursing note reviewed.  Constitutional:      General: She is not in acute distress.    Appearance: Normal appearance.  HENT:     Head: Atraumatic.     Right Ear: External ear normal.     Left Ear: External ear normal.     Nose: Nose normal. No congestion.     Mouth/Throat:     Mouth: Mucous membranes are moist.     Pharynx: Oropharynx is clear. No posterior oropharyngeal erythema.  Eyes:     Extraocular Movements: Extraocular movements intact.     Conjunctiva/sclera: Conjunctivae normal.  Cardiovascular:     Comments: Unable to assess via virtual visit Pulmonary:     Effort: Pulmonary effort is normal. No respiratory distress.  Abdominal:     Comments: Unable to perform abdominal exam due to virtual nature of visit  Musculoskeletal:        General: No swelling. Normal range of motion.     Cervical back: Normal range of motion.  Skin:    General: Skin is dry.     Findings: No erythema.  Neurological:     Mental Status: She is alert and oriented to person, place, and time.  Psychiatric:        Mood and Affect: Mood normal.        Thought Content: Thought content normal.        Judgment: Judgment normal.     Results for orders placed or performed in visit on 07/21/19  Rheumatoid factor  Result Value Ref Range   Rhuematoid fact SerPl-aCnc <10.0 0.0 - 13.9 IU/mL  ANA w/Reflex  Result Value Ref Range   Anti Nuclear Antibody (ANA) Negative Negative  Sedimentation rate  Result Value Ref Range   Sed Rate 40 (H) 0 - 32 mm/hr      Assessment & Plan:   Problem List Items  Addressed This Visit      Other   Constipation    Suspect this is causing her abdominal pain. Fiber supplement, probiotics, miralax prn for BM regularity. F/u if worsening or not improving       Other Visit Diagnoses    Swelling of both hands    -  Primary   Improved with low dose HCTZ. Continue current regimen, will check labs and vitals at follow up in person. Continue compression, sodium restriction, fluids       Follow up plan: Return in about 6 months (around 02/19/2020) for swelling f/u, bmp.

## 2019-08-22 NOTE — Assessment & Plan Note (Signed)
Suspect this is causing her abdominal pain. Fiber supplement, probiotics, miralax prn for BM regularity. F/u if worsening or not improving

## 2019-08-25 ENCOUNTER — Encounter: Payer: Self-pay | Admitting: Family Medicine

## 2019-08-26 NOTE — Progress Notes (Deleted)
Office Visit Note  Patient: Emily Morales             Date of Birth: 1990-08-21           MRN: [id number]             PCP: Volney American, PA-C Referring: Volney American,* Visit Date: 08/29/2019 Occupation: _0 @  Subjective:  No chief complaint on file.   History of Present Illness: Emily Morales is a 29 y.o. female ***   Activities of Daily Living:  Patient reports morning stiffness for *** {minute/hour:19697}.   Patient {ACTIONS;DENIES/REPORTS:21021675::"Denies"} nocturnal pain.  Difficulty dressing/grooming: {ACTIONS;DENIES/REPORTS:21021675::"Denies"} Difficulty climbing stairs: {ACTIONS;DENIES/REPORTS:21021675::"Denies"} Difficulty getting out of chair: {ACTIONS;DENIES/REPORTS:21021675::"Denies"} Difficulty using hands for taps, buttons, cutlery, and/or writing: {ACTIONS;DENIES/REPORTS:21021675::"Denies"}  No Rheumatology ROS completed.   PMFS History:  Patient Active Problem List   Diagnosis Date Noted  . MDD (major depressive disorder), recurrent episode, mild ([place]) 08/19/2019  . Panic disorder 08/19/2019  . Epigastric hernia 03/17/2019  . Insomnia 08/23/2018  . Obesity (BMI 35.0-39.9 without comorbidity) 08/23/2018  . Atypical chest pain 05/08/2017  . Benign lipomatous neoplasm of skin and subcutaneous tissue of head, face and neck   . Dyspepsia   . Anemia 11/07/2016  . GERD (gastroesophageal reflux disease) 11/07/2016  . Constipation 11/07/2016  . Encounter for IUD insertion liletta 12/10/2015  . Anxiety and depression 11/17/2015  . Umbilical hernia without obstruction and without gangrene 09/08/2015  . Oligouria 08/17/2015  . Mild dysplasia of cervix 03/26/2014    Past Medical History:  Diagnosis Date  . Abnormal Pap smear   . Anemia   . Anxiety   . Arthritis   . Chronic back pain   . Depression   . Dysrhythmia    irregular heartrate  . GERD (gastroesophageal reflux disease)   . Headache(784.0)   . Hx of chlamydia  infection   . Ovarian cyst, right   . Perimenopausal   . Uterine fibroid     Family History  Problem Relation Age of Onset  . Diabetes Father   . Hypertension Father   . Other Son        reactive airway disease  . Asthma Son   . Heart murmur Son   . Depression Mother   . Anxiety disorder Mother   . Cancer Maternal Grandmother        lung  . Lung cancer Maternal Grandmother   . Heart disease Paternal Grandmother   . Hypertension Paternal Grandmother   . Liver disease Paternal Grandfather        etoh related  . Alcohol abuse Maternal Grandfather   . Colon cancer Neg Hx    Past Surgical History:  Procedure Laterality Date  . BIOPSY  11/14/2016   Procedure: BIOPSY;  Surgeon: Danie Binder, MD;  [facility];  Service: Endoscopy;;  duodenal gastric  . COLPOSCOPY    . ESOPHAGOGASTRODUODENOSCOPY (EGD) WITH PROPOFOL N/A 11/14/2016   Dr. Oneida Alar: Mild gastritis, normal esophagus, biopsy showed inflammation of the small bowel, gastric biopsies with no H. pylori.  . lipoma surgery    . MASS EXCISION N/A 02/28/2017   Procedure: EXCISION LIPOMA SCALP 3CM;  Surgeon: Aviva Signs, MD;  Location: [facility];  Service: General;  Laterality: N/A;  . UMBILICAL HERNIA REPAIR N/A 04/04/2019   Procedure: HERNIA REPAIR UMBILICAL ADULT WITH MESH;  Surgeon: Virl Cagey, MD;  Location: [facility];  Service: General;  Laterality: N/A;   Social History   Social History Narrative  .  Not on file   Immunization History  Administered Date(s) Administered  . DTaP 03/26/1989, 12/27/1989, 03/21/1994, 04/27/1994  . Hepatitis B 06/13/2004  . Hepatitis B, adult 04/21/2019  . Hpv 11/15/2006  . IPV 03/26/1989, 12/27/1989, 03/21/1994  . Influenza Split 04/25/2012  . Influenza,inj,Quad PF,6+ Mos 05/16/2018, 04/21/2019  . Influenza-Unspecified 05/16/2018  . MMR 03/21/1994, 04/27/1994  . Td 11/29/1999, 05/24/2010  . Tdap 04/25/2012, 10/20/2015     Objective: Vital Signs: There were no vitals  taken for this visit.   Physical Exam   Musculoskeletal Exam: ***  CDAI Exam: CDAI Score: -- Patient Global: --; Provider Global: -- Swollen: --; Tender: -- Joint Exam 08/29/2019   No joint exam has been documented for this visit   There is currently no information documented on the homunculus. Go to the Rheumatology activity and complete the homunculus joint exam.  Investigation: No additional findings.  Imaging: No results found.  Recent Labs: Lab Results  Component Value Date   WBC 9.6 06/05/2019   HGB 11.2 06/05/2019   PLT 309 06/05/2019   NA 139 06/05/2019   K 4.0 06/05/2019   CL 105 06/05/2019   CO2 19 (L) 06/05/2019   GLUCOSE 75 06/05/2019   BUN 14 06/05/2019   CREATININE 0.76 06/05/2019   BILITOT 0.3 06/05/2019   ALKPHOS 80 06/05/2019   AST 10 06/05/2019   ALT 30 06/05/2019   PROT 7.2 06/05/2019   ALBUMIN 4.0 06/05/2019   CALCIUM 9.2 06/05/2019   GFRAA 122 06/05/2019   QFTBGOLDPLUS Negative 04/21/2019    Speciality Comments: No specialty comments available.  Procedures:  No procedures performed Allergies: Hctz [hydrochlorothiazide], Pollen extract, Latex, and Other   Assessment / Plan:     Visit Diagnoses: No diagnosis found.  Orders: No orders of the defined types were placed in this encounter.  No orders of the defined types were placed in this encounter.   Face-to-face time spent with patient was *** minutes. Greater than 50% of time was spent in counseling and coordination of care.  Follow-Up Instructions: No follow-ups on file.   [name], PA-C  Note - This record has been created using Dragon software.  Chart creation errors have been sought, but may not always  have been located. Such creation errors do not reflect on  the standard of medical care.

## 2019-08-29 ENCOUNTER — Ambulatory Visit: Payer: Self-pay | Admitting: Rheumatology

## 2019-09-09 ENCOUNTER — Ambulatory Visit (INDEPENDENT_AMBULATORY_CARE_PROVIDER_SITE_OTHER): Payer: 59 | Admitting: Allergy & Immunology

## 2019-09-09 ENCOUNTER — Other Ambulatory Visit: Payer: Self-pay

## 2019-09-09 ENCOUNTER — Encounter: Payer: Self-pay | Admitting: Allergy & Immunology

## 2019-09-09 VITALS — BP 122/86 | HR 71 | Temp 97.6°F | Resp 18 | Ht 65.0 in | Wt 234.0 lb

## 2019-09-09 DIAGNOSIS — L508 Other urticaria: Secondary | ICD-10-CM

## 2019-09-09 DIAGNOSIS — J3089 Other allergic rhinitis: Secondary | ICD-10-CM

## 2019-09-09 DIAGNOSIS — J302 Other seasonal allergic rhinitis: Secondary | ICD-10-CM | POA: Diagnosis not present

## 2019-09-09 MED ORDER — FAMOTIDINE 20 MG PO TABS: 20.0000 mg | ORAL_TABLET | Freq: Two times a day (BID) | ORAL | 5 refills | Status: DC

## 2019-09-09 MED ORDER — FEXOFENADINE HCL 180 MG PO TABS: 360.0000 mg | ORAL_TABLET | Freq: Every day | ORAL | 5 refills | Status: DC

## 2019-09-09 NOTE — Progress Notes (Signed)
NEW PATIENT  Date of Service/Encounter:  09/09/19  Referring provider: Volney American, PA-C   Assessment:   Chronic urticaria - in the setting of an elevated ESR in December 2020  Seasonal and perennial allergic rhinitis (grasses, weeds, trees and indoor molds)  Plan/Recommendations:   1. Chronic urticaria - Your history does not have any "red flags" such as fevers, joint pains, or permanent skin changes that would be concerning for a more serious cause of hives.  - Testing today showed sensitization to some pollens as well as some indoor molds. - Indoor mold exposure can cause some itching, but it would most commonly present with worsening congestion. - It is reassuring that you had a negative autoimmune work-up recently, aside from that ESR. - We are going to get another ESR today to make sure that has normalized. - We are also going to get a chronic urticaria panel as well as an alpha gal panel and thyroid antibodies. - We are going to get a serum tryptase as well to look for mast cell disease. - Chronic hives are often times a self limited process and will "burn themselves out" over 6-12 months, although this is not always the case.  - Start a prednisone dose pack provided.  - In the meantime, start suppressive dosing of antihistamines:   - Morning: Allegra (fexofenadine) 372m (two tablets) + Pepcid (famotidine) 248m - Evening: Zyrtec (cetirizine) 2022mtwo tablets) + Singulair (montelukast) 23m42mPepcid (famotidine) 20mg38mou can change this dosing at home, decreasing the dose as needed or increasing the dosing as needed.  - If you are not tolerating the medications or are tired of taking them every day, we can start treatment with a monthly injectable medication called Xolair.   2. Seasonal and perennial allergic rhinitis - Testing today showed: grasses, weeds, trees and indoor molds - Copy of test results provided.  - Avoidance measures provided. - Start  taking: antihistamines as above as well as montelukast - You can use an extra dose of the antihistamine, if needed, for breakthrough symptoms.  - Consider nasal saline rinses 1-2 times daily to remove allergens from the nasal cavities as well as help with mucous clearance (this is especially helpful to do before the nasal sprays are given)  3. Return in about 6 weeks (around 10/21/2019). This can be an in-person, a virtual Webex or a telephone follow up visit.  Subjective:   Emily Morales 29 y.68 female presenting today for evaluation of  Chief Complaint  Patient presents with  . Pruritis    Emily Morales history of the following: Patient Active Problem List   Diagnosis Date Noted  . MDD (major depressive disorder), recurrent episode, mild (HCC) Coldiron19/2021  . Panic disorder 08/19/2019  . Epigastric hernia 03/17/2019  . Insomnia 08/23/2018  . Obesity (BMI 35.0-39.9 without comorbidity) 08/23/2018  . Atypical chest pain 05/08/2017  . Benign lipomatous neoplasm of skin and subcutaneous tissue of head, face and neck   . Dyspepsia   . Anemia 11/07/2016  . GERD (gastroesophageal reflux disease) 11/07/2016  . Constipation 11/07/2016  . Encounter for IUD insertion liletta 12/10/2015  . Anxiety and depression 11/17/2015  . Umbilical hernia without obstruction and without gangrene 09/08/2015  . Oligouria 08/17/2015  . Mild dysplasia of cervix 03/26/2014    History obtained from: chart review and patient.  Emily Adelene Idlerreferred by Emily Morales,Volney AmericanC.     Emily Morales  29 y.o. female presenting for an evaluation of itching.  She has been having itching for months at a time. She is breaking skin and is "digging" everywhere. She has tried taking Benadryl which she takes before bed. This does provide some relief. She does have Aveeno anti-itch cream. It does help temporarily. She does have temazepam that she takes for sleeping. This was around one  month ago as well. This is PRN and she has not needed it.   One month ago, she started HCTZ. She stopped this around two weeks ago and the itching has not improved at all. They did not start a different medication for her BP. She does not notice that it gets worse with any particular food. She does report some whelps with this. She has done cetirizine and montelukast as well.   She does have occasional eye burning. "This is worse in the morning and gets during the evening. She does not use any eye drops at all. She has had these issues for several years now. There is a dog in the home, who is not new. The dog is 28 years old at this time.  She denies any new exposures to foods.  She eats all of major food allergens without adverse event.  She was having some swelling in late 2020 and had a work-up including an ANA which was negative, rheumatoid factor which was negative, and an ESR which was slightly elevated at 40.  The swelling preceded the itching.   Otherwise, there is no history of other atopic diseases, including asthma, drug allergies, stinging insect allergies, eczema or contact dermatitis. There is no significant infectious history. Vaccinations are up to date.    Past Medical History: Patient Active Problem List   Diagnosis Date Noted  . MDD (major depressive disorder), recurrent episode, mild ([place]) 08/19/2019  . Panic disorder 08/19/2019  . Epigastric hernia 03/17/2019  . Insomnia 08/23/2018  . Obesity (BMI 35.0-39.9 without comorbidity) 08/23/2018  . Atypical chest pain 05/08/2017  . Benign lipomatous neoplasm of skin and subcutaneous tissue of head, face and neck   . Dyspepsia   . Anemia 11/07/2016  . GERD (gastroesophageal reflux disease) 11/07/2016  . Constipation 11/07/2016  . Encounter for IUD insertion liletta 12/10/2015  . Anxiety and depression 11/17/2015  . Umbilical hernia without obstruction and without gangrene 09/08/2015  . Oligouria 08/17/2015  . Mild  dysplasia of cervix 03/26/2014    Medication List:  Allergies as of 09/09/2019      Reactions   Hctz [hydrochlorothiazide] Itching   Pollen Extract Cough   Latex Rash   Other Rash   Coconut causes a rash      Medication List       Accurate as of September 09, 2019  2:36 PM. If you have any questions, ask your nurse or doctor.        STOP taking these medications   hydrochlorothiazide 12.5 MG capsule Commonly known as: Microzide Stopped by: Valentina Shaggy, MD     TAKE these medications   temazepam 15 MG capsule Commonly known as: RESTORIL Take 1 capsule (15 mg total) by mouth at bedtime as needed for sleep.   Xulane 150-35 MCG/24HR transdermal patch Generic drug: norelgestromin-ethinyl estradiol 1 patch once a week.       Birth History: non-contributory  Developmental History: non-contributory  Past Surgical History: Past Surgical History:  Procedure Laterality Date  . BIOPSY  11/14/2016   Procedure: BIOPSY;  Surgeon: Danie Binder, MD;  [facility]:  [facility];  Service: Endoscopy;;  duodenal gastric  . COLPOSCOPY    . ESOPHAGOGASTRODUODENOSCOPY (EGD) WITH PROPOFOL N/A 11/14/2016   Dr. Oneida Alar: Mild gastritis, normal esophagus, biopsy showed inflammation of the small bowel, gastric biopsies with no H. pylori.  . lipoma surgery    . MASS EXCISION N/A 02/28/2017   Procedure: EXCISION LIPOMA SCALP 3CM;  Surgeon: Aviva Signs, MD;  Location: [facility];  Service: General;  Laterality: N/A;  . UMBILICAL HERNIA REPAIR N/A 04/04/2019   Procedure: HERNIA REPAIR UMBILICAL ADULT WITH MESH;  Surgeon: Virl Cagey, MD;  Location: [facility];  Service: General;  Laterality: N/A;     Family History: Family History  Problem Relation Age of Onset  . Diabetes Father   . Hypertension Father   . Other Son        reactive airway disease  . Asthma Son   . Heart murmur Son   . Depression Mother   . Anxiety disorder Mother   . Cancer Maternal Grandmother        lung  .  Lung cancer Maternal Grandmother   . Heart disease Paternal Grandmother   . Hypertension Paternal Grandmother   . Liver disease Paternal Grandfather        etoh related  . Alcohol abuse Maternal Grandfather   . Colon cancer Neg Hx      Social History: Saadiya lives at home with her family in [place].  She lives in a house that was built in the 1980s.  There is carpeting throughout the home.  They have electric heating and central cooling.  There is 1 dog inside of the home.  There are no dust mite covers on the bedding.  There is tobacco exposure in the house in the form of cigarettes.  She does not smoke, but her significant other does.  She works as a Media planner.    Review of Systems  Constitutional: Negative.  Negative for fever, malaise/fatigue and weight loss.  HENT: Positive for congestion. Negative for ear discharge and ear pain.   Eyes: Negative for pain, discharge and redness.  Respiratory: Negative for cough, sputum production, shortness of breath and wheezing.   Cardiovascular: Negative.  Negative for chest pain and palpitations.  Gastrointestinal: Negative for abdominal pain, heartburn, nausea and vomiting.  Skin: Positive for itching and rash.  Neurological: Negative for dizziness and headaches.  Endo/Heme/Allergies: Positive for environmental allergies. Does not bruise/bleed easily.       Objective:   Blood pressure 122/86, pulse 71, temperature 97.6 F (36.4 C), temperature source Temporal, resp. rate 18, height _0  (1.651 m), weight 234 lb (106.1 kg), SpO2 99 %. Body mass index is 38.94 kg/m.   Physical Exam:   Physical Exam  Constitutional: She appears well-developed.  HENT:  Head: Normocephalic and atraumatic.  Right Ear: Tympanic membrane, external ear and ear canal normal. No drainage, swelling or tenderness. Tympanic membrane is not injected, not scarred, not erythematous, not retracted and not bulging.  Left  Ear: Tympanic membrane, external ear and ear canal normal. No drainage, swelling or tenderness. Tympanic membrane is not injected, not scarred, not erythematous, not retracted and not bulging.  Nose: Mucosal edema and rhinorrhea present. No nasal deformity or septal deviation. No epistaxis. Right sinus exhibits no maxillary sinus tenderness and no frontal sinus tenderness. Left sinus exhibits no maxillary sinus tenderness and no frontal sinus tenderness.  Mouth/Throat: Uvula is midline and oropharynx is clear and moist. Mucous membranes are  not pale and not dry.  Turbinates slightly enlarged.  There is some clear mucus present.  Eyes: Pupils are equal, round, and reactive to light. Conjunctivae and EOM are normal. Right eye exhibits no chemosis and no discharge. Left eye exhibits no chemosis and no discharge. Right conjunctiva is not injected. Left conjunctiva is not injected.  Cardiovascular: Normal rate, regular rhythm and normal heart sounds.  Respiratory: Effort normal and breath sounds normal. No accessory muscle usage. No tachypnea. No respiratory distress. She has no wheezes. She has no rhonchi. She has no rales. She exhibits no tenderness.  GI: There is no abdominal tenderness. There is no rebound and no guarding.  Lymphadenopathy:       Head (right side): No submandibular, no tonsillar and no occipital adenopathy present.       Head (left side): No submandibular, no tonsillar and no occipital adenopathy present.    She has no cervical adenopathy.  Neurological: She is alert.  Skin: No abrasion, no petechiae and no rash noted. Rash is not papular, not vesicular and not urticarial. No erythema. No pallor.  Multiple tattoos, including what of an owl.  She does have a lot of excoriation marks present.  No dermatographia present.   Psychiatric: She has a normal mood and affect.     Diagnostic studies:       Allergy Studies:    Airborne Adult Perc - 09/09/19 1354    Time Antigen Placed   1354    Allergen Manufacturer  Lavella Hammock    Location  Back    Number of Test  59    Panel 1  Select    1. Control-Buffer 50% Glycerol  Negative    3. Albumin saline  Negative    4. Murphy  Negative    5. Guatemala  Negative    6. Johnson  Negative    7. Kentucky Blue  2+    8. Meadow Fescue  Negative    9. Perennial Rye  2+    10. Sweet Vernal  Negative    11. Timothy  Negative    12. Cocklebur  Negative    13. Burweed Marshelder  2+    14. Ragweed, short  Negative    15. Ragweed, Giant  Negative    16. Plantain,  English  Negative    17. Lamb's Quarters  Negative    18. Sheep Sorrell  Negative    19. Rough Pigweed  Negative    20. Marsh Elder, Rough  Negative    21. Mugwort, Common  Negative    22. Ash mix  Negative    23. Birch mix  Negative    24. Beech American  Negative    25. Box, Elder  Negative    26. Cedar, red  Negative    27. Cottonwood, [place]  Negative    28. Elm mix  Negative    29. Hickory mix  Negative    30. Maple mix  Negative    31. Oak, [place] mix  Negative    32. Pecan Pollen  Negative    33. Pine mix  Negative    34. Sycamore Eastern  2+    35. [place], Black Pollen  Negative    36. Alternaria alternata  Negative    37. Cladosporium Herbarum  Negative    38. Aspergillus mix  Negative    39. Penicillium mix  2+    40. Bipolaris sorokiniana (Helminthosporium)  Negative    41. Drechslera spicifera (Curvularia)  Negative    42. Mucor plumbeus  Negative    43. Fusarium moniliforme  Negative    44. Aureobasidium pullulans (pullulara)  Negative    45. Rhizopus oryzae  Negative    46. Botrytis cinera  Negative    47. Epicoccum nigrum  Negative    48. Phoma betae  Negative    49. Candida Albicans  2+    50. Trichophyton mentagrophytes  Negative    51. Mite, D Farinae  5,000 AU/ml  Negative    52. Mite, D Pteronyssinus  5,000 AU/ml  Negative    53. Cat Hair 10,000 BAU/ml  Negative    54.  Dog Epithelia  Negative    55. Mixed Feathers  Negative    56.  Horse Epithelia  Negative    57. Cockroach, German  Negative    58. Mouse  Negative    59. Tobacco Leaf  Negative     Food Perc - 09/09/19 1355    Time Antigen Placed  1355    Allergen Manufacturer  Lavella Hammock    Location  Back    Number of allergen test  10    Food  Select    1. Peanut  Negative    2. Soybean food  Negative    3. Wheat, whole  Negative    4. Sesame  Negative    5. Milk, cow  Negative    6. Egg White, chicken  Negative    7. Casein  Negative    8. Shellfish mix  Negative    9. Fish mix  Negative    10. Cashew  Negative       Allergy testing results were read and interpreted by myself, documented by clinical staff.         Salvatore Marvel, MD [facility]

## 2019-09-09 NOTE — Patient Instructions (Signed)
1. Chronic urticaria - Your history does not have any "red flags" such as fevers, joint pains, or permanent skin changes that would be concerning for a more serious cause of hives.  - Testing today showed sensitization to some pollens as well as some indoor molds. - Indoor mold exposure can cause some itching, but it would most commonly present with worsening congestion. - It is reassuring that you had a negative autoimmune work-up recently, aside from that ESR. - We are going to get another ESR today to make sure that has normalized. - We are also going to get a chronic urticaria panel as well as an alpha gal panel and thyroid antibodies. - We are going to get a serum tryptase as well to look for mast cell disease. - Chronic hives are often times a self limited process and will "burn themselves out" over 6-12 months, although this is not always the case.  - Start a prednisone dose pack provided.  - In the meantime, start suppressive dosing of antihistamines:   - Morning: Allegra (fexofenadine) 342m (two tablets+ Pepcid (famotidine) 275m - Evening: Zyrtec (cetirizine) 2069mtwo tablets) + Singulair (montelukast) 87m66mPepcid (famotidine) 20mg31mou can change this dosing at home, decreasing the dose as needed or increasing the dosing as needed.  - If you are not tolerating the medications or are tired of taking them every day, we can start treatment with a monthly injectable medication called Xolair.   2. Seasonal and perennial allergic rhinitis - Testing today showed: grasses, weeds, trees and indoor molds - Copy of test results provided.  - Avoidance measures provided. - Start taking: antihistamines as above as well as montelukast - You can use an extra dose of the antihistamine, if needed, for breakthrough symptoms.  - Consider nasal saline rinses 1-2 times daily to remove allergens from the nasal cavities as well as help with mucous clearance (this is especially helpful to do before the  nasal sprays are given)  3. Return in about 6 weeks (around 10/21/2019). This can be an in-person, a virtual Webex or a telephone follow up visit.   Please inform us of[facility] Emergency Department visits, hospitalizations, or changes in symptoms. Call us beKoreare going to the ED for breathing or allergy symptoms since we might be able to fit you in for a sick visit. Feel free to contact us anKoreaime with any questions, problems, or concerns.  It was a pleasure to meet you today!  Websites that have reliable patient information: 1. American Academy of Asthma, Allergy, and Immunology: www.aaaai.org 2. Food Allergy Research and Education (FARE): foodallergy.org 3. Mothers of Asthmatics: http://www.asthmacommunitynetwork.org 4. American College of Allergy, Asthma, and Immunology: www.acaai.org   COVID-19 Vaccine Information can be found at: [email or web address] related to vaccine distribution or appointments, please email [email or web address] or call [phone] "Like" us [email or web address] and Instagram for our latest updates!        Make sure you are registered to vote! If you have moved or changed any of your contact information, you will need to get this updated before voting!  In some cases, you MAY be able to register to vote online: [email or web address].iteducing Pollen Exposure  The American Academy of Allergy, Asthma and Immunology suggests the following steps to reduce your exposure to pollen during allergy seasons.    1. Do not hang sheets or clothing out to dry; pollen may collect on these items. 2. Do not mow  lawns or spend time around freshly cut grass; mowing stirs up pollen. 3. Keep windows closed at night.  Keep car windows closed while driving. 4. Minimize morning activities outdoors, a time when pollen counts are usually at their highest. 5. Stay indoors as much as  possible when pollen counts or humidity is high and on windy days when pollen tends to remain in the air longer. 6. Use air conditioning when possible.  Many air conditioners have filters that trap the pollen spores. 7. Use a HEPA room air filter to remove pollen form the indoor air you breathe.  Control of Mold Allergen   Mold and fungi can grow on a variety of surfaces provided certain temperature and moisture conditions exist.  Outdoor molds grow on plants, decaying vegetation and soil.  The major outdoor mold, Alternaria and Cladosporium, are found in very high numbers during hot and dry conditions.  Generally, a late Summer - Fall peak is seen for common outdoor fungal spores.  Rain will temporarily lower outdoor mold spore count, but counts rise rapidly when the rainy period ends.  The most important indoor molds are Aspergillus and Penicillium.  Dark, humid and poorly ventilated basements are ideal sites for mold growth.  The next most common sites of mold growth are the bathroom and the kitchen.  Indoor (Perennial) Mold Control   Positive indoor molds via skin testing: Penicillium and Candida  1. Maintain humidity below 50%. 2. Clean washable surfaces with 5% bleach solution. 3. Remove sources e.g. contaminated carpets.

## 2019-09-10 ENCOUNTER — Other Ambulatory Visit: Payer: Self-pay | Admitting: Obstetrics & Gynecology

## 2019-09-10 LAB — SEDIMENTATION RATE: Sed Rate: 49 mm/hr — ABNORMAL HIGH (ref 0–32)

## 2019-09-10 MED ORDER — NORELGESTROMIN-ETH ESTRADIOL 150-35 MCG/24HR TD PTWK: 1.0000 | MEDICATED_PATCH | TRANSDERMAL | 12 refills | Status: DC

## 2019-09-15 ENCOUNTER — Encounter: Payer: Self-pay | Admitting: Family Medicine

## 2019-09-18 ENCOUNTER — Telehealth: Payer: Self-pay

## 2019-09-18 ENCOUNTER — Telehealth (INDEPENDENT_AMBULATORY_CARE_PROVIDER_SITE_OTHER): Payer: No Typology Code available for payment source | Admitting: Family Medicine

## 2019-09-18 ENCOUNTER — Encounter: Payer: Self-pay | Admitting: Family Medicine

## 2019-09-18 VITALS — Temp 97.8°F | Ht 65.5 in | Wt 234.0 lb

## 2019-09-18 DIAGNOSIS — E669 Obesity, unspecified: Secondary | ICD-10-CM | POA: Diagnosis not present

## 2019-09-18 DIAGNOSIS — Z6838 Body mass index (BMI) 38.0-38.9, adult: Secondary | ICD-10-CM | POA: Diagnosis not present

## 2019-09-18 MED ORDER — SAXENDA 18 MG/3ML ~~LOC~~ SOPN: PEN_INJECTOR | SUBCUTANEOUS | 0 refills | Status: DC

## 2019-09-18 NOTE — Assessment & Plan Note (Signed)
Will trial saxenda in addition to continued diet and exercise efforts
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
## 2019-09-18 NOTE — Telephone Encounter (Signed)
Prior Authorization initiated via CoverMyMeds for Saxenda Key: [id number]

## 2019-09-22 ENCOUNTER — Encounter: Payer: Self-pay | Admitting: Family Medicine

## 2019-09-22 NOTE — Progress Notes (Signed)
Unable to lvm for 1 month f/u apt,sent letter

## 2019-09-23 ENCOUNTER — Encounter: Payer: Self-pay | Admitting: Family Medicine

## 2019-09-23 NOTE — Telephone Encounter (Signed)
PA Approved

## 2019-09-24 ENCOUNTER — Encounter: Payer: Self-pay | Admitting: Allergy & Immunology

## 2019-09-26 ENCOUNTER — Ambulatory Visit: Payer: No Typology Code available for payment source | Admitting: Family Medicine

## 2019-09-30 ENCOUNTER — Other Ambulatory Visit: Payer: Self-pay

## 2019-09-30 ENCOUNTER — Ambulatory Visit (INDEPENDENT_AMBULATORY_CARE_PROVIDER_SITE_OTHER): Payer: 59 | Admitting: Nurse Practitioner

## 2019-09-30 ENCOUNTER — Encounter: Payer: Self-pay | Admitting: Nurse Practitioner

## 2019-09-30 VITALS — BP 124/80 | HR 84 | Temp 98.4°F | Ht 65.0 in | Wt 228.2 lb

## 2019-09-30 DIAGNOSIS — E669 Obesity, unspecified: Secondary | ICD-10-CM

## 2019-09-30 DIAGNOSIS — Z6837 Body mass index (BMI) 37.0-37.9, adult: Secondary | ICD-10-CM

## 2019-09-30 DIAGNOSIS — K219 Gastro-esophageal reflux disease without esophagitis: Secondary | ICD-10-CM

## 2019-09-30 DIAGNOSIS — R700 Elevated erythrocyte sedimentation rate: Secondary | ICD-10-CM

## 2019-09-30 NOTE — Progress Notes (Signed)
This visit occurred during the SARS-CoV-2 public health emergency.  Safety protocols were in place, including screening questions prior to the visit, additional usage of staff PPE, and extensive cleaning of exam room while observing appropriate contact time as indicated for disinfecting solutions.  Subjective:     Patient ID: Emily Morales , female    DOB: [birth year] , 29 y.o.   MRN: [id number]   Chief Complaint  Patient presents with  . Establish Care    HPI  Here to establish care - she was going to [facility] in [place] (2 years).  Married, 2 children - oldest ADHD and youngest (immune deficiency - thymus swollen).    PMH - hypertension (took HCTZ caused her itching - started in Dec and stopped in Jan 25th), Insomnia (temazepam as needed) - 4 years. Depression (feels like resolved over the last year - had taken Seroquel).  Tried trintellix, prozac without relief.   [facility] - father - diabetes, mother healthy, paternal grandmother - Hypertension and paternal grandfather - hypertension.  2 sisters - healthy.   No current issues.  Her health maintenance is due in Sept  She is currently focusing on weight - has been on Saxenda since Friday.  Her weight has been 234 lbs at its highest, tolerating Saxenda well. She is on 0.6 mg, has not had any nausea. She has not drank a soda in the last 2-3 weeks.   She was having increase in swelling to her hands and feet. She had an elevated sed rate.  She feels the swelling to her hands and joint pain has improved.   She has been to a cardiologist 2 years ago after feeling chest discomfort and possible reflux.  She has just started walking maybe once a week.   Gastroesophageal Reflux She reports no abdominal pain or no chest pain. This is a chronic problem. The problem occurs rarely. Treatments tried: pepcid, omeprazole and caltrate in the past, she is taking pepcid as needed. The treatment provided mild relief.     Past Medical  History:  Diagnosis Date  . Abnormal Pap smear   . Anemia   . Anxiety   . Arthritis   . Chronic back pain   . Depression   . Dysrhythmia    irregular heartrate  . GERD (gastroesophageal reflux disease)   . Headache(784.0)   . Hx of chlamydia infection   . Ovarian cyst, right   . Perimenopausal   . Uterine fibroid      Family History  Problem Relation Age of Onset  . Diabetes Father   . Hypertension Father   . Other Son        reactive airway disease  . Asthma Son   . Heart murmur Son   . Depression Mother   . Anxiety disorder Mother   . Cancer Maternal Grandmother        lung  . Lung cancer Maternal Grandmother   . Heart disease Paternal Grandmother   . Hypertension Paternal Grandmother   . Liver disease Paternal Grandfather        etoh related  . Alcohol abuse Maternal Grandfather   . Colon cancer Neg Hx      Current Outpatient Medications:  .  Liraglutide -Weight Management (SAXENDA) 18 MG/3ML SOPN, Inject 0.6 mg into the skin daily for 7 days, THEN 1.2 mg daily for 7 days, THEN 1.8 mg daily for 7 days, THEN 2.4 mg daily for 7 days, THEN 3 mg daily., Disp: 3  mL, Rfl: 0 .  temazepam (RESTORIL) 15 MG capsule, Take 1 capsule (15 mg total) by mouth at bedtime as needed for sleep., Disp: 30 capsule, Rfl: 1   Allergies  Allergen Reactions  . Hctz [Hydrochlorothiazide] Itching  . Pollen Extract Cough  . Latex Rash  . Other Rash    Coconut causes a rash     Review of Systems  Constitutional: Negative.   Respiratory: Negative.   Cardiovascular: Negative.  Negative for chest pain, palpitations and leg swelling.  Gastrointestinal: Negative for abdominal pain.  Neurological: Negative for dizziness and headaches.  Psychiatric/Behavioral: Negative.      Today's Vitals   09/30/19 1051  BP: 124/80  Pulse: 84  Temp: 98.4 F (36.9 C)  TempSrc: Oral  Weight: 228 lb 3.2 oz (103.5 kg)  Height: '5\' 5"'  (1.651 m)  PainSc: 0-No pain   Body mass index is 37.97 kg/m.    Objective:  Physical Exam Constitutional:      General: She is not in acute distress.    Appearance: Normal appearance. She is obese.  Cardiovascular:     Rate and Rhythm: Normal rate and regular rhythm.     Pulses: Normal pulses.     Heart sounds: Normal heart sounds. No murmur.  Pulmonary:     Effort: Pulmonary effort is normal. No respiratory distress.     Breath sounds: Normal breath sounds.  Musculoskeletal:     Comments: Bilateral hands are puffy  Skin:    General: Skin is warm.     Capillary Refill: Capillary refill takes less than 2 seconds.  Neurological:     General: No focal deficit present.     Mental Status: She is alert and oriented to person, place, and time.     Cranial Nerves: No cranial nerve deficit.  Psychiatric:        Mood and Affect: Mood normal.        Behavior: Behavior normal.        Thought Content: Thought content normal.        Judgment: Judgment normal.         Assessment And Plan:     1. Gastroesophageal reflux disease without esophagitis  Chronic, stable  2. Obesity (BMI 35.0-39.9 without comorbidity)  Chronic, currently on Saxenda and has lost 4 lbs in the last 3 weeks  Will follow up in 8 weeks  3. Elevated sed rate  This has been ongoing with her previous provider which has been worked up by her previous provider  She had a referral to Rheumatology but has not gone, pending labs will refer again.  She does have puffiness to her hands bilaterally - Sed Rate (ESR)   Minette Brine, FNP    THE PATIENT IS ENCOURAGED TO PRACTICE SOCIAL DISTANCING DUE TO THE COVID-19 PANDEMIC.

## 2019-10-01 ENCOUNTER — Other Ambulatory Visit: Payer: Self-pay | Admitting: Nurse Practitioner

## 2019-10-01 ENCOUNTER — Encounter: Payer: Self-pay | Admitting: Nurse Practitioner

## 2019-10-01 DIAGNOSIS — R700 Elevated erythrocyte sedimentation rate: Secondary | ICD-10-CM

## 2019-10-01 LAB — SEDIMENTATION RATE: Sed Rate: 61 mm/hr — ABNORMAL HIGH (ref 0–32)

## 2019-10-03 NOTE — Progress Notes (Signed)
Pt is no longer a pt at [facility] family practice.

## 2019-10-08 ENCOUNTER — Encounter: Payer: Self-pay | Admitting: Nurse Practitioner

## 2019-10-08 ENCOUNTER — Ambulatory Visit: Payer: 59 | Admitting: Nurse Practitioner

## 2019-10-08 VITALS — BP 122/82 | HR 96 | Temp 98.1°F | Wt 227.0 lb

## 2019-10-08 DIAGNOSIS — T8069XA Other serum reaction due to other serum, initial encounter: Secondary | ICD-10-CM

## 2019-10-08 DIAGNOSIS — T8062XA Other serum reaction due to vaccination, initial encounter: Secondary | ICD-10-CM

## 2019-10-08 MED ORDER — TRIAMCINOLONE ACETONIDE 40 MG/ML IJ SUSP
60.0000 mg | Freq: Once | INTRAMUSCULAR | Status: AC
  Administered 2019-10-08: 60 mg via INTRAMUSCULAR

## 2019-10-08 NOTE — Progress Notes (Signed)
This visit occurred during the SARS-CoV-2 public health emergency.  Safety protocols were in place, including screening questions prior to the visit, additional usage of staff PPE, and extensive cleaning of exam room while observing appropriate contact time as indicated for disinfecting solutions.  Subjective:     Patient ID: Emily Morales , female    DOB: 04-13-1991 , 29 y.o.   MRN: [id number]   Chief Complaint  Patient presents with  . Allergic Reaction    patient has broken out in a rash 2 weeks after getting covid shot. she stated it has increased in size,warm to touch and is really itchy    HPI  She had covid vaccine (Moderna) 2 weeks ago to left arm, started off small and has now grown and is itchy.  She has not taken benadryl. She took allegra yesterday.  Denies shortness of breath or chest pain.  Had done at [facility].  Allergic Reaction This is a chronic problem. The current episode started more than 1 week ago. The problem occurs constantly. The problem has been gradually worsening since onset. The problem is moderate. Associated with: covid vaccine. Associated symptoms include a rash (on left arm from her covid injection).     Past Medical History:  Diagnosis Date  . Abnormal Pap smear   . Anemia   . Anxiety   . Arthritis   . Chronic back pain   . Depression   . Dysrhythmia    irregular heartrate  . GERD (gastroesophageal reflux disease)   . Headache(784.0)   . Hx of chlamydia infection   . Ovarian cyst, right   . Perimenopausal   . Uterine fibroid      Family History  Problem Relation Age of Onset  . Diabetes Father   . Hypertension Father   . Other Son        reactive airway disease  . Asthma Son   . Heart murmur Son   . Depression Mother   . Anxiety disorder Mother   . Cancer Maternal Grandmother        lung  . Lung cancer Maternal Grandmother   . Heart disease Paternal Grandmother   . Hypertension Paternal Grandmother   . Liver disease  Paternal Grandfather        etoh related  . Alcohol abuse Maternal Grandfather   . Colon cancer Neg Hx      Current Outpatient Medications:  .  Liraglutide -Weight Management (SAXENDA) 18 MG/3ML SOPN, Inject 0.6 mg into the skin daily for 7 days, THEN 1.2 mg daily for 7 days, THEN 1.8 mg daily for 7 days, THEN 2.4 mg daily for 7 days, THEN 3 mg daily., Disp: 3 mL, Rfl: 0 .  temazepam (RESTORIL) 15 MG capsule, Take 1 capsule (15 mg total) by mouth at bedtime as needed for sleep., Disp: 30 capsule, Rfl: 1   Allergies  Allergen Reactions  . Hctz [Hydrochlorothiazide] Itching  . Pollen Extract Cough  . Latex Rash  . Other Rash    Coconut causes a rash     Review of Systems  Constitutional: Negative.   Respiratory: Negative.   Cardiovascular: Negative.   Skin: Positive for rash (on left arm from her covid injection).     Today's Vitals   10/08/19 1041  BP: 122/82  Pulse: 96  Temp: 98.1 F (36.7 C)  TempSrc: Oral  Weight: 227 lb (103 kg)  PainSc: 0-No pain   Body mass index is 37.77 kg/m.   Objective:  Physical  Exam Constitutional:      Appearance: Normal appearance.  Cardiovascular:     Rate and Rhythm: Normal rate.     Pulses: Normal pulses.     Heart sounds: Normal heart sounds. No murmur.  Pulmonary:     Effort: Pulmonary effort is normal. No respiratory distress.     Breath sounds: Normal breath sounds.  Skin:    Findings: Erythema (left arm, drew marking to determine size) and rash (left arm circular, mildly warm to touch) present.  Neurological:     General: No focal deficit present.     Mental Status: She is alert and oriented to person, place, and time.         Assessment And Plan:     1. Allergic reaction to vaccine  Erythema to left arm slightly warm to touch this occurred 3 weeks after injection so this may be a post vaccine response to covid vaccine  Negative for shortness of breath  Encouraged to do cool compresses. - triamcinolone  acetonide (KENALOG-40) injection 60 mg   Minette Brine, FNP    THE PATIENT IS ENCOURAGED TO PRACTICE SOCIAL DISTANCING DUE TO THE COVID-19 PANDEMIC.

## 2019-10-08 NOTE — Progress Notes (Deleted)
BH MD/PA/NP OP Progress Note  10/08/2019 9:46 AM ENEDELIA VARELA  MRN:  [id number]  Chief Complaint:  HPI:  - She was started on temazepam at the visit with Dr. Toy Care. Quetiapine was discontinued.  Visit Diagnosis: No diagnosis found.  Past Psychiatric History: Please see initial evaluation for full details. I have reviewed the history. No updates at this time.     Past Medical History:  Past Medical History:  Diagnosis Date  . Abnormal Pap smear   . Anemia   . Anxiety   . Arthritis   . Chronic back pain   . Depression   . Dysrhythmia    irregular heartrate  . GERD (gastroesophageal reflux disease)   . Headache(784.0)   . Hx of chlamydia infection   . Ovarian cyst, right   . Perimenopausal   . Uterine fibroid     Past Surgical History:  Procedure Laterality Date  . BIOPSY  11/14/2016   Procedure: BIOPSY;  Surgeon: Danie Binder, MD;  Location: [facility];  Service: Endoscopy;;  duodenal gastric  . COLPOSCOPY    . ESOPHAGOGASTRODUODENOSCOPY (EGD) WITH PROPOFOL N/A 11/14/2016   Dr. Oneida Alar: Mild gastritis, normal esophagus, biopsy showed inflammation of the small bowel, gastric biopsies with no H. pylori.  . lipoma surgery    . MASS EXCISION N/A 02/28/2017   Procedure: EXCISION LIPOMA SCALP 3CM;  Surgeon: Aviva Signs, MD;  Location: [facility] ORS;  Service: General;  Laterality: N/A;  . UMBILICAL HERNIA REPAIR N/A 04/04/2019   Procedure: HERNIA REPAIR UMBILICAL ADULT WITH MESH;  Surgeon: Virl Cagey, MD;  Location: [facility] OR[facility];  Service: General;  Laterality: N/A;    Family Psychiatric History: Please see initial evaluation for full details. I have reviewed the history. No updates at this time.     Family History:  Family History  Problem Relation Age of Onset  . Diabetes Father   . Hypertension Father   . Other Son        reactive airway disease  . Asthma Son   . Heart murmur Son   . Depression Mother   . Anxiety disorder Mother   . Cancer Maternal  Grandmother        lung  . Lung cancer Maternal Grandmother   . Heart disease Paternal Grandmother   . Hypertension Paternal Grandmother   . Liver disease Paternal Grandfather        etoh related  . Alcohol abuse Maternal Grandfather   . Colon cancer Neg Hx     Social History:  Social History   Socioeconomic History  . Marital status: Married    Spouse name: Not on file  . Number of children: 2  . Years of education: Not on file  . Highest education level: Not on file  Occupational History  . Occupation: clinical care coordinator    Comment: [employer]  Tobacco Use  . Smoking status: Never Smoker  . Smokeless tobacco: Never Used  Substance and Sexual Activity  . Alcohol use: Yes  . Drug use: Yes    Types: Other-see comments    Comment: CBD drops  . Sexual activity: Yes    Partners: Male    Birth control/protection: None  Other Topics Concern  . Not on file  Social History Narrative  . Not on file   Social Determinants of Health   Financial Resource Strain:   . Difficulty of Paying Living Expenses: Not on file  Food Insecurity:   . Worried About Estate manager/land agent  of Food in the Last Year: Not on file  . Ran Out of Food in the Last Year: Not on file  Transportation Needs:   . Lack of Transportation (Medical): Not on file  . Lack of Transportation (Non-Medical): Not on file  Physical Activity:   . Days of Exercise per Week: Not on file  . Minutes of Exercise per Session: Not on file  Stress:   . Feeling of Stress : Not on file  Social Connections:   . Frequency of Communication with Friends and Family: Not on file  . Frequency of Social Gatherings with Friends and Family: Not on file  . Attends Religious Services: Not on file  . Active Member of Clubs or Organizations: Not on file  . Attends Archivist Meetings: Not on file  . Marital Status: Not on file    Allergies:  Allergies  Allergen Reactions  . Hctz [Hydrochlorothiazide] Itching  .  Pollen Extract Cough  . Latex Rash  . Other Rash    Coconut causes a rash    Metabolic Disorder Labs: No results found for: HGBA1C, MPG No results found for: PROLACTIN Lab Results  Component Value Date   CHOL 204 (H) 04/21/2019   TRIG 113 04/21/2019   HDL 36 (L) 04/21/2019   LDLCALC 147 (H) 04/21/2019   Lab Results  Component Value Date   TSH 3.540 04/21/2019   TSH 2.690 05/02/2018    Therapeutic Level Labs: No results found for: LITHIUM No results found for: VALPROATE No components found for:  CBMZ  Current Medications: Current Outpatient Medications  Medication Sig Dispense Refill  . Liraglutide -Weight Management (SAXENDA) 18 MG/3ML SOPN Inject 0.6 mg into the skin daily for 7 days, THEN 1.2 mg daily for 7 days, THEN 1.8 mg daily for 7 days, THEN 2.4 mg daily for 7 days, THEN 3 mg daily. 3 mL 0  . temazepam (RESTORIL) 15 MG capsule Take 1 capsule (15 mg total) by mouth at bedtime as needed for sleep. 30 capsule 1   No current facility-administered medications for this visit.     Musculoskeletal: Strength & Muscle Tone: N/A Gait & Station: N/A Patient leans: N/A  Psychiatric Specialty Exam: Review of Systems  There were no vitals taken for this visit.There is no height or weight on file to calculate BMI.  General Appearance: {Appearance:22683}  Eye Contact:  {BHH EYE CONTACT:22684}  Speech:  Clear and Coherent  Volume:  Normal  Mood:  {BHH MOOD:22306}  Affect:  {Affect (PAA):22687}  Thought Process:  Coherent  Orientation:  Full (Time, Place, and Person)  Thought Content: Logical   Suicidal Thoughts:  {ST/HT (PAA):22692}  Homicidal Thoughts:  {ST/HT (PAA):22692}  Memory:  Immediate;   Good  Judgement:  {Judgement (PAA):22694}  Insight:  {Insight (PAA):22695}  Psychomotor Activity:  Normal  Concentration:  Concentration: Good and Attention Span: Good  Recall:  Good  Fund of Knowledge: Good  Language: Good  Akathisia:  No  Handed:  Right  AIMS (if  indicated): not done  Assets:  Communication Skills Desire for Improvement  ADL's:  Intact  Cognition: WNL  Sleep:  {BHH GOOD/FAIR/POOR:22877}   Screenings: GAD-7     Video Visit from 12/13/2018 in [place] Visit from 08/15/2018 in [place]  Total GAD-7 Score  12  15    PHQ2-9     Office Visit from 09/30/2019 in [place] Visit from 04/21/2019 in [facility] Video Visit from 12/13/2018  in [place] Visit from 08/15/2018 in [place]  PHQ-2 Total Score  0  2  4  4   PHQ-9 Total Score  0  9  11  16        Assessment and Plan:  TIMAYA GOSA is a 29 y.o. year old female with a history of anxiety, depression, migraine, who presents for follow up appointment for No diagnosis found.  # MDD, mild, recurrent without psychotic features # Panic disorder  Patient reports depressive symptoms and panic attacks since the last visit.  Psychosocial stressors includes marital conflict, although she reports improvement in the relationship with her husband. She self discontinued fluoxetine without any side effect.  Also noted that she did have a trial of antidepressants in the past, which she was on only for short period and low-dose.  Provided psychoeducation of adherence to medication.  Will retry fluoxetine to target depression and panic attacks.  Discussed risk of serotonin syndrome in the context of using Imitrex.  Will lower the dose of quetiapine given she has had weight gain (uptitrated by PCP); will use this medication as adjunctive treatment for depression and anxiety.  Discussed potential metabolic side effect. She will greatly benefit from CBT; will make a referral.   Plan 1. Reinitiate fluoxetine 20 mg daily  2. Decrease quetiapine 100 mg at night (PCP increased to 150 mg since the last visit)  3. Referral to therapy 4. Next appointment: 10/23 at 9 AM for 30 mins,  video - discussed attendance policy  Past trials of medication:sertraline (sexual side effect, tried 50mg ), fluoxetine (10mg ), citalopram, escitalopram, bupropion (150 mg), Trintellix (sexual side effect), venlafaxine, Buspar, quetiapine, clonazepam  The patient demonstrates the following risk factors for suicide: Chronic risk factors for suicide include:psychiatric disorder ofdepression, anxiety. Acute risk factorsfor suicide include: family or marital conflict. Protective factorsfor this patient include: positive social support, responsibility to others (children, family), coping skills and hope for the future. Considering these factors, the overall suicide risk at this point appears to below. Patientisappropriate for outpatient follow up.  Norman Clay, MD 10/08/2019, 9:47 AM

## 2019-10-13 ENCOUNTER — Encounter: Payer: Self-pay | Admitting: Gastroenterology

## 2019-10-13 LAB — ALPHA-GAL PANEL
Alpha Gal IgE*: <0.1 kU/L (ref <0.10)
Beef (Bos spp) IgE: <0.1 kU/L (ref <0.35)
Class Interpretation: 0
Class Interpretation: 0
Pork (Sus spp) IgE: <0.1 kU/L (ref <0.35)

## 2019-10-13 LAB — CHRONIC URTICARIA: cu index: 6.7 (ref <10)

## 2019-10-13 LAB — THYROID ANTIBODIES
Thyroglobulin Antibody: <1 IU/mL (ref 0.0–0.9)
Thyroperoxidase Ab SerPl-aCnc: <9 IU/mL (ref 0–34)

## 2019-10-13 LAB — TRYPTASE: Tryptase: 4 ug/L (ref 2.2–13.2)

## 2019-10-16 ENCOUNTER — Telehealth (HOSPITAL_COMMUNITY): Payer: Self-pay | Admitting: Psychiatry

## 2019-10-16 ENCOUNTER — Other Ambulatory Visit: Payer: Self-pay

## 2019-10-16 ENCOUNTER — Ambulatory Visit (HOSPITAL_COMMUNITY): Payer: No Typology Code available for payment source | Admitting: Psychiatry

## 2019-10-16 NOTE — Telephone Encounter (Signed)
.  Sent link for video visit through [facility]. Patient did not sign in. Called the patient  twice for appointment scheduled today. She states that she forgot about this appointment and she is at work. She also does not need to reschedule this appointment as she is doing well.

## 2019-10-17 ENCOUNTER — Ambulatory Visit (HOSPITAL_COMMUNITY): Payer: No Typology Code available for payment source | Admitting: Psychiatry

## 2019-10-21 ENCOUNTER — Other Ambulatory Visit: Payer: Self-pay

## 2019-10-21 ENCOUNTER — Ambulatory Visit (INDEPENDENT_AMBULATORY_CARE_PROVIDER_SITE_OTHER): Payer: 59 | Admitting: Allergy & Immunology

## 2019-10-21 ENCOUNTER — Encounter: Payer: Self-pay | Admitting: Allergy & Immunology

## 2019-10-21 VITALS — BP 122/84 | HR 88 | Temp 97.6°F | Resp 20

## 2019-10-21 DIAGNOSIS — J3089 Other allergic rhinitis: Secondary | ICD-10-CM | POA: Diagnosis not present

## 2019-10-21 DIAGNOSIS — J302 Other seasonal allergic rhinitis: Secondary | ICD-10-CM | POA: Diagnosis not present

## 2019-10-21 DIAGNOSIS — L501 Idiopathic urticaria: Secondary | ICD-10-CM

## 2019-10-21 DIAGNOSIS — L508 Other urticaria: Secondary | ICD-10-CM

## 2019-10-21 MED ORDER — EPINEPHRINE 0.3 MG/0.3ML IJ SOAJ: 0.3000 mg | Freq: Once | INTRAMUSCULAR | 2 refills | Status: AC

## 2019-10-21 MED ORDER — OMALIZUMAB 150 MG/ML ~~LOC~~ SOSY
150.0000 mg | PREFILLED_SYRINGE | Freq: Once | SUBCUTANEOUS | Status: AC
  Administered 2019-10-21: 150 mg via SUBCUTANEOUS

## 2019-10-21 NOTE — Progress Notes (Signed)
FOLLOW UP  Date of Service/Encounter:  10/21/19   Assessment:   Chronic urticaria - in the setting of an elevated ESR in December 2020  Seasonal and perennial allergic rhinitis (grasses, weeds, trees and indoor molds)   Ms. Emily Morales presents for follow-up visit. Unfortunately, her urticaria continue to be a problem. She is even having sedation to Allegra, which crosses the blood-brain barrier and the least of all the second-generation antihistamines. Therefore, she is having a difficult time controlling her symptoms without being fatigued all day. We went ahead and gave her a sample of 150 mg of Xolair today in clinic. She tolerated this well without adverse event. Consent was signed and an anaphylaxis management plan was provided. [name] will reach out to her to discuss the approval process. I would like to start her on 300 mg of Xolair every 4 weeks.  Plan/Recommendations:   1. Chronic urticaria - with side effects to antihistamines, including increased sedation - Sample of Xolair provided today. - [name] will reach out to discuss the approval process. - Most patients pay $5 or less per month for the injection. - You will not be able to get off of your antihistamines immediately, but hopefully over the next three months.  - Xolair consent signed today.  - EpiPen script provided.   2. Return in about 3 months (around 01/21/2020). This can be an in-person, a virtual Webex or a telephone follow up visit.   Subjective:   Emily Morales is a 29 y.o. female presenting today for follow up of  Chief Complaint  Patient presents with  . Follow-up  . Urticaria    Emily Morales has a history of the following: Patient Active Problem List   Diagnosis Date Noted  . MDD (major depressive disorder), recurrent episode, mild ([place]) 08/19/2019  . Panic disorder 08/19/2019  . Epigastric hernia 03/17/2019  . Insomnia 08/23/2018  . Obesity (BMI 35.0-39.9 without comorbidity)  08/23/2018  . Atypical chest pain 05/08/2017  . Benign lipomatous neoplasm of skin and subcutaneous tissue of head, face and neck   . Dyspepsia   . Anemia 11/07/2016  . GERD (gastroesophageal reflux disease) 11/07/2016  . Constipation 11/07/2016  . Encounter for IUD insertion liletta 12/10/2015  . Anxiety and depression 11/17/2015  . Umbilical hernia without obstruction and without gangrene 09/08/2015  . Oligouria 08/17/2015  . Mild dysplasia of cervix 03/26/2014    History obtained from: chart review and patient.  Emily Morales is a 29 y.o. female presenting for a follow up visit. She was last seen in February 2021. At that time, we started her on suppressive antihistamine doses as well as montelukast. She had environmental allergy testing that was positive to grasses, weeds, trees, and indoor molds. We started her on antihistamines as well as the montelukast for the hives.  Since last visit, she has mostly done well. She continues to have urticarial outbreaks, although she admits this is likely because she does not take her medications exactly as prescribed. She has sedation from [place] as well as the Zyrtec. The Singulair and the Pepcid are tolerated well, but she has a hard time remembering to take her meds in general. She is interested in advancing treatment to [place].   From an allergic rhinitis perspective, her symptoms are well controlled. This was never a major part of her clinical picture.  She has been staying busy at work. 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  Otherwise, there have been no changes to her past medical history, surgical history, family history, or social history.    Review of Systems  Constitutional: Negative.  Negative for chills, fever, malaise/fatigue and weight loss.  HENT: Negative.  Negative for congestion, ear discharge, ear pain and sore throat.   Eyes: Negative  for pain, discharge and redness.  Respiratory: Negative for cough, sputum production, shortness of breath and wheezing.   Cardiovascular: Negative.  Negative for chest pain and palpitations.  Gastrointestinal: Negative for abdominal pain, heartburn, nausea and vomiting.  Skin: Positive for itching and rash.  Neurological: Negative for dizziness and headaches.  Endo/Heme/Allergies: Negative for environmental allergies. Does not bruise/bleed easily.       Objective:   Blood pressure 122/84, pulse 88, temperature 97.6 F (36.4 C), temperature source Temporal, resp. rate 20, SpO2 99 %. There is no height or weight on file to calculate BMI.   Physical Exam:  Physical Exam  Constitutional: She appears well-developed.  Pleasant female. Cooperative with the exam.  HENT:  Head: Normocephalic and atraumatic.  Right Ear: Tympanic membrane, external ear and ear canal normal.  Left Ear: Tympanic membrane, external ear and ear canal normal.  Nose: Mucosal edema and rhinorrhea present. No nasal deformity or septal deviation. No epistaxis. Right sinus exhibits no maxillary sinus tenderness and no frontal sinus tenderness. Left sinus exhibits no maxillary sinus tenderness and no frontal sinus tenderness.  Mouth/Throat: Uvula is midline and oropharynx is clear and moist. Mucous membranes are not pale and not dry.  Eyes: Pupils are equal, round, and reactive to light. Conjunctivae and EOM are normal. Right eye exhibits no chemosis and no discharge. Left eye exhibits no chemosis and no discharge. Right conjunctiva is not injected. Left conjunctiva is not injected.  Cardiovascular: Normal rate, regular rhythm and normal heart sounds.  Respiratory: Effort normal and breath sounds normal. No accessory muscle usage. No tachypnea. No respiratory distress. She has no wheezes. She has no rhonchi. She has no rales. She exhibits no tenderness.  Moving air well in all lung fields. No increased work of breathing.    Lymphadenopathy:    She has no cervical adenopathy.  Neurological: She is alert.  Skin: No abrasion, no petechiae and no rash noted. Rash is not papular, not vesicular and not urticarial. No erythema. No pallor.  She does have some excoriations present on the bilateral arms.  Psychiatric: She has a normal mood and affect.     Diagnostic studies: none  She did receive a dose of Xolair 150 mg in the clinic today. She tolerated this without adverse event. She was given an EpiPen and an anaphylaxis management plan. She is well aware of the signs and symptoms of anaphylaxis.    Salvatore Marvel, MD  [facility]

## 2019-10-21 NOTE — Patient Instructions (Addendum)
1. Chronic urticaria - with side effects to antihistamines, including increased sedation - Sample of Xolair provided today. - [name] will reach out to discuss the approval process. - Most patients pay $5 or less per month for the injection. - You will not be able to get off of your antihistamines immediately, but hopefully over the next three months.  - Xolair consent signed today.  - EpiPen script provided.   2. Return in about 3 months (around 01/21/2020). This can be an in-person, a virtual Webex or a telephone follow up visit.   Please inform us of any Emergency Department visits, hospitalizations, or changes in symptoms. Call us before going to the ED for breathing or allergy symptoms since we might be able to fit you in for a sick visit. Feel free to contact us anytime with any questions, problems, or concerns.  It was a pleasure to see you again today!  Websites that have reliable patient information: 1. American Academy of Asthma, Allergy, and Immunology: www.aaaai.org 2. Food Allergy Research and Education (FARE): foodallergy.org 3. Mothers of Asthmatics: http://www.asthmacommunitynetwork.org 4. American College of Allergy, Asthma, and Immunology: www.acaai.org   COVID-19 Vaccine Information can be found at: ShippingScam.co.uk For questions related to vaccine distribution or appointments, please email vaccine@[email or web address] .com or call 6137894236.     "Like" [place] on Facebook and Instagram for our latest updates!       HAPPY SPRING!  Make sure you are registered to vote! If you have moved or changed any of your contact information, you will need to get this updated before voting!  In some cases, you MAY be able to register to vote online: [email or web address]

## 2019-10-21 NOTE — Progress Notes (Signed)
Immunotherapy   Patient Details  Name: Emily Morales MRN: [id number] Date of Birth: 1991/04/19  10/21/2019  [name] started injections for  Xolair pre-filled syringe150 mg Frequency: every 4 weeks Epi-Pen:Prescription for Epi-Pen given  Per Dr. Ernst Bowler patient only waited 15 minutes in office post injection Consent signed and patient instructions given.   Rosalio Loud 10/21/2019, 6:29 PM

## 2019-10-22 ENCOUNTER — Telehealth: Payer: Self-pay | Admitting: *Deleted

## 2019-10-22 NOTE — Telephone Encounter (Signed)
Spoke to patient and advised will start approval, get copay card and submit to [facility] to fill and appt made for patients next injection

## 2019-10-22 NOTE — Telephone Encounter (Signed)
-----   Message from Valentina Shaggy, MD sent at 10/21/2019  6:53 PM EDT ----- New Xolair start for [facility]. We gave 150mg  but I would like to submit for 300mg  Q4 weeks. Thanks! She wants to get it in [place] on Fridays.

## 2019-10-31 ENCOUNTER — Ambulatory Visit: Payer: 59 | Attending: Internal Medicine

## 2019-10-31 ENCOUNTER — Ambulatory Visit
Admission: EM | Admit: 2019-10-31 | Discharge: 2019-10-31 | Disposition: A | Discharge status: Home / Self Care | Payer: 59 | Attending: Emergency Medicine | Admitting: Emergency Medicine

## 2019-10-31 DIAGNOSIS — S76011A Strain of muscle, fascia and tendon of right hip, initial encounter: Secondary | ICD-10-CM | POA: Diagnosis not present

## 2019-10-31 DIAGNOSIS — Z23 Encounter for immunization: Secondary | ICD-10-CM

## 2019-10-31 MED ORDER — PREDNISONE 10 MG (21) PO TBPK: ORAL_TABLET | Freq: Every day | ORAL | 0 refills | Status: DC

## 2019-10-31 NOTE — Progress Notes (Signed)
   Covid-19 Vaccination Clinic  Name:  THRESSA BAYLIFF    MRN: [id number] DOB: [birth year]  10/31/2019  Ms. Williams-Neal was observed post Covid-19 immunization for 15 minutes without incident. She was provided with Vaccine Information Sheet and instruction to access the V-Safe system.   [name] was instructed to call 911 with any severe reactions post vaccine: Marland Kitchen Difficulty breathing  . Swelling of face and throat  . A fast heartbeat  . A bad rash all over body  . Dizziness and weakness   Immunizations Administered    Name Date Dose VIS Date Route   Moderna COVID-19 Vaccine 10/31/2019 10:01 AM 0.5 mL 07/01/2019 Intramuscular   Manufacturer: Moderna   Lot: [id number]   [id number]

## 2019-10-31 NOTE — ED Provider Notes (Signed)
[facility]    CSN: [id number] Arrival date & time: 10/31/19  1312      History   Chief Complaint Chief Complaint  Patient presents with  . Hip Pain    HPI Emily Morales is a 29 y.o. female with history of obesity presenting for right hip pain.  Patient states this has been ongoing for 1 week.  Denying injury, trauma to the area.  Patient states that she received a Toradol injection the other day which helped for a few hours, then pain returned.  Patient describes pain as dull, achy, sometimes throbbing.  Worse with certain movements, particularly lunging or getting up and sitting down.  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  States she recently started this new job.  Has tried changing shoes last few days which is provided more cushion..  Patient has had bursitis in the past, states this feels different.  Denies clicking, numbness, weakness, back pain.    Past Medical History:  Diagnosis Date  . Abnormal Pap smear   . Anemia   . Anxiety   . Arthritis   . Chronic back pain   . Depression   . Dysrhythmia    irregular heartrate  . GERD (gastroesophageal reflux disease)   . Headache(784.0)   . Hx of chlamydia infection   . Ovarian cyst, right   . Perimenopausal   . Uterine fibroid     Patient Active Problem List   Diagnosis Date Noted  . MDD (major depressive disorder), recurrent episode, mild ([place]) 08/19/2019  . Panic disorder 08/19/2019  . Epigastric hernia 03/17/2019  . Insomnia 08/23/2018  . Obesity (BMI 35.0-39.9 without comorbidity) 08/23/2018  . Atypical chest pain 05/08/2017  . Benign lipomatous neoplasm of skin and subcutaneous tissue of head, face and neck   . Dyspepsia   . Anemia 11/07/2016  . GERD (gastroesophageal reflux disease) 11/07/2016  . Constipation 11/07/2016  . Encounter for IUD insertion liletta 12/10/2015  . Anxiety and depression 11/17/2015  . Umbilical hernia without obstruction and without  gangrene 09/08/2015  . Oligouria 08/17/2015  . Mild dysplasia of cervix 03/26/2014    Past Surgical History:  Procedure Laterality Date  . BIOPSY  11/14/2016   Procedure: BIOPSY;  Surgeon: Danie Binder, MD;  Location: [facility];  Service: Endoscopy;;  duodenal gastric  . COLPOSCOPY    . ESOPHAGOGASTRODUODENOSCOPY (EGD) WITH PROPOFOL N/A 11/14/2016   Dr. Oneida Alar: Mild gastritis, normal esophagus, biopsy showed inflammation of the small bowel, gastric biopsies with no H. pylori.  . lipoma surgery    . MASS EXCISION N/A 02/28/2017   Procedure: EXCISION LIPOMA SCALP 3CM;  Surgeon: Aviva Signs, MD;  Location: [facility] ORS;  Service: General;  Laterality: N/A;  . UMBILICAL HERNIA REPAIR N/A 04/04/2019   Procedure: HERNIA REPAIR UMBILICAL ADULT WITH MESH;  Surgeon: Virl Cagey, MD;  Location: [facility];  Service: General;  Laterality: N/A;    OB History    Gravida  2   Para  2   Term  2   Preterm  0   AB  0   Living  2     SAB  0   TAB  0   Ectopic  0   Multiple  0   Live Births  2            Home Medications    Prior to Admission medications   Medication Sig Start Date End Date  Taking? Authorizing Provider  Liraglutide -Weight Management (SAXENDA) 18 MG/3ML SOPN Inject 0.6 mg into the skin daily for 7 days, THEN 1.2 mg daily for 7 days, THEN 1.8 mg daily for 7 days, THEN 2.4 mg daily for 7 days, THEN 3 mg daily. 09/18/19 01/14/20 Yes Volney American, PA-C  montelukast (SINGULAIR) 10 MG tablet  09/11/19  Yes [provider]  predniSONE (STERAPRED UNI-PAK 21 TAB) 10 MG (21) TBPK tablet Take by mouth daily. Take steroid taper as written 10/31/19   Hall-Potvin, [place], PA-C  temazepam (RESTORIL) 15 MG capsule Take 1 capsule (15 mg total) by mouth at bedtime as needed for sleep. 08/19/19 10/31/19  [place] Crane, MD    Family History Family History  Problem Relation Age of Onset  . Diabetes Father   . Hypertension Father   . Other Son        reactive  airway disease  . Asthma Son   . Heart murmur Son   . Depression Mother   . Anxiety disorder Mother   . Cancer Maternal Grandmother        lung  . Lung cancer Maternal Grandmother   . Heart disease Paternal Grandmother   . Hypertension Paternal Grandmother   . Liver disease Paternal Grandfather        etoh related  . Alcohol abuse Maternal Grandfather   . Colon cancer Neg Hx     Social History Social History   Tobacco Use  . Smoking status: Never Smoker  . Smokeless tobacco: Never Used  Substance Use Topics  . Alcohol use: Yes  . Drug use: Yes    Types: Other-see comments    Comment: CBD drops     Allergies   Hctz [hydrochlorothiazide], Pollen extract, Latex, and Other   Review of Systems As per HPI   Physical Exam Triage Vital Signs ED Triage Vitals  Enc Vitals Group     BP 10/31/19 1323 125/85     Pulse Rate 10/31/19 1323 86     Resp 10/31/19 1323 19     Temp 10/31/19 1323 98.3 F (36.8 C)     Temp src --      SpO2 10/31/19 1323 97 %     Weight --      Height --      Head Circumference --      Peak Flow --      Pain Score 10/31/19 1322 9     Pain Loc --      Pain Edu? --      Excl. in [place]? --    No data found.  Updated Vital Signs BP 125/85   Pulse 86   Temp 98.3 F (36.8 C)   Resp 19   SpO2 97%   Visual Acuity Right Eye Distance:   Left Eye Distance:   Bilateral Distance:    Right Eye Near:   Left Eye Near:    Bilateral Near:     Physical Exam Constitutional:      General: She is not in acute distress.    Appearance: She is obese.  HENT:     Head: Normocephalic and atraumatic.  Eyes:     General: No scleral icterus.    Pupils: Pupils are equal, round, and reactive to light.  Cardiovascular:     Rate and Rhythm: Normal rate and regular rhythm.  Pulmonary:     Effort: Pulmonary effort is normal.  Musculoskeletal:     Lumbar back: Normal.  Right hip: Tenderness present. No deformity, bony tenderness or crepitus. Normal  range of motion. Decreased strength.     Left hip: Normal.       Legs:     Comments: Decreased strength secondary pain.  Worse with flexion.  Legs equal length, no pain with internal or external rotation  Skin:    Capillary Refill: Capillary refill takes less than 2 seconds.     Coloration: Skin is not jaundiced or pale.     Findings: No rash.  Neurological:     General: No focal deficit present.     Mental Status: She is alert and oriented to person, place, and time.      UC Treatments / Results  Labs (all labs ordered are listed, but only abnormal results are displayed) Labs Reviewed - No data to display  EKG   Radiology No results found.  Procedures Procedures (including critical care time)  Medications Ordered in UC Medications - No data to display  Initial Impression / Assessment and Plan / UC Course  I have reviewed the triage vital signs and the nursing notes.  Pertinent labs & imaging results that were available during my care of the patient were reviewed by me and considered in my medical decision making (see chart for details).     Patient without traumatic injury or bony tenderness: Radiography deferred.  Likely hip flexor strain second to increase activity and repeated sitting and standing.  Toradol provided minimally temporary relief.  Reviewed supportive measures outlined below.  Provided Ortho contact information for persistent worsening symptoms.  Provided work note.  Return precautions discussed, patient verbalized understanding and is agreeable to plan. Final Clinical Impressions(s) / UC Diagnoses   Final diagnoses:  Strain of hip flexor, right, initial encounter     Discharge Instructions     Do stretches. Take steroid as directed: 6-5-4-3-2-1 Follow up with ortho for persistent/worsening symptoms    ED Prescriptions    Medication Sig Dispense Auth. Provider   predniSONE (STERAPRED UNI-PAK 21 TAB) 10 MG (21) TBPK tablet Take by mouth daily.  Take steroid taper as written 21 tablet Hall-Potvin, [place], PA-C     I have reviewed the PDMP during this encounter.   Hall-Potvin, [place], [place] 10/31/19 1518

## 2019-10-31 NOTE — Discharge Instructions (Signed)
Do stretches. Take steroid as directed: 6-5-4-3-2-1 Follow up with ortho for persistent/worsening symptoms

## 2019-10-31 NOTE — ED Triage Notes (Signed)
Pt presents with complaints of right sided hip pain that she woke up with. Reports it has been going on for 1 week. Denies any injury to the area. Pt received a toradol injection yesterday that helped for 3 hours and the pain is now back. She attempted to get in with sports medicine and they were unable to see her.

## 2019-11-03 ENCOUNTER — Other Ambulatory Visit: Payer: Self-pay | Admitting: Family Medicine

## 2019-11-03 NOTE — Telephone Encounter (Signed)
Patient last established somewhere else according to chart review.

## 2019-11-04 ENCOUNTER — Other Ambulatory Visit: Payer: Self-pay

## 2019-11-04 MED ORDER — SAXENDA 18 MG/3ML ~~LOC~~ SOPN: PEN_INJECTOR | SUBCUTANEOUS | 0 refills | Status: DC

## 2019-11-06 ENCOUNTER — Other Ambulatory Visit: Payer: Self-pay | Admitting: *Deleted

## 2019-11-06 MED ORDER — XOLAIR 150 MG ~~LOC~~ SOLR: 300.0000 mg | SUBCUTANEOUS | 11 refills | Status: DC

## 2019-11-06 NOTE — Telephone Encounter (Signed)
Erx with approval and copay card info sent to [facility]. L/m for patient advising same

## 2019-11-11 ENCOUNTER — Encounter: Payer: Self-pay | Admitting: Gastroenterology

## 2019-11-11 ENCOUNTER — Telehealth: Payer: 59 | Admitting: Emergency Medicine

## 2019-11-11 DIAGNOSIS — N898 Other specified noninflammatory disorders of vagina: Secondary | ICD-10-CM

## 2019-11-11 MED ORDER — METRONIDAZOLE 500 MG PO TABS: 500.0000 mg | ORAL_TABLET | Freq: Two times a day (BID) | ORAL | 0 refills | Status: DC

## 2019-11-11 NOTE — Progress Notes (Signed)

## 2019-11-12 ENCOUNTER — Encounter: Payer: Self-pay | Admitting: Nurse Practitioner

## 2019-11-12 NOTE — Telephone Encounter (Signed)
Pt wants appt

## 2019-11-13 ENCOUNTER — Ambulatory Visit: Payer: Self-pay | Admitting: Family Medicine

## 2019-11-13 ENCOUNTER — Telehealth: Payer: Self-pay

## 2019-11-13 ENCOUNTER — Ambulatory Visit: Payer: Self-pay | Admitting: Nurse Practitioner

## 2019-11-13 ENCOUNTER — Ambulatory Visit (INDEPENDENT_AMBULATORY_CARE_PROVIDER_SITE_OTHER): Payer: 59 | Admitting: Nurse Practitioner

## 2019-11-13 ENCOUNTER — Other Ambulatory Visit: Payer: Self-pay

## 2019-11-13 ENCOUNTER — Encounter: Payer: Self-pay | Admitting: Nurse Practitioner

## 2019-11-13 VITALS — BP 118/78 | HR 68 | Temp 98.2°F | Ht 65.0 in | Wt 220.4 lb

## 2019-11-13 DIAGNOSIS — Z6836 Body mass index (BMI) 36.0-36.9, adult: Secondary | ICD-10-CM

## 2019-11-13 DIAGNOSIS — E669 Obesity, unspecified: Secondary | ICD-10-CM

## 2019-11-13 MED ORDER — PHENTERMINE HCL 15 MG PO CAPS: 15.0000 mg | ORAL_CAPSULE | ORAL | 1 refills | Status: DC

## 2019-11-13 MED FILL — PHENTERMINE 15 MG CAPSULE: 15 | 30 days supply | Qty: 30 | Fill #0

## 2019-11-13 NOTE — Progress Notes (Signed)
This visit occurred during the SARS-CoV-2 public health emergency.  Safety protocols were in place, including screening questions prior to the visit, additional usage of staff PPE, and extensive cleaning of exam room while observing appropriate contact time as indicated for disinfecting solutions.  Subjective:     Patient ID: Emily Morales , female    DOB: 1990/08/16 , 29 y.o.   MRN: [id number]   Chief Complaint  Patient presents with  . Weight Check    HPI  Here for weight check she had been on Saxenda which was approved however still $700 even with coupon.  She has not tried phentermine.  She does report a history of stress and anxiety a few years ago which caused irregular.  Has not tried any other medications for weight loss.  She is not eating as much carbs/pastas, no sweets. She is exercising 10 minutes a day daily.  She is walking or jumping on trampoline. She has recently signed up for a gym membership. She is drinking 3 - 16 oz bottles of water a day.  Wt Readings from Last 3 Encounters: 11/13/19 : 220 lb 6.4 oz (100 kg) 10/08/19 : 227 lb (103 kg) 09/30/19 : 228 lb 3.2 oz (103.5 kg)  Patient's last menstrual period was 11/02/2019 (approximate).  Goal weight to be 190 lbs - by July or August     Past Medical History:  Diagnosis Date  . Abnormal Pap smear   . Anemia   . Anxiety   . Arthritis   . Chronic back pain   . Depression   . Dysrhythmia    irregular heartrate  . GERD (gastroesophageal reflux disease)   . Headache(784.0)   . Hx of chlamydia infection   . Ovarian cyst, right   . Perimenopausal   . Uterine fibroid      Family History  Problem Relation Age of Onset  . Diabetes Father   . Hypertension Father   . Other Son        reactive airway disease  . Asthma Son   . Heart murmur Son   . Depression Mother   . Anxiety disorder Mother   . Cancer Maternal Grandmother        lung  . Lung cancer Maternal Grandmother   . Heart disease Paternal  Grandmother   . Hypertension Paternal Grandmother   . Liver disease Paternal Grandfather        etoh related  . Alcohol abuse Maternal Grandfather   . Colon cancer Neg Hx      Current Outpatient Medications:  .  metroNIDAZOLE (FLAGYL) 500 MG tablet, Take 1 tablet (500 mg total) by mouth 2 (two) times daily., Disp: 14 tablet, Rfl: 0 .  montelukast (SINGULAIR) 10 MG tablet, , Disp: , Rfl:  .  omalizumab (XOLAIR) 150 MG injection, Inject 300 mg into the skin every 28 (twenty-eight) days., Disp: 2 each, Rfl: 11 .  Liraglutide -Weight Management (SAXENDA) 18 MG/3ML SOPN, Inject 0.1 mLs (0.6 mg total) into the skin daily for 7 days, THEN 0.2 mLs (1.2 mg total) daily for 7 days, THEN 0.3 mLs (1.8 mg total) daily for 7 days, THEN 0.4 mLs (2.4 mg total) daily for 7 days, THEN 0.5 mLs (3 mg total) daily. (Patient not taking: Reported on 11/13/2019), Disp: 3 mL, Rfl: 0   Allergies  Allergen Reactions  . Hctz [Hydrochlorothiazide] Itching  . Pollen Extract Cough  . Latex Rash  . Other Rash    Coconut causes a rash  Review of Systems  Constitutional: Negative.   Respiratory: Negative.   Cardiovascular: Negative.  Negative for chest pain, palpitations and leg swelling.  Neurological: Negative for dizziness and headaches.  Psychiatric/Behavioral: Negative.      Today's Vitals   11/13/19 1133  BP: 118/78  Pulse: 68  Temp: 98.2 F (36.8 C)  TempSrc: Oral  Weight: 220 lb 6.4 oz (100 kg)  Height: 5\' 5"  (1.651 m)  PainSc: 0-No pain   Body mass index is 36.68 kg/m.   Objective:  Physical Exam Constitutional:      General: She is not in acute distress.    Appearance: Normal appearance. She is obese.  Cardiovascular:     Rate and Rhythm: Normal rate and regular rhythm.     Pulses: Normal pulses.     Heart sounds: Normal heart sounds. No murmur.  Pulmonary:     Effort: Pulmonary effort is normal. No respiratory distress.     Breath sounds: Normal breath sounds.  Neurological:      General: No focal deficit present.     Mental Status: She is alert and oriented to person, place, and time.  Psychiatric:        Mood and Affect: Mood normal.        Behavior: Behavior normal.        Thought Content: Thought content normal.        Judgment: Judgment normal.         Assessment And Plan:     1. Obesity (BMI 35.0-39.9 without comorbidity)  EKG done in 04/2019 with her hernia surgery heart rate slightly elevated however the 3 previous ones were normal and today her heart rate is 68 I will start her on phentermine, she is advised to make me aware if has heart palpitations or chest pain  ASK- Given permission to discuss weight today ASSESS - Body mass index is 36.68 kg/m., well tolerated, WAIST CIRCUMFERENCE 43 inches ADVISE  - on risk of cardiovascular disease currently has a diagnosis of hypertension, on medications. AGREE - to increase her physical activity to at least 15 minutes daily, increase water to one gallon water a day and goal to lose 6-8 lbs by next office visit.  ASSIST - she feels her barriers to losing weight is related to eating pastas and rice, she is starting to eat zucchini noodles and cauliflower rice.  - phentermine 15 MG capsule; Take 1 capsule (15 mg total) by mouth every morning.  Dispense: 30 capsule; Refill: 1   Minette Brine, FNP    THE PATIENT IS ENCOURAGED TO PRACTICE SOCIAL DISTANCING DUE TO THE COVID-19 PANDEMIC.

## 2019-11-13 NOTE — Telephone Encounter (Signed)
Patient had an appointment for Xolair Friday April 16th in [place], but the pharmacy doesn't have the script for her medication. It seems the prescription was put in Epic but I spoke to the pharmacy and they still don't have it. The patient has been rescheduled for 11/25/2019 in [place].

## 2019-11-14 ENCOUNTER — Ambulatory Visit: Payer: 59

## 2019-11-17 ENCOUNTER — Ambulatory Visit (HOSPITAL_BASED_OUTPATIENT_CLINIC_OR_DEPARTMENT_OTHER): Payer: 59 | Admitting: Pharmacist

## 2019-11-17 ENCOUNTER — Other Ambulatory Visit: Payer: Self-pay

## 2019-11-17 ENCOUNTER — Other Ambulatory Visit: Payer: Self-pay | Admitting: *Deleted

## 2019-11-17 DIAGNOSIS — Z79899 Other long term (current) drug therapy: Secondary | ICD-10-CM

## 2019-11-17 MED ORDER — XOLAIR 150 MG ~~LOC~~ SOLR: 300.0000 mg | SUBCUTANEOUS | 11 refills | Status: DC

## 2019-11-17 MED FILL — XOLAIR 150 MG SOLR: 150 | 28 days supply | Qty: 2 | Fill #0

## 2019-11-17 NOTE — Telephone Encounter (Signed)
Sent rx to Emily Morales again and advised patient of same and if she does not hear anything in next couple days to reach out to pharmacy and me if needed

## 2019-11-17 NOTE — Telephone Encounter (Signed)
Called [facility] and they are unable to find Xolair Rx for 4/8 so I have sent same again and reached out to patient and advised same. Told her that if she didn't hear from them in next couple days to let me know

## 2019-11-17 NOTE — Progress Notes (Signed)
   S: Patient presents for review of their specialty medication therapy.  Patient is currently taking Xolair for urticaria. Patient is managed by Dr. Ernst Bowler for this.   Adherence: confirms; took first injection 10/21/2019.  Efficacy: just had her first injection ~1 month ago.  Dosing:  Chronic idiopathic urticaria: SubQ: 150 or 300 mg every 4 weeks. Dosing is not dependent on serum IgE (free or total) level or body weight.  Dose adjustments: Renal: no dose adjustments  Hepatic: no dose adjustments  Toxicity: Severe hypersensitivity reaction or anaphylaxis: Discontinue treatment. Fever, arthralgia, and rash: Discontinue treatment if this constellation of symptoms occurs.  Drug-drug interactions: none  Monitoring: CV effects: none  Eosinophilia and vasculitis: none  Fever/arthralgia/rash: none  Hypersensitivity/Anaphylaxis: none  Malignant neoplasms: none    O:   Lab Results  Component Value Date   WBC 9.6 06/05/2019   HGB 11.2 06/05/2019   HCT 32.6 (L) 06/05/2019   MCV 80 06/05/2019   PLT 309 06/05/2019      Chemistry      Component Value Date/Time   NA 139 06/05/2019 0926   K 4.0 06/05/2019 0926   CL 105 06/05/2019 0926   CO2 19 (L) 06/05/2019 0926   BUN 14 06/05/2019 0926   CREATININE 0.76 06/05/2019 0926      Component Value Date/Time   CALCIUM 9.2 06/05/2019 0926   ALKPHOS 80 06/05/2019 0926   AST 10 06/05/2019 0926   ALT 30 06/05/2019 0926   BILITOT 0.3 06/05/2019 0926       A/P: 1. Medication review: patient currently on Xolair for urticaria. Reviewed the medication with the patient, including the following: Xolair, omalizumab, is a novel IgE blocker.  It appears to reduce rates of hospitalizations, ER visits and unscheduled physician visits due asthma exacerbations when added to standard therapy.  Studies also show a reduction in steroid requirements and improvement in quality of life.  Patient educated on purpose, proper use and potential adverse  effects of Xolair.  Following instruction patient verbalized understanding. Patient should always have an EpiPen readily available in the event of anaphylaxis. SubQ: For SubQ injection only; doses >150 mg should be divided over more than one injection site (eg, 225 mg or 300 mg administered as two injections, 375 mg administered as three injections); each injection site should be separated by ?1 inch. Do not inject into moles, scars, bruises, tender areas, or broken skin. Injections may take 5 to 10 seconds to administer (solution is slightly viscous). Administer only under direct medical supervision and observe patient for 2 hours after the first 3 injections and 30 minutes after subsequent injections Dellia Cloud 2015) or in accordance with individual institution policies and procedures.No recommendations for any changes at this time.   Benard Halsted, PharmD, [place] [phone]

## 2019-11-18 ENCOUNTER — Encounter: Payer: Self-pay | Admitting: Gastroenterology

## 2019-11-21 ENCOUNTER — Ambulatory Visit: Payer: 59 | Admitting: Gastroenterology

## 2019-11-23 ENCOUNTER — Telehealth: Payer: 59 | Admitting: Family

## 2019-11-23 DIAGNOSIS — J029 Acute pharyngitis, unspecified: Secondary | ICD-10-CM

## 2019-11-23 MED ORDER — FLUTICASONE PROPIONATE 50 MCG/ACT NA SUSP: 2.0000 | Freq: Every day | NASAL | 6 refills | Status: DC

## 2019-11-23 NOTE — Progress Notes (Signed)
We are sorry that you are not feeling well.  Here is how we plan to help!  Your symptoms indicate a likely viral infection (Pharyngitis).   Pharyngitis is inflammation in the back of the throat which can cause a sore throat, scratchiness and sometimes difficulty swallowing.   Pharyngitis is typically caused by a respiratory virus and will just run its course.  Please keep in mind that your symptoms could last up to 10 days.  For throat pain, we recommend over the counter oral pain relief medications such as acetaminophen or aspirin, or anti-inflammatory medications such as ibuprofen or naproxen sodium.  Topical treatments such as oral throat lozenges or sprays may be used as needed.  Avoid close contact with loved ones, especially the very young and elderly.  Remember to wash your hands thoroughly throughout the day as this is the number one way to prevent the spread of infection and wipe down door knobs and counters with disinfectant. I have sent you in a prescription of Flonase, nasal spray that you use two squirts in each nostril at bedtime.  After careful review of your answers, I would not recommend and antibiotic for your condition.  Antibiotics should not be used to treat conditions that we suspect are caused by viruses like the virus that causes the common cold or flu. However, some people can have Strep with atypical symptoms. You may need formal testing in clinic or office to confirm if your symptoms continue or worsen.  Providers prescribe antibiotics to treat infections caused by bacteria. Antibiotics are very powerful in treating bacterial infections when they are used properly.  To maintain their effectiveness, they should be used only when necessary.  Overuse of antibiotics has resulted in the development of super bugs that are resistant to treatment!    Home Care:  Only take medications as instructed by your medical team.  Do not drink alcohol while taking these medications.  A steam or  ultrasonic humidifier can help congestion.  You can place a towel over your head and breathe in the steam from hot water coming from a faucet.  Avoid close contacts especially the very young and the elderly.  Cover your mouth when you cough or sneeze.  Always remember to wash your hands.  Get Help Right Away If:  You develop worsening fever or throat pain.  You develop a severe head ache or visual changes.  Your symptoms persist after you have completed your treatment plan.  Make sure you  Understand these instructions.  Will watch your condition.  Will get help right away if you are not doing well or get worse.  Your e-visit answers were reviewed by a board certified advanced clinical practitioner to complete your personal care plan.  Depending on the condition, your plan could have included both over the counter or prescription medications.  If there is a problem please reply  once you have received a response from your provider.  Your safety is important to [place].  If you have drug allergies check your prescription carefully.    You can use MyChart to ask questions about todays visit, request a non-urgent call back, or ask for a work or school excuse for 24 hours related to this e-Visit. If it has been greater than 24 hours you will need to follow up with your provider, or enter a new e-Visit to address those concerns.  You will get an e-mail in the next two days asking about your experience.  I hope that  that your e-visit has been valuable and will speed your recovery. Thank you for using e-visits.  Approximately 5 minutes was spent documenting and reviewing patient's chart.    

## 2019-11-25 ENCOUNTER — Other Ambulatory Visit: Payer: Self-pay

## 2019-11-25 ENCOUNTER — Ambulatory Visit (INDEPENDENT_AMBULATORY_CARE_PROVIDER_SITE_OTHER): Payer: 59

## 2019-11-25 DIAGNOSIS — L501 Idiopathic urticaria: Secondary | ICD-10-CM

## 2019-11-25 DIAGNOSIS — L508 Other urticaria: Secondary | ICD-10-CM

## 2019-11-25 MED ORDER — OMALIZUMAB 150 MG ~~LOC~~ SOLR
300.0000 mg | SUBCUTANEOUS | Status: DC
  Administered 2019-11-25 – 2019-12-24 (×2): 300 mg via SUBCUTANEOUS

## 2019-12-04 ENCOUNTER — Ambulatory Visit: Payer: 59 | Admitting: Nurse Practitioner

## 2019-12-09 ENCOUNTER — Other Ambulatory Visit: Payer: Self-pay | Admitting: Obstetrics & Gynecology

## 2019-12-16 ENCOUNTER — Telehealth: Payer: Self-pay

## 2019-12-16 ENCOUNTER — Encounter: Payer: Self-pay | Admitting: Nurse Practitioner

## 2019-12-16 ENCOUNTER — Other Ambulatory Visit: Payer: Self-pay

## 2019-12-16 ENCOUNTER — Telehealth (INDEPENDENT_AMBULATORY_CARE_PROVIDER_SITE_OTHER): Payer: 59 | Admitting: Nurse Practitioner

## 2019-12-16 VITALS — BP 135/82 | HR 84 | Temp 98.4°F | Wt 220.5 lb

## 2019-12-16 DIAGNOSIS — R21 Rash and other nonspecific skin eruption: Secondary | ICD-10-CM | POA: Diagnosis not present

## 2019-12-16 MED ORDER — EUCRISA 2 % EX OINT: 1.0000 "application " | TOPICAL_OINTMENT | Freq: Two times a day (BID) | CUTANEOUS | 2 refills | Status: DC

## 2019-12-16 MED ORDER — PREDNISONE 10 MG (21) PO TBPK: ORAL_TABLET | ORAL | 0 refills | Status: DC

## 2019-12-16 NOTE — Telephone Encounter (Signed)
..   Pt understands that although there may be some limitations with this type of visit, we will take all precautions to reduce any security or privacy concerns.  Pt understands that this will be treated like an in office visit and we will file with pt's insurance, and there may be a patient responsible charge related to this service. ? ?

## 2019-12-16 NOTE — Progress Notes (Signed)
Virtual Visit via Video   This visit type was conducted due to national recommendations for restrictions regarding the COVID-19 Pandemic (e.g. social distancing) in an effort to limit this patient's exposure and mitigate transmission in our community.  Due to her co-morbid illnesses, this patient is at least at moderate risk for complications without adequate follow up.  This format is felt to be most appropriate for this patient at this time.  All issues noted in this document were discussed and addressed.  A limited physical exam was performed with this format.    This visit type was conducted due to national recommendations for restrictions regarding the COVID-19 Pandemic (e.g. social distancing) in an effort to limit this patient's exposure and mitigate transmission in our community.  Patients identity confirmed using two different identifiers.  This format is felt to be most appropriate for this patient at this time.  All issues noted in this document were discussed and addressed.  No physical exam was performed (except for noted visual exam findings with Video Visits).    Date:  12/16/2019   ID:  Emily Morales, DOB 1991/06/07, MRN [id number]  Patient Location:  Home - spoke with Alfredia Ferguson  Provider location:   Office    Chief Complaint:  Rash  History of Present Illness:    Emily Morales is a 29 y.o. female who presents via video conferencing for a telehealth visit today.    The patient does not have symptoms concerning for COVID-19 infection (fever, chills, cough, or new shortness of breath).   Her son has a rash to his stomach and his lips, does not think it is   Rash This is a new problem. The current episode started today (When she woke up this morning had white bumps around her face mainly to chin.  ).     Past Medical History:  Diagnosis Date  . Abnormal Pap smear   . Anemia   . Anxiety   . Arthritis   . Chronic back pain   . Depression   .  Dysrhythmia    irregular heartrate  . GERD (gastroesophageal reflux disease)   . Headache(784.0)   . Hx of chlamydia infection   . Ovarian cyst, right   . Perimenopausal   . Uterine fibroid    Past Surgical History:  Procedure Laterality Date  . BIOPSY  11/14/2016   Procedure: BIOPSY;  Surgeon: Danie Binder, MD;  Location: [facility];  Service: Endoscopy;;  duodenal gastric  . COLPOSCOPY    . ESOPHAGOGASTRODUODENOSCOPY (EGD) WITH PROPOFOL N/A 11/14/2016   Dr. Oneida Alar: Mild gastritis, normal esophagus, biopsy showed inflammation of the small bowel, gastric biopsies with no H. pylori.  . lipoma surgery    . MASS EXCISION N/A 02/28/2017   Procedure: EXCISION LIPOMA SCALP 3CM;  Surgeon: Aviva Signs, MD;  Location: [facility];  Service: General;  Laterality: N/A;  . UMBILICAL HERNIA REPAIR N/A 04/04/2019   Procedure: HERNIA REPAIR UMBILICAL ADULT WITH MESH;  Surgeon: Virl Cagey, MD;  Location: [facility];  Service: General;  Laterality: N/A;     Current Meds  Medication Sig  . fluticasone (FLONASE) 50 MCG/ACT nasal spray Place 2 sprays into both nostrils daily.  . montelukast (SINGULAIR) 10 MG tablet   . omalizumab (XOLAIR) 150 MG injection Inject 300 mg into the skin every 28 (twenty-eight) days.  . phentermine 15 MG capsule Take 1 capsule (15 mg total) by mouth every morning.   Current Facility-Administered Medications for  the 12/16/19 encounter (Video Visit) with Minette Brine, FNP  Medication  . omalizumab Arvid Right) injection 300 mg     Allergies:   Hctz [hydrochlorothiazide], Meat [alpha-gal], Mixed grasses, Pollen extract, Latex, and Other   Social History   Tobacco Use  . Smoking status: Never Smoker  . Smokeless tobacco: Never Used  Substance Use Topics  . Alcohol use: Yes  . Drug use: Yes    Types: Other-see comments    Comment: CBD drops     Family Hx: The patient's family history includes Alcohol abuse in her maternal grandfather; Anxiety disorder in her mother;  Asthma in her son; Cancer in her maternal grandmother; Depression in her mother; Diabetes in her father; Heart disease in her paternal grandmother; Heart murmur in her son; Hypertension in her father and paternal grandmother; Liver disease in her paternal grandfather; Lung cancer in her maternal grandmother; Other in her son. There is no history of Colon cancer.  ROS:   Please see the history of present illness.    Review of Systems  Constitutional: Negative for weight loss.  Respiratory: Negative.   Cardiovascular: Negative.  Negative for palpitations.  Skin: Positive for rash. Negative for itching.  Neurological: Negative for dizziness and tingling.  Psychiatric/Behavioral: Negative.     All other systems reviewed and are negative.   Labs/Other Tests and Data Reviewed:    Recent Labs: 04/21/2019: TSH 3.540 06/05/2019: ALT 30; BUN 14; Creatinine, Ser 0.76; Hemoglobin 11.2; Platelets 309; Potassium 4.0; Sodium 139   Recent Lipid Panel Lab Results  Component Value Date/Time   CHOL 204 (H) 04/21/2019 09:16 AM   TRIG 113 04/21/2019 09:16 AM   HDL 36 (L) 04/21/2019 09:16 AM   LDLCALC 147 (H) 04/21/2019 09:16 AM    Wt Readings from Last 3 Encounters:  12/16/19 220 lb 7.4 oz (100 kg)  11/13/19 220 lb 6.4 oz (100 kg)  10/08/19 227 lb (103 kg)     Exam:    Vital Signs:  BP 135/82 (BP Location: Left Arm, Patient Position: Sitting, Cuff Size: Small)   Pulse 84   Temp 98.4 F (36.9 C) (Oral)   Wt 220 lb 7.4 oz (100 kg)   BMI 36.69 kg/m     Physical Exam  Constitutional: She is oriented to person, place, and time and well-developed, well-nourished, and in no distress. No distress.  Pulmonary/Chest: Effort normal.  Neurological: She is alert and oriented to person, place, and time.  Psychiatric: Mood, memory, affect and judgment normal.    ASSESSMENT & PLAN:    1. Rash and nonspecific skin eruption  Unable to see a distinguished rash due to being on video however her lower  part of her chin appears to be swollen and she is scratching her face during the visit.  Will treat with a short course of prednisone due to her history of allergies and the rash is on her face.   Also sent Eucrisa as long as covered by [employer] notes it is preferred. - Crisaborole (EUCRISA) 2 % OINT; Apply 1 application topically in the morning and at bedtime.  Dispense: 60 g; Refill: 2 - predniSONE (STERAPRED UNI-PAK 21 TAB) 10 MG (21) TBPK tablet; Take 2 tablets by mouth daily x 3 days  Dispense: 6 tablet; Refill: 0   COVID-19 Education: The signs and symptoms of COVID-19 were discussed with the patient and how to seek care for testing (follow up with PCP or arrange E-visit).  The importance of social distancing was discussed today.  Patient Risk:   After full review of this patients clinical status, I feel that they are at least moderate risk at this time.  Time:   Today, I have spent 8 minutes/ seconds with the patient with telehealth technology discussing above diagnoses.     Medication Adjustments/Labs and Tests Ordered: Current medicines are reviewed at length with the patient today.  Concerns regarding medicines are outlined above.   Tests Ordered: No orders of the defined types were placed in this encounter.   Medication Changes: Meds ordered this encounter  Medications  . Crisaborole (EUCRISA) 2 % OINT    Sig: Apply 1 application topically in the morning and at bedtime.    Dispense:  60 g    Refill:  2  . predniSONE (STERAPRED UNI-PAK 21 TAB) 10 MG (21) TBPK tablet    Sig: Take 2 tablets by mouth daily x 3 days    Dispense:  6 tablet    Refill:  0    Disposition:  Follow up prn  Signed, Minette Brine, FNP

## 2019-12-17 ENCOUNTER — Ambulatory Visit (INDEPENDENT_AMBULATORY_CARE_PROVIDER_SITE_OTHER): Payer: 59 | Admitting: Allergy & Immunology

## 2019-12-17 ENCOUNTER — Encounter: Payer: Self-pay | Admitting: Allergy & Immunology

## 2019-12-17 VITALS — BP 124/82 | HR 77 | Resp 18

## 2019-12-17 DIAGNOSIS — L508 Other urticaria: Secondary | ICD-10-CM | POA: Diagnosis not present

## 2019-12-17 DIAGNOSIS — T7840XD Allergy, unspecified, subsequent encounter: Secondary | ICD-10-CM | POA: Diagnosis not present

## 2019-12-17 DIAGNOSIS — J3089 Other allergic rhinitis: Secondary | ICD-10-CM

## 2019-12-17 DIAGNOSIS — J302 Other seasonal allergic rhinitis: Secondary | ICD-10-CM | POA: Diagnosis not present

## 2019-12-17 MED ORDER — CETIRIZINE HCL 10 MG PO TABS
ORAL_TABLET | ORAL | 0 refills | Status: DC
Start: 2019-12-17 — End: 2020-06-01

## 2019-12-17 NOTE — Patient Instructions (Addendum)
1. Chronic urticaria - with side effects to antihistamines, including increased sedation - Continue with Xolair every 28 days.  - We can increase to every two weeks after three doses if needed.  - Continue with the prednisone burst today. - Start the Zyrtec two tablets twice daily for three days and then one tablet twice daily for two weeks. - Hopefully, we will get these reactions under control and you will not have these issues.  - Maybe these outdoor allergens are more relevant that we realized, so you could consider allergy shots to re-train the immune system (these will take longer to work, but it might be a good long term option).   2. Return in about 3 months (around 03/18/2020). This can be an in-person, a virtual Webex or a telephone follow up visit.   Please inform us of any Emergency Department visits, hospitalizations, or changes in symptoms. Call us before going to the ED for breathing or allergy symptoms since we might be able to fit you in for a sick visit. Feel free to contact us anytime with any questions, problems, or concerns.  It was a pleasure to see you again today!  Websites that have reliable patient information: 1. American Academy of Asthma, Allergy, and Immunology: www.aaaai.org 2. Food Allergy Research and Education (FARE): foodallergy.org 3. Mothers of Asthmatics: http://www.asthmacommunitynetwork.org 4. American College of Allergy, Asthma, and Immunology: www.acaai.org   COVID-19 Vaccine Information can be found at: ShippingScam.co.uk For questions related to vaccine distribution or appointments, please email vaccine@[place] .[email or web address] or call [phone] [phone].     "Like" [place] on Facebook and Instagram for our latest updates!       HAPPY SPRING!  Make sure you are registered to vote! If you have moved or changed any of your contact information, you will need to get this updated before voting!  In some  cases, you MAY be able to register to vote online: [email or web address]

## 2019-12-17 NOTE — Progress Notes (Signed)
FOLLOW UP  Date of Service/Encounter:  12/17/19   Assessment:   Chronic urticaria- in the setting of an elevated ESR in December 2020  Seasonal and perennial allergic rhinitis(grasses, weeds, trees and indoor molds)  Allergic reaction - unknown trigger (improving on prednisone)   Plan/Recommendations:   1. Chronic urticaria - with side effects to antihistamines, including increased sedation - Continue with Xolair every 28 days.  - We can increase to every two weeks after three doses if needed.  - Continue with the prednisone burst today. - Start the Zyrtec two tablets twice daily for three days and then one tablet twice daily for two weeks. - Hopefully, we will get these reactions under control and you will not have these issues.  - Maybe these outdoor allergens are more relevant that we realized, so you could consider allergy shots to re-train the immune system (these will take longer to work, but it might be a good long term option).   2. Return in about 3 months (around 03/18/2020). This can be an in-person, a virtual Webex or a telephone follow up visit.  Subjective:   Emily Morales is a 29 y.o. female presenting today for follow up of  Chief Complaint  Patient presents with  . Rash    Face breakout since 12/16/2019    Emily Morales has a history of the following: Patient Active Problem List   Diagnosis Date Noted  . MDD (major depressive disorder), recurrent episode, mild ([place]) 08/19/2019  . Panic disorder 08/19/2019  . Epigastric hernia 03/17/2019  . Insomnia 08/23/2018  . Obesity (BMI 35.0-39.9 without comorbidity) 08/23/2018  . Atypical chest pain 05/08/2017  . Benign lipomatous neoplasm of skin and subcutaneous tissue of head, face and neck   . Dyspepsia   . Anemia 11/07/2016  . GERD (gastroesophageal reflux disease) 11/07/2016  . Constipation 11/07/2016  . Encounter for IUD insertion liletta 12/10/2015  . Anxiety and depression  11/17/2015  . Umbilical hernia without obstruction and without gangrene 09/08/2015  . Oligouria 08/17/2015  . Mild dysplasia of cervix 03/26/2014    History obtained from: chart review and patient.  Emily Morales is a 29 y.o. female presenting for a sick visit.  She has a history of urticaria with angioedema as well as perennial and seasonal allergic rhinitis.  She had testing in February 2021 that was positive to grasses, weeds, trees, and indoor molds.  We started her on suppressive antihistamines as well as montelukast.  She was tired of taking these medications and frankly was not good at remembering, so in March 2021 we added on Xolair.  Her allergic rhinitis symptoms were never a huge part of her clinical picture.  Since last visit, she has mostly done well. However, yesterday she woke up with facial swelling. She had intense itching as well. She knows that she has the indoor mold allergies and apparently there is mold in the crawl space at her rental home. She is already on prednisone and it seems to be helping. Benadryl helps with the itching.   In general the Xolair has helped with the itching.  She is happy with how well she is doing overall.  There was another episode where she used her EpiPen at work. She had eaten a chicken lettuce wrap with a "special sauce". She was having trouble talking and words would not come out. She decided to refrain from [place].  She had testing to the most common foods that was completely negative at her first  visit.  Otherwise, there have been no changes to her past medical history, surgical history, family history, or social history.    Review of Systems  Constitutional: Negative.  Negative for chills, fever, malaise/fatigue and weight loss.  HENT: Negative.  Negative for congestion, ear discharge and ear pain.   Eyes: Negative for pain, discharge and redness.  Respiratory: Negative for cough, sputum production, shortness of breath and wheezing.     Cardiovascular: Negative.  Negative for chest pain and palpitations.  Gastrointestinal: Negative for abdominal pain, constipation, diarrhea, heartburn, nausea and vomiting.  Skin: Positive for itching and rash.       Positive for swelling.  Neurological: Negative for dizziness and headaches.  Endo/Heme/Allergies: Negative for environmental allergies. Does not bruise/bleed easily.       Objective:   Blood pressure 124/82, pulse 77, resp. rate 18, SpO2 98 %. There is no height or weight on file to calculate BMI.   Physical Exam:  Physical Exam  Constitutional: She appears well-developed.  She does have some facial swelling noted.  This is more prominent around her eyes, but she has no problem with extraocular movements or eye pain.  No hives appreciated.  HENT:  Head: Normocephalic and atraumatic.  Right Ear: Tympanic membrane, external ear and ear canal normal.  Left Ear: Tympanic membrane and ear canal normal.  Nose: No mucosal edema, rhinorrhea, nasal deformity or septal deviation. No epistaxis. Right sinus exhibits no maxillary sinus tenderness and no frontal sinus tenderness. Left sinus exhibits no maxillary sinus tenderness and no frontal sinus tenderness.  Mouth/Throat: Uvula is midline and oropharynx is clear and moist. Mucous membranes are not pale and not dry.  Eyes: Pupils are equal, round, and reactive to light. Conjunctivae and EOM are normal. Right eye exhibits no chemosis and no discharge. Left eye exhibits no chemosis and no discharge. Right conjunctiva is not injected. Left conjunctiva is not injected.  Cardiovascular: Normal rate, regular rhythm and normal heart sounds.  Respiratory: Effort normal and breath sounds normal. No accessory muscle usage. No tachypnea. No respiratory distress. She has no wheezes. She has no rhonchi. She has no rales. She exhibits no tenderness.  Lymphadenopathy:    She has no cervical adenopathy.  Neurological: She is alert.  Skin: No  abrasion, no petechiae and no rash noted. Rash is not papular, not vesicular and not urticarial. No erythema. No pallor.  She does have some hyperpigmented lesions on her right arm where the Xolair was injected, although she has no similar lesions on her left arm where she had Xolair injections as well.  Psychiatric: She has a normal mood and affect.     Diagnostic studies: none     Salvatore Marvel, MD  [facility]

## 2019-12-18 ENCOUNTER — Encounter: Payer: Self-pay | Admitting: Nurse Practitioner

## 2019-12-18 ENCOUNTER — Ambulatory Visit (INDEPENDENT_AMBULATORY_CARE_PROVIDER_SITE_OTHER): Payer: 59 | Admitting: Obstetrics & Gynecology

## 2019-12-18 ENCOUNTER — Encounter: Payer: Self-pay | Admitting: Obstetrics & Gynecology

## 2019-12-18 VITALS — BP 120/80 | HR 94 | Wt 215.0 lb

## 2019-12-18 DIAGNOSIS — R6882 Decreased libido: Secondary | ICD-10-CM

## 2019-12-18 MED ORDER — ADDYI 100 MG PO TABS: 1.0000 | ORAL_TABLET | Freq: Every day | ORAL | 11 refills | Status: DC

## 2019-12-18 MED FILL — XOLAIR 150 MG SOLR: 150 | 28 days supply | Qty: 2 | Fill #1

## 2019-12-18 NOTE — Progress Notes (Signed)
Chief Complaint  Patient presents with  . Discuss libido      29 y.o. [id number] Patient's last menstrual period was 11/03/2019. The current method of family planning is none.  Outpatient Encounter Medications as of 12/18/2019  Medication Sig Note  . cetirizine (ZYRTEC) 10 MG tablet Take two tablets twice daily for three days and then one tablet twice daily for two weeks   . fluticasone (FLONASE) 50 MCG/ACT nasal spray Place 2 sprays into both nostrils daily.   . montelukast (SINGULAIR) 10 MG tablet  10/21/2019: Patient has been taking it sporadically when she remembers  . phentermine 15 MG capsule Take 1 capsule (15 mg total) by mouth every morning.   Arvid Right 150 MG injection    . EPINEPHrine 0.3 mg/0.3 mL IJ SOAJ injection SMARTSIG:1 Pre-Filled Pen Syringe IM PRN   . Flibanserin (ADDYI) 100 MG TABS Take 1 tablet by mouth at bedtime.   . [DISCONTINUED] predniSONE (STERAPRED UNI-PAK 21 TAB) 10 MG (21) TBPK tablet Take 2 tablets by mouth daily x 3 days   . [DISCONTINUED] temazepam (RESTORIL) 15 MG capsule Take 1 capsule (15 mg total) by mouth at bedtime as needed for sleep.    Facility-Administered Encounter Medications as of 12/18/2019  Medication  . omalizumab Arvid Right) injection 300 mg    Subjective Pt with 3 years of dimishing sex drive Accelerated over the past year Now none at all Has a complicated relationship Husband had an extra marital relationship which resulted in a child and the child comes over every weekend there are lots of other related issues all centering around the results of this relationshi[ Some LLQ discomfort as well Past Medical History:  Diagnosis Date  . Abnormal Pap smear   . Anemia   . Anxiety   . Arthritis   . Chronic back pain   . Depression   . Dysrhythmia    irregular heartrate  . GERD (gastroesophageal reflux disease)   . Headache(784.0)   . Hx of chlamydia infection   . Ovarian cyst, right   . Perimenopausal   . Uterine fibroid      Past Surgical History:  Procedure Laterality Date  . BIOPSY  11/14/2016   Procedure: BIOPSY;  Surgeon: Danie Binder, MD;  Location: [facility];  Service: Endoscopy;;  duodenal gastric  . COLPOSCOPY    . ESOPHAGOGASTRODUODENOSCOPY (EGD) WITH PROPOFOL N/A 11/14/2016   Dr. Oneida Alar: Mild gastritis, normal esophagus, biopsy showed inflammation of the small bowel, gastric biopsies with no H. pylori.  . lipoma surgery    . MASS EXCISION N/A 02/28/2017   Procedure: EXCISION LIPOMA SCALP 3CM;  Surgeon: Aviva Signs, MD;  Location: [facility];  Service: General;  Laterality: N/A;  . UMBILICAL HERNIA REPAIR N/A 04/04/2019   Procedure: HERNIA REPAIR UMBILICAL ADULT WITH MESH;  Surgeon: Virl Cagey, MD;  Location: [facility];  Service: General;  Laterality: N/A;    OB History    Gravida  2   Para  2   Term  2   Preterm  0   AB  0   Living  2     SAB  0   TAB  0   Ectopic  0   Multiple  0   Live Births  2           Allergies  Allergen Reactions  . Hctz [Hydrochlorothiazide] Itching  . Meat [Alpha-Gal] Hives    Red Meat  . Mixed Grasses Itching  trees  . Pollen Extract Cough  . Latex Rash  . Other Rash    Coconut causes a rash    Social History   Socioeconomic History  . Marital status: Married    Spouse name: Not on file  . Number of children: 2  . Years of education: Not on file  . Highest education level: Not on file  Occupational History  . Occupation: clinical care coordinator    Comment: [employer]  Tobacco Use  . Smoking status: Never Smoker  . Smokeless tobacco: Never Used  Substance and Sexual Activity  . Alcohol use: Yes  . Drug use: Yes    Types: Other-see comments    Comment: CBD drops  . Sexual activity: Yes    Partners: Male    Birth control/protection: None  Other Topics Concern  . Not on file  Social History Narrative  . Not on file   Social Determinants of Health   Financial Resource Strain:   . Difficulty of  Paying Living Expenses:   Food Insecurity:   . Worried About Charity fundraiser in the Last Year:   . Arboriculturist in the Last Year:   Transportation Needs:   . Film/video editor (Medical):   Marland Kitchen Lack of Transportation (Non-Medical):   Physical Activity:   . Days of Exercise per Week:   . Minutes of Exercise per Session:   Stress:   . Feeling of Stress :   Social Connections:   . Frequency of Communication with Friends and Family:   . Frequency of Social Gatherings with Friends and Family:   . Attends Religious Services:   . Active Member of Clubs or Organizations:   . Attends Archivist Meetings:   Marland Kitchen Marital Status:     Family History  Problem Relation Age of Onset  . Diabetes Father   . Hypertension Father   . Other Son        reactive airway disease  . Asthma Son   . Heart murmur Son   . Depression Mother   . Anxiety disorder Mother   . Cancer Maternal Grandmother        lung  . Lung cancer Maternal Grandmother   . Heart disease Paternal Grandmother   . Hypertension Paternal Grandmother   . Liver disease Paternal Grandfather        etoh related  . Alcohol abuse Maternal Grandfather   . Colon cancer Neg Hx     Medications:       Current Outpatient Medications:  .  cetirizine (ZYRTEC) 10 MG tablet, Take two tablets twice daily for three days and then one tablet twice daily for two weeks, Disp: 40 tablet, Rfl: 0 .  fluticasone (FLONASE) 50 MCG/ACT nasal spray, Place 2 sprays into both nostrils daily., Disp: 16 g, Rfl: 6 .  montelukast (SINGULAIR) 10 MG tablet, , Disp: , Rfl:  .  phentermine 15 MG capsule, Take 1 capsule (15 mg total) by mouth every morning., Disp: 30 capsule, Rfl: 1 .  XOLAIR 150 MG injection, , Disp: , Rfl:  .  EPINEPHrine 0.3 mg/0.3 mL IJ SOAJ injection, SMARTSIG:1 Pre-Filled Pen Syringe IM PRN, Disp: , Rfl:  .  Flibanserin (ADDYI) 100 MG TABS, Take 1 tablet by mouth at bedtime., Disp: 30 tablet, Rfl: 11  Current  Facility-Administered Medications:  .  omalizumab Arvid Right) injection 300 mg, 300 mg, Subcutaneous, Q28 days, Valentina Shaggy, MD, 300 mg at 11/25/19 1331  Objective Blood  pressure 120/80, pulse 94, weight 215 lb (97.5 kg), last menstrual period 11/03/2019.  Gen WDWN NAD  Pertinent ROS No burning with urination, frequency or urgency No nausea, vomiting or diarrhea Nor fever chills or other constitutional symptoms   Labs or studies none    Impression Diagnoses this Encounter::   ICD-10-CM   1. Low libido  R68.82     Established relevant diagnosis(es):   Plan/Recommendations: Meds ordered this encounter  Medications  . Flibanserin (ADDYI) 100 MG TABS    Sig: Take 1 tablet by mouth at bedtime.    Dispense:  30 tablet    Refill:  11    Labs or Scans Ordered: No orders of the defined types were placed in this encounter.   Management:: Try a course of addyi nightly Lots of relationship issues that are large contriutors to the diminshed libido   Follow up Return if symptoms worsen or fail to improve.        Face to face time:  20 minutes  Greater than 50% of the visit time was spent in counseling and coordination of care with the patient.  The summary and outline of the counseling and care coordination is summarized in the note above.   All questions were answered.

## 2019-12-19 NOTE — Progress Notes (Deleted)
Office Visit Note  Patient: Emily Morales             Date of Birth: 1991-02-15           MRN: [id number]             PCP: Minette Brine, FNP Referring: Minette Brine, FNP Visit Date: 01/02/2020 Occupation: _0 @  Subjective:  No chief complaint on file.   History of Present Illness: Emily Morales is a 29 y.o. female ***   Activities of Daily Living:  Patient reports morning stiffness for *** {minute/hour:19697}.   Patient {ACTIONS;DENIES/REPORTS:21021675::"Denies"} nocturnal pain.  Difficulty dressing/grooming: {ACTIONS;DENIES/REPORTS:21021675::"Denies"} Difficulty climbing stairs: {ACTIONS;DENIES/REPORTS:21021675::"Denies"} Difficulty getting out of chair: {ACTIONS;DENIES/REPORTS:21021675::"Denies"} Difficulty using hands for taps, buttons, cutlery, and/or writing: {ACTIONS;DENIES/REPORTS:21021675::"Denies"}  No Rheumatology ROS completed.   PMFS History:  Patient Active Problem List   Diagnosis Date Noted  . MDD (major depressive disorder), recurrent episode, mild ([place]) 08/19/2019  . Panic disorder 08/19/2019  . Epigastric hernia 03/17/2019  . Insomnia 08/23/2018  . Obesity (BMI 35.0-39.9 without comorbidity) 08/23/2018  . Atypical chest pain 05/08/2017  . Benign lipomatous neoplasm of skin and subcutaneous tissue of head, face and neck   . Dyspepsia   . Anemia 11/07/2016  . GERD (gastroesophageal reflux disease) 11/07/2016  . Constipation 11/07/2016  . Encounter for IUD insertion liletta 12/10/2015  . Anxiety and depression 11/17/2015  . Umbilical hernia without obstruction and without gangrene 09/08/2015  . Oligouria 08/17/2015  . Mild dysplasia of cervix 03/26/2014    Past Medical History:  Diagnosis Date  . Abnormal Pap smear   . Anemia   . Anxiety   . Arthritis   . Chronic back pain   . Depression   . Dysrhythmia    irregular heartrate  . GERD (gastroesophageal reflux disease)   . Headache(784.0)   . Hx of chlamydia infection   .  Ovarian cyst, right   . Perimenopausal   . Uterine fibroid     Family History  Problem Relation Age of Onset  . Diabetes Father   . Hypertension Father   . Other Son        reactive airway disease  . Asthma Son   . Heart murmur Son   . Depression Mother   . Anxiety disorder Mother   . Cancer Maternal Grandmother        lung  . Lung cancer Maternal Grandmother   . Heart disease Paternal Grandmother   . Hypertension Paternal Grandmother   . Liver disease Paternal Grandfather        etoh related  . Alcohol abuse Maternal Grandfather   . Colon cancer Neg Hx    Past Surgical History:  Procedure Laterality Date  . BIOPSY  11/14/2016   Procedure: BIOPSY;  Surgeon: Danie Binder, MD;  [facility];  Service: Endoscopy;;  duodenal gastric  . COLPOSCOPY    . ESOPHAGOGASTRODUODENOSCOPY (EGD) WITH PROPOFOL N/A 11/14/2016   Dr. Oneida Alar: Mild gastritis, normal esophagus, biopsy showed inflammation of the small bowel, gastric biopsies with no H. pylori.  . lipoma surgery    . MASS EXCISION N/A 02/28/2017   Procedure: EXCISION LIPOMA SCALP 3CM;  Surgeon: Aviva Signs, MD;  Location: [facility];  Service: General;  Laterality: N/A;  . UMBILICAL HERNIA REPAIR N/A 04/04/2019   Procedure: HERNIA REPAIR UMBILICAL ADULT WITH MESH;  Surgeon: Virl Cagey, MD;  Location: [facility];  Service: General;  Laterality: N/A;   Social History   Social History Narrative  .  Not on file   Immunization History  Administered Date(s) Administered  . DTaP 03/26/1989, 12/27/1989, 03/21/1994, 04/27/1994  . Hepatitis B 06/13/2004  . Hepatitis B, adult 04/21/2019  . Hpv 11/15/2006  . IPV 03/26/1989, 12/27/1989, 03/21/1994  . Influenza Split 04/25/2012  . Influenza,inj,Quad PF,6+ Mos 05/16/2018, 04/21/2019  . Influenza-Unspecified 05/16/2018  . MMR 03/21/1994, 04/27/1994  . Moderna SARS-COVID-2 Vaccination 09/27/2019, 10/31/2019  . Td 11/29/1999, 05/24/2010  . Tdap 04/25/2012, 10/20/2015      Objective: Vital Signs: There were no vitals taken for this visit.   Physical Exam   Musculoskeletal Exam: ***  CDAI Exam: CDAI Score: -- Patient Global: --; Provider Global: -- Swollen: --; Tender: -- Joint Exam 01/02/2020   No joint exam has been documented for this visit   There is currently no information documented on the homunculus. Go to the Rheumatology activity and complete the homunculus joint exam.  Investigation: No additional findings.  Imaging: No results found.  Recent Labs: Lab Results  Component Value Date   WBC 9.6 06/05/2019   HGB 11.2 06/05/2019   PLT 309 06/05/2019   NA 139 06/05/2019   K 4.0 06/05/2019   CL 105 06/05/2019   CO2 19 (L) 06/05/2019   GLUCOSE 75 06/05/2019   BUN 14 06/05/2019   CREATININE 0.76 06/05/2019   BILITOT 0.3 06/05/2019   ALKPHOS 80 06/05/2019   AST 10 06/05/2019   ALT 30 06/05/2019   PROT 7.2 06/05/2019   ALBUMIN 4.0 06/05/2019   CALCIUM 9.2 06/05/2019   GFRAA 122 06/05/2019   QFTBGOLDPLUS Negative 04/21/2019    Speciality Comments: No specialty comments available.  Procedures:  No procedures performed Allergies: Hctz [hydrochlorothiazide], Meat [alpha-gal], Mixed grasses, Pollen extract, Latex, and Other   Assessment / Plan:     Visit Diagnoses: No diagnosis found.  Orders: No orders of the defined types were placed in this encounter.  No orders of the defined types were placed in this encounter.   Face-to-face time spent with patient was *** minutes. Greater than 50% of time was spent in counseling and coordination of care.  Follow-Up Instructions: No follow-ups on file.   [name], PA-C  Note - This record has been created using Dragon software.  Chart creation errors have been sought, but may not always  have been located. Such creation errors do not reflect on  the standard of medical care.

## 2019-12-23 ENCOUNTER — Ambulatory Visit: Payer: Self-pay

## 2019-12-23 ENCOUNTER — Encounter: Payer: Self-pay | Admitting: Allergy & Immunology

## 2019-12-24 ENCOUNTER — Other Ambulatory Visit: Payer: Self-pay

## 2019-12-24 ENCOUNTER — Ambulatory Visit (INDEPENDENT_AMBULATORY_CARE_PROVIDER_SITE_OTHER): Payer: 59

## 2019-12-24 DIAGNOSIS — L501 Idiopathic urticaria: Secondary | ICD-10-CM | POA: Diagnosis not present

## 2019-12-24 DIAGNOSIS — L508 Other urticaria: Secondary | ICD-10-CM

## 2019-12-28 ENCOUNTER — Other Ambulatory Visit: Payer: Self-pay

## 2019-12-28 ENCOUNTER — Emergency Department (HOSPITAL_COMMUNITY): Payer: 59

## 2019-12-28 ENCOUNTER — Encounter (HOSPITAL_COMMUNITY): Payer: Self-pay | Admitting: Emergency Medicine

## 2019-12-28 ENCOUNTER — Emergency Department (HOSPITAL_COMMUNITY)
Admission: EM | Admit: 2019-12-28 | Discharge: 2019-12-28 | Disposition: A | Discharge status: Home / Self Care | Payer: 59 | Attending: Emergency Medicine | Admitting: Emergency Medicine

## 2019-12-28 ENCOUNTER — Ambulatory Visit
Admission: EM | Admit: 2019-12-28 | Discharge: 2019-12-28 | Disposition: A | Discharge status: Home / Self Care | Payer: 59 | Source: Home / Self Care

## 2019-12-28 DIAGNOSIS — K219 Gastro-esophageal reflux disease without esophagitis: Secondary | ICD-10-CM | POA: Insufficient documentation

## 2019-12-28 DIAGNOSIS — R1031 Right lower quadrant pain: Secondary | ICD-10-CM | POA: Insufficient documentation

## 2019-12-28 DIAGNOSIS — R102 Pelvic and perineal pain: Secondary | ICD-10-CM | POA: Diagnosis not present

## 2019-12-28 DIAGNOSIS — K581 Irritable bowel syndrome with constipation: Secondary | ICD-10-CM | POA: Diagnosis not present

## 2019-12-28 DIAGNOSIS — K5730 Diverticulosis of large intestine without perforation or abscess without bleeding: Secondary | ICD-10-CM | POA: Diagnosis not present

## 2019-12-28 DIAGNOSIS — D259 Leiomyoma of uterus, unspecified: Secondary | ICD-10-CM | POA: Diagnosis not present

## 2019-12-28 LAB — COMPREHENSIVE METABOLIC PANEL
ALT: 33 U/L (ref 0–44)
AST: 25 U/L (ref 15–41)
Albumin: 4.1 g/dL (ref 3.5–5.0)
Alkaline Phosphatase: 74 U/L (ref 38–126)
Anion gap: 7 (ref 5–15)
BUN: 12 mg/dL (ref 6–20)
CO2: 26 mmol/L (ref 22–32)
Calcium: 9.5 mg/dL (ref 8.9–10.3)
Chloride: 105 mmol/L (ref 98–111)
Creatinine, Ser: 0.75 mg/dL (ref 0.44–1.00)
GFR calc Af Amer: >60 mL/min (ref >60)
GFR calc non Af Amer: >60 mL/min (ref >60)
Glucose, Bld: 101 mg/dL — ABNORMAL HIGH (ref 70–99)
Potassium: 3.7 mmol/L (ref 3.5–5.1)
Sodium: 138 mmol/L (ref 135–145)
Total Bilirubin: 0.6 mg/dL (ref 0.3–1.2)
Total Protein: 8.2 g/dL — ABNORMAL HIGH (ref 6.5–8.1)

## 2019-12-28 LAB — CBC WITH DIFFERENTIAL/PLATELET
Abs Immature Granulocytes: 0.01 10*3/uL (ref 0.00–0.07)
Basophils Absolute: 0 10*3/uL (ref 0.0–0.1)
Basophils Relative: 0 %
Eosinophils Absolute: 0.1 10*3/uL (ref 0.0–0.5)
Eosinophils Relative: 2 %
HCT: 35.1 % — ABNORMAL LOW (ref 36.0–46.0)
Hemoglobin: 11.1 g/dL — ABNORMAL LOW (ref 12.0–15.0)
Immature Granulocytes: 0 %
Lymphocytes Relative: 41 %
Lymphs Abs: 1.9 10*3/uL (ref 0.7–4.0)
MCH: 26.3 pg (ref 26.0–34.0)
MCHC: 31.6 g/dL (ref 30.0–36.0)
MCV: 83.2 fL (ref 80.0–100.0)
Monocytes Absolute: 0.3 10*3/uL (ref 0.1–1.0)
Monocytes Relative: 7 %
Neutro Abs: 2.3 10*3/uL (ref 1.7–7.7)
Neutrophils Relative %: 50 %
Platelets: 301 10*3/uL (ref 150–400)
RBC: 4.22 MIL/uL (ref 3.87–5.11)
RDW: 15.4 % (ref 11.5–15.5)
WBC: 4.6 10*3/uL (ref 4.0–10.5)
nRBC: 0 % (ref 0.0–0.2)

## 2019-12-28 LAB — URINALYSIS, ROUTINE W REFLEX MICROSCOPIC
Bilirubin Urine: NEGATIVE
Glucose, UA: NEGATIVE mg/dL
Hgb urine dipstick: NEGATIVE
Ketones, ur: NEGATIVE mg/dL
Leukocytes,Ua: NEGATIVE
Nitrite: NEGATIVE
Protein, ur: NEGATIVE mg/dL
Specific Gravity, Urine: 1.025 (ref 1.005–1.030)
pH: 5 (ref 5.0–8.0)

## 2019-12-28 LAB — WET PREP, GENITAL
Clue Cells Wet Prep HPF POC: NONE SEEN
Sperm: NONE SEEN
Trich, Wet Prep: NONE SEEN
Yeast Wet Prep HPF POC: NONE SEEN

## 2019-12-28 LAB — PREGNANCY, URINE: Preg Test, Ur: NEGATIVE

## 2019-12-28 LAB — HIV ANTIBODY (ROUTINE TESTING W REFLEX): HIV Screen 4th Generation wRfx: NONREACTIVE

## 2019-12-28 MED ORDER — IOHEXOL 300 MG/ML  SOLN
100.0000 mL | Freq: Once | INTRAMUSCULAR | Status: AC | PRN
  Administered 2019-12-28: 100 mL via INTRAVENOUS

## 2019-12-28 MED ORDER — SODIUM CHLORIDE 0.9 % IV BOLUS
1000.0000 mL | Freq: Once | INTRAVENOUS | Status: AC
  Administered 2019-12-28: 1000 mL via INTRAVENOUS

## 2019-12-28 NOTE — ED Triage Notes (Signed)
RLQ pain that started x 3 hours ago.  Tender to palpation.

## 2019-12-28 NOTE — ED Notes (Signed)
Patient is being discharged from the Urgent Care and sent to the Emergency Department via pov. Per [place], [place], patient is in need of higher level of care due to RLQ. Patient is aware and verbalizes understanding of plan of care. There were no vitals filed for this visit.

## 2019-12-28 NOTE — ED Triage Notes (Signed)
Pt reports RLQ abdominal pain that started today, denies n/v/d/fever, no urinary symptoms

## 2019-12-28 NOTE — Discharge Instructions (Signed)
Your work-up today was overall reassuring. Should pain persist, may return in 24 hours for recheck and reexamination. Otherwise, follow-up with your primary care provider and/your OB/GYN. Return to the ED should symptoms worsen or if you begin to have persistent vomiting, bloody diarrhea, fever with abdominal pain, or any other major concerns.

## 2019-12-28 NOTE — ED Provider Notes (Signed)
[facility] EMERGENCY DEPARTMENT Provider Note   CSN: [id number] Arrival date & time: 12/28/19  1334     History Chief Complaint  Patient presents with  . Abdominal Pain    Emily Morales is a 29 y.o. female.  HPI      Emily Morales is a 29 y.o. female, with a history of ovarian cysts, GERD, anxiety, anemia, presenting to the ED with abdominal pain beginning around 10 AM this morning. Patient states she was getting out of the car when she had onset of pain in the right lower abdomen, sharp, 10/10 with movement and change in position, resolves at rest, nonradiating.  She has not had similar pain in the past. She states she has IBS-C and will sometimes go a few weeks without having a bowel movement.  She does not know when her last bowel movement occurred.  Denies fever/chills, N/V/D, rectal bleeding, vaginal bleeding/discharge, chest pain, shortness of breath, urinary symptoms, flank/back pain, dizziness, syncope, or any other complaints.   Past Medical History:  Diagnosis Date  . Abnormal Pap smear   . Anemia   . Anxiety   . Arthritis   . Chronic back pain   . Depression   . Dysrhythmia    irregular heartrate  . GERD (gastroesophageal reflux disease)   . Headache(784.0)   . Hx of chlamydia infection   . Ovarian cyst, right   . Perimenopausal   . Uterine fibroid     Patient Active Problem List   Diagnosis Date Noted  . MDD (major depressive disorder), recurrent episode, mild ([place]) 08/19/2019  . Panic disorder 08/19/2019  . Epigastric hernia 03/17/2019  . Insomnia 08/23/2018  . Obesity (BMI 35.0-39.9 without comorbidity) 08/23/2018  . Atypical chest pain 05/08/2017  . Benign lipomatous neoplasm of skin and subcutaneous tissue of head, face and neck   . Dyspepsia   . Anemia 11/07/2016  . GERD (gastroesophageal reflux disease) 11/07/2016  . Constipation 11/07/2016  . Encounter for IUD insertion liletta 12/10/2015  . Anxiety and depression 11/17/2015   . Umbilical hernia without obstruction and without gangrene 09/08/2015  . Oligouria 08/17/2015  . Mild dysplasia of cervix 03/26/2014    Past Surgical History:  Procedure Laterality Date  . BIOPSY  11/14/2016   Procedure: BIOPSY;  Surgeon: Danie Binder, MD;  Location: [facility] ENDO SUITE;  Service: Endoscopy;;  duodenal gastric  . COLPOSCOPY    . ESOPHAGOGASTRODUODENOSCOPY (EGD) WITH PROPOFOL N/A 11/14/2016   Dr. Oneida Alar: Mild gastritis, normal esophagus, biopsy showed inflammation of the small bowel, gastric biopsies with no H. pylori.  . lipoma surgery    . MASS EXCISION N/A 02/28/2017   Procedure: EXCISION LIPOMA SCALP 3CM;  Surgeon: Aviva Signs, MD;  Location: [facility] ORS;  Service: General;  Laterality: N/A;  . UMBILICAL HERNIA REPAIR N/A 04/04/2019   Procedure: HERNIA REPAIR UMBILICAL ADULT WITH MESH;  Surgeon: Virl Cagey, MD;  Location: [facility] ORS;  Service: General;  Laterality: N/A;     OB History    Gravida  2   Para  2   Term  2   Preterm  0   AB  0   Living  2     SAB  0   TAB  0   Ectopic  0   Multiple  0   Live Births  2           Family History  Problem Relation Age of Onset  . Diabetes Father   . Hypertension Father   .  Other Son        reactive airway disease  . Asthma Son   . Heart murmur Son   . Depression Mother   . Anxiety disorder Mother   . Cancer Maternal Grandmother        lung  . Lung cancer Maternal Grandmother   . Heart disease Paternal Grandmother   . Hypertension Paternal Grandmother   . Liver disease Paternal Grandfather        etoh related  . Alcohol abuse Maternal Grandfather   . Colon cancer Neg Hx     Social History   Tobacco Use  . Smoking status: Never Smoker  . Smokeless tobacco: Never Used  Substance Use Topics  . Alcohol use: Yes    Comment: occassional   . Drug use: Not Currently    Types: Other-see comments    Comment: CBD drops    Home Medications Prior to Admission medications   Medication Sig  Start Date End Date Taking? Authorizing Provider  cetirizine (ZYRTEC) 10 MG tablet Take two tablets twice daily for three days and then one tablet twice daily for two weeks 12/17/19  Yes Valentina Shaggy, MD  EPINEPHrine 0.3 mg/0.3 mL IJ SOAJ injection SMARTSIG:1 Pre-Filled Pen Syringe IM PRN 11/09/19  Yes [provider]  Flibanserin (ADDYI) 100 MG TABS Take 1 tablet by mouth at bedtime. 12/18/19  Yes Florian Buff, MD  fluticasone (FLONASE) 50 MCG/ACT nasal spray Place 2 sprays into both nostrils daily. 11/23/19  Yes Hawks, Christy A, FNP  montelukast (SINGULAIR) 10 MG tablet  09/11/19  Yes [provider]  phentermine 15 MG capsule Take 1 capsule (15 mg total) by mouth every morning. 11/13/19  Yes Minette Brine, FNP  XOLAIR 150 MG injection  12/18/19   [provider]  temazepam (RESTORIL) 15 MG capsule Take 1 capsule (15 mg total) by mouth at bedtime as needed for sleep. 08/19/19 10/31/19  Nev[place] Crane, MD    Allergies    Hctz [hydrochlorothiazide], Meat [alpha-gal], Mixed grasses, Pollen extract, Latex, and Other  Review of Systems   Review of Systems  Constitutional: Negative for chills, diaphoresis and fever.  Respiratory: Negative for shortness of breath.   Cardiovascular: Negative for chest pain.  Gastrointestinal: Positive for abdominal pain and constipation. Negative for anal bleeding, diarrhea, nausea and vomiting.  Genitourinary: Negative for dysuria, flank pain, hematuria, vaginal bleeding and vaginal discharge.  Musculoskeletal: Negative for back pain.  Neurological: Negative for dizziness, syncope and weakness.  All other systems reviewed and are negative.   Physical Exam Updated Vital Signs BP 124/78 (BP Location: Right Arm)   Pulse 87   Temp 98.3 F (36.8 C) (Oral)   Resp 18   Ht 5\' 5"  (1.651 m)   Wt 93.9 kg   LMP 11/26/2019   SpO2 100%   BMI 34.45 kg/m   Physical Exam Vitals and nursing note reviewed.  Constitutional:      General:  She is not in acute distress.    Appearance: She is well-developed. She is not diaphoretic.  HENT:     Head: Normocephalic and atraumatic.     Mouth/Throat:     Mouth: Mucous membranes are moist.     Pharynx: Oropharynx is clear.  Eyes:     Conjunctiva/sclera: Conjunctivae normal.  Cardiovascular:     Rate and Rhythm: Normal rate and regular rhythm.     Pulses: Normal pulses.          Radial pulses are 2+ on the  right side and 2+ on the left side.       Posterior tibial pulses are 2+ on the right side and 2+ on the left side.     Heart sounds: Normal heart sounds.     Comments: Tactile temperature in the extremities appropriate and equal bilaterally. Pulmonary:     Effort: Pulmonary effort is normal. No respiratory distress.     Breath sounds: Normal breath sounds.  Abdominal:     General: Bowel sounds are normal.     Palpations: Abdomen is soft.     Tenderness: There is abdominal tenderness in the right lower quadrant and suprapubic area. There is no right CVA tenderness, left CVA tenderness or guarding.  Genitourinary:    Comments: External genitalia normal Vagina with discharge - small amount of thick, white vaginal discharge Cervix  normal negative for cervical motion tenderness Adnexa palpated, no masses, negative for tenderness noted bilaterally Bladder palpated negative for tenderness Uterus palpated no masses, positive for tenderness  No inguinal lymphadenopathy. Otherwise normal female genitalia. RN, Theadora Rama, served as Producer, television/film/video during exam. Musculoskeletal:     Cervical back: Neck supple.     Right lower leg: No edema.     Left lower leg: No edema.  Lymphadenopathy:     Cervical: No cervical adenopathy.  Skin:    General: Skin is warm and dry.  Neurological:     Mental Status: She is alert.  Psychiatric:        Mood and Affect: Mood and affect normal.        Speech: Speech normal.        Behavior: Behavior normal.     ED Results / Procedures / Treatments     Labs (all labs ordered are listed, but only abnormal results are displayed) Labs Reviewed  WET PREP, GENITAL - Abnormal; Notable for the following components:      Result Value   WBC, Wet Prep HPF POC FEW (*)    All other components within normal limits  COMPREHENSIVE METABOLIC PANEL - Abnormal; Notable for the following components:   Glucose, Bld 101 (*)    Total Protein 8.2 (*)    All other components within normal limits  CBC WITH DIFFERENTIAL/PLATELET - Abnormal; Notable for the following components:   Hemoglobin 11.1 (*)    HCT 35.1 (*)    All other components within normal limits  URINALYSIS, ROUTINE W REFLEX MICROSCOPIC - Abnormal; Notable for the following components:   APPearance HAZY (*)    All other components within normal limits  PREGNANCY, URINE  RPR  HIV ANTIBODY (ROUTINE TESTING W REFLEX)  GC/CHLAMYDIA PROBE AMP ([place]) NOT AT [facility]   Hemoglobin  Date Value Ref Range Status  12/28/2019 11.1 (L) 12.0 - 15.0 g/dL Final  06/05/2019 11.2 11.1 - 15.9 g/dL Final  04/21/2019 10.3 (L) 11.1 - 15.9 g/dL Final  04/01/2019 10.7 (L) 12.0 - 15.0 g/dL Final  06/18/2018 11.0 (L) 12.0 - 15.0 g/dL Final  02/01/2018 10.1 (L) 12.0 - 15.0 g/dL Final  08/09/2015 9.6 (L) 11.1 - 15.9 g/dL Final  04/19/2015 10.8 (L) 11.1 - 15.9 g/dL Final    EKG None  Radiology US Transvaginal Non-OB  Result Date: 12/28/2019 CLINICAL DATA:  Pain for months.  Worsening right-sided pain today. EXAM: TRANSABDOMINAL AND TRANSVAGINAL ULTRASOUND OF PELVIS DOPPLER ULTRASOUND OF OVARIES TECHNIQUE: Both transabdominal and transvaginal ultrasound examinations of the pelvis were performed. Transabdominal technique was performed for global imaging of the pelvis including uterus, ovaries, adnexal regions, and  pelvic cul-de-sac. It was necessary to proceed with endovaginal exam following the transabdominal exam to visualize the endometrium and ovaries. Color and duplex Doppler ultrasound was utilized to  evaluate blood flow to the ovaries. COMPARISON:  CT scan from earlier today FINDINGS: Uterus Measurements: 10.1 x 5.4 x 6.3 cm = volume: 176.9 mL. Contains an 8 mm fibroid at the anterior fundus. Endometrium Thickness: 3.5 mm.  No focal abnormality visualized. Right ovary Measurements: 3.4 x 1.4 x 2.4 cm = volume: 6 mL. Normal appearance/no adnexal mass. Left ovary Measurements: 2.8 x 1.6 x 2.0 cm = volume: 4.5 mL. Normal appearance/no adnexal mass. Pulsed Doppler evaluation of both ovaries demonstrates normal low-resistance arterial and venous waveforms. Other findings No abnormal free fluid. IMPRESSION: 1. Small fibroid in the uterus. 2. The ovaries are normal in appearance with multiple normal appearing follicles on the left. No torsion. Electronically Signed   By: Dorise Bullion III M.D   On: 12/28/2019 16:44   US Pelvis Complete  Result Date: 12/28/2019 CLINICAL DATA:  Pain for months.  Worsening right-sided pain today. EXAM: TRANSABDOMINAL AND TRANSVAGINAL ULTRASOUND OF PELVIS DOPPLER ULTRASOUND OF OVARIES TECHNIQUE: Both transabdominal and transvaginal ultrasound examinations of the pelvis were performed. Transabdominal technique was performed for global imaging of the pelvis including uterus, ovaries, adnexal regions, and pelvic cul-de-sac. It was necessary to proceed with endovaginal exam following the transabdominal exam to visualize the endometrium and ovaries. Color and duplex Doppler ultrasound was utilized to evaluate blood flow to the ovaries. COMPARISON:  CT scan from earlier today FINDINGS: Uterus Measurements: 10.1 x 5.4 x 6.3 cm = volume: 176.9 mL. Contains an 8 mm fibroid at the anterior fundus. Endometrium Thickness: 3.5 mm.  No focal abnormality visualized. Right ovary Measurements: 3.4 x 1.4 x 2.4 cm = volume: 6 mL. Normal appearance/no adnexal mass. Left ovary Measurements: 2.8 x 1.6 x 2.0 cm = volume: 4.5 mL. Normal appearance/no adnexal mass. Pulsed Doppler evaluation of both ovaries  demonstrates normal low-resistance arterial and venous waveforms. Other findings No abnormal free fluid. IMPRESSION: 1. Small fibroid in the uterus. 2. The ovaries are normal in appearance with multiple normal appearing follicles on the left. No torsion. Electronically Signed   By: Dorise Bullion III M.D   On: 12/28/2019 16:44   CT ABDOMEN PELVIS W CONTRAST  Result Date: 12/28/2019 CLINICAL DATA:  Right lower quadrant pain. Appendicitis suspected. Patient reports intermittent right lower quadrant pain for months, worse today. EXAM: CT ABDOMEN AND PELVIS WITH CONTRAST TECHNIQUE: Multidetector CT imaging of the abdomen and pelvis was performed using the standard protocol following bolus administration of intravenous contrast. CONTRAST:  126mL OMNIPAQUE IOHEXOL 300 MG/ML  SOLN COMPARISON:  CT 02/14/2019 FINDINGS: Lower chest: Minor dependent atelectasis.  No pleural fluid. Hepatobiliary: No focal hepatic lesion. Gallbladder minimally distended. No calcified gallstone or pericholecystic inflammation. Pancreas: Mild motion artifact but no evidence of inflammatory change. No ductal dilatation. Spleen: Normal in size without focal abnormality. Adrenals/Urinary Tract: Normal adrenal glands. No hydronephrosis or perinephric edema. Homogeneous bilateral renal enhancement. Urinary bladder is near completely empty. Stomach/Bowel: Normal appendix, series 5, image 46. The distal esophagus is patulous but no wall thickening. Ingested material in the stomach. No gastric wall thickening. No small bowel obstruction or inflammatory change. No terminal ileal inflammation. Small volume of stool throughout the colon. There is mild transverse colonic tortuosity. Minimal diverticulosis in the sigmoid colon without diverticulitis. Vascular/Lymphatic: Normal caliber abdominal aorta. The portal vein is patent. No acute vascular findings. No enlarged lymph nodes  in the abdomen or pelvis. Reproductive: Uterus is anteverted and slightly  heterogeneous but no evidence of dominant fibroid. Ovaries are symmetric in size without cyst or focal abnormality. No adnexal mass. Other: Post interval umbilical and ventral abdominal wall hernia repair without recurrent hernia. No inguinal hernia. No free air, free fluid, or intra-abdominal fluid collection. Musculoskeletal: There are no acute or suspicious osseous abnormalities. IMPRESSION: 1. No acute abnormality or explanation for abdominal pain. Normal appendix. 2. Post interval umbilical and ventral abdominal wall hernia repair without recurrent hernia. Electronically Signed   By: Keith Rake M.D.   On: 12/28/2019 15:24   [place] Art/Ven Flow Abd Pelv Doppler  Result Date: 12/28/2019 CLINICAL DATA:  Pain for months.  Worsening right-sided pain today. EXAM: TRANSABDOMINAL AND TRANSVAGINAL ULTRASOUND OF PELVIS DOPPLER ULTRASOUND OF OVARIES TECHNIQUE: Both transabdominal and transvaginal ultrasound examinations of the pelvis were performed. Transabdominal technique was performed for global imaging of the pelvis including uterus, ovaries, adnexal regions, and pelvic cul-de-sac. It was necessary to proceed with endovaginal exam following the transabdominal exam to visualize the endometrium and ovaries. Color and duplex Doppler ultrasound was utilized to evaluate blood flow to the ovaries. COMPARISON:  CT scan from earlier today FINDINGS: Uterus Measurements: 10.1 x 5.4 x 6.3 cm = volume: 176.9 mL. Contains an 8 mm fibroid at the anterior fundus. Endometrium Thickness: 3.5 mm.  No focal abnormality visualized. Right ovary Measurements: 3.4 x 1.4 x 2.4 cm = volume: 6 mL. Normal appearance/no adnexal mass. Left ovary Measurements: 2.8 x 1.6 x 2.0 cm = volume: 4.5 mL. Normal appearance/no adnexal mass. Pulsed Doppler evaluation of both ovaries demonstrates normal low-resistance arterial and venous waveforms. Other findings No abnormal free fluid. IMPRESSION: 1. Small fibroid in the uterus. 2. The ovaries are  normal in appearance with multiple normal appearing follicles on the left. No torsion. Electronically Signed   By: Dorise Bullion III M.D   On: 12/28/2019 16:44    Procedures Pelvic exam  Date/Time: 12/28/2019 3:35 PM Performed by: Lorayne Bender, PA-C Authorized by: Lorayne Bender, PA-C  Consent: Verbal consent obtained. Risks and benefits: risks, benefits and alternatives were discussed Consent given by: patient Patient understanding: patient states understanding of the procedure being performed Patient consent: the patient's understanding of the procedure matches consent given Patient identity confirmed: verbally with patient and provided demographic data Local anesthesia used: no  Anesthesia: Local anesthesia used: no  Sedation: Patient sedated: no  Patient tolerance: patient tolerated the procedure well with no immediate complications    (including critical care time)  Medications Ordered in ED Medications  sodium chloride 0.9 % bolus 1,000 mL (0 mLs Intravenous Stopped 12/28/19 1541)  iohexol (OMNIPAQUE) 300 MG/ML solution 100 mL (100 mLs Intravenous Contrast Given 12/28/19 1504)    ED Course  I have reviewed the triage vital signs and the nursing notes.  Pertinent labs & imaging results that were available during my care of the patient were reviewed by me and considered in my medical decision making (see chart for details).  Clinical Course as of Dec 28 1803  Sun Dec 28, 2019  1417 Declines analgesia at this time.   [SJ]    Clinical Course User Index [SJ] Edsel Shives, Helane Gunther, PA-C   MDM Rules/Calculators/A&P                      Patient presents with right lower abdominal pain, seems to only be present with movement, palpation, or changing positions. Patient is  nontoxic appearing, afebrile, not tachycardic, not tachypneic, not hypotensive, maintains excellent SPO2 on room air, and is in no apparent distress while at rest.   I have reviewed the patient's chart to obtain  more information.   I reviewed and interpreted the patient's labs and radiological studies. No leukocytosis. No acute abnormalities on patient's CT or ultrasound. By the end of the ED visit, patient stated she was ready to go home. She was advised to follow-up with her PCP versus OB/GYN. The patient was given instructions for home care as well as return precautions. Patient voices understanding of these instructions, accepts the plan, and is comfortable with discharge.   Vitals:   12/28/19 1346 12/28/19 1347 12/28/19 1749  BP:  124/78 128/83  Pulse:  87 94  Resp:  18 14  Temp:  98.3 F (36.8 C)   TempSrc:  Oral   SpO2:  100% 100%  Weight: 93.9 kg    Height: 5\' 5"  (1.651 m)       Final Clinical Impression(s) / ED Diagnoses Final diagnoses:  Right lower quadrant abdominal pain    Rx / DC Orders ED Discharge Orders    None       Layla Maw 12/28/19 1807    Noemi Chapel, MD 12/29/19 [phone]

## 2019-12-29 LAB — RPR: RPR Ser Ql: NONREACTIVE

## 2019-12-30 LAB — GC/CHLAMYDIA PROBE AMP (~~LOC~~) NOT AT ARMC
Chlamydia: NEGATIVE
Comment: NEGATIVE
Comment: NORMAL
Neisseria Gonorrhea: NEGATIVE

## 2020-01-02 ENCOUNTER — Ambulatory Visit: Payer: No Typology Code available for payment source | Admitting: Rheumatology

## 2020-01-05 ENCOUNTER — Other Ambulatory Visit: Payer: Self-pay

## 2020-01-05 ENCOUNTER — Telehealth: Payer: Self-pay

## 2020-01-05 ENCOUNTER — Encounter: Payer: Self-pay | Admitting: Nurse Practitioner

## 2020-01-05 ENCOUNTER — Telehealth (INDEPENDENT_AMBULATORY_CARE_PROVIDER_SITE_OTHER): Payer: 59 | Admitting: Nurse Practitioner

## 2020-01-05 VITALS — Temp 97.2°F | Ht 65.0 in | Wt 213.0 lb

## 2020-01-05 DIAGNOSIS — E6609 Other obesity due to excess calories: Secondary | ICD-10-CM

## 2020-01-05 DIAGNOSIS — Z6835 Body mass index (BMI) 35.0-35.9, adult: Secondary | ICD-10-CM

## 2020-01-05 MED ORDER — PHENTERMINE HCL 37.5 MG PO CAPS: 37.5000 mg | ORAL_CAPSULE | ORAL | 1 refills | Status: DC

## 2020-01-05 NOTE — Progress Notes (Signed)
This visit occurred during the SARS-CoV-2 public health emergency.  Safety protocols were in place, including screening questions prior to the visit, additional usage of staff PPE, and extensive cleaning of exam room while observing appropriate contact time as indicated for disinfecting solutions.  Subjective:     Patient ID: Emily Morales , female    DOB: May 24, 1991 , 29 y.o.   MRN: [id number]   Chief Complaint  Patient presents with  . Weight Check    HPI  Virtual visit for weight check   She has not tried phentermine.  She does report a history of stress and anxiety a few years ago which caused irregular. Has not tried any other medications for weight loss.  She is not eating as much carbs/pastas, no sweets. She is exercising 10 minutes a day daily.   She has recently signed up for a gym membership. She is drinking  64 oz bottles of water a day. She is also drinking vitamin water.   Wt Readings from Last 3 Encounters: 01/05/20 : 213 lb (96.6 kg) 12/28/19 : 207 lb (93.9 kg) 12/28/19 : 207 lb (93.9 kg)  Her weight in April was 220 lbs  She is taking phentermine once a day 15 mg daily        Past Medical History:  Diagnosis Date  . Abnormal Pap smear   . Anemia   . Anxiety   . Arthritis   . Chronic back pain   . Depression   . Dysrhythmia    irregular heartrate  . GERD (gastroesophageal reflux disease)   . Headache(784.0)   . Hx of chlamydia infection   . Ovarian cyst, right   . Perimenopausal   . Uterine fibroid      Family History  Problem Relation Age of Onset  . Diabetes Father   . Hypertension Father   . Other Son        reactive airway disease  . Asthma Son   . Heart murmur Son   . Depression Mother   . Anxiety disorder Mother   . Cancer Maternal Grandmother        lung  . Lung cancer Maternal Grandmother   . Heart disease Paternal Grandmother   . Hypertension Paternal Grandmother   . Liver disease Paternal Grandfather        etoh related  .  Alcohol abuse Maternal Grandfather   . Colon cancer Neg Hx      Current Outpatient Medications:  .  cetirizine (ZYRTEC) 10 MG tablet, Take two tablets twice daily for three days and then one tablet twice daily for two weeks, Disp: 40 tablet, Rfl: 0 .  EPINEPHrine 0.3 mg/0.3 mL IJ SOAJ injection, SMARTSIG:1 Pre-Filled Pen Syringe IM PRN, Disp: , Rfl:  .  Flibanserin (ADDYI) 100 MG TABS, Take 1 tablet by mouth at bedtime., Disp: 30 tablet, Rfl: 11 .  fluticasone (FLONASE) 50 MCG/ACT nasal spray, Place 2 sprays into both nostrils daily., Disp: 16 g, Rfl: 6 .  montelukast (SINGULAIR) 10 MG tablet, , Disp: , Rfl:  .  phentermine 15 MG capsule, Take 1 capsule (15 mg total) by mouth every morning., Disp: 30 capsule, Rfl: 1 .  XOLAIR 150 MG injection, , Disp: , Rfl:   Current Facility-Administered Medications:  .  omalizumab Arvid Right) injection 300 mg, 300 mg, Subcutaneous, Q28 days, Valentina Shaggy, MD, 300 mg at 12/24/19 1404   Allergies  Allergen Reactions  . Hctz [Hydrochlorothiazide] Itching  . Meat [Alpha-Gal] Hives  Red Meat  . Mixed Grasses Itching    trees  . Pollen Extract Cough  . Latex Rash  . Other Rash    Coconut causes a rash     Review of Systems  Constitutional: Negative.   Respiratory: Negative.   Cardiovascular: Negative.  Negative for chest pain, palpitations and leg swelling.  Neurological: Negative for dizziness and headaches.  Psychiatric/Behavioral: Negative.      Today's Vitals   01/05/20 1659  Temp: (!) 97.2 F (36.2 C)  Weight: 213 lb (96.6 kg)  Height: 5\' 5"  (1.651 m)  PainSc: 0-No pain   Body mass index is 35.45 kg/m.   Objective:  Physical Exam Constitutional:      General: She is not in acute distress.    Appearance: Normal appearance. She is obese.  Cardiovascular:     Rate and Rhythm: Normal rate and regular rhythm.     Pulses: Normal pulses.     Heart sounds: Normal heart sounds. No murmur.  Pulmonary:     Effort: Pulmonary  effort is normal. No respiratory distress.     Breath sounds: Normal breath sounds.  Neurological:     General: No focal deficit present.     Mental Status: She is alert and oriented to person, place, and time.  Psychiatric:        Mood and Affect: Mood normal.        Behavior: Behavior normal.        Thought Content: Thought content normal.        Judgment: Judgment normal.         Assessment And Plan:     1. Class 2 obesity due to excess calories without serious comorbidity with body mass index (BMI) of 35.0 to 35.9 in adult Chronic, congratulated on her 7 lb weight loss.   ASK- this is a follow up appt for her weight loss management ASSESS - Body mass index is 35.45 kg/m. WAIST CIRCUMFERENCE not done today was 43 inches at last visit ADVISE  - on risk of cardiovascular disease currently has a diagnosis of hypertension, on medications. AGREE - to increase her physical activity to 5 days a week by next office visit. She would like to be down to 205 lbs.  ASSIST - she feels her barriers to losing weight is time. She is to follow up in the office with her next visit  - phentermine 37.5 MG capsule; Take 1 capsule (37.5 mg total) by mouth every morning.  Dispense: 30 capsule; Refill: 1    Minette Brine, FNP    THE PATIENT IS ENCOURAGED TO PRACTICE SOCIAL DISTANCING DUE TO THE COVID-19 PANDEMIC.

## 2020-01-05 NOTE — Telephone Encounter (Signed)
Patient consented to virtual appointment. YL,RMA 

## 2020-01-21 ENCOUNTER — Ambulatory Visit: Payer: Self-pay

## 2020-01-23 ENCOUNTER — Ambulatory Visit: Payer: No Typology Code available for payment source | Admitting: Rheumatology

## 2020-02-25 ENCOUNTER — Ambulatory Visit: Payer: No Typology Code available for payment source | Admitting: Family Medicine

## 2020-03-03 ENCOUNTER — Other Ambulatory Visit: Payer: Self-pay | Admitting: Obstetrics & Gynecology

## 2020-03-03 MED ORDER — NORELGESTROMIN-ETH ESTRADIOL 150-35 MCG/24HR TD PTWK: 1.0000 | MEDICATED_PATCH | TRANSDERMAL | 12 refills | Status: DC

## 2020-03-19 ENCOUNTER — Ambulatory Visit: Payer: 59 | Admitting: Allergy & Immunology

## 2020-03-20 ENCOUNTER — Telehealth: Payer: 59 | Admitting: Physician Assistant

## 2020-03-20 DIAGNOSIS — N898 Other specified noninflammatory disorders of vagina: Secondary | ICD-10-CM

## 2020-03-20 NOTE — Progress Notes (Signed)
Hi Magie,   I am sorry you are not feeling well.  Based on what you shared with me regarding the color of your discharge and the accompanying belly pain, I feel your condition warrants further evaluation and I recommend that you be seen for a face to face office visit.  You will likely need labs done. Please see below for our Urgent Care locations.    NOTE: If you entered your credit card information for this eVisit, you will not be charged. You may see a "hold" on your card for the $35 but that hold will drop off and you will not have a charge processed.   If you are having a true medical emergency please call 911.      For an urgent face to face visit, [facility] has five urgent care centers for your convenience:      NEW:  [facility] [place] at [place] Get Driving Directions [phone] [address] [place], [place] [address] . 10 am - 6pm Monday - Friday    [facility] [place] [facility]) Get Driving Directions [phone] [address] [place], [place] [address] . 10 am to 8 pm Monday-Friday . 12 pm to 8 pm [facility] at [facility] Get Driving Directions [phone] [place], [place], [place] [address] . 8 am to 8 pm Monday-Friday . 9 am to 6 pm Saturday . 11 am to 6 pm Sunday     [facility] at [facility] Get Driving Directions  [phone] [address] [place], [place] [address] . 8 am to 8 pm Monday-Friday . 8 am to 4 pm [facility] at  Get Driving Directions [phone] [address] [place], [place] [address] . 12 pm to 6 pm Monday-Friday      Your e-visit answers were reviewed by a board certified advanced clinical practitioner to complete your personal care plan.  Thank you for using e-Visits.

## 2020-03-26 ENCOUNTER — Telehealth: Payer: 59 | Admitting: Nurse Practitioner

## 2020-03-26 ENCOUNTER — Encounter: Payer: Self-pay | Admitting: Nurse Practitioner

## 2020-03-26 DIAGNOSIS — J309 Allergic rhinitis, unspecified: Secondary | ICD-10-CM

## 2020-03-26 MED ORDER — FLUTICASONE PROPIONATE 50 MCG/ACT NA SUSP: 2.0000 | Freq: Every day | NASAL | 0 refills | Status: DC

## 2020-03-26 MED ORDER — MONTELUKAST SODIUM 10 MG PO TABS: 10.0000 mg | ORAL_TABLET | Freq: Every day | ORAL | 0 refills | Status: DC

## 2020-03-26 NOTE — Progress Notes (Signed)
E visit for Allergic Rhinitis We are sorry that you are not feeling well.  Here is how we plan to help!  Based on what you have shared with me it looks like you have Allergic Rhinitis.  Rhinitis is when a reaction occurs that causes nasal congestion, runny nose, sneezing, and itching.  Most types of rhinitis are caused by an inflammation and are associated with symptoms in the eyes ears or throat. There are several types of rhinitis.  The most common are acute rhinitis, which is usually caused by a viral illness, allergic or seasonal rhinitis, and nonallergic or year-round rhinitis.  Nasal allergies occur certain times of the year.  Allergic rhinitis is caused when allergens in the air trigger the release of histamine in the body.  Histamine causes itching, swelling, and fluid to build up in the fragile linings of the nasal passages, sinuses and eyelids.  An itchy nose and clear discharge are common.  I recommend the following over the counter treatments: Montelukast 10 mg at bedtime.    I also would recommend a nasal spray: Flonase 2 sprays into each nostril once daily  You may also benefit from eye drops such as: over-the-counter Visine eye drops.  HOME CARE:   You can use an over-the-counter saline nasal spray as needed  Avoid areas where there is heavy dust, mites, or molds  Stay indoors on windy days during the pollen season  Keep windows closed in home, at least in bedroom; use air conditioner.  Use high-efficiency house air filter  Keep windows closed in car, turn AC on re-circulate  Avoid playing out with dog during pollen season  GET HELP RIGHT AWAY IF:   If your symptoms do not improve within 10 days  You become short of breath  You develop yellow or green discharge from your nose for over 3 days  You have coughing fits  MAKE SURE YOU:   Understand these instructions  Will watch your condition  Will get help right away if you are not doing well or get  worse  Thank you for choosing an e-visit. Your e-visit answers were reviewed by a board certified advanced clinical practitioner to complete your personal care plan. Depending upon the condition, your plan could have included both over the counter or prescription medications. Please review your pharmacy choice. Be sure that the pharmacy you have chosen is open so that you can pick up your prescription now.  If there is a problem you may message your provider in [place] to have the prescription routed to another pharmacy. Your safety is important to [place]. If you have drug allergies check your prescription carefully.  For the next 24 hours, you can use MyChart to ask questions about today's visit, request a non-urgent call back, or ask for a work or school excuse from your e-visit provider. You will get an email in the next two days asking about your experience. I hope that your e-visit has been valuable and will speed your recovery.  I have spent at least 5 minutes reviewing and documenting in the patient's chart.

## 2020-03-29 ENCOUNTER — Other Ambulatory Visit: Payer: Self-pay | Admitting: *Deleted

## 2020-03-29 ENCOUNTER — Other Ambulatory Visit: Payer: Self-pay | Admitting: Adult Health

## 2020-03-29 MED ORDER — MEGESTROL ACETATE 40 MG PO TABS
ORAL_TABLET | ORAL | 1 refills | Status: DC
Start: 2020-03-29 — End: 2020-06-01

## 2020-03-29 NOTE — Telephone Encounter (Signed)
Patient sent a my-chart message this morning.   She was calling to see why she has not heard back, I told her that the MD was not here and was going to route her message to his nurse.

## 2020-03-29 NOTE — Progress Notes (Signed)
rx megace

## 2020-04-05 ENCOUNTER — Other Ambulatory Visit: Payer: Self-pay | Admitting: Adult Health

## 2020-04-20 ENCOUNTER — Encounter: Payer: 59 | Admitting: Nurse Practitioner

## 2020-04-23 ENCOUNTER — Encounter: Payer: No Typology Code available for payment source | Admitting: Family Medicine

## 2020-05-26 ENCOUNTER — Telehealth: Payer: No Typology Code available for payment source | Admitting: Nurse Practitioner

## 2020-05-26 DIAGNOSIS — J329 Chronic sinusitis, unspecified: Secondary | ICD-10-CM | POA: Diagnosis not present

## 2020-05-26 DIAGNOSIS — B9789 Other viral agents as the cause of diseases classified elsewhere: Secondary | ICD-10-CM | POA: Diagnosis not present

## 2020-05-26 NOTE — Progress Notes (Signed)

## 2020-05-28 ENCOUNTER — Encounter: Payer: Self-pay | Admitting: Family

## 2020-05-28 ENCOUNTER — Other Ambulatory Visit: Payer: Self-pay

## 2020-05-28 ENCOUNTER — Encounter: Payer: Self-pay | Admitting: Allergy & Immunology

## 2020-05-28 ENCOUNTER — Ambulatory Visit (INDEPENDENT_AMBULATORY_CARE_PROVIDER_SITE_OTHER): Payer: No Typology Code available for payment source | Admitting: Family

## 2020-05-28 DIAGNOSIS — J069 Acute upper respiratory infection, unspecified: Secondary | ICD-10-CM

## 2020-05-28 MED ORDER — ALBUTEROL SULFATE HFA 108 (90 BASE) MCG/ACT IN AERS: INHALATION_SPRAY | RESPIRATORY_TRACT | 1 refills | Status: DC

## 2020-05-28 MED ORDER — PREDNISONE 10 MG PO TABS: ORAL_TABLET | ORAL | 0 refills | Status: DC

## 2020-05-28 NOTE — Patient Instructions (Addendum)
Upper respiratory tract illness Instructed that we would hold off an antibiotic for now since her symptoms started this past Sunday. Start prednisone 10 mg taking 2 tablets twice a day for 3 days, then on the fourth day take 2 tablets in the morning, and on the fifth day take 1 tablet and stop. May use albuterol 2 puffs every 4 hours as needed for cough, wheeze, tightness in chest, or shortness of breath. Continue Singulair 10 mg once a day Continue Flonase using 2 sprays each nostril once a day as needed for stuffy nose May use saline nasal rinse or saline nasal spray for nasal symptoms.  Use this prior to any medicated nasal sprays. Continue to wait for pending COVID-19 PCR test results Recommend getting tested for flu If symptoms worsen or persist over the weekend I recommend going on to the emergency room/urgent care  Please let us know if this treatment plan is not working well for you.   Schedule a follow-up appointment in 2 months

## 2020-05-28 NOTE — Progress Notes (Addendum)
RE: Emily Morales MRN: [id number] DOB: 10-12-1990 Date of Telemedicine Visit: 05/28/2020  Referring provider: Minette Morales, [place] Primary care provider: Minette Brine, FNP  Chief Complaint: Nasal Congestion, Cough, Headache, and Sore Throat   Telemedicine Follow Up Visit via Telephone: I connected with Emily Morales for a follow up on 05/28/20 by telephone and verified that I am speaking with the correct person using two identifiers.   I discussed the limitations, risks, security and privacy concerns of performing an evaluation and management service by telephone and the availability of in person appointments. I also discussed with the patient that there may be a patient responsible charge related to this service. The patient expressed understanding and agreed to proceed.  Patient is at home.  Provider is at the office.  Visit start time: 10:24 AM Visit end time: 10:39 AM Insurance consent/check in by: front desk Medical consent and medical assistant/nurse: Emily Morales  History of Present Illness: She is a 29 y.o. female, who is being followed for chronic urticaria in the setting of an elevated ESR in December 2020,seasonal and perennial allergic rhinitis, and allergic reaction-unknown trigger. Her previous allergy office visit was on Dec 17, 2019 with Emily Morales. Today is a acute visit.  She reports since this past Sunday she has had a sore throat and then on Monday through Wednesday she lost her voice and has had a dry cough that causes pain in her sternum area.  Last night she began having nasal congestion and her husband heard her wheezing in her sleep last night.  She also has yellow-green postnasal drip and clear rhinorrhea after leaning over steam.  She denies any sinus tenderness and reports that she is not prone to sinus infections.  Her highest temperature was 99 on Wednesday.  She denies any fevers, chills, or body aches.  On Monday she did a COVID-19 rapid test that  was negative. She repeated the rapid test on Wednesday and it was negative.  This morning she went and had the COVID-19 PCR test and the results are pending.  They did not check her for flu.  She reports that she is able to smell, but that everything tastes salty.  Her son this past Wednesday just recently got over RSV.  She has tried DayQuil, NyQuil, Mucinex DM, Sudafed, Singulair, and Flonase with no relief.  She also reports shortness of breath and feels that this is due to nasal congestion.  She denies any recent surgeries, long distance travel, and history of blood clots.  Assessment and Plan: Emily Morales is a 29 y.o. female with: Upper respiratory tract illness Instructed that we would hold off an antibiotic for now since her symptoms started this past Sunday. Start prednisone 10 mg taking 2 tablets twice a day for 3 days, then on the fourth day take 2 tablets in the morning, and on the fifth day take 1 tablet and stop. May use albuterol 2 puffs every 4 hours as needed for cough, wheeze, tightness in chest, or shortness of breath. Continue Singulair 10 mg once a day Continue Flonase using 2 sprays each nostril once a day as needed for stuffy nose May use saline nasal rinse or saline nasal spray for nasal symptoms.  Use this prior to any medicated nasal sprays. Continue to wait for pending COVID-19 PCR test results Recommend getting tested for flu If symptoms worsen or persist over the weekend I recommend going on to the emergency room/urgent care.  Please let us know if this treatment  plan is not working well for you.   Schedule a follow-up appointment in 2 months   Return in about 2 months (around 07/28/2020), or if symptoms worsen or fail to improve.  Meds ordered this encounter  Medications  . albuterol (VENTOLIN HFA) 108 (90 Base) MCG/ACT inhaler    Sig: 2 puffs every 4-6 hours for chest tightness, coughing, shortness of breath    Dispense:  18 g    Refill:  1  . predniSONE (DELTASONE)  10 MG tablet    Sig: Take 2 tablets twice a day for 3 days, then 2 tablets on day 4 then 1 tablet on day 5 then stop    Dispense:  15 tablet    Refill:  0   Lab Orders  No laboratory test(s) ordered today    Diagnostics: None.  Medication List:  Current Outpatient Medications  Medication Sig Dispense Refill  . EPINEPHrine 0.3 mg/0.3 mL IJ SOAJ injection SMARTSIG:1 Pre-Filled Pen Syringe IM PRN    . fluticasone (FLONASE) 50 MCG/ACT nasal spray Place 2 sprays into both nostrils daily. 16 g 6  . megestrol (MEGACE) 40 MG tablet Take 3 x 5 days then 2 x 5 days then 1 daily 45 tablet 1  . montelukast (SINGULAIR) 10 MG tablet     . norelgestromin-ethinyl estradiol (ORTHO EVRA) 150-35 MCG/24HR transdermal patch Place 1 patch onto the skin once a week. 3 patch 12  . albuterol (VENTOLIN HFA) 108 (90 Base) MCG/ACT inhaler 2 puffs every 4-6 hours for chest tightness, coughing, shortness of breath 18 g 1  . cetirizine (ZYRTEC) 10 MG tablet Take two tablets twice daily for three days and then one tablet twice daily for two weeks 40 tablet 0  . Flibanserin (ADDYI) 100 MG TABS Take 1 tablet by mouth at bedtime. 30 tablet 11  . fluticasone (FLONASE) 50 MCG/ACT nasal spray Place 2 sprays into both nostrils daily for 10 days. 16 g 0  . montelukast (SINGULAIR) 10 MG tablet Take 1 tablet (10 mg total) by mouth at bedtime. 30 tablet 0  . phentermine 37.5 MG capsule Take 1 capsule (37.5 mg total) by mouth every morning. 30 capsule 1  . predniSONE (DELTASONE) 10 MG tablet Take 2 tablets twice a day for 3 days, then 2 tablets on day 4 then 1 tablet on day 5 then stop 15 tablet 0  . XOLAIR 150 MG injection      Current Facility-Administered Medications  Medication Dose Route Frequency Provider Last Rate Last Admin  . omalizumab Emily Morales) injection 300 mg  300 mg Subcutaneous Q28 days Emily Shaggy, MD   300 mg at 12/24/19 1404   Allergies: Allergies  Allergen Reactions  . Hctz [Hydrochlorothiazide]  Itching  . Meat [Alpha-Gal] Hives    Red Meat  . Mixed Grasses Itching    trees  . Pollen Extract Cough  . Latex Rash  . Other Rash    Coconut causes a rash   I reviewed her past medical history, social history, family history, and environmental history and no significant changes have been reported from her previous visit.  Review of Systems  Constitutional: Negative for appetite change, chills and fatigue.  HENT: Positive for congestion, postnasal drip, rhinorrhea and sore throat. Negative for sinus pressure and sinus pain.   Eyes:       Reports watery eyes. Denies itchy  Respiratory: Positive for cough, shortness of breath and wheezing.   Cardiovascular: Negative for chest pain and palpitations.  Genitourinary: Negative for  dysuria.  Musculoskeletal: Negative for arthralgias.  Neurological: Positive for headaches.   Objective: Physical Exam Not obtained as encounter was done via telephone.   Previous notes and tests were reviewed.  I discussed the assessment and treatment plan with the patient. The patient was provided an opportunity to ask questions and all were answered. The patient agreed with the plan and demonstrated an understanding of the instructions. After visit summary/patient instructions available via mail.   The patient was advised to call back or seek an in-person evaluation if the symptoms worsen or if the condition fails to improve as anticipated.  I provided 15 minutes of non-face-to-face time during this encounter.  It was my pleasure to participate in [place] care today. Please feel free to contact me with any questions or concerns.   Sincerely,  Althea Charon, FNP  [facility] office: [phone] [facility] office: [place] office: [phone]

## 2020-06-01 ENCOUNTER — Other Ambulatory Visit: Payer: Self-pay

## 2020-06-01 ENCOUNTER — Ambulatory Visit (INDEPENDENT_AMBULATORY_CARE_PROVIDER_SITE_OTHER): Payer: No Typology Code available for payment source | Admitting: Internal Medicine

## 2020-06-01 ENCOUNTER — Encounter (INDEPENDENT_AMBULATORY_CARE_PROVIDER_SITE_OTHER): Payer: Self-pay | Admitting: Internal Medicine

## 2020-06-01 VITALS — BP 136/80 | HR 81 | Temp 97.9°F | Resp 18 | Ht 65.0 in | Wt 218.0 lb

## 2020-06-01 DIAGNOSIS — Z6835 Body mass index (BMI) 35.0-35.9, adult: Secondary | ICD-10-CM

## 2020-06-01 DIAGNOSIS — E282 Polycystic ovarian syndrome: Secondary | ICD-10-CM

## 2020-06-01 DIAGNOSIS — E6609 Other obesity due to excess calories: Secondary | ICD-10-CM

## 2020-06-01 MED ORDER — SPIRONOLACTONE 50 MG PO TABS
50.0000 mg | ORAL_TABLET | Freq: Every day | ORAL | 3 refills | Status: DC
Start: 2020-06-01 — End: 2020-09-15

## 2020-06-01 MED ORDER — NP THYROID 60 MG PO TABS
60.0000 mg | ORAL_TABLET | Freq: Every day | ORAL | 3 refills | Status: DC
Start: 2020-06-01 — End: 2020-08-19

## 2020-06-01 NOTE — Progress Notes (Signed)
Metrics: Intervention Frequency ACO  Documented Smoking Status Yearly  Screened one or more times in 24 months  Cessation Counseling or  Active cessation medication Past 24 months  Past 24 months   Guideline developer: UpToDate (See UpToDate for funding source) Date Released: 2014       Wellness Office Visit  Subjective:  Patient ID: Emily Morales, female    DOB: [birth year]  Age: 29 y.o. MRN: [id number]  CC: This 29 year old lady comes to our office to establish care. HPI  She has a previous diagnosis of PCOS.  She is on a birth control patch once a week.  She is complaining of daily hot flashes.  She complains of hirsutism, acne, irregular menstrual cycles. She describes fatigue. Past Medical History:  Diagnosis Date  . Abnormal Pap smear   . Anemia   . Anxiety   . Arthritis   . Chronic back pain   . Depression   . Dysrhythmia    irregular heartrate  . GERD (gastroesophageal reflux disease)   . Headache(784.0)   . Hx of chlamydia infection   . Ovarian cyst, right   . Perimenopausal   . Uterine fibroid    Past Surgical History:  Procedure Laterality Date  . BIOPSY  11/14/2016   Procedure: BIOPSY;  Surgeon: Danie Binder, MD;  Location: [facility] SU[facility];  Service: Endoscopy;;  duodenal gastric  . COLPOSCOPY    . ESOPHAGOGASTRODUODENOSCOPY (EGD) WITH PROPOFOL N/A 11/14/2016   Dr. Oneida Alar: Mild gastritis, normal esophagus, biopsy showed inflammation of the small bowel, gastric biopsies with no H. pylori.  . lipoma surgery    . MASS EXCISION N/A 02/28/2017   Procedure: EXCISION LIPOMA SCALP 3CM;  Surgeon: Aviva Signs, MD;  Location: [facility] ORS;  Service: General;  Laterality: N/A;  . UMBILICAL HERNIA REPAIR N/A 04/04/2019   Procedure: HERNIA REPAIR UMBILICAL ADULT WITH MESH;  Surgeon: Virl Cagey, MD;  Location: [facility] ORS;  Service: General;  Laterality: N/A;     Family History  Problem Relation Age of Onset  . Diabetes Father   . Hypertension Father   . Other Son         reactive airway disease  . Asthma Son   . Heart murmur Son   . Depression Mother   . Anxiety disorder Mother   . Cancer Maternal Grandmother        lung  . Lung cancer Maternal Grandmother   . Heart disease Paternal Grandmother   . Hypertension Paternal Grandmother   . Liver disease Paternal Grandfather        etoh related  . Alcohol abuse Maternal Grandfather   . 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   Social History   Tobacco Use  . Smoking status: Never Smoker  . Smokeless tobacco: Never Used  Substance Use Topics  . Alcohol use: Yes    Comment: occassional     Current Meds  Medication Sig  . albuterol (VENTOLIN HFA) 108 (90 Base) MCG/ACT inhaler 2 puffs every 4-6 hours for chest tightness, coughing, shortness of breath  . EPINEPHrine 0.3 mg/0.3 mL IJ SOAJ injection SMARTSIG:1 Pre-Filled Pen Syringe IM PRN  . fluticasone (FLONASE) 50 MCG/ACT nasal spray Place 2 sprays into both nostrils daily.  . montelukast (SINGULAIR) 10 MG tablet   . norelgestromin-ethinyl estradiol Marilu Favre) 150-35 MCG/24HR transdermal patch Place 1 patch onto the  skin once a week.  . [DISCONTINUED] norelgestromin-ethinyl estradiol (ORTHO EVRA) 150-35 MCG/24HR transdermal patch Place 1 patch onto the skin once a week.   Current Facility-Administered Medications for the 06/01/20 encounter (Office Visit) with Doree Albee, MD  Medication  . omalizumab Arvid Right) injection 300 mg      Depression screen [facility] 2/9 06/01/2020 01/05/2020 09/30/2019 04/21/2019 12/13/2018  Decreased Interest 0 0 0 2 3  Down, Depressed, Hopeless 0 0 0 0 1  PHQ - 2 Score 0 0 0 2 4  Altered sleeping 3 0 0 1 0  Tired, decreased energy 0 0 0 2 1  Change in appetite 3 0 0 3 3  Feeling bad or failure about yourself  0 0 0 0 0  Trouble concentrating 0 0 0 1 3  Moving slowly or fidgety/restless 0 0 0 0 0  Suicidal  thoughts 0 0 0 0 0  PHQ-9 Score 6 0 0 9 11  Difficult doing work/chores - Not difficult at all Not difficult at all - Somewhat difficult  Some recent data might be hidden     Objective:   Today's Vitals: BP 136/80 (BP Location: Right Arm, Patient Position: Sitting, Cuff Size: Normal)   Pulse 81   Temp 97.9 F (36.6 C) (Temporal)   Resp 18   Ht 5\' 5"  (1.651 m)   Wt 218 lb (98.9 kg)   LMP  (Exact Date) Comment: started 05/30/2020  SpO2 98%   BMI 36.28 kg/m  Vitals with BMI 06/01/2020 01/05/2020 12/28/2019  Height 5\' 5"  5\' 5"  -  Weight 218 lbs 213 lbs -  BMI 34.19 62.22 -  Systolic 979 (No Data) 892  Diastolic 80 (No Data) 83  Pulse 81 - 94     Physical Exam   She is obese.  Blood pressure is acceptable.    Assessment   1. Class 2 obesity due to excess calories without serious comorbidity with body mass index (BMI) of 35.0 to 35.9 in adult   2. PCOS (polycystic ovarian syndrome)       Tests ordered Orders Placed This Encounter  Procedures  . Ambulatory referral to Gynecology     Plan: 1. We discussed PCOS in detail and the etiology of insulin resistance and increased visceral fat. 2. We discussed nutrition and the concept of intermittent fasting combined with a plant-based diet.  I have given her diet sheet regarding this. 3. She does have symptoms of thyroid hypofunction including fatigue, weight gain and I will treat her for symptoms, off label with NP thyroid. 4. I will start her on spironolactone for the peripheral testosterone effects on the skin due to PCOS. 5. Follow-up in about 4 to 6 weeks time to see how she is doing and we will do all the blood work then.  I will also send her to Dr. Runell Gess, OB/GYN regarding her PCOS and birth control medicine.   Meds ordered this encounter  Medications  . NP THYROID 60 MG tablet    Sig: Take 1 tablet (60 mg total) by mouth daily before breakfast.    Dispense:  30 tablet    Refill:  3  . spironolactone (ALDACTONE)  50 MG tablet    Sig: Take 1 tablet (50 mg total) by mouth daily.    Dispense:  30 tablet    Refill:  3    Bralynn Donado Luther Parody, MD

## 2020-06-01 NOTE — Patient Instructions (Addendum)
[facility] Dietary Recommendations for Weight Loss What to Avoid . Avoid added sugars o Often added sugar can be found in processed foods such as many condiments, dry cereals, cakes, cookies, chips, crisps, crackers, candies, sweetened drinks, etc.  o Read labels and AVOID/DECREASE use of foods with the following in their ingredient list: Sugar, fructose, high fructose corn syrup, sucrose, glucose, maltose, dextrose, molasses, cane sugar, brown sugar, any type of syrup, agave nectar, etc.   . Avoid snacking in between meals . Avoid foods made with flour o If you are going to eat food made with flour, choose those made with whole-grains; and, minimize your consumption as much as is tolerable . Avoid processed foods o These foods are generally stocked in the middle of the grocery store. Focus on shopping on the perimeter of the grocery.  . Avoid Meat  o We recommend following a plant-based diet at [facility]. Thus, we recommend avoiding meat as a general rule. Consider eating beans, legumes, eggs, and/or dairy products for regular protein sources o If you plan on eating meat limit to 4 ounces of meat at a time and choose lean options such as Fish, chicken, turkey. Avoid red meat intake such as pork and/or steak What to Include . Vegetables o GREEN LEAFY VEGETABLES: Kale, spinach, mustard greens, collard greens, cabbage, broccoli, etc. o OTHER: Asparagus, cauliflower, eggplant, carrots, peas, Brussel sprouts, tomatoes, bell peppers, zucchini, beets, cucumbers, etc. . Grains, seeds, and legumes o Beans: kidney beans, black eyed peas, garbanzo beans, black beans, pinto beans, etc. o Whole, unrefined grains: brown rice, barley, bulgur, oatmeal, etc. . Healthy fats  o Avoid highly processed fats such as vegetable oil o Examples of healthy fats: avocado, olives, virgin olive oil, dark chocolate (?72% Cocoa), nuts (peanuts, almonds, walnuts, cashews, pecans, etc.) . None to Low  Intake of Animal Sources of Protein o Meat sources: chicken, turkey, salmon, tuna. Limit to 4 ounces of meat at one time. o Consider limiting dairy sources, but when choosing dairy focus on: PLAIN Greek yogurt, cottage cheese, high-protein milk . Fruit o Choose berries  When to Eat . Intermittent Fasting: o Choosing not to eat for a specific time period, but DO FOCUS ON HYDRATION when fasting o Multiple Techniques: - Time Restricted Eating: eat 3 meals in a day, each meal lasting no more than 60 minutes, no snacks between meals - 16-18 hour fast: fast for 16 to 18 hours up to 7 days a week. Often suggested to start with 2-3 nonconsecutive days per week.  . Remember the time you sleep is counted as fasting.  . Examples of eating schedule: Fast from 7:00pm-11:00am. Eat between 11:00am-7:00pm.  - 24-hour fast: fast for 24 hours up to every other day. Often suggested to start with 1 day per week . Remember the time you sleep is counted as fasting . Examples of eating schedule:  o Eating day: eat 2-3 meals on your eating day. If doing 2 meals, each meal should last no more than 90 minutes. If doing 3 meals, each meal should last no more than 60 minutes. Finish last meal by 7:00pm. o Fasting day: Fast until 7:00pm.  o IF YOU FEEL UNWELL FOR ANY REASON/IN ANY WAY WHEN FASTING, STOP FASTING BY EATING A NUTRITIOUS SNACK OR LIGHT MEAL o ALWAYS FOCUS ON HYDRATION DURING FASTS - Acceptable Hydration sources: water, broths, tea/coffee (black tea/coffee is best but using a small amount of whole-fat dairy products in coffee/tea is acceptable).  -   Poor Hydration Sources: anything with sugar or artificial sweeteners added to it  These recommendations have been developed for patients that are actively receiving medical care from either Dr. Magdelyn Roebuck or Sarah Gray, DNP, NP-C at [facility]. These recommendations are developed for patients with specific medical conditions and are not meant to be  distributed or used by others that are not actively receiving care from either provider listed above at [facility]. It is not appropriate to participate in the above eating plans without proper medical supervision.   Reference: Fung, J. The obesity code. [place]: [place]; 2016.   

## 2020-06-22 ENCOUNTER — Encounter (INDEPENDENT_AMBULATORY_CARE_PROVIDER_SITE_OTHER): Payer: Self-pay

## 2020-06-22 ENCOUNTER — Telehealth (INDEPENDENT_AMBULATORY_CARE_PROVIDER_SITE_OTHER): Payer: No Typology Code available for payment source | Admitting: Internal Medicine

## 2020-07-03 ENCOUNTER — Encounter (INDEPENDENT_AMBULATORY_CARE_PROVIDER_SITE_OTHER): Payer: Self-pay | Admitting: Internal Medicine

## 2020-07-05 ENCOUNTER — Ambulatory Visit: Payer: Self-pay

## 2020-07-08 ENCOUNTER — Other Ambulatory Visit: Payer: Self-pay

## 2020-07-08 ENCOUNTER — Encounter (INDEPENDENT_AMBULATORY_CARE_PROVIDER_SITE_OTHER): Payer: Self-pay | Admitting: Internal Medicine

## 2020-07-08 ENCOUNTER — Ambulatory Visit (INDEPENDENT_AMBULATORY_CARE_PROVIDER_SITE_OTHER): Payer: No Typology Code available for payment source | Admitting: Internal Medicine

## 2020-07-08 VITALS — BP 138/82 | HR 85 | Temp 97.8°F | Resp 18 | Ht 60.0 in | Wt 221.8 lb

## 2020-07-08 DIAGNOSIS — R4586 Emotional lability: Secondary | ICD-10-CM

## 2020-07-08 DIAGNOSIS — E282 Polycystic ovarian syndrome: Secondary | ICD-10-CM | POA: Diagnosis not present

## 2020-07-08 MED ORDER — ALYACEN 1/35 1-35 MG-MCG PO TABS: 1.0000 | ORAL_TABLET | Freq: Every day | ORAL | 11 refills | Status: DC

## 2020-07-08 MED ORDER — NP THYROID 90 MG PO TABS
90.0000 mg | ORAL_TABLET | Freq: Every day | ORAL | 3 refills | Status: DC
Start: 2020-07-08 — End: 2020-11-11

## 2020-07-08 NOTE — Progress Notes (Signed)
Metrics: Intervention Frequency ACO  Documented Smoking Status Yearly  Screened one or more times in 24 months  Cessation Counseling or  Active cessation medication Past 24 months  Past 24 months   Guideline developer: UpToDate (See UpToDate for funding source) Date Released: 2014       Wellness Office Visit  Subjective:  Patient ID: Emily Morales, female    DOB: August 19, 1990  Age: 29 y.o. MRN: [id number]  CC: Mood changes. HPI  This lady with PCOS comes in because she had an episode of severe low mood/being tearful and anxious. The last time I saw her about 5 weeks ago I started on NP thyroid and spironolactone.  She does feel that she has somewhat more energy but not as much as she would like.  Her menstrual cycles have become more regular now since this time. She is not using any birth control at the present time and she is awaiting gynecology appointment with Dr. Helane Rima.  She has not been sexually active either since she has no birth control. Past Medical History:  Diagnosis Date  . Abnormal Pap smear   . Anemia   . Anxiety   . Arthritis   . Chronic back pain   . Depression   . Dysrhythmia    irregular heartrate  . GERD (gastroesophageal reflux disease)   . Headache(784.0)   . Hx of chlamydia infection   . Ovarian cyst, right   . Perimenopausal   . Uterine fibroid    Past Surgical History:  Procedure Laterality Date  . BIOPSY  11/14/2016   Procedure: BIOPSY;  Surgeon: Danie Binder, MD;  Location: [facility];  Service: Endoscopy;;  duodenal gastric  . COLPOSCOPY    . ESOPHAGOGASTRODUODENOSCOPY (EGD) WITH PROPOFOL N/A 11/14/2016   Dr. Oneida Alar: Mild gastritis, normal esophagus, biopsy showed inflammation of the small bowel, gastric biopsies with no H. pylori.  . lipoma surgery    . MASS EXCISION N/A 02/28/2017   Procedure: EXCISION LIPOMA SCALP 3CM;  Surgeon: Aviva Signs, MD;  Location: [facility];  Service: General;  Laterality: N/A;  . UMBILICAL HERNIA REPAIR N/A  04/04/2019   Procedure: HERNIA REPAIR UMBILICAL ADULT WITH MESH;  Surgeon: Virl Cagey, MD;  Location: [facility];  Service: General;  Laterality: N/A;     Family History  Problem Relation Age of Onset  . Diabetes Father   . Hypertension Father   . Other Son        reactive airway disease  . Asthma Son   . Heart murmur Son   . Depression Mother   . Anxiety disorder Mother   . Cancer Maternal Grandmother        lung  . Lung cancer Maternal Grandmother   . Heart disease Paternal Grandmother   . Hypertension Paternal Grandmother   . Liver disease Paternal Grandfather        etoh related  . Alcohol abuse Maternal Grandfather   . 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   Social History   Tobacco Use  . Smoking status: Never Smoker  . Smokeless tobacco: Never Used  Substance Use Topics  . Alcohol use: Yes    Comment: occassional     Current Meds  Medication Sig  . albuterol (VENTOLIN HFA) 108 (90 Base) MCG/ACT inhaler 2 puffs every 4-6 hours for chest tightness, coughing, shortness of  breath  . EPINEPHrine 0.3 mg/0.3 mL IJ SOAJ injection SMARTSIG:1 Pre-Filled Pen Syringe IM PRN  . NP THYROID 60 MG tablet Take 1 tablet (60 mg total) by mouth daily before breakfast.  . spironolactone (ALDACTONE) 50 MG tablet Take 1 tablet (50 mg total) by mouth daily.   Current Facility-Administered Medications for the 07/08/20 encounter (Office Visit) with Doree Albee, MD  Medication  . omalizumab Arvid Right) injection 300 mg      Depression screen [facility] 2/9 06/01/2020 01/05/2020 09/30/2019 04/21/2019 12/13/2018  Decreased Interest 0 0 0 2 3  Down, Depressed, Hopeless 0 0 0 0 1  PHQ - 2 Score 0 0 0 2 4  Altered sleeping 3 0 0 1 0  Tired, decreased energy 0 0 0 2 1  Change in appetite 3 0 0 3 3  Feeling bad or failure about yourself  0 0 0 0 0  Trouble concentrating 0 0 0 1 3  Moving  slowly or fidgety/restless 0 0 0 0 0  Suicidal thoughts 0 0 0 0 0  PHQ-9 Score 6 0 0 9 11  Difficult doing work/chores - Not difficult at all Not difficult at all - Somewhat difficult  Some recent data might be hidden     Objective:   Today's Vitals: BP 138/82 (BP Location: Right Arm, Patient Position: Sitting, Cuff Size: Normal)   Pulse 85   Temp 97.8 F (36.6 C) (Temporal)   Resp 18   Ht 5' (1.524 m)   Wt 221 lb 12.8 oz (100.6 kg)   SpO2 98%   BMI 43.32 kg/m  Vitals with BMI 07/08/2020 06/01/2020 01/05/2020  Height 5\' 0"  5\' 5"  5\' 5"   Weight 221 lbs 13 oz 218 lbs 213 lbs  BMI 43.32 22.63 33.54  Systolic 562 563 (No Data)  Diastolic 82 80 (No Data)  Pulse 85 81 -     Physical Exam  She looks systemically well.  Weight is unchanged.     Assessment   1. PCOS (polycystic ovarian syndrome)   2. Mood changes       Tests ordered No orders of the defined types were placed in this encounter.    Plan: 1. I will try her on birth control to see if this will help her mood changes.  We will use a daily use birth control.  Await gynecology referral.  She will let me know if she does not improve. 2. I will also further optimize thyroid hypofunction and she will now take NP thyroid 60 mg alternating with NP thyroid 90 mg daily. 3. Follow-up in 2 months.   Meds ordered this encounter  Medications  . norethindrone-ethinyl estradiol 1/35 (ALAYCEN 1/35) tablet    Sig: Take 1 tablet by mouth daily.    Dispense:  28 tablet    Refill:  11  . NP THYROID 90 MG tablet    Sig: Take 1 tablet (90 mg total) by mouth daily.    Dispense:  30 tablet    Refill:  3    Heston Widener Luther Parody, MD

## 2020-07-12 ENCOUNTER — Ambulatory Visit (INDEPENDENT_AMBULATORY_CARE_PROVIDER_SITE_OTHER): Payer: No Typology Code available for payment source | Admitting: Internal Medicine

## 2020-07-19 ENCOUNTER — Telehealth: Payer: No Typology Code available for payment source | Admitting: Family

## 2020-07-19 DIAGNOSIS — M545 Low back pain, unspecified: Secondary | ICD-10-CM

## 2020-07-19 MED ORDER — BACLOFEN 10 MG PO TABS: 10.0000 mg | ORAL_TABLET | Freq: Three times a day (TID) | ORAL | 0 refills | Status: DC

## 2020-07-19 MED ORDER — NAPROXEN 500 MG PO TABS: 500.0000 mg | ORAL_TABLET | Freq: Two times a day (BID) | ORAL | 0 refills | Status: DC

## 2020-07-19 NOTE — Progress Notes (Signed)

## 2020-07-27 ENCOUNTER — Telehealth: Payer: No Typology Code available for payment source | Admitting: Physician Assistant

## 2020-07-27 DIAGNOSIS — Z20822 Contact with and (suspected) exposure to covid-19: Secondary | ICD-10-CM

## 2020-07-27 MED ORDER — BENZONATATE 100 MG PO CAPS: 100.0000 mg | ORAL_CAPSULE | Freq: Three times a day (TID) | ORAL | 0 refills | Status: DC | PRN

## 2020-07-27 MED ORDER — FLUTICASONE PROPIONATE 50 MCG/ACT NA SUSP: 2.0000 | Freq: Every day | NASAL | 0 refills | Status: DC

## 2020-07-27 NOTE — Progress Notes (Signed)
E-Visit for [employer] Virus Screening  Your current symptoms could be consistent with the coronavirus especially since you have had a positive exposure.  Even though your rapid test was negative I recommend a formal PCR test as you likely have COVID..     Many health care providers can now test patients at their office but not all are.  [place] has multiple testing sites. For information on our [place] testing locations and hours go to [email or web address]  We are enrolling you in our [place] for [place] . Daily you will receive a questionnaire within the [place] website. Our COVID 19 response team will be monitoring your responses daily.  Testing Information: The COVID-19 Community Testing sites are testing BY APPOINTMENT ONLY.  You can schedule online at [email or web address]  If you do not have access to a smart phone or computer you may call [phone] for an appointment.   Additional testing sites in the Community:  . For [facility] Testing sites in [facility], [facility], [facility]  [email or web address]  . For Pop-up testing sites in [place]  [email or web address]  . For [facility] [email or web address]  . For [facility] testing in [place] and [employer] [email or web address]  . For [facility] testing in [facility]   [email or web address]  For  more information about community testing call [phone]   Please quarantine yourself while awaiting your test results. Please stay home for a minimum of 10 days from the first day of illness with improving symptoms and you have had 24 hours of no fever (without the use of Tylenol (Acetaminophen) Motrin  (Ibuprofen) or any fever reducing medication).  Also - Do not get tested prior to returning to work because once you have had a positive test the test can stay positive for more than a month in some cases.   You should wear a mask or cloth face covering over your nose and mouth if you must be around other people or animals, including pets (even at home). Try to stay at least 6 feet away from other people. This will protect the people around you.  Please continue good preventive care measures, including:  frequent hand-washing, avoid touching your face, cover coughs/sneezes, stay out of crowds and keep a 6 foot distance from others.  COVID-19 is a respiratory illness with symptoms that are similar to the flu. Symptoms are typically mild to moderate, but there have been cases of severe illness and death due to the virus.   The following symptoms may appear 2-14 days after exposure: . Fever . Cough . Shortness of breath or difficulty breathing . Chills . Repeated shaking with chills . Muscle pain . Headache . Sore throat . New loss of taste or smell . Fatigue . Congestion or runny nose . Nausea or vomiting . Diarrhea  Go to the nearest hospital ED for assessment if fever/cough/breathlessness are severe or illness seems like a threat to life.  It is vitally important that if you feel that you have an infection such as this virus or any other virus that you stay home and away from places where you may spread it to others.  You should avoid contact with people age 70 and older.   You can use medication such as A prescription cough medication called Tessalon Perles 100 mg. You may take 1-2 capsules every 8 hours as needed for cough and A prescription for Fluticasone nasal spray 2 sprays in each nostril one time per day  You may also take acetaminophen (Tylenol) as  needed for fever.  Reduce your risk of any infection by using the same precautions used for avoiding the common cold or flu:  Marland Kitchen Wash  your hands often with soap and warm water for at least 20 seconds.  If soap and water are not readily available, use an alcohol-based hand sanitizer with at least 60% alcohol.  . If coughing or sneezing, cover your mouth and nose by coughing or sneezing into the elbow areas of your shirt or coat, into a tissue or into your sleeve (not your hands). . Avoid shaking hands with others and consider head nods or verbal greetings only. . Avoid touching your eyes, nose, or mouth with unwashed hands.  . Avoid close contact with people who are sick. . Avoid places or events with large numbers of people in one location, like concerts or sporting events. . Carefully consider travel plans you have or are making. . If you are planning any travel outside or inside the [place], visit the CDC's Travelers' Health webpage for the latest health notices. . If you have some symptoms but not all symptoms, continue to monitor at home and seek medical attention if your symptoms worsen. . If you are having a medical emergency, call 911.  HOME CARE . Only take medications as instructed by your medical team. . Drink plenty of fluids and get plenty of rest. . A steam or ultrasonic humidifier can help if you have congestion.   GET HELP RIGHT AWAY IF YOU HAVE EMERGENCY WARNING SIGNS** FOR COVID-19. If you or someone is showing any of these signs seek emergency medical care immediately. Call 911 or proceed to your closest emergency facility if: . You develop worsening high fever. . Trouble breathing . Bluish lips or face . Persistent pain or pressure in the chest . New confusion . Inability to wake or stay awake . You cough up blood. . Your symptoms become more severe  **This list is not all possible symptoms. Contact your medical provider for any symptoms that are sever or concerning to you.  MAKE SURE YOU   Understand these instructions.  Will watch your condition.  Will get help right away if you are not doing well  or get worse.  Your e-visit answers were reviewed by a board certified advanced clinical practitioner to complete your personal care plan.  Depending on the condition, your plan could have included both over the counter or prescription medications.  If there is a problem please reply once you have received a response from your provider.  Your safety is important to [place].  If you have drug allergies check your prescription carefully.    You can use MyChart to ask questions about today's visit, request a non-urgent call back, or ask for a work or school excuse for 24 hours related to this e-Visit. If it has been greater than 24 hours you will need to follow up with your provider, or enter a new e-Visit to address those concerns. You will get an e-mail in the next two days asking about your experience.  I hope that your e-visit has been valuable and will speed your recovery. Thank you for using e-visits.   Greater than 5 minutes, yet less than 10 minutes of time have been spent researching, coordinating, and implementing care for this patient today

## 2020-07-28 ENCOUNTER — Encounter (INDEPENDENT_AMBULATORY_CARE_PROVIDER_SITE_OTHER): Payer: Self-pay | Admitting: Internal Medicine

## 2020-07-29 ENCOUNTER — Other Ambulatory Visit: Payer: Self-pay | Admitting: Family

## 2020-08-01 ENCOUNTER — Telehealth: Payer: No Typology Code available for payment source | Admitting: Family

## 2020-08-01 DIAGNOSIS — U071 COVID-19: Secondary | ICD-10-CM

## 2020-08-01 MED ORDER — ALBUTEROL SULFATE HFA 108 (90 BASE) MCG/ACT IN AERS: 2.0000 | INHALATION_SPRAY | Freq: Four times a day (QID) | RESPIRATORY_TRACT | 0 refills | Status: DC | PRN

## 2020-08-01 MED ORDER — BENZONATATE 100 MG PO CAPS: 100.0000 mg | ORAL_CAPSULE | Freq: Three times a day (TID) | ORAL | 0 refills | Status: DC | PRN

## 2020-08-01 NOTE — Progress Notes (Signed)
E-Visit for Corona Virus Screening  We are sorry you are not feeling well. We are here to help!  You have tested positive for COVID-19, meaning that you were infected with the novel coronavirus and could give the virus to others.  It is vitally important that you stay home so you do not spread it to others.      Please continue isolation at home, for at least 10 days since the start of your symptoms and until you have had 24 hours with no fever (without taking a fever reducer) and with improving of symptoms.  Most cases improve 10 days from onset but we have seen a small number of patients who have gotten worse after the 10 days.  Please be sure to watch for worsening symptoms and remain taking the proper precautions.   Go to the nearest hospital ED for assessment if fever/cough/breathlessness are severe or illness seems like a threat to life.    The following symptoms may appear 2-14 days after exposure: . Fever . Cough . Shortness of breath or difficulty breathing . Chills . Repeated shaking with chills . Muscle pain . Headache . Sore throat . New loss of taste or smell . Fatigue . Congestion or runny nose . Nausea or vomiting . Diarrhea  You have been enrolled in [facility] Monitoring for COVID-19. Daily you will receive a questionnaire within the MyChart website. Our COVID-19 response team will be monitoring your responses daily.  You can use medication such as A prescription cough medication called Tessalon Perles 100 mg. You may take 1-2 capsules every 8 hours as needed for cough and A prescription inhaler called Albuterol MDI 90 mcg /actuation 2 puffs every 4 hours as needed for shortness of breath, wheezing, cough  You have tested positive for Covid but because you are not considered high risk you do not qualify for monoclonal antibody infusion.  Supportive care is all that is needed.   You may also take acetaminophen (Tylenol) as needed for fever.  HOME CARE: . Only  take medications as instructed by your medical team. . Drink plenty of fluids and get plenty of rest. . A steam or ultrasonic humidifier can help if you have congestion.   GET HELP RIGHT AWAY IF YOU HAVE EMERGENCY WARNING SIGNS.  Call 911 or proceed to your closest emergency facility if: . You develop worsening high fever. . Trouble breathing . Bluish lips or face . Persistent pain or pressure in the chest . New confusion . Inability to wake or stay awake . You cough up blood. . Your symptoms become more severe . Inability to hold down food or fluids  This list is not all possible symptoms. Contact your medical provider for any symptoms that are severe or concerning to you.    Your e-visit answers were reviewed by a board certified advanced clinical practitioner to complete your personal care plan.  Depending on the condition, your plan could have included both over the counter or prescription medications.  If there is a problem please reply once you have received a response from your provider.  Your safety is important to [place].  If you have drug allergies check your prescription carefully.    You can use MyChart to ask questions about today's visit, request a non-urgent call back, or ask for a work or school excuse for 24 hours related to this e-Visit. If it has been greater than 24 hours you will need to follow up with your provider, or  enter a new e-Visit to address those concerns. You will get an e-mail in the next two days asking about your experience.  I hope that your e-visit has been valuable and will speed your recovery. Thank you for using e-visits.    Approximately 5 minutes was spent documenting and reviewing patient's chart.

## 2020-08-02 ENCOUNTER — Telehealth: Payer: Self-pay

## 2020-08-02 ENCOUNTER — Encounter (INDEPENDENT_AMBULATORY_CARE_PROVIDER_SITE_OTHER): Payer: Self-pay

## 2020-08-02 NOTE — Telephone Encounter (Signed)
Patient also advised on vomiting as follows:   If appetite becomes worse: encourage patient to drink fluids as tolerated, work their way up to bland solid food such as crackers, pretzels, soup, bread or applesauce and boiled starches.   If patient is unable to tolerate any foods or liquids, notify PCP.   IF PATIENT DEVELOPS SEVERE VOMITING (MORE THAN 6 TIMES A DAY AND OR >8 HOURS) AND/OR SEVERE ABDOMINAL PAIN ADVISE PATIENT TO CALL 911 AND SEEK TREATMENT IN ED  Patient is not vomiting at this time and has been able to eat and hold something down

## 2020-08-02 NOTE — Telephone Encounter (Signed)
Patient states that she has had diarrhea just in the morning for last 2 days. Patient is not currently having diarrhea.  Patient was advised on diarrhea per the protocol as follows:   If diarrhea remains the same: encourage patient to drink oral fluids and bland foods.   Avoid alcohol, spicy foods, caffeine or fatty foods that could make diarrhea worse.   Continue to monitor for signs of dehydration (increased thirst decreased urine output, yellow urine, dry skin, headache or dizziness).   Advise patient to try OTC medication (Imodium, kaopectate, Pepto-Bismol) as per manufacturer's instructions.   If worsening diarrhea occurs and becomes severe (6-7 bowel movements a day): notify PCP   If diarrhea last greater than 7 days: notify PCP   IF SIGNS OF DEHYDRATION OCCUR (INCREASED THIRST, DECREASED URINE OUTPUT, YELLOW URINE, DRY SKIN, HEADACHE OR DIZZINESS) ADVISE PATIENT TO CALL 911 AND SEEK TREATMENT IN THE ED

## 2020-08-04 ENCOUNTER — Encounter (INDEPENDENT_AMBULATORY_CARE_PROVIDER_SITE_OTHER): Payer: Self-pay | Admitting: Internal Medicine

## 2020-08-04 ENCOUNTER — Other Ambulatory Visit (INDEPENDENT_AMBULATORY_CARE_PROVIDER_SITE_OTHER): Payer: Self-pay | Admitting: Internal Medicine

## 2020-08-08 ENCOUNTER — Telehealth: Payer: No Typology Code available for payment source | Admitting: Family

## 2020-08-08 DIAGNOSIS — N760 Acute vaginitis: Secondary | ICD-10-CM

## 2020-08-08 DIAGNOSIS — B9689 Other specified bacterial agents as the cause of diseases classified elsewhere: Secondary | ICD-10-CM

## 2020-08-08 MED ORDER — METRONIDAZOLE 500 MG PO TABS: 500.0000 mg | ORAL_TABLET | Freq: Two times a day (BID) | ORAL | 0 refills | Status: DC

## 2020-08-08 NOTE — Progress Notes (Signed)

## 2020-08-10 ENCOUNTER — Telehealth (INDEPENDENT_AMBULATORY_CARE_PROVIDER_SITE_OTHER): Payer: Self-pay

## 2020-08-10 NOTE — Telephone Encounter (Signed)
Pt called in tears saying her legs are really achy feeling in her upper thigh are. Pt is now home doing a epson salt soak, taken her muscle relaxers. Then being short of breath. She feelis it is all from the covid. Wanted to see if they is anything you might can tell her what it could be or help get relief. Please advise?

## 2020-08-11 NOTE — Telephone Encounter (Signed)
Set her a acute with sarah on 1/20th.

## 2020-08-11 NOTE — Telephone Encounter (Signed)
Her symptoms may be related to COVID.  Please make her an appointment to see Judson Roch next week as I will be out of the office all week except Thursday.

## 2020-08-11 NOTE — Telephone Encounter (Signed)
Pt called and given appt. Pt asked for a order  for imaging? I told her she would have to be seen 1st. And if it is severely in pain, she will need to go to urgent care or ER dept.  Wants to know what else she can do from home for pain during the time for appointment.

## 2020-08-19 ENCOUNTER — Encounter (INDEPENDENT_AMBULATORY_CARE_PROVIDER_SITE_OTHER): Payer: Self-pay | Admitting: Nurse Practitioner

## 2020-08-19 ENCOUNTER — Telehealth (INDEPENDENT_AMBULATORY_CARE_PROVIDER_SITE_OTHER): Payer: No Typology Code available for payment source | Admitting: Nurse Practitioner

## 2020-08-19 DIAGNOSIS — M7989 Other specified soft tissue disorders: Secondary | ICD-10-CM

## 2020-08-19 DIAGNOSIS — M5441 Lumbago with sciatica, right side: Secondary | ICD-10-CM | POA: Diagnosis not present

## 2020-08-19 MED ORDER — PREDNISONE 20 MG PO TABS: 40.0000 mg | ORAL_TABLET | Freq: Every day | ORAL | 0 refills | Status: DC

## 2020-08-19 NOTE — Progress Notes (Signed)
Due to national recommendations of social distancing related to the [place] pandemic, an audio-only tele-health visit was felt to be the most appropriate encounter type for this patient today. I connected with  Emily Morales on 08/19/20 utilizing audio-only technology and verified that I am speaking with the correct person using two identifiers. The patient was located at their place of employment, and I was located at the office of [facility] during the encounter. I discussed the limitations of evaluation and management by telemedicine. The patient expressed understanding and agreed to proceed.     Subjective:  Patient ID: Emily Morales, female    DOB: 1991/02/14  Age: 30 y.o. MRN: [id number]  CC:  Chief Complaint  Patient presents with  . Back Pain      HPI  This patient arrives today for virtual visit for the above.  She tells me that she had COVID approximately 1 month ago.  Tells me her symptoms started on 07/27/2020 and she tested positive that same day.  She tells me that overall her breathing seems to be better and she is not really experiencing any shortness of breath, but she is experiencing upper and low back pain.  She also was experiencing some bilateral thigh swelling about 1 week ago but the swelling has resolved on its own and the pain that was associated the swelling has also resolved.  She tells me the pain in her back is about a 7/10 in intensity.  The upper back pain that also appears to be in her right shoulder is described as dull.  Her lower back pain that shoots down her right leg is described as sharp.  She denies any weakness, sensory deficits, saddle paresthesias, or new bowel/bladder incontinence.  She does report that the area on her back is tender to touch.  Past Medical History:  Diagnosis Date  . Abnormal Pap smear   . Anemia   . Anxiety   . Arthritis   . Chronic back pain   . Depression   . Dysrhythmia    irregular heartrate   . GERD (gastroesophageal reflux disease)   . Headache(784.0)   . Hx of chlamydia infection   . Ovarian cyst, right   . Perimenopausal   . Uterine fibroid       Family History  Problem Relation Age of Onset  . Diabetes Father   . Hypertension Father   . Other Son        reactive airway disease  . Asthma Son   . Heart murmur Son   . Depression Mother   . Anxiety disorder Mother   . Cancer Maternal Grandmother        lung  . Lung cancer Maternal Grandmother   . Heart disease Paternal Grandmother   . Hypertension Paternal Grandmother   . Liver disease Paternal Grandfather        etoh related  . Alcohol abuse Maternal Grandfather   . 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   Social History   Tobacco Use  . Smoking status: Never Smoker  . Smokeless tobacco: Never Used  Substance Use Topics  . Alcohol use: Yes    Comment: occassional      Current Meds  Medication Sig  . albuterol (VENTOLIN HFA) 108 (90 Base) MCG/ACT inhaler Inhale 2 puffs into the lungs every 6 (  six) hours as needed for wheezing or shortness of breath.  . baclofen (LIORESAL) 10 MG tablet Take 1 tablet (10 mg total) by mouth 3 (three) times daily.  Marland Kitchen EPINEPHrine 0.3 mg/0.3 mL IJ SOAJ injection SMARTSIG:1 Pre-Filled Pen Syringe IM PRN  . norethindrone-ethinyl estradiol 1/35 (ALAYCEN 1/35) tablet Take 1 tablet by mouth daily.  . NP THYROID 90 MG tablet Take 1 tablet (90 mg total) by mouth daily.  . predniSONE (DELTASONE) 20 MG tablet Take 2 tablets (40 mg total) by mouth daily with breakfast.  . spironolactone (ALDACTONE) 50 MG tablet Take 1 tablet (50 mg total) by mouth daily.    ROS:  Review of Systems  HENT: Negative for congestion.   Respiratory: Positive for shortness of breath (occasionally ). Negative for cough.   Cardiovascular: Negative for chest pain and leg swelling (now resolved).   Musculoskeletal: Positive for back pain.     Objective:   Today's Vitals: There were no vitals taken for this visit. Vitals with BMI 07/08/2020 06/01/2020 01/05/2020  Height _0  _1  _2   Weight 221 lbs 13 oz 218 lbs 213 lbs  BMI 43.32 12.75 17.00  Systolic 174 944 (No Data)  Diastolic 82 80 (No Data)  Pulse 85 81 -     Physical Exam Comprehensive physical exam not completed today as office visit was conducted remotely.  The patient sounded well over the phone. Patient was alert and oriented, and appeared to have appropriate judgment.       Assessment and Plan   1. Acute right-sided low back pain with right-sided sciatica   2. Localized swelling of lower extremity      Plan: 1.  Etiology uncertain at this time, however back pain can be a symptom of COVID so this very well could be related to her recent COVID-19 infection.  We will treat empirically with prednisone.  We did discuss that she should avoid ibuprofen while taking the prednisone should instead take Tylenol as needed for additional pain relief.  We did discuss about possible side effects including insomnia, increased hunger, GI upset, and irritability.  We did discuss that if she can tolerate these side effects she should continue medication until its been completed but if not she can stop medication and notify us.  She tells me she understands.  She was told that if her symptoms persist the next week or are not improved that she should notify us and we may need to consider referral to physical therapy.  I also offered to provide her with phone number to the post [place] care clinic, but she would like to hold off on going to this clinic at this time.  In the event she decides she would like to be seen at this clinic the phone number is [phone]. 2.  The swelling in her bilateral lower extremities seems to have resolved.  Due to the risk of hypercoagulable state related to COVID-19 infection I did discuss this with Dr.  Anastasio Champion and he recommended the patient come in today to have a D-dimer level collected.  If this is elevated may need to consider ultrasound or other imaging for further evaluation.  Patient is agreeable to coming into the office after work today and I will order blood work for further evaluation.   Tests ordered Orders Placed This Encounter  Procedures  . D-dimer, Quantitative  . CMP with eGFR([facility])      Meds ordered this encounter  Medications  . predniSONE (DELTASONE) 20 MG tablet  Sig: Take 2 tablets (40 mg total) by mouth daily with breakfast.    Dispense:  10 tablet    Refill:  0    Order Specific Question:   Supervising Provider    Answer:   Doree Albee [[id number]]    Patient to follow-up as scheduled next month or sooner as needed.  Total time spent on the telephone today was 16 minutes and 37 seconds.  Ailene Ards, NP

## 2020-08-20 LAB — COMPLETE METABOLIC PANEL WITH GFR
AG Ratio: 1.3 (calc) (ref 1.0–2.5)
ALT: 12 U/L (ref 6–29)
AST: 14 U/L (ref 10–30)
Albumin: 4.2 g/dL (ref 3.6–5.1)
Alkaline phosphatase (APISO): 76 U/L (ref 31–125)
BUN: 13 mg/dL (ref 7–25)
CO2: 26 mmol/L (ref 20–32)
Calcium: 9.4 mg/dL (ref 8.6–10.2)
Chloride: 103 mmol/L (ref 98–110)
Creat: 0.7 mg/dL (ref 0.50–1.10)
GFR, Est African American: 134 mL/min/{1.73_m2} (ref >=60)
GFR, Est Non African American: 115 mL/min/{1.73_m2} (ref >=60)
Globulin: 3.3 g/dL (calc) (ref 1.9–3.7)
Glucose, Bld: 91 mg/dL (ref 65–139)
Potassium: 3.9 mmol/L (ref 3.5–5.3)
Sodium: 137 mmol/L (ref 135–146)
Total Bilirubin: 0.4 mg/dL (ref 0.2–1.2)
Total Protein: 7.5 g/dL (ref 6.1–8.1)

## 2020-08-20 LAB — D-DIMER, QUANTITATIVE: D-Dimer, Quant: 0.42 mcg/mL FEU (ref <0.50)

## 2020-08-24 ENCOUNTER — Encounter (INDEPENDENT_AMBULATORY_CARE_PROVIDER_SITE_OTHER): Payer: Self-pay | Admitting: Nurse Practitioner

## 2020-09-15 ENCOUNTER — Encounter (INDEPENDENT_AMBULATORY_CARE_PROVIDER_SITE_OTHER): Payer: Self-pay | Admitting: Internal Medicine

## 2020-09-15 ENCOUNTER — Other Ambulatory Visit: Payer: Self-pay

## 2020-09-15 ENCOUNTER — Ambulatory Visit (INDEPENDENT_AMBULATORY_CARE_PROVIDER_SITE_OTHER): Payer: No Typology Code available for payment source | Admitting: Internal Medicine

## 2020-09-15 VITALS — BP 118/80 | HR 79 | Temp 96.3°F | Ht 65.5 in | Wt 220.4 lb

## 2020-09-15 DIAGNOSIS — E282 Polycystic ovarian syndrome: Secondary | ICD-10-CM

## 2020-09-15 DIAGNOSIS — E6609 Other obesity due to excess calories: Secondary | ICD-10-CM

## 2020-09-15 DIAGNOSIS — Z6835 Body mass index (BMI) 35.0-35.9, adult: Secondary | ICD-10-CM | POA: Diagnosis not present

## 2020-09-15 DIAGNOSIS — R4586 Emotional lability: Secondary | ICD-10-CM | POA: Diagnosis not present

## 2020-09-15 MED ORDER — NP THYROID 120 MG PO TABS: 120.0000 mg | ORAL_TABLET | Freq: Every day | ORAL | 3 refills | Status: DC

## 2020-09-15 NOTE — Progress Notes (Signed)
Metrics: Intervention Frequency ACO  Documented Smoking Status Yearly  Screened one or more times in 24 months  Cessation Counseling or  Active cessation medication Past 24 months  Past 24 months   Guideline developer: UpToDate (See UpToDate for funding source) Date Released: 2014       Wellness Office Visit  Subjective:  Patient ID: Emily Morales, female    DOB: [birth year]  Age: 30 y.o. MRN: [id number]  CC: This lady comes in for follow-up regarding her PCOS. HPI   Judson Roch had seen her in the third week of January with symptoms related to [place] possibly and treated with steroids.  Apparently the prednisone made her itch and she discontinued it but she is no longer itching. She has tolerated the higher dose of NP thyroid without any problems whatsoever.  Unfortunately, she has not lost further weight. On closer questioning, she tells me that she eats chicken every single day. Past Medical History:  Diagnosis Date  . Abnormal Pap smear   . Anemia   . Anxiety   . Arthritis   . Chronic back pain   . Depression   . Dysrhythmia    irregular heartrate  . GERD (gastroesophageal reflux disease)   . Headache(784.0)   . Hx of chlamydia infection   . Ovarian cyst, right   . Perimenopausal   . Uterine fibroid    Past Surgical History:  Procedure Laterality Date  . BIOPSY  11/14/2016   Procedure: BIOPSY;  Surgeon: Danie Binder, MD;  Location: [facility];  Service: Endoscopy;;  duodenal gastric  . COLPOSCOPY    . ESOPHAGOGASTRODUODENOSCOPY (EGD) WITH PROPOFOL N/A 11/14/2016   Dr. Oneida Alar: Mild gastritis, normal esophagus, biopsy showed inflammation of the small bowel, gastric biopsies with no H. pylori.  . lipoma surgery    . MASS EXCISION N/A 02/28/2017   Procedure: EXCISION LIPOMA SCALP 3CM;  Surgeon: Aviva Signs, MD;  Location: [facility] ORS;  Service: General;  Laterality: N/A;  . UMBILICAL HERNIA REPAIR N/A 04/04/2019   Procedure: HERNIA REPAIR UMBILICAL ADULT WITH MESH;   Surgeon: Virl Cagey, MD;  Location: [facility] ORS;  Service: General;  Laterality: N/A;     Family History  Problem Relation Age of Onset  . Diabetes Father   . Hypertension Father   . Other Son        reactive airway disease  . Asthma Son   . Heart murmur Son   . Depression Mother   . Anxiety disorder Mother   . Cancer Maternal Grandmother        lung  . Lung cancer Maternal Grandmother   . Heart disease Paternal Grandmother   . Hypertension Paternal Grandmother   . Liver disease Paternal Grandfather        etoh related  . Alcohol abuse Maternal Grandfather   . 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   Social History   Tobacco Use  . Smoking status: Never Smoker  . Smokeless tobacco: Never Used  Substance Use Topics  . Alcohol use: Yes    Comment: occassional     Current Meds  Medication Sig  . albuterol (VENTOLIN HFA) 108 (90 Base) MCG/ACT inhaler Inhale 2 puffs into the lungs every 6 (six) hours as needed for wheezing or shortness of breath.  Jolyne Loa Grape-Goldenseal (BERBERINE COMPLEX) 200-200-50 MG CAPS Take 1,000 mg by  mouth.  . EPINEPHrine 0.3 mg/0.3 mL IJ SOAJ injection SMARTSIG:1 Pre-Filled Pen Syringe IM PRN  . NP THYROID 120 MG tablet Take 1 tablet (120 mg total) by mouth daily before breakfast.  . NP THYROID 90 MG tablet Take 1 tablet (90 mg total) by mouth daily.     [facility] Office Visit from 06/01/2020 in [facility]  PHQ-9 Total Score 6      Objective:   Today's Vitals: BP 118/80 (BP Location: Right Arm, Patient Position: Sitting, Cuff Size: Large)   Pulse 79   Temp (!) 96.3 F (35.7 C) (Temporal)   Ht 5' 5.5" (1.664 m)   Wt 220 lb 6.4 oz (100 kg)   LMP 08/27/2020   SpO2 99%   BMI 36.12 kg/m  Vitals with BMI 09/15/2020 07/08/2020 06/01/2020  Height 5' 5.5" 5\' 0"  5\' 5"   Weight 220 lbs 6 oz 221 lbs 13 oz 218 lbs   BMI 36.11 44.69 50.72  Systolic 257 505 183  Diastolic 80 82 80  Pulse 79 85 81     Physical Exam She looks systemically well.  She remains obese.  Blood pressure is in a good range.  She has not lost any weight.      Assessment   1. PCOS (polycystic ovarian syndrome)   2. Mood changes   3. Class 2 obesity due to excess calories without serious comorbidity with body mass index (BMI) of 35.0 to 35.9 in adult       Tests ordered No orders of the defined types were placed in this encounter.    Plan: 1. I think we can further optimize thyroid to help insulin resistance for PCOS.  I have sent a new prescription of NP thyroid 120 mg daily.  Thyroid is being used off label for symptoms of thyroid hypofunction and to help with insulin resistance. 2. Mood changes still persist and she is waiting to see the gynecologist.  We will need to make this referral again. 3. I will see her in about 6 weeks to see how she is doing and we will likely check blood work then.   Meds ordered this encounter  Medications  . NP THYROID 120 MG tablet    Sig: Take 1 tablet (120 mg total) by mouth daily before breakfast.    Dispense:  30 tablet    Refill:  3    Corleen Otwell Luther Parody, MD

## 2020-09-15 NOTE — Patient Instructions (Signed)
[facility] Dietary Recommendations for Weight Loss What to Avoid . Avoid added sugars o Often added sugar can be found in processed foods such as many condiments, dry cereals, cakes, cookies, chips, crisps, crackers, candies, sweetened drinks, etc.  o Read labels and AVOID/DECREASE use of foods with the following in their ingredient list: Sugar, fructose, high fructose corn syrup, sucrose, glucose, maltose, dextrose, molasses, cane sugar, brown sugar, any type of syrup, agave nectar, etc.   . Avoid snacking in between meals . Avoid foods made with flour o If you are going to eat food made with flour, choose those made with whole-grains; and, minimize your consumption as much as is tolerable . Avoid processed foods o These foods are generally stocked in the middle of the grocery store. Focus on shopping on the perimeter of the grocery.  . Avoid Meat  o We recommend following a plant-based diet at [facility]. Thus, we recommend avoiding meat as a general rule. Consider eating beans, legumes, eggs, and/or dairy products for regular protein sources o If you plan on eating meat limit to 4 ounces of meat at a time and choose lean options such as Fish, chicken, turkey. Avoid red meat intake such as pork and/or steak What to Include . Vegetables o GREEN LEAFY VEGETABLES: Kale, spinach, mustard greens, collard greens, cabbage, broccoli, etc. o OTHER: Asparagus, cauliflower, eggplant, carrots, peas, Brussel sprouts, tomatoes, bell peppers, zucchini, beets, cucumbers, etc. . Grains, seeds, and legumes o Beans: kidney beans, black eyed peas, garbanzo beans, black beans, pinto beans, etc. o Whole, unrefined grains: brown rice, barley, bulgur, oatmeal, etc. . Healthy fats  o Avoid highly processed fats such as vegetable oil o Examples of healthy fats: avocado, olives, virgin olive oil, dark chocolate (?72% Cocoa), nuts (peanuts, almonds, walnuts, cashews, pecans, etc.) . None to Low  Intake of Animal Sources of Protein o Meat sources: chicken, turkey, salmon, tuna. Limit to 4 ounces of meat at one time. o Consider limiting dairy sources, but when choosing dairy focus on: PLAIN Greek yogurt, cottage cheese, high-protein milk . Fruit o Choose berries  When to Eat . Intermittent Fasting: o Choosing not to eat for a specific time period, but DO FOCUS ON HYDRATION when fasting o Multiple Techniques: - Time Restricted Eating: eat 3 meals in a day, each meal lasting no more than 60 minutes, no snacks between meals - 16-18 hour fast: fast for 16 to 18 hours up to 7 days a week. Often suggested to start with 2-3 nonconsecutive days per week.  . Remember the time you sleep is counted as fasting.  . Examples of eating schedule: Fast from 7:00pm-11:00am. Eat between 11:00am-7:00pm.  - 24-hour fast: fast for 24 hours up to every other day. Often suggested to start with 1 day per week . Remember the time you sleep is counted as fasting . Examples of eating schedule:  o Eating day: eat 2-3 meals on your eating day. If doing 2 meals, each meal should last no more than 90 minutes. If doing 3 meals, each meal should last no more than 60 minutes. Finish last meal by 7:00pm. o Fasting day: Fast until 7:00pm.  o IF YOU FEEL UNWELL FOR ANY REASON/IN ANY WAY WHEN FASTING, STOP FASTING BY EATING A NUTRITIOUS SNACK OR LIGHT MEAL o ALWAYS FOCUS ON HYDRATION DURING FASTS - Acceptable Hydration sources: water, broths, tea/coffee (black tea/coffee is best but using a small amount of whole-fat dairy products in coffee/tea is acceptable).  -   Poor Hydration Sources: anything with sugar or artificial sweeteners added to it  These recommendations have been developed for patients that are actively receiving medical care from either Dr. Kaleth Koy or Sarah Gray, DNP, NP-C at [facility]. These recommendations are developed for patients with specific medical conditions and are not meant to be  distributed or used by others that are not actively receiving care from either provider listed above at [facility]. It is not appropriate to participate in the above eating plans without proper medical supervision.   Reference: Fung, J. The obesity code. [place]: [place]; 2016.   

## 2020-09-24 ENCOUNTER — Telehealth: Payer: No Typology Code available for payment source | Admitting: Physician Assistant

## 2020-09-24 DIAGNOSIS — R42 Dizziness and giddiness: Secondary | ICD-10-CM

## 2020-09-24 NOTE — Progress Notes (Signed)
Hi Emily Morales,  I am sorry you aren't feeling well. There are a lot of things that can cause symptoms of dizziness with a headache.  I am not able to treat you today without knowing what's causing your symptoms.  I would feel more comfortable if you were evaluated in person for a physical exam.   Based on what you shared with me, I feel your condition warrants further evaluation and I recommend that you be seen for a face to face office visit.   NOTE: If you entered your credit card information for this eVisit, you will not be charged. You may see a "hold" on your card for the $35 but that hold will drop off and you will not have a charge processed.   If you are having a true medical emergency please call 911.      For an urgent face to face visit, [facility] has five urgent care centers for your convenience:     [facility] [place] [facility] [place] Get Driving Directions [phone] [address], [place] [address] . 10 am - 6pm Monday - Friday    [facility]) Get Driving Directions [phone] [address] [place], [place] [address] . 10 am to 8 pm Monday-Friday . 12 pm to 8 pm [facility] Get Driving Directions [phone] [place], [place], [place] [address] . 8 am to 8 pm Monday-Friday . 9 am to 6 pm Saturday . 11 am to 6 pm Sunday     [facility] at [facility] Get Driving Directions  [phone] [address] [place], [place] [address] . 8 am to 8 pm Monday-Friday . 8 am to 4 pm [facility] at [place] Get Driving Directions [phone] [address] [place], [place] [address] . 12 pm to 6 pm Monday-Friday      Your e-visit answers were reviewed by a board certified advanced clinical practitioner to complete your personal care plan.  Thank you for using  e-Visits.

## 2020-10-04 ENCOUNTER — Other Ambulatory Visit (INDEPENDENT_AMBULATORY_CARE_PROVIDER_SITE_OTHER): Payer: Self-pay | Admitting: Nurse Practitioner

## 2020-10-04 ENCOUNTER — Encounter: Payer: Self-pay | Admitting: Allergy & Immunology

## 2020-10-04 DIAGNOSIS — J309 Allergic rhinitis, unspecified: Secondary | ICD-10-CM

## 2020-10-05 MED ORDER — MONTELUKAST SODIUM 10 MG PO TABS: 10.0000 mg | ORAL_TABLET | Freq: Every day | ORAL | 3 refills | Status: DC

## 2020-10-11 ENCOUNTER — Other Ambulatory Visit: Payer: Self-pay | Admitting: Allergy & Immunology

## 2020-10-11 ENCOUNTER — Other Ambulatory Visit: Payer: Self-pay | Admitting: *Deleted

## 2020-10-11 MED ORDER — PAZEO 0.7 % OP SOLN: 1.0000 [drp] | Freq: Every day | OPHTHALMIC | 5 refills | Status: DC

## 2020-10-13 ENCOUNTER — Other Ambulatory Visit (INDEPENDENT_AMBULATORY_CARE_PROVIDER_SITE_OTHER): Payer: Self-pay | Admitting: Nurse Practitioner

## 2020-10-13 DIAGNOSIS — M5441 Lumbago with sciatica, right side: Secondary | ICD-10-CM

## 2020-10-27 ENCOUNTER — Ambulatory Visit (INDEPENDENT_AMBULATORY_CARE_PROVIDER_SITE_OTHER): Payer: No Typology Code available for payment source | Admitting: Internal Medicine

## 2020-11-11 ENCOUNTER — Ambulatory Visit (INDEPENDENT_AMBULATORY_CARE_PROVIDER_SITE_OTHER): Payer: No Typology Code available for payment source | Admitting: Women's Health

## 2020-11-11 ENCOUNTER — Other Ambulatory Visit (HOSPITAL_COMMUNITY)
Admission: RE | Admit: 2020-11-11 | Discharge: 2020-11-11 | Disposition: A | Discharge status: Home / Self Care | Payer: No Typology Code available for payment source | Source: Ambulatory Visit | Attending: Obstetrics & Gynecology | Admitting: Obstetrics & Gynecology

## 2020-11-11 ENCOUNTER — Encounter: Payer: Self-pay | Admitting: Women's Health

## 2020-11-11 ENCOUNTER — Other Ambulatory Visit: Payer: Self-pay

## 2020-11-11 VITALS — BP 114/76 | HR 67 | Ht 65.5 in | Wt 221.0 lb

## 2020-11-11 DIAGNOSIS — Z1321 Encounter for screening for nutritional disorder: Secondary | ICD-10-CM

## 2020-11-11 DIAGNOSIS — Z30430 Encounter for insertion of intrauterine contraceptive device: Secondary | ICD-10-CM | POA: Insufficient documentation

## 2020-11-11 DIAGNOSIS — F418 Other specified anxiety disorders: Secondary | ICD-10-CM

## 2020-11-11 DIAGNOSIS — Z01419 Encounter for gynecological examination (general) (routine) without abnormal findings: Secondary | ICD-10-CM

## 2020-11-11 DIAGNOSIS — Z975 Presence of (intrauterine) contraceptive device: Secondary | ICD-10-CM | POA: Diagnosis not present

## 2020-11-11 DIAGNOSIS — Z131 Encounter for screening for diabetes mellitus: Secondary | ICD-10-CM

## 2020-11-11 DIAGNOSIS — K5900 Constipation, unspecified: Secondary | ICD-10-CM

## 2020-11-11 DIAGNOSIS — Z1329 Encounter for screening for other suspected endocrine disorder: Secondary | ICD-10-CM | POA: Diagnosis not present

## 2020-11-11 LAB — POCT URINE PREGNANCY: Preg Test, Ur: NEGATIVE

## 2020-11-11 MED ORDER — CITALOPRAM HYDROBROMIDE 20 MG PO TABS: ORAL_TABLET | ORAL | 2 refills | Status: DC

## 2020-11-11 MED ORDER — LEVONORGESTREL 19.5 MCG/DAY IU IUD: INTRAUTERINE_SYSTEM | Freq: Once | INTRAUTERINE | Status: AC

## 2020-11-11 NOTE — Patient Instructions (Signed)
Nothing in vagina for 3 days (no sex, douching, tampons, etc...)  Check your strings once a month to make sure you can feel them, if you are not able to please let us know  If you develop a fever of 100.4 or more in the next few weeks, or if you develop severe abdominal pain, please let [place] know  Use a backup method of birth control, such as condoms, for 2 weeks     Intrauterine Device Insertion, Care After This sheet gives you information about how to care for yourself after your procedure. Your health care provider may also give you more specific instructions. If you have problems or questions, contact your health care provider. What can I expect after the procedure? After the procedure, it is common to have:  Cramps and pain in the abdomen.  Bleeding. It may be light or heavy. This may last for a few days.  Lower back pain.  Dizziness.  Headaches.  Nausea. Follow these instructions at home:  Before resuming sexual activity, check to make sure that you can feel the IUD string or strings. You should be able to feel the end of the string below the opening of your cervix. If your IUD string is in place, you may resume sexual activity. ? If you had a hormonal IUD inserted more than 7 days after your most recent period started, you will need to use a backup method of birth control for 7 days after IUD insertion. Ask your health care provider whether this applies to you.  Continue to check that the IUD is still in place by feeling for the strings after every menstrual period, or once a month.  An IUD will not protect you from sexually transmitted infections (STIs). Use methods to prevent the exchange of body fluids between partners (barrier protection) every time you have sex. Barrier protection can be used during oral, vaginal, or anal sex. Commonly used barrier methods include: ? Female condom. ? Female condom. ? Dental dam.  Take over-the-counter and prescription medicines only as  told by your health care provider.  Keep all follow-up visits as told by your health care provider. This is important.   Contact a health care provider if:  You feel light-headed or weak.  You have any of the following problems with your IUD string or strings: ? The string bothers or hurts you or your sexual partner. ? You cannot feel the string. ? The string has gotten longer.  You can feel the IUD in your vagina.  You think you may be pregnant, or you miss your menstrual period.  You think you may have a sexually transmitted infection (STI). Get help right away if:  You have flu-like symptoms, such as tiredness (fatigue) and muscle aches.  You have a fever and chills.  You have bleeding that is heavier or lasts longer than a normal menstrual cycle.  You have abnormal or bad-smelling discharge from your vagina.  You develop abdominal pain that is new, is getting worse, or is not in the same area of earlier cramping and pain.  You have pain during sexual activity. Summary  After the procedure, it is common to have cramps and pain in the abdomen. It is also common to have light bleeding or heavier bleeding that is like your menstrual period.  Continue to check that the IUD is still in place by feeling for the strings after every menstrual period, or once a month.  Keep all follow-up visits as  told by your health care provider. This is important.  Contact your health care provider if you have problems with your IUD strings, such as the string getting longer or bothering you or your sexual partner. This information is not intended to replace advice given to you by your health care provider. Make sure you discuss any questions you have with your health care provider. Document Revised: 07/08/2019 Document Reviewed: 07/08/2019 Elsevier Patient Education  [place].

## 2020-11-11 NOTE — Progress Notes (Signed)
WELL-WOMAN EXAMINATION & IUD INSERTION Patient name: Emily Morales MRN [id number]  Date of birth: 06/08/1991 Chief Complaint:   Gynecologic Exam and Contraception  History of Present Illness:   Emily Morales is a 30 y.o. G41P2002 African American female being seen today for a routine well-woman exam.  Current complaints: Has PCOS, reports heavy irregular periods, changes saturated ultra tampon and large pad q 2-3hrs, large clots, bad cramps.  Dep/anx, mood swings, cries easily. Has been on multiple meds before, none worked, but states dosages were never increased. Denies SI/HI. Trouble sleeping. When she does fall asleep, wakes up around 0200 wide awak and thoughts won't shut off. Abdominal pain. Constipation- sometimes goes 1-2wks w/o bm. Has been dx w/ IB-C, not on meds.   Depression screen [facility] 2/9 11/11/2020 09/15/2020 06/01/2020 01/05/2020 09/30/2019  Decreased Interest 3 0 0 0 0  Down, Depressed, Hopeless 1 0 0 0 0  PHQ - 2 Score 4 0 0 0 0  Altered sleeping 3 - 3 0 0  Tired, decreased energy 2 - 0 0 0  Change in appetite 3 - 3 0 0  Feeling bad or failure about yourself  0 - 0 0 0  Trouble concentrating 3 - 0 0 0  Moving slowly or fidgety/restless 0 - 0 0 0  Suicidal thoughts 0 - 0 0 0  PHQ-9 Score 15 - 6 0 0  Difficult doing work/chores - - - Not difficult at all Not difficult at all  Some recent data might be hidden   PCP: Just established care w/ NP at [facility] yesterday      does desire labs Patient's last menstrual period was 11/11/2020 (exact date).  Last pap 07/02/18. Results were: NILM w/ HRHPV negative. H/O abnormal pap: yes Last mammogram: never. Results were: N/A. Family h/o breast cancer: yes PGA Last colonoscopy: never. Results were: N/A. Family h/o colorectal cancer: no Review of Systems:   Pertinent items are noted in HPI Denies any headaches, blurred vision, fatigue, shortness of breath, chest pain, abdominal pain, abnormal vaginal  discharge/itching/odor/irritation, problems with periods, bowel movements, urination, or intercourse unless otherwise stated above. Pertinent History Reviewed:  Reviewed past medical,surgical, social and family history.  Reviewed problem list, medications and allergies. Physical Assessment:   Vitals:   11/11/20 0853  BP: 114/76  Pulse: 67  Weight: 221 lb (100.2 kg)  Height: 5' 5.5" (1.664 m)  Body mass index is 36.22 kg/m.        Physical Examination:   General appearance - well appearing, and in no distress  Mental status - alert, oriented to person, place, and time  Psych:  She has a normal mood and affect  Skin - warm and dry, normal color, no suspicious lesions noted  Chest - effort normal, all lung fields clear to auscultation bilaterally  Heart - normal rate and regular rhythm  Neck:  midline trachea, no thyromegaly or nodules  Breasts - breasts appear normal, no suspicious masses, no skin or nipple changes or  axillary nodes  Abdomen - soft, nontender, nondistended, no masses or organomegaly  Pelvic - VULVA: normal appearing vulva with no masses, tenderness or lesions  VAGINA: normal appearing vagina with normal color and discharge, no lesions  CERVIX: normal appearing cervix without discharge or lesions, no CMT  Thin prep pap is done w/ HR HPV cotesting  UTERUS: uterus is felt to be normal size, shape, consistency and nontender   ADNEXA: No adnexal masses or tenderness noted.  Extremities:  No swelling or varicosities noted  IUD INSERTION  The risks and benefits of the method and placement have been thouroughly reviewed with the patient and all questions were answered.  Specifically the patient is aware of failure rate of 07/998, expulsion of the IUD and of possible perforation.  The patient is aware of irregular bleeding due to the method and understands the incidence of irregular bleeding diminishes with time.  Signed copy of informed consent in chart.   Time out was  performed.  A graves speculum was placed in the vagina.  The cervix was visualized, prepped using Betadine, and grasped with a single tooth tenaculum. The uterus was found to be neutral and it sounded to 8 cm.  Liletta  IUD placed per manufacturer's recommendations. The strings were trimmed to approximately 3 cm. The patient tolerated the procedure well.   Informal transabdominal sonogram was performed and the proper placement of the IUD was verified.  Chaperone: Marcelino Scot    Results for orders placed or performed in visit on 11/11/20 (from the past 24 hour(s))  POCT urine pregnancy   Collection Time: 11/11/20  9:05 AM  Result Value Ref Range   Preg Test, Ur Negative Negative    Assessment & Plan:  1) Well-Woman Exam  2) Liletta IUD insertion The patient was given post procedure instructions, including signs and symptoms of infection and to check for the strings after each menses or each month, and refraining from intercourse or anything in the vagina for 3 days. She was given a care card with date IUD placed, and date IUD to be removed. She is scheduled for a f/u appointment in 4 weeks.  3) PCOS w/ heavy irregular periods> Liletta IUD inserted  4) Dep/anx> rx celexa, referral to [facility], f/u 4wks  5) Constipation w/ abdominal pain> gave printed prevention/relief measures   Labs/procedures today: pap, IUD insertion, labs as below  Mammogram: @ 32yo, or sooner if problems Colonoscopy: @ 32yo, or sooner if problems  Orders Placed This Encounter  Procedures  . CBC  . Comprehensive metabolic panel  . TSH  . Hemoglobin A1c  . VITAMIN D 25 Hydroxy (Vit-D Deficiency, Fractures)  . Amb ref to [employer]  . POCT urine pregnancy    Meds:  Meds ordered this encounter  Medications  . citalopram (CELEXA) 20 MG tablet    Sig: Take 10mg  (1/2 tablet) daily x 1 week, then 20mg  (1 tablet) daily thereafter    Dispense:  30 tablet    Refill:  2    Order Specific  Question:   Supervising Provider    Answer:   Elonda Husky, LUTHER H [[id number]]  . levonorgestrel (LILETTA) 19.5 MCG/DAY IUD    Follow-up: Return in about 4 weeks (around 12/09/2020) for IUD f/u, in person, with me.  Roma Schanz CNM, [facility] 11/11/2020 11:11 AM

## 2020-11-12 LAB — COMPREHENSIVE METABOLIC PANEL
ALT: 13 IU/L (ref 0–32)
AST: 16 IU/L (ref 0–40)
Albumin/Globulin Ratio: 1.7 (ref 1.2–2.2)
Albumin: 4.8 g/dL (ref 3.8–4.8)
Alkaline Phosphatase: 90 IU/L (ref 44–121)
BUN/Creatinine Ratio: 12 (ref 9–23)
BUN: 8 mg/dL (ref 6–20)
Bilirubin Total: 0.4 mg/dL (ref 0.0–1.2)
CO2: 18 mmol/L — ABNORMAL LOW (ref 20–29)
Calcium: 9.6 mg/dL (ref 8.7–10.2)
Chloride: 102 mmol/L (ref 96–106)
Creatinine, Ser: 0.65 mg/dL (ref 0.57–1.00)
Globulin, Total: 2.9 g/dL (ref 1.5–4.5)
Glucose: 92 mg/dL (ref 65–99)
Potassium: 4.2 mmol/L (ref 3.5–5.2)
Sodium: 142 mmol/L (ref 134–144)
Total Protein: 7.7 g/dL (ref 6.0–8.5)
eGFR: 121 mL/min/{1.73_m2} (ref >59)

## 2020-11-12 LAB — CBC
Hematocrit: 33.6 % — ABNORMAL LOW (ref 34.0–46.6)
Hemoglobin: 10.8 g/dL — ABNORMAL LOW (ref 11.1–15.9)
MCH: 26.1 pg — ABNORMAL LOW (ref 26.6–33.0)
MCHC: 32.1 g/dL (ref 31.5–35.7)
MCV: 81 fL (ref 79–97)
Platelets: 344 10*3/uL (ref 150–450)
RBC: 4.14 x10E6/uL (ref 3.77–5.28)
RDW: 13.1 % (ref 11.7–15.4)
WBC: 9.3 10*3/uL (ref 3.4–10.8)

## 2020-11-12 LAB — VITAMIN D 25 HYDROXY (VIT D DEFICIENCY, FRACTURES): Vit D, 25-Hydroxy: 20.4 ng/mL — ABNORMAL LOW (ref 30.0–100.0)

## 2020-11-12 LAB — HEMOGLOBIN A1C
Est. average glucose Bld gHb Est-mCnc: 117 mg/dL
Hgb A1c MFr Bld: 5.7 % — ABNORMAL HIGH (ref 4.8–5.6)

## 2020-11-12 LAB — TSH: TSH: 1.53 u[IU]/mL (ref 0.450–4.500)

## 2020-11-15 LAB — CYTOLOGY - PAP
Chlamydia: NEGATIVE
Comment: NEGATIVE
Comment: NEGATIVE
Comment: NORMAL
Diagnosis: NEGATIVE
High risk HPV: NEGATIVE
Neisseria Gonorrhea: NEGATIVE

## 2020-11-15 MED ORDER — FERROUS SULFATE 325 (65 FE) MG PO TABS: 325.0000 mg | ORAL_TABLET | ORAL | 2 refills | Status: DC

## 2020-11-15 NOTE — Addendum Note (Signed)
Addended by: Roma Schanz on: 11/15/2020 01:31 PM   Modules accepted: Orders

## 2020-11-15 NOTE — BH Specialist Note (Signed)
Integrated Behavioral Health via Telemedicine Visit  11/15/2020 Emily Morales [id number]  Number of [place] visits: 1 Session Start time: 2:14  Session End time: 2:39 Total time: 25  Referring Provider: Wells Guiles, CNM Patient/Family location: Work/[facility] Provider location: [facility] for [place] [facility]  All persons participating in visit: [name] and [place]   Types of Service: Individual psychotherapy and Video visit  I connected with Emily Morales and/or Emily Morales's n/a via  Telephone or Video Enabled Telemedicine Application  (Video is Caregility application) and verified that I am speaking with the correct person using two identifiers. Discussed confidentiality: Yes   I discussed the limitations of telemedicine and the availability of in person appointments.  Discussed there is a possibility of technology failure and discussed alternative modes of communication if that failure occurs.  I discussed that engaging in this telemedicine visit, they consent to the provision of behavioral healthcare and the services will be billed under their insurance.  Patient and/or legal guardian expressed understanding and consented to Telemedicine visit: Yes   Presenting Concerns: Patient and/or family reports the following symptoms/concerns: Pt states her primary concern today is increase in anxiety, depression, fatigue, difficulty staying asleep (less than 5 hours/night), and overwhelmed with increased workload and little time for self-care; pt started taking Celexa, and tried to exercise, but little time or energy; open to learning and implementing self-coping strategies today.  Duration of problem: Increase over time; Severity of problem: moderately severe  Patient and/or Family's Strengths/Protective Factors: Concrete supports in place (healthy food, safe environments, etc.) and  Sense of purpose  Goals Addressed: Patient will: 1.  Reduce symptoms of: anxiety, depression, insomnia and stress  2.  Increase knowledge and/or ability of: healthy habits and self-management skills  3.  Demonstrate ability to: Increase healthy adjustment to current life circumstances  Progress towards Goals: Ongoing  Interventions: Interventions utilized:  Mindfulness or Psychologist, educational and Psychoeducation and/or Health Education Standardized Assessments completed: Not given today  Patient and/or Family Response: Pt agrees with treatment plan  Assessment: Patient currently experiencing Adjustment disorder with mixed anxious and depressed mood.   Patient may benefit from psychoeducation and brief therapeutic interventions regarding coping with symptoms of anxiety, depression, stress .  Plan: 1. Follow up with behavioral health clinician on : Two weeks; Call Roselyn Reef as needed, prior to scheduled visit, at [phone] 2. Behavioral recommendations:  -CALM relaxation breathing exercise twice daily (morning; at bedtime with fan on) -Begin Worry Time strategy; practice daily for two weeks (Set timer for beginning and end time, no more than 15 minutes daily)  3. Referral(s): [place] (In Clinic)  I discussed the assessment and treatment plan with the patient and/or parent/guardian. They were provided an opportunity to ask questions and all were answered. They agreed with the plan and demonstrated an understanding of the instructions.   They were advised to call back or seek an in-person evaluation if the symptoms worsen or if the condition fails to improve as anticipated.  Garlan Fair, LCSW   Depression screen [facility] 2/9 11/11/2020 09/15/2020 06/01/2020 01/05/2020 09/30/2019  Decreased Interest 3 0 0 0 0  Down, Depressed, Hopeless 1 0 0 0 0  PHQ - 2 Score 4 0 0 0 0  Altered sleeping 3 - 3 0 0  Tired, decreased energy 2 - 0 0 0  Change in appetite 3 - 3  0 0  Feeling bad or  failure about yourself  0 - 0 0 0  Trouble concentrating 3 - 0 0 0  Moving slowly or fidgety/restless 0 - 0 0 0  Suicidal thoughts 0 - 0 0 0  PHQ-9 Score 15 - 6 0 0  Difficult doing work/chores - - - Not difficult at all Not difficult at all  Some recent data might be hidden   GAD 7 : Generalized Anxiety Score 11/11/2020 09/15/2020 12/13/2018 08/15/2018  Nervous, Anxious, on Edge 3 0 0 3  Control/stop worrying 3 0 3 1  Worry too much - different things 3 0 3 2  Trouble relaxing 3 0 2 3  Restless 3 0 0 1  Easily annoyed or irritable 3 0 3 3  Afraid - awful might happen 1 0 1 2  Total GAD 7 Score 19 0 12 15  Anxiety Difficulty - Not difficult at all Very difficult -

## 2020-11-24 ENCOUNTER — Ambulatory Visit (INDEPENDENT_AMBULATORY_CARE_PROVIDER_SITE_OTHER): Payer: No Typology Code available for payment source | Admitting: Clinical

## 2020-11-24 DIAGNOSIS — F4323 Adjustment disorder with mixed anxiety and depressed mood: Secondary | ICD-10-CM

## 2020-11-24 DIAGNOSIS — F418 Other specified anxiety disorders: Secondary | ICD-10-CM | POA: Diagnosis not present

## 2020-11-24 NOTE — Patient Instructions (Addendum)
[facility] [address] [place], [place] [address] [phone] (main office) [phone] (Kaely Hollan's office)   

## 2020-11-25 NOTE — BH Specialist Note (Signed)
Integrated Behavioral Health via Telemedicine Visit  11/25/2020 Emily Morales [id number]  Number of [place] visits: 2 Session Start time: 8:19  Session End time: 8:28 Total time: 9  Referring Provider: Wells Guiles, CNM Patient/Family location: [facility] Provider location: Center for [employer] at [facility]  All persons participating in visit: [name] and [name]   Types of Service: Individual psychotherapy and Video visit  I connected with Emily Morales and/or Emily Morales's n/a via  Telephone or Video Enabled Telemedicine Application  (Video is Caregility application) and verified that I am speaking with the correct person using two identifiers. Discussed confidentiality: At previous visit  I discussed the limitations of telemedicine and the availability of in person appointments.  Discussed there is a possibility of technology failure and discussed alternative modes of communication if that failure occurs.  I discussed that engaging in this telemedicine visit, they consent to the provision of behavioral healthcare and the services will be billed under their insurance.  Patient and/or legal guardian expressed understanding and consented to Telemedicine visit: Yes   Presenting Concerns: Patient and/or family reports the following symptoms/concerns: Pt states her primary concern today is an increase in panic attacks (2 last week), attributed to additional caretaking of MIL (post-MIL-surgery), sleep deprivation, adding to already-increased workload at work. Pt is taking Celexa as prescribed, and implementing self-coping strategies effectively until caretaking began.  Duration of problem: Increase in past two weeks; Severity of problem: moderately severe  Patient and/or Family's Strengths/Protective Factors: Concrete supports in place (healthy food, safe environments, etc.) and  Sense of purpose  Goals Addressed: Patient will: 1.  Reduce symptoms of: anxiety, depression, insomnia and stress  2.  Demonstrate ability to: Increase healthy adjustment to current life circumstances  Progress towards Goals: Ongoing  Interventions: Interventions utilized:  Medication Monitoring and Supportive Counseling Standardized Assessments completed: Not Needed  Patient and/or Family Response: Pt agrees with treatment plan  Assessment: Patient currently experiencing Adjustment disorder with mixed anxious and depressed mood.   Patient may benefit from continued psychoeducation and brief therapeutic interventions regarding coping with symptoms of anxiety, depression, stress, insomnia .  Plan: 1. Follow up with behavioral health clinician on : Two weeks 2. Behavioral recommendations:  -Continue taking BH medication as prescribed -Continue implementing self-coping strategies daily, as schedule allows (breathing exercise, worry time stragety) 3. Referral(s): [place] (In Clinic)  I discussed the assessment and treatment plan with the patient and/or parent/guardian. They were provided an opportunity to ask questions and all were answered. They agreed with the plan and demonstrated an understanding of the instructions.   They were advised to call back or seek an in-person evaluation if the symptoms worsen or if the condition fails to improve as anticipated.  Caroleen Hamman Avner Stroder, LCSW

## 2020-12-09 ENCOUNTER — Ambulatory Visit (INDEPENDENT_AMBULATORY_CARE_PROVIDER_SITE_OTHER): Payer: No Typology Code available for payment source | Admitting: Clinical

## 2020-12-09 ENCOUNTER — Other Ambulatory Visit: Payer: Self-pay | Admitting: Women's Health

## 2020-12-09 DIAGNOSIS — F4323 Adjustment disorder with mixed anxiety and depressed mood: Secondary | ICD-10-CM

## 2020-12-09 MED ORDER — HYDROXYZINE HCL 25 MG PO TABS: 25.0000 mg | ORAL_TABLET | Freq: Three times a day (TID) | ORAL | 0 refills | Status: DC | PRN

## 2020-12-10 ENCOUNTER — Ambulatory Visit: Payer: No Typology Code available for payment source | Admitting: Women's Health

## 2020-12-10 NOTE — BH Specialist Note (Signed)
Pt did not arrive to video visit and did not answer the phone ; Voicemail is full, so unable to leave voice message; left MyChart message for patient.

## 2020-12-22 ENCOUNTER — Ambulatory Visit (INDEPENDENT_AMBULATORY_CARE_PROVIDER_SITE_OTHER): Payer: Self-pay | Admitting: Advanced Practice Midwife

## 2020-12-22 ENCOUNTER — Other Ambulatory Visit: Payer: Self-pay

## 2020-12-22 ENCOUNTER — Encounter: Payer: Self-pay | Admitting: Advanced Practice Midwife

## 2020-12-22 VITALS — BP 114/81 | HR 81 | Ht 65.5 in | Wt 222.5 lb

## 2020-12-22 DIAGNOSIS — Z30431 Encounter for routine checking of intrauterine contraceptive device: Secondary | ICD-10-CM

## 2020-12-22 NOTE — Progress Notes (Signed)
   GYN VISIT Patient name: Emily Morales MRN [id number]  Date of birth: [birth year] Chief Complaint:   IUD check  History of Present Illness:   Emily Morales is a 30 y.o. G59P2002 African American female being seen today for IUD f/u. She reports some abd discomfort, R sided discomfort, and low mid back pain. Of note, reports having IBS-C and having recent imaging showing a significant stool-burden. Has had low back pain off and on previously. No dysuria and had a r/o UTI at another doc visit. Reports intermittent spotting.  Depression screen [facility] 2/9 11/11/2020 09/15/2020 06/01/2020 01/05/2020 09/30/2019  Decreased Interest 3 0 0 0 0  Down, Depressed, Hopeless 1 0 0 0 0  PHQ - 2 Score 4 0 0 0 0  Altered sleeping 3 - 3 0 0  Tired, decreased energy 2 - 0 0 0  Change in appetite 3 - 3 0 0  Feeling bad or failure about yourself  0 - 0 0 0  Trouble concentrating 3 - 0 0 0  Moving slowly or fidgety/restless 0 - 0 0 0  Suicidal thoughts 0 - 0 0 0  PHQ-9 Score 15 - 6 0 0  Difficult doing work/chores - - - Not difficult at all Not difficult at all  Some recent data might be hidden    No LMP recorded (exact date). (Menstrual status: IUD). The current method of family planning is IUD.  Last pap April 2022. Results were: NILM w/ HRHPV negative Review of Systems:   Pertinent items are noted in HPI Denies fever/chills, dizziness, headaches, visual disturbances, fatigue, shortness of breath, chest pain, abdominal pain, vomiting, abnormal vaginal discharge/itching/odor/irritation, problems with periods, bowel movements, urination, or intercourse unless otherwise stated above.  Pertinent History Reviewed:  Reviewed past medical,surgical, social, obstetrical and family history.  Reviewed problem list, medications and allergies. Physical Assessment:   Vitals:   12/22/20 1615  BP: 114/81  Pulse: 81  Weight: 222 lb 8 oz (100.9 kg)  Height: 5' 5.5" (1.664 m)  Body mass index is 36.46 kg/m.        Physical Examination:   General appearance: alert, well appearing, and in no distress  Mental status: alert, oriented to person, place, and time  Skin: warm & dry   Cardiovascular: normal heart rate noted  Respiratory: normal respiratory effort, no distress  Abdomen: soft, non-tender; bedside u/s= IUD in fundus   Pelvic: normal external genitalia, vulva, vagina, cervix, uterus and adnexa; strings present approx 2-3cm from os, brown spotting noted  Extremities: no edema   Chaperone: Angel Neas    No results found for this or any previous visit (from the past 24 hour(s)).  Assessment & Plan:  1) IUD placement> verified by strings and bedside u/s  2) Abd/back discomfort> has significant constipation per pt (had imaging); rec enema to relieve this; PCP for low back pain  Meds: No orders of the defined types were placed in this encounter.   No orders of the defined types were placed in this encounter.   Return in about 1 year (around 12/22/2021) for Physical.  Myrtis Ser [facility] 12/22/2020 4:53 PM

## 2020-12-23 ENCOUNTER — Ambulatory Visit: Payer: Self-pay | Admitting: Clinical

## 2020-12-23 DIAGNOSIS — Z91199 Patient's noncompliance with other medical treatment and regimen due to unspecified reason: Secondary | ICD-10-CM

## 2020-12-23 DIAGNOSIS — Z5329 Procedure and treatment not carried out because of patient's decision for other reasons: Secondary | ICD-10-CM

## 2021-03-20 ENCOUNTER — Telehealth: Payer: Self-pay | Admitting: Family

## 2021-03-20 DIAGNOSIS — K0889 Other specified disorders of teeth and supporting structures: Secondary | ICD-10-CM

## 2021-03-21 MED ORDER — NAPROXEN 500 MG PO TABS: 500.0000 mg | ORAL_TABLET | Freq: Two times a day (BID) | ORAL | 0 refills | Status: DC

## 2021-03-21 MED ORDER — AMOXICILLIN-POT CLAVULANATE 875-125 MG PO TABS: 1.0000 | ORAL_TABLET | Freq: Two times a day (BID) | ORAL | 0 refills | Status: DC

## 2021-03-21 NOTE — Progress Notes (Signed)

## 2021-05-13 ENCOUNTER — Ambulatory Visit (INDEPENDENT_AMBULATORY_CARE_PROVIDER_SITE_OTHER): Payer: Self-pay | Admitting: Adult Health

## 2021-05-13 ENCOUNTER — Other Ambulatory Visit: Payer: Self-pay

## 2021-05-13 ENCOUNTER — Encounter: Payer: Self-pay | Admitting: Adult Health

## 2021-05-13 VITALS — BP 115/73 | HR 71 | Ht 66.5 in | Wt 228.8 lb

## 2021-05-13 DIAGNOSIS — N920 Excessive and frequent menstruation with regular cycle: Secondary | ICD-10-CM | POA: Insufficient documentation

## 2021-05-13 DIAGNOSIS — Z975 Presence of (intrauterine) contraceptive device: Secondary | ICD-10-CM | POA: Insufficient documentation

## 2021-05-13 DIAGNOSIS — N946 Dysmenorrhea, unspecified: Secondary | ICD-10-CM

## 2021-05-13 LAB — POCT HEMOGLOBIN: Hemoglobin: 10.6 g/dL — AB (ref 11–14.6)

## 2021-05-13 MED ORDER — FUSION PLUS PO CAPS: ORAL_CAPSULE | ORAL | 0 refills | Status: DC

## 2021-05-13 MED ORDER — MEGESTROL ACETATE 40 MG PO TABS: ORAL_TABLET | ORAL | 1 refills | Status: DC

## 2021-05-13 NOTE — Progress Notes (Signed)
  Subjective:     Patient ID: ARLISS HEPBURN, female   DOB: 02-07-1991, 30 y.o.   MRN: [id number]  HPI Dorice is a 30 year old black female, married, G2P2 in complaining of heavy painful periods, she had liletta placed 5 months ago and periods, may be a little worse, may change pad every 1-2 hours. Lab Results  Component Value Date   DIAGPAP  11/11/2020    - Negative for intraepithelial lesion or malignancy (NILM)   [place] Negative 11/11/2020     Review of Systems +heavy painful periods +fatigue  Reviewed past medical,surgical, social and family history. Reviewed medications and allergies.     Objective:   Physical Exam BP 115/73 (BP Location: Right Arm, Patient Position: Sitting, Cuff Size: Large)   Pulse 71   Ht 5' 6.5" (1.689 m)   Wt 228 lb 12.8 oz (103.8 kg)   LMP 05/07/2021 (Exact Date)   BMI 36.38 kg/m   Skin warm and dry.Pelvic: external genitalia is normal in appearance no lesions, vagina: +blood,urethra has no lesions or masses noted, cervix:smooth and bulbous,No IUD strings seen, POC US shows IUD in place and cyst on ovary, uterus: normal size, shape and contour, mildly tender, no masses felt, adnexa: no masses or tenderness noted. Bladder is non tender and no masses felt.  POC HGB 10.6  Upstream - 05/13/21 1221       Pregnancy Intention Screening   Does the patient want to become pregnant in the next year? No    Does the patient's partner want to become pregnant in the next year? No    Would the patient like to discuss contraceptive options today? Yes      Contraception Wrap Up   Current Method IUD or IUS    End Method IUD or IUS    Contraception Counseling Provided Yes               Examination chaperoned by Levy Pupa LPN  Assessment:     1. Menorrhagia with regular cycle Will rx megace Will give samples Fusion plus 1 daily Meds ordered this encounter  Medications   megestrol (MEGACE) 40 MG tablet    Sig: Take 3 x 5 days then 2 daily x 5 days  then 1 daily    Dispense:  45 tablet    Refill:  1    Order Specific Question:   Supervising Provider    Answer:   Elonda Husky, LUTHER H [[id number]]   Iron-FA-B Cmp-C-Biot-Probiotic (FUSION PLUS) CAPS    Sig: Take 1 daily    Dispense:  44 capsule    Refill:  0    Order Specific Question:   Supervising Provider    Answer:   Elonda Husky, LUTHER H [[id number]]     2. Dysmenorrhea   3. IUD (intrauterine device) in place     Plan:     Pt to call me in about 2-3 weeks in follow up and check CBC at work before that

## 2021-05-18 ENCOUNTER — Other Ambulatory Visit: Payer: Self-pay

## 2021-05-19 MED ORDER — HYDROXYZINE HCL 25 MG PO TABS: 25.0000 mg | ORAL_TABLET | Freq: Three times a day (TID) | ORAL | 0 refills | Status: DC | PRN

## 2021-05-24 ENCOUNTER — Telehealth: Payer: Self-pay | Admitting: Physician Assistant

## 2021-05-24 DIAGNOSIS — R6889 Other general symptoms and signs: Secondary | ICD-10-CM

## 2021-05-24 NOTE — Progress Notes (Addendum)
E visit for Flu like symptoms   We are sorry that you are not feeling well.  Here is how we plan to help! Based on what you have shared with me it looks like you may have flu-like symptoms that should be watched but do not seem to indicate anti-viral treatment.  Influenza or "the flu" is   an infection caused by a respiratory virus. The flu virus is highly contagious and persons who did not receive their yearly flu vaccination may "catch" the flu from close contact.  We have anti-viral medications to treat the viruses that cause this infection. They are not a "cure" and only shorten the course of the infection. These prescriptions are most effective when they are given within the first 2 days of "flu" symptoms. Antiviral medication are indicated if you have a high risk of complications from the flu. You should  also consider an antiviral medication if you are in close contact with someone who is at risk. These medications can help patients avoid complications from the flu  but have side effects that you should know. Possible side effects from Tamiflu or oseltamivir include nausea, vomiting, diarrhea, dizziness, headaches, eye redness, sleep problems or other respiratory symptoms. You should not take Tamiflu if you have an allergy to oseltamivir or any to the ingredients in Tamiflu.  Based upon your symptoms and potential risk factors I recommend that you follow the flu symptoms recommendation that I have listed below.  I recommend you alternate tylenol and ibuprofen OTC for aches. Keep well-hydrated and get plenty of rest. Start some OTC Pepto Bismol for stomach symptoms.  Consider starting OTC Vitamin D3 1000 units daily and Vitamin C 1000 mg daily to help immune system out.   ANYONE WHO HAS FLU SYMPTOMS SHOULD: Stay home. The flu is highly contagious and going out or to work exposes others! Be sure to drink plenty of fluids. Water is fine as well as fruit juices, sodas and electrolyte beverages.  You may want to stay away from caffeine or alcohol. If you are nauseated, try taking small sips of liquids. How do you know if you are getting enough fluid? Your urine should be a pale yellow or almost colorless. Get rest. Taking a steamy shower or using a humidifier may help nasal congestion and ease sore throat pain. Using a saline nasal spray works much the same way. Cough drops, hard candies and sore throat lozenges may ease your cough. Line up a caregiver. Have someone check on you regularly.   GET HELP RIGHT AWAY IF: You cannot keep down liquids or your medications. You become short of breath Your fell like you are going to pass out or loose consciousness. Your symptoms persist after you have completed your treatment plan MAKE SURE YOU  Understand these instructions. Will watch your condition. Will get help right away if you are not doing well or get worse.  Your e-visit answers were reviewed by a board certified advanced clinical practitioner to complete your personal care plan.  Depending on the condition, your plan could have included both over the counter or prescription medications.  If there is a problem please reply  once you have received a response from your provider.  Your safety is important to [place].  If you have drug allergies check your prescription carefully.    You can use MyChart to ask questions about today's visit, request a non-urgent call back, or ask for a work or school excuse for 24 hours related to  this e-Visit. If it has been greater than 24 hours you will need to follow up with your provider, or enter a new e-Visit to address those concerns.  You will get an e-mail in the next two days asking about your experience.  I hope that your e-visit has been valuable and will speed your recovery. Thank you for using e-visits.

## 2021-05-24 NOTE — Progress Notes (Signed)
I have spent 5 minutes in review of e-visit questionnaire, review and updating patient chart, medical decision making and response to patient.   Emily Brazeau Cody Emilyn Ruble, PA-C    

## 2021-06-21 ENCOUNTER — Telehealth: Payer: Self-pay | Admitting: Physician Assistant

## 2021-06-21 DIAGNOSIS — L700 Acne vulgaris: Secondary | ICD-10-CM

## 2021-06-21 MED ORDER — CLINDAMYCIN PHOSPHATE 1 % EX LOTN: TOPICAL_LOTION | Freq: Two times a day (BID) | CUTANEOUS | 0 refills | Status: DC

## 2021-06-21 NOTE — Progress Notes (Signed)
We are sorry that you are experiencing this issue.  Here is how we plan to help!  Based on what you shared with me it looks like you have cystic acne.  Acne is a disorder of the hair follicles and oil glands (sebaceous glands). The sebaceous glands secrete oils to keep the skin moist.  When the glands get clogged, it can lead to pimples or cysts.  These cysts may become infected and leave scars. Acne is very common and normally occurs at puberty.  Acne is also inherited.  Your personal care plan consists of the following recommendations:  I recommend that you use a daily cleanser  You may try a topical exfoliator and salicylic acid scrub.  These scrubs have coarse particles that clear your pores but may also irritate your skin.  I have prescribed a topical gel with an antibiotic:  Clindamycin 1% lotion.  Apply the lotion to the affected skin twice daily.  Be sure to read the package insert to understand potential side effects,   If excessive dryness or peeling occurs, reduce dose frequency or concentration of the topical scrubs.  If excessive stinging or burning occurs, remove the topical gel with mild soap and water and resume at a lower dose the next day.  Remember oral antibiotics and topical acne treatments may increase your sensitivity to the sun!  HOME CARE: Do not squeeze pimples because that can often lead to infections, worse acne, and scars. Use a moisturizer that contains retinoid or fruit acids that may inhibit the development of new acne lesions. Although there is not a clear link that foods can cause acne, doctors do believe that too many sweets predispose you to skin problems.  GET HELP RIGHT AWAY IF: If your acne gets worse or is not better within 10 days. If you become depressed. If you become pregnant, discontinue medications and call your OB/GYN.  MAKE SURE YOU: Understand these instructions. Will watch your condition. Will get help right away if you are not doing  well or get worse.  Thank you for choosing an e-visit.  Your e-visit answers were reviewed by a board certified advanced clinical practitioner to complete your personal care plan. Depending upon the condition, your plan could have included both over the counter or prescription medications.  Please review your pharmacy choice. Make sure the pharmacy is open so you can pick up prescription now. If there is a problem, you may contact your provider through [employer] and have the prescription routed to another pharmacy.  Your safety is important to [place]. If you have drug allergies check your prescription carefully.   For the next 24 hours you can use MyChart to ask questions about today's visit, request a non-urgent call back, or ask for a work or school excuse. You will get an email in the next two days asking about your experience. I hope that your e-visit has been valuable and will speed your recovery.   I provided 7 minutes of non face-to-face time during this encounter for chart review and documentation.

## 2021-07-21 ENCOUNTER — Other Ambulatory Visit: Payer: Self-pay | Admitting: Physician Assistant

## 2021-07-21 DIAGNOSIS — L700 Acne vulgaris: Secondary | ICD-10-CM

## 2021-07-26 ENCOUNTER — Telehealth: Payer: Self-pay | Admitting: Nurse Practitioner

## 2021-07-26 DIAGNOSIS — J069 Acute upper respiratory infection, unspecified: Secondary | ICD-10-CM

## 2021-07-26 MED ORDER — BENZONATATE 100 MG PO CAPS: 100.0000 mg | ORAL_CAPSULE | Freq: Three times a day (TID) | ORAL | 0 refills | Status: DC | PRN

## 2021-07-26 MED ORDER — FLUTICASONE PROPIONATE 50 MCG/ACT NA SUSP: 2.0000 | Freq: Every day | NASAL | 0 refills | Status: DC

## 2021-07-26 NOTE — Progress Notes (Signed)
E-Visit for Upper Respiratory Infection   We are sorry you are not feeling well.  Here is how we plan to help!  Based on what you have shared with me, it looks like you may have a viral upper respiratory infection.  Upper respiratory infections are caused by a large number of viruses; however, rhinovirus is the most common cause.   Providers prescribe antibiotics to treat infections caused by bacteria. Antibiotics are very powerful in treating bacterial infections when they are used properly. To maintain their effectiveness, they should be used only when necessary. Overuse of antibiotics has resulted in the development of superbugs that are resistant to treatment!    After careful review of your answers, I would not recommend an antibiotic for your condition.  Antibiotics are not effective against viruses and therefore should not be used to treat them. Common examples of infections caused by viruses include colds and flu   Symptoms vary from person to person, with common symptoms including sore throat, cough, fatigue or lack of energy and feeling of general discomfort.  A low-grade fever of up to 100.4 may present, but is often uncommon.  Symptoms vary however, and are closely related to a person's age or underlying illnesses.  The most common symptoms associated with an upper respiratory infection are nasal discharge or congestion, cough, sneezing, headache and pressure in the ears and face.  These symptoms usually persist for about 3 to 10 days, but can last up to 2 weeks.  It is important to know that upper respiratory infections do not cause serious illness or complications in most cases.    Upper respiratory infections can be transmitted from person to person, with the most common method of transmission being a person's hands.  The virus is able to live on the skin and can infect other persons for up to 2 hours after direct contact.  Also, these can be transmitted when someone coughs or sneezes;  thus, it is important to cover the mouth to reduce this risk.  To keep the spread of the illness at [place], good hand hygiene is very important.  This is an infection that is most likely caused by a virus. There are no specific treatments other than to help you with the symptoms until the infection runs its course.  We are sorry you are not feeling well.  Here is how we plan to help!   For nasal congestion, you may use an oral decongestants such as Mucinex D or if you have glaucoma or high blood pressure use plain Mucinex.  Saline nasal spray or nasal drops can help and can safely be used as often as needed for congestion.  For your congestion, I have prescribed Fluticasone nasal spray one spray in each nostril twice a day  If you do not have a history of heart disease, hypertension, diabetes or thyroid disease, prostate/bladder issues or glaucoma, you may also use Sudafed to treat nasal congestion.  It is highly recommended that you consult with a pharmacist or your primary care physician to ensure this medication is safe for you to take.     If you have a cough, you may use cough suppressants such as Delsym and Robitussin.  If you have glaucoma or high blood pressure, you can also use Coricidin HBP.   For cough I have prescribed for you A prescription cough medication called Tessalon Perles 100 mg. You may take 1-2 capsules every 8 hours as needed for cough  If you have a  sore or scratchy throat, use a saltwater gargle-  to  teaspoon of salt dissolved in a 4-ounce to 8-ounce glass of warm water.  Gargle the solution for approximately 15-30 seconds and then spit.  It is important not to swallow the solution.  You can also use throat lozenges/cough drops and Chloraseptic spray to help with throat pain or discomfort.  Warm or cold liquids can also be helpful in relieving throat pain.  For headache, pain or general discomfort, you can use Ibuprofen or Tylenol as directed.   Some authorities believe that  zinc sprays or the use of Echinacea may shorten the course of your symptoms.   HOME CARE Only take medications as instructed by your medical team. Be sure to drink plenty of fluids. Water is fine as well as fruit juices, sodas and electrolyte beverages. You may want to stay away from caffeine or alcohol. If you are nauseated, try taking small sips of liquids. How do you know if you are getting enough fluid? Your urine should be a pale yellow or almost colorless. Get rest. Taking a steamy shower or using a humidifier may help nasal congestion and ease sore throat pain. You can place a towel over your head and breathe in the steam from hot water coming from a faucet. Using a saline nasal spray works much the same way. Cough drops, hard candies and sore throat lozenges may ease your cough. Avoid close contacts especially the very young and the elderly Cover your mouth if you cough or sneeze Always remember to wash your hands.   GET HELP RIGHT AWAY IF: You develop worsening fever. If your symptoms do not improve within 10 days You develop yellow or green discharge from your nose over 3 days. You have coughing fits You develop a severe head ache or visual changes. You develop shortness of breath, difficulty breathing or start having chest pain Your symptoms persist after you have completed your treatment plan  MAKE SURE YOU  Understand these instructions. Will watch your condition. Will get help right away if you are not doing well or get worse.  Thank you for choosing an e-visit.  Your e-visit answers were reviewed by a board certified advanced clinical practitioner to complete your personal care plan. Depending upon the condition, your plan could have included both over the counter or prescription medications.  Please review your pharmacy choice. Make sure the pharmacy is open so you can pick up prescription now. If there is a problem, you may contact your provider through [employer] and have the prescription routed to another pharmacy.  Your safety is important to [place]. If you have drug allergies check your prescription carefully.   For the next 24 hours you can use MyChart to ask questions about today's visit, request a non-urgent call back, or ask for a work or school excuse. You will get an email in the next two days asking about your experience. I hope that your e-visit has been valuable and will speed your recovery.  I spent approximately 5 minutes reviewing the patient's history, current symptoms and coordinating their plan of care today.    Meds ordered this encounter  Medications   fluticasone (FLONASE) 50 MCG/ACT nasal spray    Sig: Place 2 sprays into both nostrils daily.    Dispense:  16 g    Refill:  0   benzonatate (TESSALON) 100 MG capsule    Sig: Take 1 capsule (100 mg total) by mouth 3 (three) times daily  as needed.    Dispense:  30 capsule    Refill:  0

## 2021-08-04 ENCOUNTER — Ambulatory Visit: Payer: 59 | Admitting: Adult Health

## 2021-08-04 ENCOUNTER — Encounter: Payer: Self-pay | Admitting: Adult Health

## 2021-08-04 ENCOUNTER — Other Ambulatory Visit: Payer: Self-pay

## 2021-08-04 VITALS — BP 110/71 | HR 74 | Ht 65.0 in | Wt 219.6 lb

## 2021-08-04 DIAGNOSIS — N920 Excessive and frequent menstruation with regular cycle: Secondary | ICD-10-CM

## 2021-08-04 DIAGNOSIS — Z975 Presence of (intrauterine) contraceptive device: Secondary | ICD-10-CM

## 2021-08-04 MED ORDER — MEGESTROL ACETATE 40 MG PO TABS: ORAL_TABLET | ORAL | 1 refills | Status: DC

## 2021-08-04 NOTE — Progress Notes (Signed)
°  Subjective:     Patient ID: Emily Morales, female   DOB: Sep 03, 1990, 31 y.o.   MRN: [id number]  HPI Laniqua is a 31 year old black female,married, G2P2 back in follow up on periods, megace has helped some, has bleed from 07/18/21, but only taking one will increase to 2 daily and she had HGB checked 05/30/21 and was 11.2, still taking iron. Has known small fibroid.   Lab Results  Component Value Date   DIAGPAP  11/11/2020    - Negative for intraepithelial lesion or malignancy (NILM)   [place] Negative 11/11/2020    PCP is Tamela Gammon NP  Review of Systems Still bleeding Not sleeping well  Reviewed past medical,surgical, social and family history. Reviewed medications and allergies.     Objective:   Physical Exam BP 110/71 (BP Location: Right Arm, Patient Position: Sitting, Cuff Size: Normal)    Pulse 74    Ht 5\' 5"  (1.651 m)    Wt 219 lb 9.6 oz (99.6 kg)    LMP 07/18/2021 (Approximate)    BMI 36.54 kg/m     Skin warm and dry.  Lungs: clear to ausculation bilaterally. Cardiovascular: regular rate and rhythm.   [facility] - 08/04/21 1111       Pregnancy Intention Screening   Does the patient want to become pregnant in the next year? No    Does the patient's partner want to become pregnant in the next year? No    Would the patient like to discuss contraceptive options today? No      Contraception Wrap Up   Current Method IUD or IUS    End Method IUD or IUS    Contraception Counseling Provided No             Assessment:     1. Menorrhagia with regular cycle Will increase megace to 2 daily Meds ordered this encounter  Medications   megestrol (MEGACE) 40 MG tablet    Sig: Take 2 daily    Dispense:  60 tablet    Refill:  1    Order Specific Question:   Supervising Provider    Answer:   Florian Buff [[id number]]    Gave handout on endometrial ablation  2. IUD (intrauterine device) in place     Plan:     Return in 1 week for pre op with Dr Elonda Husky for ablation  and tubal

## 2021-08-11 ENCOUNTER — Other Ambulatory Visit: Payer: Self-pay

## 2021-08-11 ENCOUNTER — Encounter: Payer: Self-pay | Admitting: Obstetrics & Gynecology

## 2021-08-11 ENCOUNTER — Ambulatory Visit (INDEPENDENT_AMBULATORY_CARE_PROVIDER_SITE_OTHER): Payer: 59 | Admitting: Obstetrics & Gynecology

## 2021-08-11 VITALS — BP 115/78 | HR 73 | Ht 65.0 in | Wt 217.0 lb

## 2021-08-11 DIAGNOSIS — N946 Dysmenorrhea, unspecified: Secondary | ICD-10-CM

## 2021-08-11 DIAGNOSIS — Z3009 Encounter for other general counseling and advice on contraception: Secondary | ICD-10-CM

## 2021-08-11 DIAGNOSIS — N921 Excessive and frequent menstruation with irregular cycle: Secondary | ICD-10-CM | POA: Diagnosis not present

## 2021-08-11 NOTE — Progress Notes (Signed)
Chief Complaint  Patient presents with   Pre-op Exam    Wants tubal and ablation      31 y.o. [id number] Patient's last menstrual period was 07/18/2021 (approximate). The current method of family planning is IUD.  Outpatient Encounter Medications as of 08/11/2021  Medication Sig   albuterol (VENTOLIN HFA) 108 (90 Base) MCG/ACT inhaler Inhale 2 puffs into the lungs every 6 (six) hours as needed for wheezing or shortness of breath.   clindamycin (CLEOCIN T) 1 % lotion Apply topically 2 (two) times daily.   EPINEPHrine 0.3 mg/0.3 mL IJ SOAJ injection    Fluoxetine HCl, PMDD, 20 MG TABS    Iron-FA-B Cmp-C-Biot-Probiotic (FUSION PLUS) CAPS Take 1 daily   LORazepam (ATIVAN) 0.5 MG tablet Take 0.5 mg by mouth 3 (three) times daily as needed.   megestrol (MEGACE) 40 MG tablet Take 2 daily   hydrOXYzine (ATARAX/VISTARIL) 25 MG tablet Take 1 tablet (25 mg total) by mouth 3 (three) times daily as needed for anxiety. (Patient not taking: Reported on 08/11/2021)   [DISCONTINUED] temazepam (RESTORIL) 15 MG capsule Take 1 capsule (15 mg total) by mouth at bedtime as needed for sleep.   No facility-administered encounter medications on file as of 08/11/2021.    Subjective Long history of menstrual issues, heavy, clots, heavy cramps, long irregular, not responsive to IUD + megestrol Also desires permanent sterilization No dyspareunia No pain if is not bleeding Past Medical History:  Diagnosis Date   Abnormal Pap smear    Anemia    Anxiety    Arthritis    Chronic back pain    Depression    Dysrhythmia    irregular heartrate   GERD (gastroesophageal reflux disease)    Headache(784.0)    Hx of chlamydia infection    Ovarian cyst, right    PCOS (polycystic ovarian syndrome)    Perimenopausal    Uterine fibroid     Past Surgical History:  Procedure Laterality Date   BIOPSY  11/14/2016   Procedure: BIOPSY;  Surgeon: Danie Binder, MD;  Location: [facility];  Service: Endoscopy;;   duodenal gastric   COLPOSCOPY     ESOPHAGOGASTRODUODENOSCOPY (EGD) WITH PROPOFOL N/A 11/14/2016   Dr. Oneida Alar: Mild gastritis, normal esophagus, biopsy showed inflammation of the small bowel, gastric biopsies with no H. pylori.   lipoma surgery     MASS EXCISION N/A 02/28/2017   Procedure: EXCISION LIPOMA SCALP 3CM;  Surgeon: Aviva Signs, MD;  Location: [facility];  Service: General;  Laterality: N/A;   UMBILICAL HERNIA REPAIR N/A 04/04/2019   Procedure: HERNIA REPAIR UMBILICAL ADULT WITH MESH;  Surgeon: Virl Cagey, MD;  Location: [facility];  Service: General;  Laterality: N/A;    OB History     Gravida  2   Para  2   Term  2   Preterm  0   AB  0   Living  2      SAB  0   IAB  0   Ectopic  0   Multiple  0   Live Births  2           Allergies  Allergen Reactions   Other Anaphylaxis, Palpitations and Rash    Water chestnut   Cat Hair Extract Cough   Cattle Epithelium Cough and Itching   Dog Fennel Cough   Hctz [Hydrochlorothiazide] Itching   Meat [Alpha-Gal] Hives    Red Meat   Mixed Feathers Cough and Itching  Mixed Grasses Itching    trees   Pollen Extract Cough   Tree Extract Itching   Latex Rash    Social History   Socioeconomic History   Marital status: Married    Spouse name: Not on file   Number of children: 2   Years of education: Not on file   Highest education level: Not on file  Occupational History   Occupation: clinical care coordinator    Comment: [employer]  Tobacco Use   Smoking status: Never   Smokeless tobacco: Never  Vaping Use   Vaping Use: Never used  Substance and Sexual Activity   Alcohol use: Yes    Comment: occassional    Drug use: Not Currently    Types: Other-see comments    Comment: CBD drops   Sexual activity: Yes    Partners: Male    Birth control/protection: I.U.D.  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   Social Determinants of Health   Financial Resource Strain: Low Risk    Difficulty of Paying Living Expenses: Not very hard  Food Insecurity: Food Insecurity Present   Worried About [place] in the Last Year: Sometimes true   Arboriculturist in the Last Year: Often true  Transportation Needs: No Transportation Needs   Lack of Transportation (Medical): No   Lack of Transportation (Non-Medical): No  Physical Activity: Sufficiently Active   Days of Exercise per Week: 7 days   Minutes of Exercise per Session: 30 min  Stress: Stress Concern Present   Feeling of Stress : Very much  Social Connections: Moderately Integrated   Frequency of Communication with Friends and Family: Twice a week   Frequency of Social Gatherings with Friends and Family: Once a week   Attends Religious Services: Never   Marine scientist or Organizations: Yes   Attends Music therapist: 1 to 4 times per year   Marital Status: Married    Family History  Problem Relation Age of Onset   Diabetes Father    Hypertension Father    Other Son        reactive airway disease   Asthma Son    Heart murmur Son    Depression Mother    Anxiety disorder Mother    Cancer Maternal Grandmother        lung   Lung cancer Maternal Grandmother    Heart disease Paternal Grandmother    Hypertension Paternal Grandmother    Liver disease Paternal Grandfather        etoh related   Alcohol abuse Maternal Grandfather    Colon cancer Neg Hx     Medications:       Current Outpatient Medications:    albuterol (VENTOLIN HFA) 108 (90 Base) MCG/ACT inhaler, Inhale 2 puffs into the lungs every 6 (six) hours as needed for wheezing or shortness of breath., Disp: 8 g, Rfl: 0   clindamycin (CLEOCIN T) 1 % lotion, Apply topically 2 (two) times daily., Disp: 60 mL, Rfl: 0   EPINEPHrine 0.3 mg/0.3 mL IJ SOAJ injection, , Disp: , Rfl:    Fluoxetine HCl, PMDD, 20 MG TABS, , Disp: , Rfl:    Iron-FA-B  Cmp-C-Biot-Probiotic (FUSION PLUS) CAPS, Take 1 daily, Disp: 44 capsule, Rfl: 0   LORazepam (ATIVAN) 0.5 MG tablet, Take 0.5 mg by mouth 3 (three) times daily  as needed., Disp: , Rfl:    megestrol (MEGACE) 40 MG tablet, Take 2 daily, Disp: 60 tablet, Rfl: 1   hydrOXYzine (ATARAX/VISTARIL) 25 MG tablet, Take 1 tablet (25 mg total) by mouth 3 (three) times daily as needed for anxiety. (Patient not taking: Reported on 08/11/2021), Disp: 60 tablet, Rfl: 0  Objective Blood pressure 115/78, pulse 73, height 5\' 5"  (1.651 m), weight 217 lb (98.4 kg), last menstrual period 07/18/2021.  Gen WDWN NAD  Pertinent ROS No burning with urination, frequency or urgency No nausea, vomiting or diarrhea Nor fever chills or other constitutional symptoms   Labs or studies reviewed    Impression Diagnoses this Encounter::   ICD-10-CM   1. Menometrorrhagia  N92.1     2. Dysmenorrhea  N94.6     3. Sterilization consult  Z30.09       Established relevant diagnosis(es):   Plan/Recommendations: No orders of the defined types were placed in this encounter.   Labs or Scans Ordered: No orders of the defined types were placed in this encounter.   Management:: Hysteroscopy uterine curettage [place] endometrial ablation, laparoscopic bilateral salpingectomy for sterilization, replace IUD after the ablation  Follow up No follow-ups on file.       All questions were answered.

## 2021-08-17 ENCOUNTER — Other Ambulatory Visit: Payer: Self-pay | Admitting: Nurse Practitioner

## 2021-08-17 DIAGNOSIS — J069 Acute upper respiratory infection, unspecified: Secondary | ICD-10-CM

## 2021-08-24 NOTE — Patient Instructions (Addendum)
Your procedure is scheduled on: 08/31/2021  Report to [facility] at   10:15  AM.  Call this number if you have problems the morning of surgery: [phone]   Remember:   Do not Eat after midnight.    You may have clear liquids until 0820 AM.    At 0820 AM, drink your carb drink, you can have nothing else to drink after this.        No Smoking the morning of surgery  :  Take these medicines the morning of surgery with A SIP OF WATER: Prozac, Felexeril, and ativan   Do not wear jewelry, make-up or nail polish.  Do not wear lotions, powders, or perfumes. You may wear deodorant.  Do not shave 48 hours prior to surgery. Men may shave face and neck.  Do not bring valuables to the hospital.  Contacts, dentures or bridgework may not be worn into surgery.  Leave suitcase in the car. After surgery it may be brought to your room.  For patients admitted to the hospital, checkout time is 11:00 AM the day of discharge.   Patients discharged the day of surgery will not be allowed to drive home.    Special Instructions: Shower using CHG night before surgery and shower the day of surgery use CHG.  Use special wash - you have one bottle of CHG for all showers.  You should use approximately 1/2 of the bottle for each shower.  How to Use Chlorhexidine for Bathing Chlorhexidine gluconate (CHG) is a germ-killing (antiseptic) solution that is used to clean the skin. It can get rid of the bacteria that normally live on the skin and can keep them away for about 24 hours. To clean your skin with CHG, you may be given: A CHG solution to use in the shower or as part of a sponge bath. A prepackaged cloth that contains CHG. Cleaning your skin with CHG may help lower the risk for infection: While you are staying in the intensive care unit of the hospital. If you have a vascular access, such as a central line, to provide short-term or long-term access to your veins. If you have a catheter to drain  urine from your bladder. If you are on a ventilator. A ventilator is a machine that helps you breathe by moving air in and out of your lungs. After surgery. What are the risks? Risks of using CHG include: A skin reaction. Hearing loss, if CHG gets in your ears and you have a perforated eardrum. Eye injury, if CHG gets in your eyes and is not rinsed out. The CHG product catching fire. Make sure that you avoid smoking and flames after applying CHG to your skin. Do not use CHG: If you have a chlorhexidine allergy or have previously reacted to chlorhexidine. On babies younger than 76 months of age. How to use CHG solution Use CHG only as told by your health care provider, and follow the instructions on the label. Use the full amount of CHG as directed. Usually, this is one bottle. During a shower Follow these steps when using CHG solution during a shower (unless your health care provider gives you different instructions): Start the shower. Use your normal soap and shampoo to wash your face and hair. Turn off the shower or move out of the shower stream. Pour the CHG onto a clean washcloth. Do not use any type of brush or rough-edged sponge. Starting at your neck, lather your body down to  your toes. Make sure you follow these instructions: If you will be having surgery, pay special attention to the part of your body where you will be having surgery. Scrub this area for at least 1 minute. Do not use CHG on your head or face. If the solution gets into your ears or eyes, rinse them well with water. Avoid your genital area. Avoid any areas of skin that have broken skin, cuts, or scrapes. Scrub your back and under your arms. Make sure to wash skin folds. Let the lather sit on your skin for 1-2 minutes or as long as told by your health care provider. Thoroughly rinse your entire body in the shower. Make sure that all body creases and crevices are rinsed well. Dry off with a clean towel. Do not put  any substances on your body afterward--such as powder, lotion, or perfume--unless you are told to do so by your health care provider. Only use lotions that are recommended by the manufacturer. Put on clean clothes or pajamas. If it is the night before your surgery, sleep in clean sheets.  During a sponge bath Follow these steps when using CHG solution during a sponge bath (unless your health care provider gives you different instructions): Use your normal soap and shampoo to wash your face and hair. Pour the CHG onto a clean washcloth. Starting at your neck, lather your body down to your toes. Make sure you follow these instructions: If you will be having surgery, pay special attention to the part of your body where you will be having surgery. Scrub this area for at least 1 minute. Do not use CHG on your head or face. If the solution gets into your ears or eyes, rinse them well with water. Avoid your genital area. Avoid any areas of skin that have broken skin, cuts, or scrapes. Scrub your back and under your arms. Make sure to wash skin folds. Let the lather sit on your skin for 1-2 minutes or as long as told by your health care provider. Using a different clean, wet washcloth, thoroughly rinse your entire body. Make sure that all body creases and crevices are rinsed well. Dry off with a clean towel. Do not put any substances on your body afterward--such as powder, lotion, or perfume--unless you are told to do so by your health care provider. Only use lotions that are recommended by the manufacturer. Put on clean clothes or pajamas. If it is the night before your surgery, sleep in clean sheets. How to use CHG prepackaged cloths Only use CHG cloths as told by your health care provider, and follow the instructions on the label. Use the CHG cloth on clean, dry skin. Do not use the CHG cloth on your head or face unless your health care provider tells you to. When washing with the CHG cloth: Avoid  your genital area. Avoid any areas of skin that have broken skin, cuts, or scrapes. Before surgery Follow these steps when using a CHG cloth to clean before surgery (unless your health care provider gives you different instructions): Using the CHG cloth, vigorously scrub the part of your body where you will be having surgery. Scrub using a back-and-forth motion for 3 minutes. The area on your body should be completely wet with CHG when you are done scrubbing. Do not rinse. Discard the cloth and let the area air-dry. Do not put any substances on the area afterward, such as powder, lotion, or perfume. Put on clean clothes or pajamas. If  it is the night before your surgery, sleep in clean sheets.  For general bathing Follow these steps when using CHG cloths for general bathing (unless your health care provider gives you different instructions). Use a separate CHG cloth for each area of your body. Make sure you wash between any folds of skin and between your fingers and toes. Wash your body in the following order, switching to a new cloth after each step: The front of your neck, shoulders, and chest. Both of your arms, under your arms, and your hands. Your stomach and groin area, avoiding the genitals. Your right leg and foot. Your left leg and foot. The back of your neck, your back, and your buttocks. Do not rinse. Discard the cloth and let the area air-dry. Do not put any substances on your body afterward--such as powder, lotion, or perfume--unless you are told to do so by your health care provider. Only use lotions that are recommended by the manufacturer. Put on clean clothes or pajamas. Contact a health care provider if: Your skin gets irritated after scrubbing. You have questions about using your solution or cloth. You swallow any chlorhexidine. Call your local poison control center (1-217-639-5546 in the [place] Get help right away if: Your eyes itch badly, or they become very red or  swollen. Your skin itches badly and is red or swollen. Your hearing changes. You have trouble seeing. You have swelling or tingling in your mouth or throat. You have trouble breathing. These symptoms may represent a serious problem that is an emergency. Do not wait to see if the symptoms will go away. Get medical help right away. Call your local emergency services (911 in the [place] Do not drive yourself to the hospital. Summary Chlorhexidine gluconate (CHG) is a germ-killing (antiseptic) solution that is used to clean the skin. Cleaning your skin with CHG may help to lower your risk for infection. You may be given CHG to use for bathing. It may be in a bottle or in a prepackaged cloth to use on your skin. Carefully follow your health care provider's instructions and the instructions on the product label. Do not use CHG if you have a chlorhexidine allergy. Contact your health care provider if your skin gets irritated after scrubbing. This information is not intended to replace advice given to you by your health care provider. Make sure you discuss any questions you have with your health care provider. Document Revised: 09/27/2020 Document Reviewed: 09/27/2020 Elsevier Patient Education  2022 [place]. Endometrial Ablation Endometrial ablation is a procedure that destroys the thin inner layer of the lining of the uterus (endometrium). This procedure may be done: To stop heavy menstrual periods. To stop bleeding that is causing anemia. To control irregular bleeding. To treat bleeding caused by small tumors (fibroids) in the endometrium. This procedure is often done as an alternative to major surgery, such as removal of the uterus and cervix (hysterectomy). As a result of this procedure: You may not be able to have children. However, if you have not yet gone through menopause: You may still have a small chance of getting pregnant. You will need to use a reliable method of birth control  after the procedure to prevent pregnancy. You may stop having a menstrual period, or you may have only a small amount of bleeding during your period. Menstruation may return several years after the procedure. Tell a health care provider about: Any allergies you have. All medicines you are taking, including vitamins, herbs, eye drops,  creams, and over-the-counter medicines. Any problems you or family members have had with the use of anesthetic medicines. Any blood disorders you have. Any surgeries you have had. Any medical conditions you have. Whether you are pregnant or may be pregnant. What are the risks? Generally, this is a safe procedure. However, problems may occur, including: A hole (perforation) in the uterus or bowel. Infection in the uterus, bladder, or vagina. Bleeding. Allergic reaction to medicines. Damage to nearby structures or organs. An air bubble in the lung (air embolus). Problems with pregnancy. Failure of the procedure. Decreased ability to diagnose cancer in the endometrium. Scar tissue forms after the procedure, making it more difficult to get a sample of the uterine lining. What happens before the procedure? Medicines Ask your health care provider about: Changing or stopping your regular medicines. This is especially important if you take diabetes medicines or blood thinners. Taking medicines such as aspirin and ibuprofen. These medicines can thin your blood. Do not take these medicines before your procedure if your doctor tells you not to take them. Taking over-the-counter medicines, vitamins, herbs, and supplements. Tests You will have tests of your endometrium to make sure there are no precancerous cells or cancer cells present. You may have an ultrasound of the uterus. General instructions Do not use any products that contain nicotine or tobacco for at least 4 weeks before the procedure. These include cigarettes, chewing tobacco, and vaping devices, such as  e-cigarettes. If you need help quitting, ask your health care provider. You may be given medicines to thin the endometrium. Ask your health care provider what steps will be taken to help prevent infection. These steps may include: Removing hair at the surgery site. Washing skin with a germ-killing soap. Taking antibiotic medicine. Plan to have a responsible adult take you home from the hospital or clinic. Plan to have a responsible adult care for you for the time you are told after you leave the hospital or clinic. This is important. What happens during the procedure?  You will lie on an exam table with your feet and legs supported as in a pelvic exam. An IV will be inserted into one of your veins. You will be given a medicine to help you relax (sedative). A surgical tool with a light and camera (resectoscope) will be inserted into your vagina and moved into your uterus. This allows your surgeon to see inside your uterus. Endometrial tissue will be destroyed and removed, using one of the following methods: Radiofrequency. This uses an electrical current to destroy the endometrium. Cryotherapy. This uses extreme cold to freeze the endometrium. Heated fluid. This uses a heated salt and water (saline) solution to destroy the endometrium. Microwave. This uses high-energy microwaves to heat up the endometrium and destroy it. Thermal balloon. This involves inserting a catheter with a balloon tip into the uterus. The balloon tip is filled with heated fluid to destroy the endometrium. The procedure may vary among health care providers and hospitals. What happens after the procedure? Your blood pressure, heart rate, breathing rate, and blood oxygen level will be monitored until you leave the hospital or clinic. You may have vaginal bleeding for 4-6 weeks after the procedure. You may also have: Cramps. A thin, watery vaginal discharge that is light pink or brown. A need to urinate more than  usual. Nausea. If you were given a sedative during the procedure, it can affect you for several hours. Do not drive or operate machinery until your health care provider  says that it is safe. Do not have sex or insert anything into your vagina until your health care provider says it is safe. Summary Endometrial ablation is done to treat many causes of heavy menstrual bleeding. The procedure destroys the thin inner layer of the lining of the uterus (endometrium). This procedure is often done as an alternative to major surgery, such as removal of the uterus and cervix (hysterectomy). Plan to have a responsible adult take you home from the hospital or clinic. This information is not intended to replace advice given to you by your health care provider. Make sure you discuss any questions you have with your health care provider. Document Revised: 02/05/2020 Document Reviewed: 02/05/2020 Elsevier Patient Education  [place] After The following information offers guidance on how to care for yourself after your procedure. Your health care provider may also give you more specific instructions. If you have problems or questions, contact your health care provider. What can I expect after the procedure? After the procedure, it is common to have: Pain in your abdomen. Light vaginal bleeding (spotting) for a few days. Tiredness. Your recovery time will depend on which method was used for your surgery. Follow these instructions at home: Medicines Take over-the-counter and prescription medicines only as told by your health care provider. Ask your health care provider if the medicine prescribed to you: Requires you to avoid driving or using machinery. Can cause constipation. You may need to take actions to prevent or treat constipation, such as: Drink enough fluid to keep your urine pale yellow. Take over-the-counter or prescription medicines. Eat foods that are high in fiber,  such as beans, whole grains, and fresh fruits and vegetables. Limit foods that are high in fat and processed sugars, such as fried or sweet foods. Incision care  Follow instructions from your health care provider about how to take care of your incision or incisions. Make sure you: Wash your hands with soap and water for at least 20 seconds before and after you change your bandage (dressing). If soap and water are not available, use hand sanitizer. Change or remove your dressing as told by your health care provider. Leave stitches (sutures), skin glue, staples, or adhesive strips in place. These skin closures may need to stay in place for 2 weeks or longer. If adhesive strip edges start to loosen and curl up, you may trim the loose edges. Do not remove adhesive strips completely unless your health care provider tells you to do that. Keep your dressing clean and dry. Check your incision area every day for signs of infection. Check for: Redness, swelling, or pain that gets worse. Fluid or blood. Warmth. Pus or a bad smell. Activity Rest as told by your health care provider. Avoid sitting for a long time without moving. Get up to take short walks every 1-2 hours. This is important to improve blood flow and breathing. Ask for help if you feel weak or unsteady. Return to your normal activities as told by your health care provider. Ask your health care provider what activities are safe for you. Do not drive until your health care provider says that it is safe. Do not lift anything that is heavier than 10 lb (4.5 kg), or the limit that you are told, until your health care provider says that it is safe. This may last for 2-6 weeks depending on your surgery. Do not douche, use tampons, or have sex until your health care provider approves. General instructions  Do not use any products that contain nicotine or tobacco. These products include cigarettes, chewing tobacco, and vaping devices, such as  e-cigarettes. These can delay healing after surgery. If you need help quitting, ask your health care provider. Wear compression stockings as told by your health care provider. These stockings help to prevent blood clots and reduce swelling in your legs. Do not take baths, swim, or use a hot tub until your health care provider approves. You may take showers. Keep all follow-up visits. This is important. Contact a health care provider if: You have pain when you urinate. You have redness, swelling, or more pain around an incision or an incision feels warm to the touch. You have pus, fluid, blood, or a bad smell coming from an incision or an incision starts to open. You have a fever. You have abdominal pain that gets worse or does not get better with medicine. You have a rash. You feel light-headed, have nausea and vomiting, or both. Get help right away if: You have pain in your chest or leg. You develop shortness of breath. You faint. You have increased or heavy vaginal bleeding, such as soaking a sanitary napkin in an hour. These symptoms may represent a serious problem that is an emergency. Do not wait to see if the symptoms will go away. Get medical help right away. Call your local emergency services (911 in the [place] Do not drive yourself to the hospital. Summary After the procedure, it is common to feel tired, have pain in your abdomen, and have light vaginal bleeding for a few days. Follow instructions from your health care provider about how to take care of your incision or incisions. Return to your normal activities as told by your health care provider. Ask your health care provider what activities are safe for you. Do not douche, use tampons, or have sex until your health care provider approves. Keep all follow-up visits. This is important. This information is not intended to replace advice given to you by your health care provider. Make sure you discuss any questions you have with  your health care provider. Document Revised: 06/08/2020 Document Reviewed: 06/08/2020 Elsevier Patient Education  2022 [place] Anesthesia, Adult, Care After This sheet gives you information about how to care for yourself after your procedure. Your health care provider may also give you more specific instructions. If you have problems or questions, contact your health care provider. What can I expect after the procedure? After the procedure, the following side effects are common: Pain or discomfort at the IV site. Nausea. Vomiting. Sore throat. Trouble concentrating. Feeling cold or chills. Feeling weak or tired. Sleepiness and fatigue. Soreness and body aches. These side effects can affect parts of the body that were not involved in surgery. Follow these instructions at home: For the time period you were told by your health care provider:  Rest. Do not participate in activities where you could fall or become injured. Do not drive or use machinery. Do not drink alcohol. Do not take sleeping pills or medicines that cause drowsiness. Do not make important decisions or sign legal documents. Do not take care of children on your own. Eating and drinking Follow any instructions from your health care provider about eating or drinking restrictions. When you feel hungry, start by eating small amounts of foods that are soft and easy to digest (bland), such as toast. Gradually return to your regular diet. Drink enough fluid to keep your urine pale yellow. If you  vomit, rehydrate by drinking water, juice, or clear broth. General instructions If you have sleep apnea, surgery and certain medicines can increase your risk for breathing problems. Follow instructions from your health care provider about wearing your sleep device: Anytime you are sleeping, including during daytime naps. While taking prescription pain medicines, sleeping medicines, or medicines that make you drowsy. Have  a responsible adult stay with you for the time you are told. It is important to have someone help care for you until you are awake and alert. Return to your normal activities as told by your health care provider. Ask your health care provider what activities are safe for you. Take over-the-counter and prescription medicines only as told by your health care provider. If you smoke, do not smoke without supervision. Keep all follow-up visits as told by your health care provider. This is important. Contact a health care provider if: You have nausea or vomiting that does not get better with medicine. You cannot eat or drink without vomiting. You have pain that does not get better with medicine. You are unable to pass urine. You develop a skin rash. You have a fever. You have redness around your IV site that gets worse. Get help right away if: You have difficulty breathing. You have chest pain. You have blood in your urine or stool, or you vomit blood. Summary After the procedure, it is common to have a sore throat or nausea. It is also common to feel tired. Have a responsible adult stay with you for the time you are told. It is important to have someone help care for you until you are awake and alert. When you feel hungry, start by eating small amounts of foods that are soft and easy to digest (bland), such as toast. Gradually return to your regular diet. Drink enough fluid to keep your urine pale yellow. Return to your normal activities as told by your health care provider. Ask your health care provider what activities are safe for you. This information is not intended to replace advice given to you by your health care provider. Make sure you discuss any questions you have with your health care provider. Document Revised: 04/01/2020 Document Reviewed: 10/30/2019 Elsevier Patient Education  2022 [employer].

## 2021-08-29 ENCOUNTER — Encounter (HOSPITAL_COMMUNITY): Payer: Self-pay

## 2021-08-29 ENCOUNTER — Other Ambulatory Visit: Payer: Self-pay | Admitting: Obstetrics & Gynecology

## 2021-08-29 ENCOUNTER — Encounter (HOSPITAL_COMMUNITY)
Admission: RE | Admit: 2021-08-29 | Discharge: 2021-08-29 | Disposition: A | Discharge status: Home / Self Care | Payer: 59 | Source: Ambulatory Visit | Attending: Obstetrics & Gynecology | Admitting: Obstetrics & Gynecology

## 2021-08-29 DIAGNOSIS — Z01818 Encounter for other preprocedural examination: Secondary | ICD-10-CM | POA: Diagnosis present

## 2021-08-29 HISTORY — DX: Unspecified asthma, uncomplicated: J45.909

## 2021-08-29 LAB — COMPREHENSIVE METABOLIC PANEL
ALT: 18 U/L (ref 0–44)
AST: 16 U/L (ref 15–41)
Albumin: 4.1 g/dL (ref 3.5–5.0)
Alkaline Phosphatase: 72 U/L (ref 38–126)
Anion gap: 8 (ref 5–15)
BUN: 8 mg/dL (ref 6–20)
CO2: 24 mmol/L (ref 22–32)
Calcium: 8.9 mg/dL (ref 8.9–10.3)
Chloride: 101 mmol/L (ref 98–111)
Creatinine, Ser: 0.67 mg/dL (ref 0.44–1.00)
GFR, Estimated: >60 mL/min (ref >60)
Glucose, Bld: 84 mg/dL (ref 70–99)
Potassium: 3.6 mmol/L (ref 3.5–5.1)
Sodium: 133 mmol/L — ABNORMAL LOW (ref 135–145)
Total Bilirubin: 0.6 mg/dL (ref 0.3–1.2)
Total Protein: 7.9 g/dL (ref 6.5–8.1)

## 2021-08-29 LAB — URINALYSIS, MICROSCOPIC (REFLEX)

## 2021-08-29 LAB — RAPID HIV SCREEN (HIV 1/2 AB+AG)
HIV 1/2 Antibodies: NONREACTIVE
HIV-1 P24 Antigen - HIV24: NONREACTIVE

## 2021-08-29 LAB — URINALYSIS, ROUTINE W REFLEX MICROSCOPIC
Bilirubin Urine: NEGATIVE
Glucose, UA: NEGATIVE mg/dL
Ketones, ur: NEGATIVE mg/dL
Leukocytes,Ua: NEGATIVE
Nitrite: NEGATIVE
Protein, ur: NEGATIVE mg/dL
Specific Gravity, Urine: <1.005 — ABNORMAL LOW (ref 1.005–1.030)
pH: 5.5 (ref 5.0–8.0)

## 2021-08-29 LAB — CBC
HCT: 34 % — ABNORMAL LOW (ref 36.0–46.0)
Hemoglobin: 11 g/dL — ABNORMAL LOW (ref 12.0–15.0)
MCH: 28.2 pg (ref 26.0–34.0)
MCHC: 32.4 g/dL (ref 30.0–36.0)
MCV: 87.2 fL (ref 80.0–100.0)
Platelets: 263 10*3/uL (ref 150–400)
RBC: 3.9 MIL/uL (ref 3.87–5.11)
RDW: 14.9 % (ref 11.5–15.5)
WBC: 5.2 10*3/uL (ref 4.0–10.5)
nRBC: 0 % (ref 0.0–0.2)

## 2021-08-29 LAB — HCG, QUANTITATIVE, PREGNANCY: hCG, Beta Chain, Quant, S: <1 m[IU]/mL (ref <5)

## 2021-08-30 NOTE — Progress Notes (Signed)
Called patient and left voicemail stating we would like to move patients surgery schedule time up and to arrive at 6:15am and to return phone call to confirm.

## 2021-08-30 NOTE — Progress Notes (Signed)
Spoke with patient and patient knows to arrive at 0615am for surgery.

## 2021-08-31 ENCOUNTER — Encounter (HOSPITAL_COMMUNITY)
Admission: RE | Disposition: A | Discharge status: Home / Self Care | Payer: Self-pay | Source: Ambulatory Visit | Attending: Obstetrics & Gynecology

## 2021-08-31 ENCOUNTER — Ambulatory Visit (HOSPITAL_COMMUNITY): Payer: 59 | Admitting: Anesthesiology

## 2021-08-31 ENCOUNTER — Encounter (HOSPITAL_COMMUNITY): Payer: Self-pay | Admitting: Obstetrics & Gynecology

## 2021-08-31 ENCOUNTER — Other Ambulatory Visit: Payer: Self-pay

## 2021-08-31 ENCOUNTER — Ambulatory Visit (HOSPITAL_COMMUNITY)
Admission: RE | Admit: 2021-08-31 | Discharge: 2021-08-31 | Disposition: A | Discharge status: Home / Self Care | Payer: 59 | Source: Ambulatory Visit | Attending: Obstetrics & Gynecology | Admitting: Obstetrics & Gynecology

## 2021-08-31 DIAGNOSIS — I499 Cardiac arrhythmia, unspecified: Secondary | ICD-10-CM | POA: Insufficient documentation

## 2021-08-31 DIAGNOSIS — N840 Polyp of corpus uteri: Secondary | ICD-10-CM | POA: Insufficient documentation

## 2021-08-31 DIAGNOSIS — Z302 Encounter for sterilization: Secondary | ICD-10-CM

## 2021-08-31 DIAGNOSIS — D649 Anemia, unspecified: Secondary | ICD-10-CM | POA: Diagnosis not present

## 2021-08-31 DIAGNOSIS — N921 Excessive and frequent menstruation with irregular cycle: Secondary | ICD-10-CM

## 2021-08-31 DIAGNOSIS — J449 Chronic obstructive pulmonary disease, unspecified: Secondary | ICD-10-CM | POA: Insufficient documentation

## 2021-08-31 DIAGNOSIS — N946 Dysmenorrhea, unspecified: Secondary | ICD-10-CM | POA: Diagnosis not present

## 2021-08-31 HISTORY — PX: LAPAROSCOPIC BILATERAL SALPINGECTOMY: SHX5889

## 2021-08-31 HISTORY — PX: DILATATION AND CURETTAGE/HYSTEROSCOPY WITH MINERVA: SHX6851

## 2021-08-31 SURGERY — SALPINGECTOMY, BILATERAL, LAPAROSCOPIC
Anesthesia: General | Site: Vagina

## 2021-08-31 MED ORDER — LIDOCAINE HCL (PF) 2 % IJ SOLN
INTRAMUSCULAR | Status: AC
  Filled 2021-08-31: qty 5

## 2021-08-31 MED ORDER — BUPIVACAINE LIPOSOME 1.3 % IJ SUSP
INTRAMUSCULAR | Status: AC
  Filled 2021-08-31: qty 20

## 2021-08-31 MED ORDER — SODIUM CHLORIDE 0.9 % IR SOLN
Status: DC | PRN
  Administered 2021-08-31: 1000 mL

## 2021-08-31 MED ORDER — KETOROLAC TROMETHAMINE 10 MG PO TABS: 10.0000 mg | ORAL_TABLET | Freq: Three times a day (TID) | ORAL | 0 refills | Status: DC | PRN

## 2021-08-31 MED ORDER — 0.9 % SODIUM CHLORIDE (POUR BTL) OPTIME
TOPICAL | Status: DC | PRN
  Administered 2021-08-31: 1000 mL

## 2021-08-31 MED ORDER — PROPOFOL 10 MG/ML IV BOLUS
INTRAVENOUS | Status: AC
  Filled 2021-08-31: qty 20

## 2021-08-31 MED ORDER — CEFAZOLIN SODIUM-DEXTROSE 2-4 GM/100ML-% IV SOLN
2.0000 g | INTRAVENOUS | Status: AC
  Administered 2021-08-31: 2 g via INTRAVENOUS

## 2021-08-31 MED ORDER — LACTATED RINGERS IV SOLN: INTRAVENOUS | Status: DC

## 2021-08-31 MED ORDER — FENTANYL CITRATE (PF) 250 MCG/5ML IJ SOLN
INTRAMUSCULAR | Status: DC | PRN
Start: 2021-08-31 — End: 2021-08-31
  Administered 2021-08-31 (×2): 50 ug via INTRAVENOUS
  Administered 2021-08-31: 100 ug via INTRAVENOUS
  Administered 2021-08-31 (×2): 25 ug via INTRAVENOUS

## 2021-08-31 MED ORDER — DEXMEDETOMIDINE (PRECEDEX) IN NS 20 MCG/5ML (4 MCG/ML) IV SYRINGE
PREFILLED_SYRINGE | INTRAVENOUS | Status: DC | PRN
  Administered 2021-08-31 (×2): 8 ug via INTRAVENOUS
  Administered 2021-08-31: 4 ug via INTRAVENOUS

## 2021-08-31 MED ORDER — MIDAZOLAM HCL 5 MG/5ML IJ SOLN
INTRAMUSCULAR | Status: DC | PRN
  Administered 2021-08-31: 2 mg via INTRAVENOUS

## 2021-08-31 MED ORDER — DEXAMETHASONE SODIUM PHOSPHATE 10 MG/ML IJ SOLN
INTRAMUSCULAR | Status: DC | PRN
  Administered 2021-08-31: 10 mg via INTRAVENOUS

## 2021-08-31 MED ORDER — HYDROMORPHONE HCL 1 MG/ML IJ SOLN: 0.2500 mg | INTRAMUSCULAR | Status: DC | PRN

## 2021-08-31 MED ORDER — SUGAMMADEX SODIUM 200 MG/2ML IV SOLN
INTRAVENOUS | Status: DC | PRN
  Administered 2021-08-31: 200 mg via INTRAVENOUS

## 2021-08-31 MED ORDER — KETOROLAC TROMETHAMINE 30 MG/ML IJ SOLN
30.0000 mg | Freq: Once | INTRAMUSCULAR | Status: AC
  Administered 2021-08-31: 30 mg via INTRAVENOUS
  Filled 2021-08-31: qty 1

## 2021-08-31 MED ORDER — FENTANYL CITRATE (PF) 250 MCG/5ML IJ SOLN
INTRAMUSCULAR | Status: AC
  Filled 2021-08-31: qty 5

## 2021-08-31 MED ORDER — ACETAMINOPHEN 10 MG/ML IV SOLN
INTRAVENOUS | Status: DC | PRN
Start: 2021-08-31 — End: 2021-08-31
  Administered 2021-08-31: 1000 mg via INTRAVENOUS

## 2021-08-31 MED ORDER — HYDROCODONE-ACETAMINOPHEN 5-325 MG PO TABS: 1.0000 | ORAL_TABLET | Freq: Four times a day (QID) | ORAL | 0 refills | Status: DC | PRN

## 2021-08-31 MED ORDER — BUPIVACAINE LIPOSOME 1.3 % IJ SUSP
INTRAMUSCULAR | Status: DC | PRN
  Administered 2021-08-31: 20 mL

## 2021-08-31 MED ORDER — MEPERIDINE HCL 50 MG/ML IJ SOLN: 6.2500 mg | INTRAMUSCULAR | Status: DC | PRN

## 2021-08-31 MED ORDER — ONDANSETRON 8 MG PO TBDP: 8.0000 mg | ORAL_TABLET | Freq: Three times a day (TID) | ORAL | 0 refills | Status: DC | PRN

## 2021-08-31 MED ORDER — PROPOFOL 500 MG/50ML IV EMUL
INTRAVENOUS | Status: DC | PRN
  Administered 2021-08-31: 25 ug/kg/min via INTRAVENOUS

## 2021-08-31 MED ORDER — ONDANSETRON HCL 4 MG/2ML IJ SOLN: 4.0000 mg | Freq: Once | INTRAMUSCULAR | Status: DC | PRN

## 2021-08-31 MED ORDER — ROCURONIUM BROMIDE 10 MG/ML (PF) SYRINGE
PREFILLED_SYRINGE | INTRAVENOUS | Status: AC
  Filled 2021-08-31: qty 10

## 2021-08-31 MED ORDER — ONDANSETRON HCL 4 MG/2ML IJ SOLN
INTRAMUSCULAR | Status: DC | PRN
  Administered 2021-08-31: 4 mg via INTRAVENOUS

## 2021-08-31 MED ORDER — ONDANSETRON HCL 4 MG/2ML IJ SOLN
INTRAMUSCULAR | Status: AC
  Filled 2021-08-31: qty 2

## 2021-08-31 MED ORDER — POVIDONE-IODINE 10 % EX SWAB: 2.0000 "application " | Freq: Once | CUTANEOUS | Status: DC

## 2021-08-31 MED ORDER — ROCURONIUM BROMIDE 10 MG/ML (PF) SYRINGE
PREFILLED_SYRINGE | INTRAVENOUS | Status: DC | PRN
  Administered 2021-08-31: 50 mg via INTRAVENOUS
  Administered 2021-08-31: 10 mg via INTRAVENOUS

## 2021-08-31 MED ORDER — CEFAZOLIN SODIUM-DEXTROSE 2-4 GM/100ML-% IV SOLN
INTRAVENOUS | Status: AC
  Filled 2021-08-31: qty 100

## 2021-08-31 MED ORDER — MIDAZOLAM HCL 2 MG/2ML IJ SOLN
INTRAMUSCULAR | Status: AC
  Filled 2021-08-31: qty 2

## 2021-08-31 MED ORDER — PROPOFOL 10 MG/ML IV BOLUS
INTRAVENOUS | Status: DC | PRN
  Administered 2021-08-31: 150 mg via INTRAVENOUS

## 2021-08-31 MED ORDER — KETOROLAC TROMETHAMINE 30 MG/ML IJ SOLN
INTRAMUSCULAR | Status: AC
  Filled 2021-08-31: qty 1

## 2021-08-31 MED ORDER — ACETAMINOPHEN 10 MG/ML IV SOLN
INTRAVENOUS | Status: AC
  Filled 2021-08-31: qty 100

## 2021-08-31 MED ORDER — LIDOCAINE 2% (20 MG/ML) 5 ML SYRINGE
INTRAMUSCULAR | Status: DC | PRN
  Administered 2021-08-31: 60 mg via INTRAVENOUS

## 2021-08-31 MED ORDER — ORAL CARE MOUTH RINSE: 15.0000 mL | Freq: Once | OROMUCOSAL | Status: AC

## 2021-08-31 MED ORDER — CHLORHEXIDINE GLUCONATE 0.12 % MT SOLN
15.0000 mL | Freq: Once | OROMUCOSAL | Status: AC
  Administered 2021-08-31: 15 mL via OROMUCOSAL

## 2021-08-31 MED ORDER — DEXAMETHASONE SODIUM PHOSPHATE 10 MG/ML IJ SOLN
INTRAMUSCULAR | Status: AC
  Filled 2021-08-31: qty 1

## 2021-08-31 MED ORDER — BUPIVACAINE LIPOSOME 1.3 % IJ SUSP
20.0000 mL | Freq: Once | INTRAMUSCULAR | Status: DC
Start: 2021-08-31 — End: 2021-08-31
  Filled 2021-08-31: qty 20

## 2021-08-31 MED ORDER — DEXMEDETOMIDINE (PRECEDEX) IN NS 20 MCG/5ML (4 MCG/ML) IV SYRINGE
PREFILLED_SYRINGE | INTRAVENOUS | Status: AC
  Filled 2021-08-31: qty 5

## 2021-08-31 MED ORDER — SCOPOLAMINE 1 MG/3DAYS TD PT72
1.0000 | MEDICATED_PATCH | Freq: Once | TRANSDERMAL | Status: DC
  Administered 2021-08-31: 1.5 mg via TRANSDERMAL
  Filled 2021-08-31: qty 1

## 2021-08-31 SURGICAL SUPPLY — 53 items
ADH SKN CLS APL DERMABOND .7 (GAUZE/BANDAGES/DRESSINGS) ×2
BAG HAMPER (MISCELLANEOUS) ×3 IMPLANT
BLADE SURG SZ11 CARB STEEL (BLADE) ×3 IMPLANT
CLOTH BEACON ORANGE TIMEOUT ST (SAFETY) ×3 IMPLANT
COVER LIGHT HANDLE STERIS (MISCELLANEOUS) ×6 IMPLANT
COVER MAYO STAND XLG (MISCELLANEOUS) ×1 IMPLANT
DERMABOND ADVANCED (GAUZE/BANDAGES/DRESSINGS) ×1
DERMABOND ADVANCED .7 DNX12 (GAUZE/BANDAGES/DRESSINGS) ×2 IMPLANT
ELECT REM PT RETURN 9FT ADLT (ELECTROSURGICAL) ×3
ELECTRODE REM PT RTRN 9FT ADLT (ELECTROSURGICAL) ×2 IMPLANT
GAUZE 4X4 16PLY ~~LOC~~+RFID DBL (SPONGE) ×7 IMPLANT
GLOVE ECLIPSE 8.0 STRL XLNG CF (GLOVE) ×3 IMPLANT
GLOVE SRG 8 PF TXTR STRL LF DI (GLOVE) ×2 IMPLANT
GLOVE SURG UNDER POLY LF SZ7 (GLOVE) ×9 IMPLANT
GLOVE SURG UNDER POLY LF SZ8 (GLOVE) ×3
GOWN STRL REUS W/TWL LRG LVL3 (GOWN DISPOSABLE) ×3 IMPLANT
GOWN STRL REUS W/TWL XL LVL3 (GOWN DISPOSABLE) ×3 IMPLANT
HANDPIECE ABLA MINERVA ENDO (MISCELLANEOUS) ×3 IMPLANT
INST SET HYSTEROSCOPY (KITS) ×3 IMPLANT
INST SET LAPROSCOPIC GYN AP (KITS) ×3 IMPLANT
IV NS 1000ML (IV SOLUTION) ×3
IV NS 1000ML BAXH (IV SOLUTION) ×2 IMPLANT
KIT TURNOVER CYSTO (KITS) ×3 IMPLANT
MANIFOLD NEPTUNE II (INSTRUMENTS) ×3 IMPLANT
MARKER SKIN DUAL TIP RULER LAB (MISCELLANEOUS) ×3 IMPLANT
NDL HYPO 18GX1.5 BLUNT FILL (NEEDLE) ×2 IMPLANT
NDL HYPO 21X1.5 SAFETY (NEEDLE) ×2 IMPLANT
NDL INSUFFLATION 14GA 120MM (NEEDLE) ×2 IMPLANT
NEEDLE HYPO 18GX1.5 BLUNT FILL (NEEDLE) ×3 IMPLANT
NEEDLE HYPO 21X1.5 SAFETY (NEEDLE) ×3 IMPLANT
NEEDLE INSUFFLATION 14GA 120MM (NEEDLE) ×3 IMPLANT
NS IRRIG 1000ML POUR BTL (IV SOLUTION) ×3 IMPLANT
PACK BASIC III (CUSTOM PROCEDURE TRAY) ×3
PACK PERI GYN (CUSTOM PROCEDURE TRAY) ×3 IMPLANT
PACK SRG BSC III STRL LF ECLPS (CUSTOM PROCEDURE TRAY) ×2 IMPLANT
PAD ARMBOARD 7.5X6 YLW CONV (MISCELLANEOUS) ×3 IMPLANT
PAD TELFA 3X4 1S STER (GAUZE/BANDAGES/DRESSINGS) ×3 IMPLANT
SET BASIN LINEN APH (SET/KITS/TRAYS/PACK) ×3 IMPLANT
SET CYSTO W/LG BORE CLAMP LF (SET/KITS/TRAYS/PACK) ×3 IMPLANT
SET TUBE SMOKE EVAC HIGH FLOW (TUBING) ×3 IMPLANT
SHEARS HARMONIC ACE PLUS 36CM (ENDOMECHANICALS) ×3 IMPLANT
SHEET LAVH (DRAPES) ×3 IMPLANT
SLEEVE ENDOPATH XCEL 5M (ENDOMECHANICALS) ×3 IMPLANT
SOL ANTI FOG 6CC (MISCELLANEOUS) ×2 IMPLANT
SOLUTION ANTI FOG 6CC (MISCELLANEOUS) ×1
SUT VICRYL 0 UR6 27IN ABS (SUTURE) ×4 IMPLANT
SUT VICRYL AB 3-0 FS1 BRD 27IN (SUTURE) ×4 IMPLANT
SYR 10ML LL (SYRINGE) ×3 IMPLANT
SYR 20ML LL LF (SYRINGE) ×5 IMPLANT
TROCAR ENDO BLADELESS 11MM (ENDOMECHANICALS) ×3 IMPLANT
TROCAR XCEL NON-BLD 5MMX100MML (ENDOMECHANICALS) ×3 IMPLANT
TUBE CONNECTING 12X1/4 (SUCTIONS) ×3 IMPLANT
WARMER LAPAROSCOPE (MISCELLANEOUS) ×3 IMPLANT

## 2021-08-31 NOTE — H&P (Signed)
Preoperative History and Physical  Emily Morales is a 31 y.o. [id number] with No LMP recorded. (Menstrual status: IUD). admitted for a hysteroscopy uterine curettage Minerva endometrial ablation and laparoscopic bilateral salpingectomy for sterilization.   Long history of menstrual issues, heavy, clots, heavy cramps, long irregular, not responsive to IUD + megestrol Also desires permanent sterilization No dyspareunia No pain if is not bleeding PMH:    Past Medical History:  Diagnosis Date   Abnormal Pap smear    Anemia    Anxiety    Arthritis    Asthma    Chronic back pain    Depression    Dysrhythmia    irregular heartrate   GERD (gastroesophageal reflux disease)    Headache(784.0)    Hx of chlamydia infection    Ovarian cyst, right    PCOS (polycystic ovarian syndrome)    Perimenopausal    Uterine fibroid     PSH:     Past Surgical History:  Procedure Laterality Date   BIOPSY  11/14/2016   Procedure: BIOPSY;  Surgeon: Danie Binder, MD;  Location: [facility];  Service: Endoscopy;;  duodenal gastric   COLPOSCOPY     ESOPHAGOGASTRODUODENOSCOPY (EGD) WITH PROPOFOL N/A 11/14/2016   Dr. Oneida Alar: Mild gastritis, normal esophagus, biopsy showed inflammation of the small bowel, gastric biopsies with no H. pylori.   lipoma surgery     MASS EXCISION N/A 02/28/2017   Procedure: EXCISION LIPOMA SCALP 3CM;  Surgeon: Aviva Signs, MD;  Location: [facility];  Service: General;  Laterality: N/A;   UMBILICAL HERNIA REPAIR N/A 04/04/2019   Procedure: HERNIA REPAIR UMBILICAL ADULT WITH MESH;  Surgeon: Virl Cagey, MD;  Location: [facility];  Service: General;  Laterality: N/A;    POb/GynH:      OB History     Gravida  2   Para  2   Term  2   Preterm  0   AB  0   Living  2      SAB  0   IAB  0   Ectopic  0   Multiple  0   Live Births  2           SH:   Social History   Tobacco Use   Smoking status: Never   Smokeless tobacco: Never  Vaping Use    Vaping Use: Never used  Substance Use Topics   Alcohol use: Yes    Comment: occassional    Drug use: Not Currently    Types: Other-see comments    Comment: CBD drops    FH:    Family History  Problem Relation Age of Onset   Diabetes Father    Hypertension Father    Other Son        reactive airway disease   Asthma Son    Heart murmur Son    Depression Mother    Anxiety disorder Mother    Cancer Maternal Grandmother        lung   Lung cancer Maternal Grandmother    Heart disease Paternal Grandmother    Hypertension Paternal Grandmother    Liver disease Paternal Grandfather        etoh related   Alcohol abuse Maternal Grandfather    Colon cancer Neg Hx      Allergies:  Allergies  Allergen Reactions   Other Anaphylaxis, Palpitations and Rash    Water chestnut   Cat Hair Extract Cough   Cattle Epithelium Cough and Itching  Dog Fennel Cough   Hctz [Hydrochlorothiazide] Itching   Meat [Alpha-Gal] Hives    Red Meat   Mixed Feathers Cough and Itching   Mixed Grasses Itching    trees   Pollen Extract Cough   Tree Extract Itching   Latex Rash    Medications:       Current Facility-Administered Medications:    bupivacaine liposome (EXPAREL) 1.3 % injection 266 mg, 20 mL, Infiltration, Once, Beretta Ginsberg, Mertie Clause, MD   ceFAZolin (ANCEF) 2-4 GM/100ML-% IVPB, , , ,    ceFAZolin (ANCEF) IVPB 2g/100 mL premix, 2 g, Intravenous, On Call to OR, Florian Buff, MD   ketorolac (TORADOL) 30 MG/ML injection, , , ,    lactated ringers infusion, , Intravenous, Continuous, Battula, Rajamani C, MD   povidone-iodine 10 % swab 2 application, 2 application, Topical, Once, Shawna Kiener, Mertie Clause, MD   scopolamine (TRANSDERM-SCOP) 1 MG/3DAYS 1.5 mg, 1 patch, Transdermal, Once, Battula, Rajamani C, MD, 1.5 mg at 08/31/21 0719  Review of Systems:   Review of Systems  Constitutional: Negative for fever, chills, weight loss, malaise/fatigue and diaphoresis.  HENT: Negative for hearing loss, ear pain,  nosebleeds, congestion, sore throat, neck pain, tinnitus and ear discharge.   Eyes: Negative for blurred vision, double vision, photophobia, pain, discharge and redness.  Respiratory: Negative for cough, hemoptysis, sputum production, shortness of breath, wheezing and stridor.   Cardiovascular: Negative for chest pain, palpitations, orthopnea, claudication, leg swelling and PND.  Gastrointestinal: Positive for abdominal pain. Negative for heartburn, nausea, vomiting, diarrhea, constipation, blood in stool and melena.  Genitourinary: Negative for dysuria, urgency, frequency, hematuria and flank pain.  Musculoskeletal: Negative for myalgias, back pain, joint pain and falls.  Skin: Negative for itching and rash.  Neurological: Negative for dizziness, tingling, tremors, sensory change, speech change, focal weakness, seizures, loss of consciousness, weakness and headaches.  Endo/Heme/Allergies: Negative for environmental allergies and polydipsia. Does not bruise/bleed easily.  Psychiatric/Behavioral: Negative for depression, suicidal ideas, hallucinations, memory loss and substance abuse. The patient is not nervous/anxious and does not have insomnia.      PHYSICAL EXAM:  Blood pressure (!) 130/92, pulse 84, temperature 98.5 F (36.9 C), temperature source Oral, resp. rate (!) 24, SpO2 100 %.    Vitals reviewed. Constitutional: She is oriented to person, place, and time. She appears well-developed and well-nourished.  HENT:  Head: Normocephalic and atraumatic.  Right Ear: External ear normal.  Left Ear: External ear normal.  Nose: Nose normal.  Mouth/Throat: Oropharynx is clear and moist.  Eyes: Conjunctivae and EOM are normal. Pupils are equal, round, and reactive to light. Right eye exhibits no discharge. Left eye exhibits no discharge. No scleral icterus.  Neck: Normal range of motion. Neck supple. No tracheal deviation present. No thyromegaly present.  Cardiovascular: Normal rate, regular  rhythm, normal heart sounds and intact distal pulses.  Exam reveals no gallop and no friction rub.   No murmur heard. Respiratory: Effort normal and breath sounds normal. No respiratory distress. She has no wheezes. She has no rales. She exhibits no tenderness.  GI: Soft. Bowel sounds are normal. She exhibits no distension and no mass. There is tenderness. There is no rebound and no guarding.  Genitourinary:       Vulva is normal without lesions Vagina is pink moist without discharge Cervix normal in appearance and pap is normal Uterus is normal size, contour, position, consistency, mobility, non-tender Adnexa is negative with normal sized ovaries by sonogram  Musculoskeletal: Normal range of motion. She  exhibits no edema and no tenderness.  Neurological: She is alert and oriented to person, place, and time. She has normal reflexes. She displays normal reflexes. No cranial nerve deficit. She exhibits normal muscle tone. Coordination normal.  Skin: Skin is warm and dry. No rash noted. No erythema. No pallor.  Psychiatric: She has a normal mood and affect. Her behavior is normal. Judgment and thought content normal.    Labs: Results for orders placed or performed during the hospital encounter of 08/29/21 (from the past 336 hour(s))  CBC   Collection Time: 08/29/21  1:00 PM  Result Value Ref Range   WBC 5.2 4.0 - 10.5 K/uL   RBC 3.90 3.87 - 5.11 MIL/uL   Hemoglobin 11.0 (L) 12.0 - 15.0 g/dL   HCT 34.0 (L) 36.0 - 46.0 %   MCV 87.2 80.0 - 100.0 fL   MCH 28.2 26.0 - 34.0 pg   MCHC 32.4 30.0 - 36.0 g/dL   RDW 14.9 11.5 - 15.5 %   Platelets 263 150 - 400 K/uL   nRBC 0.0 0.0 - 0.2 %  Comprehensive metabolic panel   Collection Time: 08/29/21  1:00 PM  Result Value Ref Range   Sodium 133 (L) 135 - 145 mmol/L   Potassium 3.6 3.5 - 5.1 mmol/L   Chloride 101 98 - 111 mmol/L   CO2 24 22 - 32 mmol/L   Glucose, Bld 84 70 - 99 mg/dL   BUN 8 6 - 20 mg/dL   Creatinine, Ser 0.67 0.44 - 1.00 mg/dL    Calcium 8.9 8.9 - 10.3 mg/dL   Total Protein 7.9 6.5 - 8.1 g/dL   Albumin 4.1 3.5 - 5.0 g/dL   AST 16 15 - 41 U/L   ALT 18 0 - 44 U/L   Alkaline Phosphatase 72 38 - 126 U/L   Total Bilirubin 0.6 0.3 - 1.2 mg/dL   GFR, Estimated >60 >60 mL/min   Anion gap 8 5 - 15  hCG, quantitative, pregnancy   Collection Time: 08/29/21  1:00 PM  Result Value Ref Range   hCG, Beta Chain, Quant, S <1 <5 mIU/mL  Rapid HIV screen (HIV 1/2 Ab+Ag)   Collection Time: 08/29/21  1:00 PM  Result Value Ref Range   HIV-1 P24 Antigen - HIV24 NON REACTIVE NON REACTIVE   HIV 1/2 Antibodies NON REACTIVE NON REACTIVE   Interpretation (HIV Ag Ab)      A non reactive test result means that HIV 1 or HIV 2 antibodies and HIV 1 p24 antigen were not detected in the specimen.  Urinalysis, Routine w reflex microscopic Urine, Clean Catch   Collection Time: 08/29/21  1:00 PM  Result Value Ref Range   Color, Urine YELLOW YELLOW   APPearance CLEAR CLEAR   Specific Gravity, Urine <1.005 (L) 1.005 - 1.030   pH 5.5 5.0 - 8.0   Glucose, UA NEGATIVE NEGATIVE mg/dL   Hgb urine dipstick TRACE (A) NEGATIVE   Bilirubin Urine NEGATIVE NEGATIVE   Ketones, ur NEGATIVE NEGATIVE mg/dL   Protein, ur NEGATIVE NEGATIVE mg/dL   Nitrite NEGATIVE NEGATIVE   Leukocytes,Ua NEGATIVE NEGATIVE  Urinalysis, Microscopic (reflex)   Collection Time: 08/29/21  1:00 PM  Result Value Ref Range   RBC / HPF 0-5 0 - 5 RBC/hpf   WBC, UA 0-5 0 - 5 WBC/hpf   Bacteria, UA RARE (A) NONE SEEN   Squamous Epithelial / LPF 0-5 0 - 5    EKG: Orders placed or performed during the hospital  encounter of 08/29/21   EKG 12-Lead   EKG 12-Lead    Imaging Studies: No results found.    Assessment: Menometrorrhagia Dysmenorrhea Desire for permanent sterilization, opts for salpingectomy  Plan: Hysteroscopy uterine curettage Minerva endometrial ablation, laparoscopic bilateral salpingectomy for sterilization, replace IUD after the ablation    Florian Buff 08/31/2021 7:23 AM

## 2021-08-31 NOTE — Anesthesia Preprocedure Evaluation (Addendum)
Anesthesia Evaluation  Patient identified by MRN, date of birth, ID band Patient awake    Reviewed: Allergy & Precautions, NPO status , Patient's Chart, lab work & pertinent test results  History of Anesthesia Complications (+) Emergence Delirium  Airway Mallampati: II  TM Distance: >3 FB Neck ROM: Full    Dental no notable dental hx. (+) Dental Advisory Given, Teeth Intact   Pulmonary asthma , neg COPD,  COPD inhaler,    Pulmonary exam normal breath sounds clear to auscultation       Cardiovascular Exercise Tolerance: Good Normal cardiovascular exam+ dysrhythmias (irregular heart beat)  Rhythm:Regular Rate:Normal     Neuro/Psych  Headaches, PSYCHIATRIC DISORDERS Anxiety Depression    GI/Hepatic Neg liver ROS, GERD  ,  Endo/Other  negative endocrine ROS  Renal/GU negative Renal ROS  negative genitourinary   Musculoskeletal  (+) Arthritis ,   Abdominal   Peds negative pediatric ROS (+)  Hematology  (+) Blood dyscrasia, anemia ,   Anesthesia Other Findings   Reproductive/Obstetrics negative OB ROS                            Anesthesia Physical Anesthesia Plan  ASA: 2  Anesthesia Plan: General   Post-op Pain Management: Dilaudid IV   Induction: Intravenous  PONV Risk Score and Plan: 4 or greater and Ondansetron, Dexamethasone, Midazolam and Scopolamine patch - Pre-op  Airway Management Planned: Oral ETT  Additional Equipment:   Intra-op Plan:   Post-operative Plan: Extubation in OR  Informed Consent: I have reviewed the patients History and Physical, chart, labs and discussed the procedure including the risks, benefits and alternatives for the proposed anesthesia with the patient or authorized representative who has indicated his/her understanding and acceptance.     Dental advisory given  Plan Discussed with: CRNA and Surgeon  Anesthesia Plan Comments:          Anesthesia Quick Evaluation

## 2021-08-31 NOTE — Transfer of Care (Signed)
Immediate Anesthesia Transfer of Care Note  Patient: Emily Morales  Procedure(s) Performed: LAPAROSCOPIC BILATERAL SALPINGECTOMY (Bilateral: Abdomen) DILATATION AND CURETTAGE/HYSTEROSCOPY WITH MINERVA Endometrial Ablation (Vagina )  Patient Location: PACU  Anesthesia Type:General  Level of Consciousness: awake, alert  and oriented  Airway & Oxygen Therapy: Patient Spontanous Breathing and Patient connected to nasal cannula oxygen  Post-op Assessment: Report given to RN, Post -op Vital signs reviewed and stable, Patient moving all extremities X 4 and Patient able to stick tongue midline  Post vital signs: Reviewed  Last Vitals:  Vitals Value Taken Time  BP 122/69 08/31/21 0915  Temp 36.3 C 08/31/21 0915  Pulse 88 08/31/21 0917  Resp 21 08/31/21 0917  SpO2 97 % 08/31/21 0917  Vitals shown include unvalidated device data.  Last Pain:  Vitals:   08/31/21 0712  TempSrc: Oral  PainSc: 0-No pain         Complications: No notable events documented.

## 2021-08-31 NOTE — Anesthesia Postprocedure Evaluation (Signed)
Anesthesia Post Note  Patient: Emily Morales  Procedure(s) Performed: LAPAROSCOPIC BILATERAL SALPINGECTOMY (Bilateral: Abdomen) DILATATION AND CURETTAGE/HYSTEROSCOPY WITH MINERVA Endometrial Ablation (Vagina )  Patient location during evaluation: Phase II Anesthesia Type: General Level of consciousness: awake and alert and oriented Pain management: pain level controlled Vital Signs Assessment: post-procedure vital signs reviewed and stable Respiratory status: spontaneous breathing, nonlabored ventilation and respiratory function stable Cardiovascular status: blood pressure returned to baseline and stable Postop Assessment: no apparent nausea or vomiting Anesthetic complications: no   No notable events documented.   Last Vitals:  Vitals:   08/31/21 0945 08/31/21 1008  BP: 124/88 118/79  Pulse: 88 73  Resp: 20 18  Temp:  36.4 C  SpO2: 100% 100%    Last Pain:  Vitals:   08/31/21 1008  TempSrc: Oral  PainSc: 6                  Ermal Brzozowski C Lorrain Rivers

## 2021-08-31 NOTE — Anesthesia Procedure Notes (Signed)
Procedure Name: Intubation Date/Time: 08/31/2021 7:45 AM Performed by: Maude Leriche, CRNA Pre-anesthesia Checklist: Patient identified, Emergency Drugs available, Suction available and Patient being monitored Patient Re-evaluated:Patient Re-evaluated prior to induction Oxygen Delivery Method: Circle system utilized Preoxygenation: Pre-oxygenation with 100% oxygen Induction Type: IV induction Ventilation: Mask ventilation without difficulty Laryngoscope Size: Miller and 2 Grade View: Grade I Tube type: Oral Tube size: 7.0 mm Number of attempts: 1 Airway Equipment and Method: Stylet and Bite block Placement Confirmation: ETT inserted through vocal cords under direct vision, positive ETCO2 and breath sounds checked- equal and bilateral Secured at: 21 cm Tube secured with: Tape Dental Injury: Teeth and Oropharynx as per pre-operative assessment

## 2021-08-31 NOTE — Op Note (Signed)
Preoperative diagnosis:  1.   menometrorrhgia                                         2.  dysmenorrhea   Postoperative diagnoses: Same as above   Procedure: Hysteroscopy, uterine curettage, endometrial ablation using Minerva  Surgeon: Florian Buff   Anesthesia: Laryngeal mask airway  Findings: The endometrium was normal. There were no fibroid or other abnormalities.  Description of operation: The patient was taken to the operating room and placed in the supine position. She underwent general anesthesia using the laryngeal mask airway. She was placed in the dorsal lithotomy position and prepped and draped in the usual sterile fashion. A Graves speculum was placed and the anterior cervical lip was grasped with a single-tooth tenaculum. The cervix was dilated serially to allow passage of the hysteroscope. Diagnostic hysteroscopy was performed and was found to be normal. A vigorous uterine curettage was then performed and all tissue sent to pathology for evaluation.  I then proceeded to perform the Minerva endometrial ablation.   The uterus sounded to 10 cm The handpiece was attached to the Minerva power source/machine and the handpiece passed the checklist. The array was squeezed down to remove all of the air present.  The array was then place into the endometrial cavity and deployed to a length of 6.5 cm. The handpiece confirmed appropriate width by being in the green portion of the visual dial. The cervical cuff was then inflated to the point the CO2 indicator was in the green. The endometrial integrity check was then performed and integrity sequence was confirmed x 2. The heating was then begun and carried out for a total of 2 minutes(which is standard therapy time). When the plasma cycle was finished,  the cervical cuff was deflated and the array was removed with tissue present on the silicon membrane. There was appropriate post Minerva bleeding and uterine discharge.     All of the  equipment worked well throughout the procedure.  The patient was awakened from anesthesia and taken to the recovery room in good stable condition all counts were correct. She received 2 g of Ancef and 30 mg of Toradol preoperatively. She will be discharged from the recovery room and followed up in the office in 1- 2 weeks.   Her IUD was removed prior to and replaced at the end of the procedure  She can expect 4 weeks of post procedure bloody watery discharge  Florian Buff, MD  08/31/2021 9:22 AM   Preoperative Diagnosis:  1.  Multiparous female desires permanent sterilization                                          2.  Elects to have bilateral salpingectomy for ovarian cancer prophylaxis  Postoperative Diagnosis:  Same as above  Procedure:  Laparoscopic Bilateral Salpingectomy for the purpose of permanent sterilization  Surgeon:  Jacelyn Grip MD  Anaesthesia: general  Findings:  Patient had normal pelvic anatomy and no intraperitoneal abnormalities.  Description of Operation:  Patient was taken to the OR and placed into supine position where she underwent general anaesthesia.   She was placed in the dorsal lithotomy position and prepped and draped in the usual sterile fashion.   An incision was  made superior to the umbilicus and dissection taken down to the rectus fascia. A Veres needle was used to insufflate the periotneal cavity. An 11 mm non bladed video laparoscope trocar was then placed under direct visualization without difficulty.   The above noted findings were observed.   Two additional 5 mm non bladed trocars were placed in the right and left lower quadrants under direct visualization without difficulty.   The Harmonic scalpel was employed and a salpingectomy of both the right and left tubes was performed.   The tubes were removed from the peritoneal cavity and sent to pathology.   There was good hemostasis bilaterally.   The fascia, peritoneum and subcutaneous tissue  were closed using 0 vicryl.   All 3 skin incisions were closed using 3-0 vicryl in a subcuticular fashion.  Exparel 266 mg 20 cc was injected in the 3 incisional/trocar sites.  The patient was awakened from anaesthesia and taken to the PACU with all counts being correct x 3.   The patient received  2 gram of ancef andToradol 30 mg IV preoperatively.  Florian Buff 08/31/2021 9:23 AM

## 2021-09-01 ENCOUNTER — Encounter (HOSPITAL_COMMUNITY): Payer: Self-pay | Admitting: Obstetrics & Gynecology

## 2021-09-01 ENCOUNTER — Encounter: Payer: Self-pay | Admitting: Obstetrics & Gynecology

## 2021-09-01 LAB — SURGICAL PATHOLOGY

## 2021-09-05 ENCOUNTER — Encounter: Payer: Self-pay | Admitting: Obstetrics & Gynecology

## 2021-09-12 ENCOUNTER — Encounter: Payer: Self-pay | Admitting: Obstetrics & Gynecology

## 2021-09-12 ENCOUNTER — Telehealth (INDEPENDENT_AMBULATORY_CARE_PROVIDER_SITE_OTHER): Payer: 59 | Admitting: Obstetrics & Gynecology

## 2021-09-12 DIAGNOSIS — Z9889 Other specified postprocedural states: Secondary | ICD-10-CM

## 2021-09-12 NOTE — Progress Notes (Signed)
This was a MyChart video visit Patient was at work I was in my office Total time 10 minutes   HPI: Patient returns for routine postoperative follow-up having undergone Laparoscopic bilateral salpingectomy and hysteroscopy uterine curettage Minerva endometrial ablation on 08/31/21.  The patient's immediate postoperative recovery has been unremarkable. Since hospital discharge the patient reports unremarkable.   Current Outpatient Medications: ASHWAGANDHA PO, Take 2 capsules by mouth daily., Disp: , Rfl:  clindamycin (CLEOCIN T) 1 % lotion, Apply topically 2 (two) times daily., Disp: 60 mL, Rfl: 0 cyclobenzaprine (FLEXERIL) 10 MG tablet, Take 10 mg by mouth 2 (two) times daily as needed for muscle spasms., Disp: , Rfl:  FLUoxetine (PROZAC) 20 MG capsule, Take 20 mg by mouth daily., Disp: , Rfl:   hydrOXYzine (ATARAX/VISTARIL) 25 MG tablet, Take 1 tablet (25 mg total) by mouth 3 (three) times daily as needed for anxiety., Disp: 60 tablet, Rfl: 0 Iron-FA-B Cmp-C-Biot-Probiotic (FUSION PLUS) CAPS, Take 1 daily, Disp: 44 capsule, Rfl: 0 levonorgestrel (LILETTA) 20.1 MCG/DAY IUD, 1 each by Intrauterine route once., Disp: , Rfl:  LORazepam (ATIVAN) 0.5 MG tablet, Take 0.5 mg by mouth 3 (three) times daily as needed for anxiety., Disp: , Rfl:  albuterol (VENTOLIN HFA) 108 (90 Base) MCG/ACT inhaler, Inhale 2 puffs into the lungs every 6 (six) hours as needed for wheezing or shortness of breath., Disp: 8 g, Rfl: 0 EPINEPHrine 0.3 mg/0.3 mL IJ SOAJ injection, Inject 0.3 mg into the muscle as needed for anaphylaxis. (Patient not taking: Reported on 09/12/2021), Disp: , Rfl:  HYDROcodone-acetaminophen (NORCO/VICODIN) 5-325 MG tablet, Take 1-2 tablets by mouth every 6 (six) hours as needed., Disp: 15 tablet, Rfl: 0 ketorolac (TORADOL) 10 MG tablet, Take 1 tablet (10 mg total) by mouth every 8 (eight) hours as needed., Disp: 15 tablet, Rfl: 0 ondansetron (ZOFRAN-ODT) 8 MG disintegrating tablet, Take 1 tablet  (8 mg total) by mouth every 8 (eight) hours as needed for nausea or vomiting., Disp: 8 tablet, Rfl: 0  No current facility-administered medications for this visit.    There were no vitals taken for this visit.  Physical Exam: Gen WDWN NAD  Diagnostic Tests:   Pathology: benign  Impression: Normal post op course   Plan: No sex til discharge stops    Follow up: prn   Florian Buff, MD

## 2021-09-15 ENCOUNTER — Other Ambulatory Visit: Payer: Self-pay

## 2021-09-15 ENCOUNTER — Other Ambulatory Visit (HOSPITAL_COMMUNITY): Payer: Self-pay | Admitting: Otolaryngology

## 2021-09-15 ENCOUNTER — Ambulatory Visit (HOSPITAL_COMMUNITY)
Admission: RE | Admit: 2021-09-15 | Discharge: 2021-09-15 | Disposition: A | Discharge status: Home / Self Care | Payer: 59 | Source: Ambulatory Visit | Attending: Internal Medicine | Admitting: Internal Medicine

## 2021-09-15 ENCOUNTER — Other Ambulatory Visit (HOSPITAL_COMMUNITY): Payer: Self-pay | Admitting: Internal Medicine

## 2021-09-15 DIAGNOSIS — M79661 Pain in right lower leg: Secondary | ICD-10-CM | POA: Diagnosis not present

## 2021-10-04 ENCOUNTER — Other Ambulatory Visit: Payer: Self-pay

## 2021-10-04 ENCOUNTER — Other Ambulatory Visit: Payer: Self-pay | Admitting: Physician Assistant

## 2021-10-04 DIAGNOSIS — L700 Acne vulgaris: Secondary | ICD-10-CM

## 2021-10-04 MED ORDER — HYDROXYZINE HCL 25 MG PO TABS: 25.0000 mg | ORAL_TABLET | Freq: Three times a day (TID) | ORAL | 1 refills | Status: DC | PRN

## 2021-10-10 ENCOUNTER — Encounter: Payer: Self-pay | Admitting: Obstetrics & Gynecology

## 2021-10-13 ENCOUNTER — Ambulatory Visit (INDEPENDENT_AMBULATORY_CARE_PROVIDER_SITE_OTHER): Payer: 59 | Admitting: Obstetrics & Gynecology

## 2021-10-13 ENCOUNTER — Other Ambulatory Visit: Payer: Self-pay

## 2021-10-13 ENCOUNTER — Encounter: Payer: Self-pay | Admitting: Obstetrics & Gynecology

## 2021-10-13 VITALS — BP 114/80 | HR 70 | Ht 65.0 in | Wt 221.0 lb

## 2021-10-13 NOTE — Progress Notes (Signed)
? ? ?  Blood pressure 114/80, pulse 70, height '5\' 5"'$  (1.651 m), weight 221 lb (100.2 kg). ? ?Chief Complaint  ?Patient presents with  ? Pelvic Pain  ? ?Began having an episodic radiating pain in the LLQ a few weeks after surgery ?Just above the inguinal ligament, starts laterally and radiates medially ?Recent laparoscopic incision is clean dry ? ?Abdominal wall exam is nomrla ? ?Impr Left ilioinguinal neuralgia ?Trigger Point Injection  ? ?Pre-operative diagnosis:  ?Left ilioinguinal neuralgia ? ?Post-operative diagnosis:  ?SAA with complete resolution ? ?After risks and benefits were explained including bleeding, infection, worsening of the pain, damage to the area being injected, weakness, allergic reaction to medications, vascular injection, and nerve damage, signed consent was obtained.  All questions were answered.   ? ?The area of the trigger point was identified and the skin prepped three times with alcohol and the alcohol allowed to dry.  Next, a 25 gauge 1.5 inch needle was placed in the area of the trigger point.  Once reproduction of the pain was elicited and negative aspiration confirmed, the trigger point was injected and the needle removed.   ? ?The patient did tolerate the procedure well and there were not complications.   ? ?Medication used:  10 cc 0.5% marcain plain ? ?Trigger points injected: 1   ? ?Trigger point(s) location(s):  left ? ?  ICD-10-CM   ?1. Ilioinguinal neuralgia of left side  G57.92   ? Injected with 10 cc 0.5 % marcain with resolution of the radiating disomfort  ?  ? ?Recommend topical capsaicin prn  ?

## 2021-11-02 ENCOUNTER — Telehealth: Payer: 59 | Admitting: Physician Assistant

## 2021-11-02 DIAGNOSIS — J302 Other seasonal allergic rhinitis: Secondary | ICD-10-CM

## 2021-11-02 MED ORDER — AZELASTINE HCL 0.1 % NA SOLN: 1.0000 | Freq: Two times a day (BID) | NASAL | 0 refills | Status: DC

## 2021-11-02 NOTE — Progress Notes (Signed)
I have spent 5 minutes in review of e-visit questionnaire, review and updating patient chart, medical decision making and response to patient.   Gola Bribiesca Cody Peirce Deveney, PA-C    

## 2021-11-02 NOTE — Progress Notes (Signed)
E visit for Allergic Rhinitis ?We are sorry that you are not feeling well.  Here is how we plan to help! ? ?Based on what you have shared with me it looks like you have Allergic Rhinitis.  Rhinitis is when a reaction occurs that causes nasal congestion, runny nose, sneezing, and itching.  Most types of rhinitis are caused by an inflammation and are associated with symptoms in the eyes ears or throat. ?There are several types of rhinitis.  The most common are acute rhinitis, which is usually caused by a viral illness, allergic or seasonal rhinitis, and nonallergic or year-round rhinitis.  Nasal allergies occur certain times of the year.  Allergic rhinitis is caused when allergens in the air trigger the release of histamine in the body.  Histamine causes itching, swelling, and fluid to build up in the fragile linings of the nasal passages, sinuses and eyelids.  An itchy nose and clear discharge are common. ? ?I recommend the following over the counter treatments: ?You should take a daily dose of antihistamine and Xyzal 5 mg take 1 tablet daily ? ?I also would recommend a nasal spray: ?Saline 1 spray into each nostril as needed. Continue your OTC nasal steroid (Flonase, Nasacort, Nasonex). I will add on a prescription nasal spray (Astelin) to use twice daily. ? ?You may also benefit from eye drops such as: ?I recommend OTC [place] eye drops.  ? ?HOME CARE: ? ?You can use an over-the-counter saline nasal spray as needed ?Avoid areas where there is heavy dust, mites, or molds ?Stay indoors on windy days during the pollen season ?Keep windows closed in home, at least in bedroom; use air conditioner. ?Use high-efficiency house air filter ?Keep windows closed in car, turn [facility] on re-circulate ?Avoid playing out with dog during pollen season ? ?GET HELP RIGHT AWAY IF: ? ?If your symptoms do not improve within 10 days ?You become short of breath ?You develop yellow or green discharge from your nose for over 3 days ?You have  coughing fits ? ?MAKE SURE YOU: ? ?Understand these instructions ?Will watch your condition ?Will get help right away if you are not doing well or get worse ? ?Thank you for choosing an e-visit. ?Your e-visit answers were reviewed by a board certified advanced clinical practitioner to complete your personal care plan. Depending upon the condition, your plan could have included both over the counter or prescription medications. ?Please review your pharmacy choice. Be sure that the pharmacy you have chosen is open so that you can pick up your prescription now.  If there is a problem you may message your provider in [place] to have the prescription routed to another pharmacy. ?Your safety is important to [place]. If you have drug allergies check your prescription carefully.  ?For the next 24 hours, you can use MyChart to ask questions about today?s visit, request a non-urgent call back, or ask for a work or school excuse from your e-visit provider. ?You will get an email in the next two days asking about your experience. I hope that your e-visit has been valuable and will speed your recovery. ? ? ? ? ? ? ? ?

## 2021-11-18 ENCOUNTER — Telehealth: Payer: 59 | Admitting: Physician Assistant

## 2021-11-18 DIAGNOSIS — H60391 Other infective otitis externa, right ear: Secondary | ICD-10-CM | POA: Diagnosis not present

## 2021-11-18 MED ORDER — NEOMYCIN-POLYMYXIN-HC 3.5-10000-1 OT SOLN: 3.0000 [drp] | Freq: Four times a day (QID) | OTIC | 0 refills | Status: DC

## 2021-11-18 NOTE — Progress Notes (Signed)
E Visit for Swimmer's Ear  We are sorry that you are not feeling well. Here is how we plan to help!  Based on what you have shared with me it looks like you have swimmers ear. Swimmer's ear is a redness or swelling, irritation, or infection of your outer ear canal.  These symptoms usually occur within a few days of swimming.  Your ear canal is a tube that goes from the opening of the ear to the eardrum.  When water stays in your ear canal, germs can grow.  This is a painful condition that often happens to children and swimmers of all ages.  It is not contagious and oral antibiotics are not required to treat uncomplicated swimmer's ear.  The usual symptoms include: Itching inside the ear, Redness or a sense of swelling in the ear, Pain when the ear is tugged on when pressure is placed on the ear, Pus draining from the infected ear. and I have prescribed: Neomycin 0.35%, polymyxin B 10,000 units/mL, and hydrocortisone 0,5% otic solution 4 drops in affected ears four times a day for 7 days  In certain cases swimmer's ear may progress to a more serious bacterial infection of the middle or inner ear.  If you have a fever 102 and up and significantly worsening symptoms, this could indicate a more serious infection moving to the middle/inner and needs face to face evaluation in an office by a provider.  Your symptoms should improve over the next 3 days and should resolve in about 7 days.  HOME CARE:  Wash your hands frequently. Do not place the tip of the bottle on your ear or touch it with your fingers. You can take Acetominophen 650 mg every 4-6 hours as needed for pain.  If pain is severe or moderate, you can apply a heating pad (set on low) or hot water bottle (wrapped in a towel) to outer ear for 20 minutes.  This will also increase drainage. Avoid ear plugs Do not use Q-tips After showers, help the water run out by tilting your head to one side.  GET HELP RIGHT AWAY IF:  Fever is over 102.2  degrees. You develop progressive ear pain or hearing loss. Ear symptoms persist longer than 3 days after treatment.  MAKE SURE YOU:  Understand these instructions. Will watch your condition. Will get help right away if you are not doing well or get worse.  TO PREVENT SWIMMER'S EAR: Use a bathing cap or custom fitted swim molds to keep your ears dry. Towel off after swimming to dry your ears. Tilt your head or pull your earlobes to allow the water to escape your ear canal. If there is still water in your ears, consider using a hairdryer on the lowest setting.   Thank you for choosing an e-visit.  Your e-visit answers were reviewed by a board certified advanced clinical practitioner to complete your personal care plan. Depending upon the condition, your plan could have included both over the counter or prescription medications.  Please review your pharmacy choice. Make sure the pharmacy is open so you can pick up prescription now. If there is a problem, you may contact your provider through MyChart messaging and have the prescription routed to another pharmacy.  Your safety is important to us. If you have drug allergies check your prescription carefully.   For the next 24 hours you can use MyChart to ask questions about today's visit, request a non-urgent call back, or ask for a work or   school excuse. You will get an email in the next two days asking about your experience. I hope that your e-visit has been valuable and will speed your recovery.  I provided 5 minutes of non face-to-face time during this encounter for chart review and documentation.   

## 2021-11-25 ENCOUNTER — Other Ambulatory Visit: Payer: Self-pay | Admitting: Obstetrics & Gynecology

## 2021-11-29 ENCOUNTER — Other Ambulatory Visit: Payer: Self-pay | Admitting: Obstetrics & Gynecology

## 2021-11-29 ENCOUNTER — Other Ambulatory Visit: Payer: Self-pay | Admitting: Physician Assistant

## 2021-11-29 DIAGNOSIS — L700 Acne vulgaris: Secondary | ICD-10-CM

## 2021-12-09 ENCOUNTER — Telehealth: Payer: 59 | Admitting: Family Medicine

## 2021-12-09 DIAGNOSIS — B9689 Other specified bacterial agents as the cause of diseases classified elsewhere: Secondary | ICD-10-CM

## 2021-12-09 DIAGNOSIS — N760 Acute vaginitis: Secondary | ICD-10-CM

## 2021-12-09 MED ORDER — METRONIDAZOLE 500 MG PO TABS: 500.0000 mg | ORAL_TABLET | Freq: Two times a day (BID) | ORAL | 0 refills | Status: AC

## 2021-12-09 MED ORDER — METRONIDAZOLE 500 MG PO TABS: 500.0000 mg | ORAL_TABLET | Freq: Three times a day (TID) | ORAL | 0 refills | Status: DC

## 2021-12-09 NOTE — Progress Notes (Signed)

## 2021-12-16 NOTE — Progress Notes (Signed)
I have provided 5 minutes of non face to face time during this encounter for chart review and documentation.   

## 2022-02-05 ENCOUNTER — Encounter: Payer: 59 | Admitting: Physician Assistant

## 2022-02-06 NOTE — Progress Notes (Signed)
Patient's son had video appt for same thing. Expect e-visit was for son, not patient. Marked erroneous

## 2022-02-28 ENCOUNTER — Other Ambulatory Visit: Payer: Self-pay | Admitting: Family Medicine

## 2022-02-28 ENCOUNTER — Other Ambulatory Visit (HOSPITAL_COMMUNITY): Payer: Self-pay | Admitting: Family Medicine

## 2022-03-20 ENCOUNTER — Other Ambulatory Visit (HOSPITAL_COMMUNITY): Payer: Self-pay | Admitting: Family Medicine

## 2022-03-20 ENCOUNTER — Other Ambulatory Visit: Payer: Self-pay | Admitting: Family Medicine

## 2022-03-20 DIAGNOSIS — K429 Umbilical hernia without obstruction or gangrene: Secondary | ICD-10-CM

## 2022-04-05 ENCOUNTER — Ambulatory Visit (HOSPITAL_COMMUNITY)
Admission: RE | Admit: 2022-04-05 | Discharge: 2022-04-05 | Disposition: A | Discharge status: Home / Self Care | Payer: 59 | Source: Ambulatory Visit | Attending: Family Medicine | Admitting: Family Medicine

## 2022-04-05 DIAGNOSIS — K429 Umbilical hernia without obstruction or gangrene: Secondary | ICD-10-CM | POA: Insufficient documentation

## 2022-04-06 ENCOUNTER — Other Ambulatory Visit: Payer: Self-pay | Admitting: Family Medicine

## 2022-04-06 ENCOUNTER — Other Ambulatory Visit (HOSPITAL_COMMUNITY): Payer: Self-pay | Admitting: Family Medicine

## 2022-04-06 DIAGNOSIS — Z9889 Other specified postprocedural states: Secondary | ICD-10-CM

## 2022-04-06 DIAGNOSIS — K429 Umbilical hernia without obstruction or gangrene: Secondary | ICD-10-CM

## 2022-04-19 ENCOUNTER — Ambulatory Visit (HOSPITAL_COMMUNITY)
Admission: RE | Admit: 2022-04-19 | Discharge: 2022-04-19 | Disposition: A | Discharge status: Home / Self Care | Payer: 59 | Source: Ambulatory Visit | Attending: Family Medicine | Admitting: Family Medicine

## 2022-04-19 ENCOUNTER — Encounter (HOSPITAL_COMMUNITY): Payer: Self-pay

## 2022-04-19 DIAGNOSIS — Z9889 Other specified postprocedural states: Secondary | ICD-10-CM | POA: Diagnosis not present

## 2022-04-19 MED ORDER — IOHEXOL 300 MG/ML  SOLN
100.0000 mL | Freq: Once | INTRAMUSCULAR | Status: AC | PRN
  Administered 2022-04-19: 80 mL via INTRAVENOUS

## 2022-04-24 ENCOUNTER — Telehealth (INDEPENDENT_AMBULATORY_CARE_PROVIDER_SITE_OTHER): Payer: 59 | Admitting: General Surgery

## 2022-04-24 DIAGNOSIS — Q438 Other specified congenital malformations of intestine: Secondary | ICD-10-CM | POA: Insufficient documentation

## 2022-04-24 NOTE — Telephone Encounter (Signed)
Call placed to patient and patient made aware.   Agreeable to cancelling appointment at this time. Message also routed to Cecile Sheerer, NP for review.

## 2022-04-24 NOTE — Telephone Encounter (Signed)
[facility]  Patient referred to make an appt for a CT read from 9/20.  CT report is calling a herniation of sigmoid colon through mesentery / paradunodenal.  I have had two senior radiologist, Dr. Thornton Papas and Dr. Jobe Igo look at this CT and we do not see this being the case.  The sigmoid colon is redundant and gas filled, it is not obstructed. She had a CT from a few years prior with normal findings of a redundant sigmoid but not herniation (it was not gas filled on that CT).    The sigmoid colon is laying over the small bowel and lateral to the small bowel as it courses up and comes back down. It is not volvulized or herniating through small bowel or colonic mesentery.   I have sent Dr. Erlinda Hong who read the Ct a message to see if she will look at the Ct again.  Right now there is no indication for any surgical intervention. The patient does have a redundant sigmoid and this was distended on that day. I recommend keeping her stools soft and regular. I do not think she needs any exploration or surgery or further imaging at this time after speaking with the radiologist above.  If she is having GI issues, she may need to be referred to GI for further evaluation.   The patient is at risk in the future for sigmoid volvulus given the redundancy but there is nothing to do to prevent this surgically.   If  Dr. Erlinda Hong recommends, something else or is adamant, we may have to do further imaging, but I will hold for now.  I do not think she needs an appt with [place] at this time.   Curlene Labrum, MD [facility] [address] [place], [place] [address] [phone] (office)

## 2022-05-11 ENCOUNTER — Ambulatory Visit: Payer: Self-pay | Admitting: General Surgery

## 2022-05-13 ENCOUNTER — Other Ambulatory Visit: Payer: Self-pay | Admitting: Physician Assistant

## 2022-05-13 DIAGNOSIS — L700 Acne vulgaris: Secondary | ICD-10-CM

## 2022-09-12 ENCOUNTER — Other Ambulatory Visit (HOSPITAL_COMMUNITY)
Admission: RE | Admit: 2022-09-12 | Discharge: 2022-09-12 | Disposition: A | Discharge status: Home / Self Care | Payer: 59 | Source: Ambulatory Visit | Attending: Women's Health | Admitting: Women's Health

## 2022-09-12 ENCOUNTER — Encounter: Payer: Self-pay | Admitting: Women's Health

## 2022-09-12 ENCOUNTER — Ambulatory Visit (INDEPENDENT_AMBULATORY_CARE_PROVIDER_SITE_OTHER): Payer: 59 | Admitting: Women's Health

## 2022-09-12 VITALS — BP 120/80 | HR 96 | Ht 66.0 in | Wt 226.0 lb

## 2022-09-12 DIAGNOSIS — N3000 Acute cystitis without hematuria: Secondary | ICD-10-CM | POA: Diagnosis not present

## 2022-09-12 DIAGNOSIS — N898 Other specified noninflammatory disorders of vagina: Secondary | ICD-10-CM | POA: Diagnosis not present

## 2022-09-12 DIAGNOSIS — R1033 Periumbilical pain: Secondary | ICD-10-CM

## 2022-09-12 NOTE — Progress Notes (Signed)
GYN VISIT Patient name: Emily Morales MRN [id number]  Date of birth: 1990/11/17 Chief Complaint:   Abdominal Pain  History of Present Illness:   Emily Morales is a 32 y.o. G64P2002 African-American female being seen today for report of abdominal pain. Supraumbilical since after hysteroscopy/uterine curettae/endometrial ablation/bilateral salpingectomy and IUD removal/reinsertion for 'cervical cancer prevention per pt' on 08/31/21. Did have umbilical hernia repair on 04/04/19 with Dr. Constance Haw. States she feels a lump in this area. Had abd u/s on 04/05/22 and CT abd on 04/19/22 to evaluate, u/s was normal and CT initially showed internal paraduodenal herniation of gas-filled sigmoid colon w/o current finding of bowel obstruction, addendum reads this was not seen on review. Also reports suprapubic pain, lower back pain- works at [employer] and gave urine sample yesterday indicative of UTI, so was given an injection of Rocephin, last night had bad pain w/ urination. Mucousy vaginal d/c since ablation, happens randomly. No odor/itching/irritation.  No LMP recorded. (Menstrual status: IUD). The current method of family planning is IUD and tubal ligation.  Last pap 11/11/20. Results were: NILM w/ HRHPV negative     11/11/2020    8:46 AM 09/15/2020    8:51 AM 06/01/2020    5:29 PM 01/05/2020    5:00 PM 09/30/2019   10:55 AM  Depression screen PHQ 2/9  Decreased Interest 3 0 0 0 0  Down, Depressed, Hopeless 1 0 0 0 0  PHQ - 2 Score 4 0 0 0 0  Altered sleeping 3  3 0 0  Tired, decreased energy 2  0 0 0  Change in appetite 3  3 0 0  Feeling bad or failure about yourself  0  0 0 0  Trouble concentrating 3  0 0 0  Moving slowly or fidgety/restless 0  0 0 0  Suicidal thoughts 0  0 0 0  PHQ-9 Score 15  6 0 0  Difficult doing work/chores    Not difficult at all Not difficult at all        11/11/2020    8:47 AM 09/15/2020    8:50 AM 12/13/2018   12:59 PM 08/15/2018   10:04 AM  GAD 7 :  Generalized Anxiety Score  Nervous, Anxious, on Edge 3 0 0 3  Control/stop worrying 3 0 3 1  Worry too much - different things 3 0 3 2  Trouble relaxing 3 0 2 3  Restless 3 0 0 1  Easily annoyed or irritable 3 0 3 3  Afraid - awful might happen 1 0 1 2  Total GAD 7 Score 19 0 12 15  Anxiety Difficulty  Not difficult at all Very difficult      Review of Systems:   Pertinent items are noted in HPI Denies fever/chills, dizziness, headaches, visual disturbances, fatigue, shortness of breath, chest pain, abdominal pain, vomiting, abnormal vaginal discharge/itching/odor/irritation, problems with periods, bowel movements, urination, or intercourse unless otherwise stated above.  Pertinent History Reviewed:  Reviewed past medical,surgical, social, obstetrical and family history.  Reviewed problem list, medications and allergies. Physical Assessment:   Vitals:   09/12/22 0857  BP: 120/80  Pulse: 96  Weight: 226 lb (102.5 kg)  Height: 5' 6"$  (1.676 m)  Body mass index is 36.48 kg/m.       Physical Examination:   General appearance: alert, well appearing, and in no distress  Mental status: alert, oriented to person, place, and time  Skin: warm & dry   Cardiovascular: normal heart rate  noted  Respiratory: normal respiratory effort, no distress  Abdomen: soft, tenderness in supraumbilical region, no obvious hernia on exam; remainder of abdomen soft and nontender  Pelvic: VULVA: normal appearing vulva with no masses, tenderness or lesions, VAGINA: normal appearing vagina with normal color and moderate amount mucoid discharge, no lesions, +anterior vaginal wall/bladder pain c/w current UTI CERVIX: normal appearing cervix without discharge or lesions, Liletta IUD strings visible and appropriate length UTERUS: uterus is normal size, shape, consistency and nontender, ADNEXA: normal adnexa in size, nontender and no masses.   Extremities: no edema   Chaperone: Latisha Cresenzo    No results  found for this or any previous visit (from the past 24 hour(s)).  Assessment & Plan:  1) Supraumbilical pain> s/p umbilical hernia repair [id number], no evidence of hernia on recent imaging, however area very tender. Recommended making appt w/ Dr. Constance Haw to evaluate  2) Current UTI> per urine dipstick at work yesterday, was given injection of Rocephin. If not improving, can have her office sent cx or return here for nurse visit  3) Vaginal d/c> CV swab sent  Meds: No orders of the defined types were placed in this encounter.   No orders of the defined types were placed in this encounter.   Return in about 1 year (around 09/13/2023) for Pap & physical.  Roma Schanz CNM, [facility] 09/12/2022 9:47 AM

## 2022-09-13 LAB — CERVICOVAGINAL ANCILLARY ONLY
Bacterial Vaginitis (gardnerella): NEGATIVE
Candida Glabrata: NEGATIVE
Candida Vaginitis: NEGATIVE
Chlamydia: NEGATIVE
Comment: NEGATIVE
Comment: NEGATIVE
Comment: NEGATIVE
Comment: NEGATIVE
Comment: NEGATIVE
Comment: NORMAL
Neisseria Gonorrhea: NEGATIVE
Trichomonas: NEGATIVE

## 2022-11-23 ENCOUNTER — Telehealth: Payer: 59 | Admitting: Physician Assistant

## 2022-11-23 DIAGNOSIS — J069 Acute upper respiratory infection, unspecified: Secondary | ICD-10-CM | POA: Diagnosis not present

## 2022-11-24 MED ORDER — FLUTICASONE PROPIONATE 50 MCG/ACT NA SUSP: 2.0000 | Freq: Every day | NASAL | 0 refills | Status: DC

## 2022-11-24 MED ORDER — BENZONATATE 100 MG PO CAPS: 100.0000 mg | ORAL_CAPSULE | Freq: Three times a day (TID) | ORAL | 0 refills | Status: DC | PRN

## 2022-11-24 NOTE — Progress Notes (Signed)
E-Visit for Upper Respiratory Infection   We are sorry you are not feeling well.  Here is how we plan to help!  Based on what you have shared with me, it looks like you may have a viral upper respiratory infection.  Upper respiratory infections are caused by a large number of viruses; however, rhinovirus is the most common cause.   Symptoms vary from person to person, with common symptoms including sore throat, cough, fatigue or lack of energy and feeling of general discomfort.  A low-grade fever of up to 100.4 may present, but is often uncommon.  Symptoms vary however, and are closely related to a person's age or underlying illnesses.  The most common symptoms associated with an upper respiratory infection are nasal discharge or congestion, cough, sneezing, headache and pressure in the ears and face.  These symptoms usually persist for about 3 to 10 days, but can last up to 2 weeks.  It is important to know that upper respiratory infections do not cause serious illness or complications in most cases.    Upper respiratory infections can be transmitted from person to person, with the most common method of transmission being a person's hands.  The virus is able to live on the skin and can infect other persons for up to 2 hours after direct contact.  Also, these can be transmitted when someone coughs or sneezes; thus, it is important to cover the mouth to reduce this risk.  To keep the spread of the illness at bay, good hand hygiene is very important.  This is an infection that is most likely caused by a virus. There are no specific treatments other than to help you with the symptoms until the infection runs its course.  We are sorry you are not feeling well.  Here is how we plan to help!   For nasal congestion, you may use an oral decongestants such as Mucinex D or if you have glaucoma or high blood pressure use plain Mucinex.  Saline nasal spray or nasal drops can help and can safely be used as often as  needed for congestion.  For your congestion, I have prescribed Fluticasone nasal spray one spray in each nostril twice a day  If you do not have a history of heart disease, hypertension, diabetes or thyroid disease, prostate/bladder issues or glaucoma, you may also use Sudafed to treat nasal congestion.  It is highly recommended that you consult with a pharmacist or your primary care physician to ensure this medication is safe for you to take.     If you have a cough, you may use cough suppressants such as Delsym and Robitussin.  If you have glaucoma or high blood pressure, you can also use Coricidin HBP.   For cough I have prescribed for you A prescription cough medication called Tessalon Perles 100 mg. You may take 1-2 capsules every 8 hours as needed for cough  If you have a sore or scratchy throat, use a saltwater gargle-  to  teaspoon of salt dissolved in a 4-ounce to 8-ounce glass of warm water.  Gargle the solution for approximately 15-30 seconds and then spit.  It is important not to swallow the solution.  You can also use throat lozenges/cough drops and Chloraseptic spray to help with throat pain or discomfort.  Warm or cold liquids can also be helpful in relieving throat pain.  For headache, pain or general discomfort, you can use Ibuprofen or Tylenol as directed.   Some authorities believe   that zinc sprays or the use of Echinacea may shorten the course of your symptoms.  I do recommend for you to take an at home Covid 19 test.  HOME CARE Only take medications as instructed by your medical team. Be sure to drink plenty of fluids. Water is fine as well as fruit juices, sodas and electrolyte beverages. You may want to stay away from caffeine or alcohol. If you are nauseated, try taking small sips of liquids. How do you know if you are getting enough fluid? Your urine should be a pale yellow or almost colorless. Get rest. Taking a steamy shower or using a humidifier may help nasal  congestion and ease sore throat pain. You can place a towel over your head and breathe in the steam from hot water coming from a faucet. Using a saline nasal spray works much the same way. Cough drops, hard candies and sore throat lozenges may ease your cough. Avoid close contacts especially the very young and the elderly Cover your mouth if you cough or sneeze Always remember to wash your hands.   GET HELP RIGHT AWAY IF: You develop worsening fever. If your symptoms do not improve within 10 days You develop yellow or green discharge from your nose over 3 days. You have coughing fits You develop a severe head ache or visual changes. You develop shortness of breath, difficulty breathing or start having chest pain Your symptoms persist after you have completed your treatment plan  MAKE SURE YOU  Understand these instructions. Will watch your condition. Will get help right away if you are not doing well or get worse.  Thank you for choosing an e-visit.  Your e-visit answers were reviewed by a board certified advanced clinical practitioner to complete your personal care plan. Depending upon the condition, your plan could have included both over the counter or prescription medications.  Please review your pharmacy choice. Make sure the pharmacy is open so you can pick up prescription now. If there is a problem, you may contact your provider through [employer] and have the prescription routed to another pharmacy.  Your safety is important to [place]. If you have drug allergies check your prescription carefully.   For the next 24 hours you can use MyChart to ask questions about today's visit, request a non-urgent call back, or ask for a work or school excuse. You will get an email in the next two days asking about your experience. I hope that your e-visit has been valuable and will speed your recovery.  I have spent 5 minutes in review of e-visit questionnaire, review and updating patient  chart, medical decision making and response to patient.   Margaretann Loveless, PA-C

## 2023-01-15 ENCOUNTER — Telehealth: Payer: 59 | Admitting: Physician Assistant

## 2023-01-15 ENCOUNTER — Telehealth: Payer: 59

## 2023-01-15 DIAGNOSIS — N920 Excessive and frequent menstruation with regular cycle: Secondary | ICD-10-CM

## 2023-01-15 NOTE — Progress Notes (Signed)
Canceled EV. Patient going to contact OB/GYN.

## 2023-01-17 ENCOUNTER — Ambulatory Visit: Payer: 59 | Admitting: Adult Health

## 2023-01-17 ENCOUNTER — Other Ambulatory Visit: Payer: Self-pay | Admitting: Adult Health

## 2023-01-17 MED ORDER — MEGESTROL ACETATE 40 MG PO TABS: ORAL_TABLET | ORAL | 1 refills | Status: DC

## 2023-01-17 NOTE — Progress Notes (Signed)
Rx megace??  

## 2023-04-17 ENCOUNTER — Other Ambulatory Visit: Payer: Self-pay | Admitting: Adult Health

## 2023-04-17 MED ORDER — MEGESTROL ACETATE 40 MG PO TABS: ORAL_TABLET | ORAL | 1 refills | Status: DC

## 2023-04-17 NOTE — Progress Notes (Signed)
Refilled megace

## 2023-08-13 ENCOUNTER — Telehealth: Payer: Self-pay | Admitting: Family Medicine

## 2023-08-13 DIAGNOSIS — B3731 Acute candidiasis of vulva and vagina: Secondary | ICD-10-CM

## 2023-08-13 MED ORDER — FLUCONAZOLE 150 MG PO TABS
150.0000 mg | ORAL_TABLET | ORAL | 0 refills | Status: DC
Start: 2023-08-13 — End: 2023-11-07

## 2023-08-13 NOTE — Progress Notes (Signed)

## 2023-08-22 ENCOUNTER — Institutional Professional Consult (permissible substitution): Payer: Medicaid Other | Admitting: Plastic Surgery

## 2023-08-24 ENCOUNTER — Telehealth: Payer: Self-pay | Admitting: Family Medicine

## 2023-08-24 DIAGNOSIS — J069 Acute upper respiratory infection, unspecified: Secondary | ICD-10-CM

## 2023-08-24 MED ORDER — FLUTICASONE PROPIONATE 50 MCG/ACT NA SUSP
2.0000 | Freq: Every day | NASAL | 0 refills | Status: DC
Start: 2023-08-24 — End: 2024-03-10

## 2023-08-24 MED ORDER — BENZONATATE 200 MG PO CAPS: 200.0000 mg | ORAL_CAPSULE | Freq: Two times a day (BID) | ORAL | 0 refills | Status: DC | PRN

## 2023-08-24 NOTE — Progress Notes (Signed)

## 2023-10-19 ENCOUNTER — Other Ambulatory Visit: Payer: Self-pay | Admitting: Adult Health

## 2023-11-07 ENCOUNTER — Other Ambulatory Visit (HOSPITAL_COMMUNITY)
Admission: RE | Admit: 2023-11-07 | Discharge: 2023-11-07 | Disposition: A | Discharge status: Home / Self Care | Source: Ambulatory Visit | Attending: Women's Health | Admitting: Women's Health

## 2023-11-07 ENCOUNTER — Encounter: Payer: Self-pay | Admitting: Women's Health

## 2023-11-07 ENCOUNTER — Ambulatory Visit (INDEPENDENT_AMBULATORY_CARE_PROVIDER_SITE_OTHER): Admitting: Women's Health

## 2023-11-07 VITALS — BP 117/80 | HR 101 | Ht 66.0 in | Wt 214.0 lb

## 2023-11-07 DIAGNOSIS — Z01419 Encounter for gynecological examination (general) (routine) without abnormal findings: Secondary | ICD-10-CM

## 2023-11-07 DIAGNOSIS — Z113 Encounter for screening for infections with a predominantly sexual mode of transmission: Secondary | ICD-10-CM

## 2023-11-07 DIAGNOSIS — Z30430 Encounter for insertion of intrauterine contraceptive device: Secondary | ICD-10-CM

## 2023-11-07 DIAGNOSIS — N926 Irregular menstruation, unspecified: Secondary | ICD-10-CM | POA: Insufficient documentation

## 2023-11-07 MED ORDER — MEGESTROL ACETATE 40 MG PO TABS: ORAL_TABLET | ORAL | 1 refills | Status: DC

## 2023-11-07 NOTE — Progress Notes (Signed)
 WELL-WOMAN EXAMINATION Patient name: Emily Morales MRN [id number]  Date of birth: July 07, 1991 Chief Complaint:   Annual Exam (Still bleeding from IUD, wants removed and start Depo)  History of Present Illness:   Emily Morales is a 33 y.o. G19P2002 African-American female being seen today for a routine well-woman exam. 08/31/21 ablation and IUD removal/reinsertion. Sometimes bleeds x , sometimes no period for 2 months. This period has been on x 3wks. 1st day cramping. No clots. Divorced x 68yr, wants full STD screen, husband was having affair and found out she has HSV. Has never had any lesions.   PCP: [facility]      No LMP recorded. (Menstrual status: IUD). The current method of family planning is IUD.  Last pap 11/11/20. Results were: NILM w/ HRHPV negative. H/O abnormal pap: yes Last mammogram: never. Results were: N/A. Family h/o breast cancer: no Last colonoscopy: never. Results were: N/A. Family h/o colorectal cancer: no     11/07/2023    1:31 PM 11/11/2020    8:46 AM 09/15/2020    8:51 AM 06/01/2020    5:29 PM 01/05/2020    5:00 PM  Depression screen PHQ 2/9  Decreased Interest 2 3 0 0 0  Down, Depressed, Hopeless 2 1 0 0 0  PHQ - 2 Score 4 4 0 0 0  Altered sleeping 3 3  3  0  Tired, decreased energy 1 2  0 0  Change in appetite 2 3  3  0  Feeling bad or failure about yourself  2 0  0 0  Trouble concentrating 2 3  0 0  Moving slowly or fidgety/restless 0 0  0 0  Suicidal thoughts 0 0  0 0  PHQ-9 Score 14 15  6  0  Difficult doing work/chores     Not difficult at all        11/07/2023    1:32 PM 11/11/2020    8:47 AM 09/15/2020    8:50 AM 12/13/2018   12:59 PM  GAD 7 : Generalized Anxiety Score  Nervous, Anxious, on Edge 2 3 0 0  Control/stop worrying 2 3 0 3  Worry too much - different things 3 3 0 3  Trouble relaxing 1 3 0 2  Restless 0 3 0 0  Easily annoyed or irritable 3 3 0 3  Afraid - awful might happen 0 1 0 1  Total GAD 7 Score 11 19 0 12  Anxiety  Difficulty   Not difficult at all Very difficult     Review of Systems:   Pertinent items are noted in HPI Denies any headaches, blurred vision, fatigue, shortness of breath, chest pain, abdominal pain, abnormal vaginal discharge/itching/odor/irritation, problems with periods, bowel movements, urination, or intercourse unless otherwise stated above. Pertinent History Reviewed:  Reviewed past medical,surgical, social and family history.  Reviewed problem list, medications and allergies. Physical Assessment:   Vitals:   11/07/23 1337  BP: 117/80  Pulse: (!) 101  Weight: 214 lb (97.1 kg)  Height: 5\' 6"  (1.676 m)  Body mass index is 34.54 kg/m.        Physical Examination:   General appearance - well appearing, and in no distress  Mental status - alert, oriented to person, place, and time  Psych:  She has a normal mood and affect  Skin - warm and dry, normal color, no suspicious lesions noted  Chest - effort normal, all lung fields clear to auscultation bilaterally  Heart - normal rate and regular  rhythm  Neck:  midline trachea, no thyromegaly or nodules  Breasts - breasts appear normal, no suspicious masses, no skin or nipple changes or  axillary nodes  Abdomen - soft, nontender, nondistended, no masses or organomegaly  Pelvic - VULVA: normal appearing vulva with no masses, tenderness or lesions  VAGINA: normal appearing vagina with normal color and discharge, no lesions, normal menstrual bleeding CERVIX: normal appearing cervix without discharge or lesions, no CMT, IUD strings visible  Thin prep pap is done w/ HR HPV cotesting  UTERUS: uterus is felt to be normal size, shape, consistency and nontender   ADNEXA: No adnexal masses or tenderness noted.  Extremities:  No swelling or varicosities noted  Chaperone: Latisha Cresenzo  No results found for this or any previous visit (from the past 24 hours).  Assessment & Plan:  1) Well-Woman Exam  2) Irregular and prolonged bleeding  w/ Liletta IUD> s/p endometrial ablation 08/31/21, had Liletta removed/reinserted. CV swab today, if neg take megace, if doesn't help wants IUD removed and start depo  3) STD screen  Labs/procedures today: pap, CV swab, labs  Mammogram: @ 35yo, or sooner if problems Colonoscopy: @ 35yo, or sooner if problems  Orders Placed This Encounter  Procedures   HIV Antibody (routine testing w rflx)   RPR   Hepatitis B surface antigen   HSV 1 and 2 Ab, IgG    Meds:  Meds ordered this encounter  Medications   megestrol (MEGACE) 40 MG tablet    Sig: TAKE 3 TABLETS BY MOUTH DAILY X 5 DAYS, THEN 2 TABLETS DAILY X 5 DAYS, THEN 1 DAILY TILL BLEEDING STOPS    Dispense:  45 tablet    Refill:  1    Follow-up: Return in about 1 year (around 11/06/2024) for Physical.  Cheral Marker CNM, WHNP-BC 11/07/2023 2:11 PM

## 2023-11-07 NOTE — Addendum Note (Signed)
 Addended by: Moss Mc on: 11/07/2023 02:59 PM   Modules accepted: Orders

## 2023-11-08 ENCOUNTER — Encounter: Payer: Self-pay | Admitting: Women's Health

## 2023-11-08 LAB — HSV 1 AND 2 AB, IGG
HSV 1 Glycoprotein G Ab, IgG: NONREACTIVE
HSV 2 IgG, Type Spec: NONREACTIVE

## 2023-11-08 LAB — HIV ANTIBODY (ROUTINE TESTING W REFLEX): HIV Screen 4th Generation wRfx: NONREACTIVE

## 2023-11-08 LAB — RPR: RPR Ser Ql: NONREACTIVE

## 2023-11-08 LAB — HEPATITIS B SURFACE ANTIGEN: Hepatitis B Surface Ag: NEGATIVE

## 2023-11-13 ENCOUNTER — Encounter: Payer: Self-pay | Admitting: Women's Health

## 2023-11-13 ENCOUNTER — Other Ambulatory Visit: Payer: Self-pay | Admitting: Women's Health

## 2023-11-13 LAB — CERVICOVAGINAL ANCILLARY ONLY
Bacterial Vaginitis (gardnerella): POSITIVE — AB
Candida Glabrata: NEGATIVE
Candida Vaginitis: NEGATIVE
Chlamydia: NEGATIVE
Comment: NEGATIVE
Comment: NEGATIVE
Comment: NEGATIVE
Comment: NEGATIVE
Comment: NEGATIVE
Comment: NORMAL
Neisseria Gonorrhea: NEGATIVE
Trichomonas: NEGATIVE

## 2023-11-13 LAB — CYTOLOGY - PAP
Comment: NEGATIVE
Diagnosis: NEGATIVE
High risk HPV: NEGATIVE

## 2023-11-13 MED ORDER — METRONIDAZOLE 500 MG PO TABS: 500.0000 mg | ORAL_TABLET | Freq: Two times a day (BID) | ORAL | 0 refills | Status: DC

## 2023-11-13 MED ORDER — FLUCONAZOLE 150 MG PO TABS: 150.0000 mg | ORAL_TABLET | Freq: Once | ORAL | 0 refills | Status: AC

## 2023-11-13 NOTE — Addendum Note (Signed)
 Addended by: Ferd Householder on: 11/13/2023 03:40 PM   Modules accepted: Orders

## 2023-11-14 ENCOUNTER — Encounter: Payer: Self-pay | Admitting: Women's Health

## 2023-11-29 ENCOUNTER — Other Ambulatory Visit: Payer: Self-pay | Admitting: Women's Health

## 2024-01-27 ENCOUNTER — Other Ambulatory Visit: Payer: Self-pay | Admitting: Women's Health

## 2024-03-10 ENCOUNTER — Telehealth: Admitting: Physician Assistant

## 2024-03-10 DIAGNOSIS — R3989 Other symptoms and signs involving the genitourinary system: Secondary | ICD-10-CM | POA: Diagnosis not present

## 2024-03-10 MED ORDER — NITROFURANTOIN MONOHYD MACRO 100 MG PO CAPS: 100.0000 mg | ORAL_CAPSULE | Freq: Two times a day (BID) | ORAL | 0 refills | Status: DC

## 2024-03-10 NOTE — Progress Notes (Signed)

## 2024-03-19 ENCOUNTER — Ambulatory Visit

## 2024-03-27 ENCOUNTER — Encounter: Payer: Self-pay | Admitting: Women's Health

## 2024-03-27 ENCOUNTER — Ambulatory Visit (INDEPENDENT_AMBULATORY_CARE_PROVIDER_SITE_OTHER): Admitting: Women's Health

## 2024-03-27 ENCOUNTER — Other Ambulatory Visit (HOSPITAL_COMMUNITY)
Admission: RE | Admit: 2024-03-27 | Discharge: 2024-03-27 | Disposition: A | Discharge status: Home / Self Care | Source: Ambulatory Visit | Attending: Women's Health | Admitting: Women's Health

## 2024-03-27 VITALS — BP 118/81 | HR 93 | Ht 66.0 in | Wt 210.0 lb

## 2024-03-27 DIAGNOSIS — N9089 Other specified noninflammatory disorders of vulva and perineum: Secondary | ICD-10-CM | POA: Diagnosis not present

## 2024-03-27 DIAGNOSIS — Z30432 Encounter for removal of intrauterine contraceptive device: Secondary | ICD-10-CM | POA: Diagnosis not present

## 2024-03-27 DIAGNOSIS — N926 Irregular menstruation, unspecified: Secondary | ICD-10-CM | POA: Diagnosis not present

## 2024-03-27 NOTE — Progress Notes (Signed)
   IUD REMOVAL  Patient name: Emily Morales MRN [id number]  Date of birth: 1991-03-17 Subjective Findings:   Emily Morales is a 33 y.o. G45P2002 African American female being seen today for removal of a Liletta  IUD. Her IUD was placed 08/31/21.  She desires removal because she is having frequent periods and now pain w/ sex. Has done megace  and it makes her gain weight. Tried new soap yesterday, has vulvar irritation. Has had ablation and BTS, so doesn't need any contraception. Will just see what periods do.  Signed copy of informed consent in chart.  Patient's last menstrual period was 03/25/2024. Last pap4/9/25. Results were: NILM w/ HRHPV negative The planned method of family planning is tubal ligation and abstinence     11/07/2023    1:31 PM 11/11/2020    8:46 AM 09/15/2020    8:51 AM 06/01/2020    5:29 PM 01/05/2020    5:00 PM  Depression screen PHQ 2/9  Decreased Interest 2 3 0 0 0  Down, Depressed, Hopeless 2 1 0 0 0  PHQ - 2 Score 4 4 0 0 0  Altered sleeping 3 3  3  0  Tired, decreased energy 1 2  0 0  Change in appetite 2 3  3  0  Feeling bad or failure about yourself  2 0  0 0  Trouble concentrating 2 3  0 0  Moving slowly or fidgety/restless 0 0  0 0  Suicidal thoughts 0 0  0 0  PHQ-9 Score 14 15  6  0  Difficult doing work/chores     Not difficult at all        11/07/2023    1:32 PM 11/11/2020    8:47 AM 09/15/2020    8:50 AM 12/13/2018   12:59 PM  GAD 7 : Generalized Anxiety Score  Nervous, Anxious, on Edge 2 3 0 0  Control/stop worrying 2 3 0 3  Worry too much - different things 3 3 0 3  Trouble relaxing 1 3 0 2  Restless 0 3 0 0  Easily annoyed or irritable 3 3 0 3  Afraid - awful might happen 0 1 0 1  Total GAD 7 Score 11 19 0 12  Anxiety Difficulty   Not difficult at all Very difficult     Pertinent History Reviewed:   Reviewed past medical,surgical, social, obstetrical and family history.  Reviewed problem list, medications and allergies. Objective  Findings & Procedure:    Vitals:   03/27/24 1545  BP: 118/81  Pulse: 93  Weight: 210 lb (95.3 kg)  Height: 5' 6 (1.676 m)  Body mass index is 33.89 kg/m.  No results found for this or any previous visit (from the past 24 hours).   Time out was performed.  A graves speculum was placed in the vagina.  The cervix was visualized, and the strings were visible. They were grasped and the Liletta   IUD was easily removed intact without complications. The patient tolerated the procedure well.   Chaperone: Archivist & Plan:   1) Liletta   IUD removal Follow-up prn problems  2) Vulvar irritation> CV swab  No orders of the defined types were placed in this encounter.   Follow-up: Return in about 1 year (around 03/27/2025) for Physical.  Suzen JONELLE Fetters CNM, WHNP-BC 03/27/2024 4:11 PM

## 2024-04-01 LAB — CERVICOVAGINAL ANCILLARY ONLY
Bacterial Vaginitis (gardnerella): NEGATIVE
Candida Glabrata: POSITIVE — AB
Candida Vaginitis: POSITIVE — AB
Chlamydia: NEGATIVE
Comment: NEGATIVE
Comment: NEGATIVE
Comment: NEGATIVE
Comment: NEGATIVE
Comment: NEGATIVE
Comment: NORMAL
Neisseria Gonorrhea: NEGATIVE
Trichomonas: NEGATIVE

## 2024-04-02 ENCOUNTER — Ambulatory Visit: Payer: Self-pay | Admitting: Women's Health

## 2024-04-02 MED ORDER — BORIC ACID VAGINAL 600 MG VA SUPP
600.0000 mg | Freq: Every day | VAGINAL | 0 refills | Status: DC
Start: 2024-04-02 — End: 2024-06-17

## 2024-04-03 ENCOUNTER — Other Ambulatory Visit: Payer: Self-pay | Admitting: Women's Health

## 2024-04-03 DIAGNOSIS — N921 Excessive and frequent menstruation with irregular cycle: Secondary | ICD-10-CM

## 2024-04-11 ENCOUNTER — Ambulatory Visit (HOSPITAL_COMMUNITY)
Admission: RE | Admit: 2024-04-11 | Discharge: 2024-04-11 | Disposition: A | Discharge status: Home / Self Care | Source: Ambulatory Visit | Attending: Women's Health | Admitting: Women's Health

## 2024-04-11 DIAGNOSIS — N921 Excessive and frequent menstruation with irregular cycle: Secondary | ICD-10-CM | POA: Insufficient documentation

## 2024-04-21 ENCOUNTER — Encounter: Payer: Self-pay | Admitting: Women's Health

## 2024-04-23 ENCOUNTER — Encounter: Payer: Self-pay | Admitting: Obstetrics & Gynecology

## 2024-04-23 ENCOUNTER — Ambulatory Visit: Admitting: Obstetrics & Gynecology

## 2024-04-23 VITALS — BP 124/84 | HR 99 | Ht 66.0 in | Wt 213.0 lb

## 2024-04-23 DIAGNOSIS — N939 Abnormal uterine and vaginal bleeding, unspecified: Secondary | ICD-10-CM | POA: Diagnosis not present

## 2024-04-23 DIAGNOSIS — N814 Uterovaginal prolapse, unspecified: Secondary | ICD-10-CM | POA: Diagnosis not present

## 2024-04-23 DIAGNOSIS — D250 Submucous leiomyoma of uterus: Secondary | ICD-10-CM

## 2024-04-23 DIAGNOSIS — Z01818 Encounter for other preprocedural examination: Secondary | ICD-10-CM

## 2024-04-23 DIAGNOSIS — Z9889 Other specified postprocedural states: Secondary | ICD-10-CM

## 2024-04-23 MED ORDER — NORETHINDRONE ACETATE 5 MG PO TABS: 5.0000 mg | ORAL_TABLET | Freq: Every day | ORAL | 1 refills | Status: DC

## 2024-04-23 NOTE — Addendum Note (Signed)
 Addended by: MARILYNN DELON HERO on: 04/23/2024 04:28 PM   Modules accepted: Orders

## 2024-04-23 NOTE — Progress Notes (Addendum)
 GYN VISIT Patient name: Emily Morales MRN [id number]  Date of birth: [birth year] Chief Complaint:   Follow-up (Discuss surgery)  History of Present Illness:   Emily Morales is a 33 y.o. G35P2002 female being seen today to address the following concerns:  - She reports longstanding history of heavy menstrual bleeding as well as AUB.  She has tried multiple medications including surgical intervention..  Previously had been on Megace  but noted weight gain.  She had tried Liletta  however it did not improve her menses and also noted dyspareunia.  Of note, patient has already completed childbearing status post sterilization via salpingectomy and prior endometrial ablation completed with Dr. Jayne in 2023 along with IUD replacement and continued to have irregular bleeding.  Bleeding is very irregular- sometimes nothing, sometimes bleeding for up to a month.  At times, menses can be very heavy- using multiple large pads daily- went through whole box in ~ 3 days.  +dysmenorrhea, +dyspareunia  At this time she wishes to discuss permanent surgical intervention  Notes h/o IBS-C  Recent ultrasound 04-11-2024: 11 x 6 x 7.3 cm uterus.  Volume to 64 cc.  Multiple fibroids largest approximately 2 cm in size.  Normal endometrium.  Normal ovaries bilaterally  Prior operative note reviewed, no mention of endometriosis   Prior abdominal hernia repair 2020 Prior salpingectomy/ablation/IUD in 2023  Patient's last menstrual period was 03/25/2024.    Review of Systems:   Pertinent items are noted in HPI Denies fever/chills, dizziness, headaches, visual disturbances, fatigue, shortness of breath, chest pain, abdominal pain, vomitingabove.  Pertinent History Reviewed:   Past Surgical History:  Procedure Laterality Date   BIOPSY  11/14/2016   Procedure: BIOPSY;  Surgeon: Margo LITTIE Haddock, MD;  Location: [facility];  Service: Endoscopy;;  duodenal gastric   COLPOSCOPY     DILATATION AND  CURETTAGE/HYSTEROSCOPY WITH MINERVA N/A 08/31/2021   Procedure: DILATATION AND CURETTAGE/HYSTEROSCOPY WITH MINERVA Endometrial Ablation;  Surgeon: Jayne Vonn DEL, MD;  Location: [facility];  Service: Gynecology;  Laterality: N/A;   ESOPHAGOGASTRODUODENOSCOPY (EGD) WITH PROPOFOL  N/A 11/14/2016   Dr. haddock: Mild gastritis, normal esophagus, biopsy showed inflammation of the small bowel, gastric biopsies with no H. pylori.   LAPAROSCOPIC BILATERAL SALPINGECTOMY Bilateral 08/31/2021   Procedure: LAPAROSCOPIC BILATERAL SALPINGECTOMY;  Surgeon: Jayne Vonn DEL, MD;  Location: [facility];  Service: Gynecology;  Laterality: Bilateral;   lipoma surgery     MASS EXCISION N/A 02/28/2017   Procedure: EXCISION LIPOMA SCALP 3CM;  Surgeon: Mavis Anes, MD;  Location: [facility];  Service: General;  Laterality: N/A;   UMBILICAL HERNIA REPAIR N/A 04/04/2019   Procedure: HERNIA REPAIR UMBILICAL ADULT WITH MESH;  Surgeon: Kallie Manuelita BROCKS, MD;  Location: [facility];  Service: General;  Laterality: N/A;    Past Medical History:  Diagnosis Date   Abnormal Pap smear    Anemia    Anxiety    Arthritis    Asthma    Chronic back pain    Depression    Dysrhythmia    irregular heartrate   Fibromyalgia    GERD (gastroesophageal reflux disease)    Headache(784.0)    Hx of chlamydia infection    Ovarian cyst, right    PCOS (polycystic ovarian syndrome)    Perimenopausal    Uterine fibroid    Vaginal Pap smear, abnormal    Reviewed problem list, medications and allergies. Physical Assessment:   Vitals:   04/23/24 1546  BP: 124/84  Pulse: 99  Weight: 213 lb (  96.6 kg)  Height: 5' 6 (1.676 m)  Body mass index is 34.38 kg/m.       Physical Examination:   General appearance: alert, well appearing, and in no distress  Psych: mood appropriate, normal affect  Skin: warm & dry   Cardiovascular: normal heart rate noted  Respiratory: normal respiratory effort, no distress  Abdomen: soft, non-tender, no rebound or guarding.   Uterus below umbilicus.  Pelvic: examination not indicated  Extremities: no edema   Chaperone: N/A    Assessment & Plan:  1) AUB -Patient has already failed multiple medical management options.  Briefly discussed alternative including trial of Myfembree or  Orilissa, replacing IUD  -Discussed plan for robotic- assisted laparoscopic hysterectomy and bilateral salpingectomy  -Explained that this surgery is performed to remove the uterus through several small incisions in the abdomen- pending anatomy 3-5 ports. I discussed the risks and benefits of the surgery, including, but not limited to risk of bleeding, including the need for blood transfusion, infection, damage to surrounding organs and tissues such as damage to bladder, ureter or bowel that would requiring additional procedures.  Reviewed long term complications such as fistula or dehiscence requiring further surgical intervention.  Discussed possible need for conversion to an open procedure and potential for other complications that cannot be predicted or prevented including DVT, PE or death. -reviewed same day procedure -typical recovery 8-12 wks with several weeks off work and 8wks of pelvic rest -questions/concerns were addressed and pt desires to proceed  -will trial POPs if needed from now until surgery  -medication list reviewed- no GLP-1 -preop labs to be ordered  -surgical referral sent -For now we will plan for POPS until surgery can be scheduled   Meds ordered this encounter  Medications   norethindrone  (AYGESTIN ) 5 MG tablet    Sig: Take 1 tablet (5 mg total) by mouth daily.    Dispense:  30 tablet    Refill:  1      Return for TBD.   Tanganika Barradas, DO Attending Obstetrician & Gynecologist, [facility] [employer], [facility]

## 2024-04-29 ENCOUNTER — Encounter: Payer: Self-pay | Admitting: Obstetrics & Gynecology

## 2024-05-08 ENCOUNTER — Encounter: Payer: Self-pay | Admitting: Obstetrics & Gynecology

## 2024-05-08 ENCOUNTER — Telehealth: Admitting: Physician Assistant

## 2024-05-08 DIAGNOSIS — R3989 Other symptoms and signs involving the genitourinary system: Secondary | ICD-10-CM

## 2024-05-08 MED ORDER — FLUCONAZOLE 150 MG PO TABS: ORAL_TABLET | ORAL | 0 refills | Status: DC

## 2024-05-08 MED ORDER — CEPHALEXIN 500 MG PO CAPS: 500.0000 mg | ORAL_CAPSULE | Freq: Two times a day (BID) | ORAL | 0 refills | Status: AC

## 2024-05-08 NOTE — Progress Notes (Signed)

## 2024-05-20 ENCOUNTER — Other Ambulatory Visit: Payer: Self-pay | Admitting: *Deleted

## 2024-05-24 ENCOUNTER — Emergency Department (HOSPITAL_COMMUNITY)
Admission: EM | Admit: 2024-05-24 | Discharge: 2024-05-24 | Disposition: A | Discharge status: Home / Self Care | Attending: Emergency Medicine | Admitting: Emergency Medicine

## 2024-05-24 ENCOUNTER — Encounter (HOSPITAL_COMMUNITY): Payer: Self-pay

## 2024-05-24 ENCOUNTER — Other Ambulatory Visit: Payer: Self-pay

## 2024-05-24 ENCOUNTER — Emergency Department (HOSPITAL_COMMUNITY)

## 2024-05-24 DIAGNOSIS — Z7951 Long term (current) use of inhaled steroids: Secondary | ICD-10-CM | POA: Diagnosis not present

## 2024-05-24 DIAGNOSIS — J45909 Unspecified asthma, uncomplicated: Secondary | ICD-10-CM | POA: Diagnosis not present

## 2024-05-24 DIAGNOSIS — M25532 Pain in left wrist: Secondary | ICD-10-CM | POA: Diagnosis present

## 2024-05-24 DIAGNOSIS — Z91040 Latex allergy status: Secondary | ICD-10-CM | POA: Diagnosis not present

## 2024-05-24 DIAGNOSIS — M7989 Other specified soft tissue disorders: Secondary | ICD-10-CM | POA: Insufficient documentation

## 2024-05-24 MED ORDER — OXYCODONE-ACETAMINOPHEN 5-325 MG PO TABS: 1.0000 | ORAL_TABLET | Freq: Three times a day (TID) | ORAL | 0 refills | Status: AC | PRN

## 2024-05-24 NOTE — ED Triage Notes (Signed)
 Pt c/o left wrist pain for over a week. Pt states feeling a pop and pain while lifting. Pt has attempted muscle rubs and ibuprofen .

## 2024-05-24 NOTE — Discharge Instructions (Signed)
 Call Dr. Areatha office to set up an appointment.  Wear wrist brace for comfort.  Continue ibuprofen  and Tylenol  as needed for pain.

## 2024-05-24 NOTE — ED Notes (Signed)
Brace applied to left wrist. ?

## 2024-05-24 NOTE — ED Provider Notes (Signed)
 [place] EMERGENCY DEPARTMENT AT [facility] Provider Note   CSN: [id number] Arrival date & time: 05/24/24  [id number]     Patient presents with: Wrist Pain   Emily Morales is a 33 y.o. female.   HPI Patient presents for left wrist pain.  Medical history includes asthma, GERD, fibromyalgia, anxiety, depression, arthritis, anemia.  Patient reports a lifting injury to her left wrist 1.5 weeks ago.  She has been taking ibuprofen , Tylenol , and muscle rubs as needed since that time.  Yesterday, while at work, she noticed worsening pain and swelling to the dorsal aspect of her left wrist.  While sleeping this morning, she felt a pop and had worsened pain.  Patient reports intermittent shooting pains that radiate up left forearm.  Her wrist pain is worsened with wrist and hand range of motion.    Prior to Admission medications   Medication Sig Start Date End Date Taking? Authorizing Provider  Boric Acid Vaginal 600 MG SUPP Place 600 mg vaginally at bedtime. X 2 weeks. DO NOT TAKE BY MOUTH. 04/02/24   Kizzie Suzen SAUNDERS, CNM  cyclobenzaprine  (FLEXERIL ) 10 MG tablet Take 10 mg by mouth 2 (two) times daily as needed for muscle spasms.    [provider]  DULoxetine (CYMBALTA) 60 MG capsule Take 60 mg by mouth daily. 05/30/23   [provider]  EPINEPHrine  0.3 mg/0.3 mL IJ SOAJ injection Inject 0.3 mg into the muscle as needed for anaphylaxis. Patient not taking: Reported on 11/07/2023 11/09/19   [provider]  fluconazole  (DIFLUCAN ) 150 MG tablet Take 1 tablet PO once. Repeat in 3 days if needed. 05/08/24   Gladis Elsie BROCKS, PA-C  hydrOXYzine  (ATARAX ) 25 MG tablet Take 25 mg by mouth 3 (three) times daily as needed. 07/30/23   [provider]  methylphenidate 27 MG PO CR tablet Take 27 mg by mouth every morning.    [provider]  norethindrone  (AYGESTIN ) 5 MG tablet Take 1 tablet (5 mg total) by mouth daily. 04/23/24 05/23/24  Ozan,  Jennifer, DO  temazepam  (RESTORIL ) 15 MG capsule Take 1 capsule (15 mg total) by mouth at bedtime as needed for sleep. 08/19/19 10/31/19  Vincente Murrain, MD    Allergies: Other, Cat dander, Cattle epithelium, Dog fennel, Hctz [hydrochlorothiazide ], Meat [alpha-gal], Mixed feathers, Mixed grasses, Pollen extract, Tree extract, and Latex    Review of Systems  Musculoskeletal:  Positive for arthralgias and joint swelling.  All other systems reviewed and are negative.   Updated Vital Signs BP 126/78   Pulse 90   Temp 98 F (36.7 C)   Resp 16   Ht 5' 6 (1.676 m)   Wt 96.6 kg   SpO2 100%   BMI 34.37 kg/m   Physical Exam Vitals and nursing note reviewed.  Constitutional:      General: She is not in acute distress.    Appearance: Normal appearance. She is well-developed. She is not ill-appearing, toxic-appearing or diaphoretic.  HENT:     Head: Normocephalic and atraumatic.     Right Ear: External ear normal.     Left Ear: External ear normal.     Nose: Nose normal.     Mouth/Throat:     Mouth: Mucous membranes are moist.  Eyes:     Extraocular Movements: Extraocular movements intact.     Conjunctiva/sclera: Conjunctivae normal.  Cardiovascular:     Rate and Rhythm: Normal rate and regular rhythm.  Pulmonary:     Effort: Pulmonary effort is  normal. No respiratory distress.  Abdominal:     General: There is no distension.     Palpations: Abdomen is soft.     Tenderness: There is no abdominal tenderness.  Musculoskeletal:        General: Swelling and tenderness present.     Cervical back: Normal range of motion and neck supple.  Skin:    General: Skin is warm and dry.     Coloration: Skin is not jaundiced or pale.  Neurological:     General: No focal deficit present.     Mental Status: She is alert and oriented to person, place, and time.     Cranial Nerves: No cranial nerve deficit.     Sensory: No sensory deficit.     Motor: No weakness.     Coordination: Coordination  normal.  Psychiatric:        Mood and Affect: Mood normal.        Behavior: Behavior normal.     (all labs ordered are listed, but only abnormal results are displayed) Labs Reviewed - No data to display  EKG: None  Radiology: DG Wrist Complete Left Result Date: 05/24/2024 EXAM: 3 or more VIEW(S) XRAY OF THE LEFT WRIST 05/24/2024 07:34:00 AM COMPARISON: None available. CLINICAL HISTORY: wrist pain. Per chart: Pt c/o left wrist pain for over a week. Pt states feeling a pop and pain while lifting. Pt has attempted muscle rubs and ibuprofen .; states pain with movement of middle finger FINDINGS: BONES AND JOINTS: No acute fracture. No focal osseous lesion. No joint dislocation. SOFT TISSUES: The soft tissues are unremarkable. IMPRESSION: 1. No significant abnormality. Electronically signed by: Waddell Calk MD 05/24/2024 07:37 AM EDT RP Workstation: [id number]     Procedures   Medications Ordered in the ED - No data to display                                  Medical Decision Making Amount and/or Complexity of Data Reviewed Radiology: ordered.   Patient presenting for left wrist pain and swelling.  She had onset of what she describes as overuse/strain injury 1.5 weeks ago.  She has had ongoing discomfort since that time.  This discomfort has worsened over the past couple days.  Yesterday, she noticed swelling.  This morning, she felt a pop in had worsened pain.  On arrival in the ED, patient is overall well-appearing.  She does have some mild swelling to the dorsal aspect of her left distal forearm.  This area is tender.  It is not erythematous or warm.  Distal hand is neurovascularly intact.  She has limited range of motion of wrist and fingers due to the pain.  She declines any pain medication at this time.  X-ray imaging did not show any acute findings.  Patient advised to follow-up with orthopedic surgery.  Wrist-based provided.  Patient discharged in stable condition.     Final  diagnoses:  Left wrist pain    ED Discharge Orders     None          Melvenia Motto, MD 05/24/24 [phone]

## 2024-06-11 NOTE — Patient Instructions (Signed)
 Emily Morales [facility]  06/11/2024     @PREFPERIOPPHARMACY @   Your procedure is scheduled on 06/07/2024.   Report to [facility] at  0600 A.M.   Call this number if you have problems the morning of surgery:  [phone]  If you experience any cold or flu symptoms such as cough, fever, chills, shortness of breath, etc. between now and your scheduled surgery, please notify us  at the above number.   Remember:  Do not eat after midnight.   You may drink clear liquids until 0330 am on 06/07/2024.          Clear liquids allowed are:                    Water , Carbonated beverages (diabetics please choose diet or no sugar options), Black Coffee Only (No creamer, milk or cream, including half & half and powdered creamer), and Clear Sports drink (No red color; diabetics please choose diet or no sugar options)    Take these medicines the morning of surgery with A SIP OF WATER                              cyclobenzaprine , hydroxyzine , duloxetine.    Do not wear jewelry, make-up or nail polish, including gel polish,  artificial nails, or any other type of covering on natural nails (fingers and  toes).  Do not wear lotions, powders, or perfumes, or deodorant.  Do not shave 48 hours prior to surgery.  Men may shave face and neck.  Do not bring valuables to the hospital.  [facility] is not responsible for any belongings or valuables.  Contacts, dentures or bridgework may not be worn into surgery.  Leave your suitcase in the car.  After surgery it may be brought to your room.  For patients admitted to the hospital, discharge time will be determined by your treatment team.  Patients discharged the day of surgery will not be allowed to drive home and must have someone with them for 24 hours.    Special instructions:             DO NOT smoke tobacco or vape for 24 hours before your procedure.  Please read over the following fact sheets that you were given. Pain Booklet,  Coughing and Deep Breathing, Blood Transfusion Information, Surgical Site Infection Prevention, Anesthesia Post-op Instructions, and Care and Recovery After Surgery      Laparoscopically Assisted Vaginal Hysterectomy, Care After After a LAVH, it's common to have soreness and numbness in your incision areas. You'll have pain in your belly. You may also have: Bleeding and discharge from the vagina. Tiredness. Sadness and other emotions. If your ovaries were taken out, you may also have symptoms of menopause, such as hot flashes, night sweats, and lack of sleep. Follow these instructions at home: Medicines Take over-the-counter and prescription medicines only as told by your health care provider. If you were given antibiotics, take them as told by your provider. Do not stop taking the antibiotic even if you start to feel better. Ask your provider if the medicine prescribed to you: Requires you to avoid driving or using machinery. Can cause constipation. You may need to take these actions to prevent or treat constipation: Drink enough fluid to keep your pee (urine) pale yellow. Take over-the-counter or prescription medicines. Eat foods that are high in fiber, such as beans, whole grains,  and fresh fruits and vegetables. Limit foods that are high in fat and processed sugars, such as fried or sweet foods. Incision care  Follow instructions from your provider about how to take care of your incisions. Make sure you: Wash your hands with soap and water  for at least 20 seconds before and after you change your bandage. If soap and water  aren't available, use hand sanitizer. Change your dressing as told by your provider. Leave stitches, skin glue, or tape strips in place. These skin closures may need to stay in place for 2 weeks or longer. If tape strip edges start to loosen and curl up, you may trim the loose edges. Do not remove tape strips completely unless your provider tells you to do that. Check  your incision areas every day for signs of infection. Check for: More redness, swelling, or pain. More fluid or blood. Warmth. Pus or a bad smell. Activity  Rest as told by your provider. Do not sit for a long time without moving. Get up to take short walks every 1-2 hours. This will improve blood flow and breathing. Ask for help if you feel weak or unsteady. You may have to avoid lifting. Ask your provider how much you can safely lift. Return to your normal activities as told by your provider. Ask your provider what activities are safe for you. Lifestyle Do not use any products that contain nicotine or tobacco. These products include cigarettes, chewing tobacco, and vaping devices, such as e-cigarettes. These can delay healing after surgery. If you need help quitting, ask your provider. Do not drink alcohol until your provider approves. General instructions Do not douche, use tampons, have sex, or put anything in the vagina for at least 6 weeks. If you struggle with physical or emotional changes after your procedure, speak with your provider or a therapist. Do not take baths, swim, or use a hot tub until your provider approves. Ask your provider if you may take showers. You may only be allowed to take sponge baths. Try to have someone at home with you for the first 1-2 weeks to help with your daily chores. Wear compression stockings as told by your provider. These stockings help to prevent blood clots and reduce swelling in your legs. Your provider may give you more instructions. Make sure you know what you can and can't do. Contact a health care provider if: You have any signs of infection. You have pain and your pain medicine doesn't help. You feel dizzy or light-headed. You have trouble peeing. You vomit or feel like you may vomit, and the symptoms do not go away. You have pus or discharge from your vagina that smells bad. Get help right away if: You have a fever and your symptoms  suddenly get worse. You have very bad pain in the abdomen. You have chest pain or shortness of breath. You faint. You have pain, swelling, or redness in your leg. You have heavy bleeding in your vagina that soaks through a pad in less than 1 hour. You see blood clots in your bleeding. These symptoms may be an emergency. Get help right away. Call 911. Do not wait to see if the symptoms will go away. Do not drive yourself to the hospital. This information is not intended to replace advice given to you by your health care provider. Make sure you discuss any questions you have with your health care provider. Document Revised: 10/27/2022 Document Reviewed: 10/27/2022 Elsevier Patient Education  2024 Elsevier Inc.General Anesthesia,  Adult, Care After The following information offers guidance on how to care for yourself after your procedure. Your health care provider may also give you more specific instructions. If you have problems or questions, contact your health care provider. What can I expect after the procedure? After the procedure, it is common for people to: Have pain or discomfort at the IV site. Have nausea or vomiting. Have a sore throat or hoarseness. Have trouble concentrating. Feel cold or chills. Feel weak, sleepy, or tired (fatigue). Have soreness and body aches. These can affect parts of the body that were not involved in surgery. Follow these instructions at home: For the time period you were told by your health care provider:  Rest. Do not participate in activities where you could fall or become injured. Do not drive or use machinery. Do not drink alcohol. Do not take sleeping pills or medicines that cause drowsiness. Do not make important decisions or sign legal documents. Do not take care of children on your own. General instructions Drink enough fluid to keep your urine pale yellow. If you have sleep apnea, surgery and certain medicines can increase your risk for  breathing problems. Follow instructions from your health care provider about wearing your sleep device: Anytime you are sleeping, including during daytime naps. While taking prescription pain medicines, sleeping medicines, or medicines that make you drowsy. Return to your normal activities as told by your health care provider. Ask your health care provider what activities are safe for you. Take over-the-counter and prescription medicines only as told by your health care provider. Do not use any products that contain nicotine or tobacco. These products include cigarettes, chewing tobacco, and vaping devices, such as e-cigarettes. These can delay incision healing after surgery. If you need help quitting, ask your health care provider. Contact a health care provider if: You have nausea or vomiting that does not get better with medicine. You vomit every time you eat or drink. You have pain that does not get better with medicine. You cannot urinate or have bloody urine. You develop a skin rash. You have a fever. Get help right away if: You have trouble breathing. You have chest pain. You vomit blood. These symptoms may be an emergency. Get help right away. Call 911. Do not wait to see if the symptoms will go away. Do not drive yourself to the hospital. Summary After the procedure, it is common to have a sore throat, hoarseness, nausea, vomiting, or to feel weak, sleepy, or fatigue. For the time period you were told by your health care provider, do not drive or use machinery. Get help right away if you have difficulty breathing, have chest pain, or vomit blood. These symptoms may be an emergency. This information is not intended to replace advice given to you by your health care provider. Make sure you discuss any questions you have with your health care provider. Document Revised: 10/14/2021 Document Reviewed: 10/14/2021 Elsevier Patient Education  2024 Elsevier Inc.How to Use Chlorhexidine  at  Home in the Shower Chlorhexidine  gluconate (CHG) is a germ-killing (antiseptic) wash that's used to clean the skin. It can get rid of the germs that normally live on the skin and can keep them away for about 24 hours. If you're having surgery, you may be told to shower with CHG at home the night before surgery. This can help lower your risk for infection. To use CHG wash in the shower, follow the steps below. Supplies needed: CHG body wash. Clean washcloth. Clean  towel. How to use CHG in the shower Follow these steps unless you're told to use CHG in a different way: Start the shower. Use your normal soap and shampoo to wash your face and hair. Turn off the shower or move out of the shower stream. Pour CHG onto a clean washcloth. Do not use any type of brush or rough sponge. Start at your neck, washing your body down to your toes. Make sure you: Wash the part of your body where the surgery will be done for at least 1 minute. Do not scrub. Do not use CHG on your head or face unless your health care provider tells you to. If it gets into your ears or eyes, rinse them well with water . Do not wash your genitals with CHG. Wash your back and under your arms. Make sure to wash skin folds. Let the CHG sit on your skin for 1-2 minutes or as long as told. Rinse your entire body in the shower, including all body creases and folds. Turn off the shower. Dry off with a clean towel. Do not put anything on your skin afterward, such as powder, lotion, or perfume. Put on clean clothes or pajamas. If it's the night before surgery, sleep in clean sheets. General tips Use CHG only as told, and follow the instructions on the label. Use the full amount of CHG as told. This is often one bottle. Do not smoke and stay away from flames after using CHG. Your skin may feel sticky after using CHG. This is normal. The sticky feeling will go away as the CHG dries. Do not use CHG: If you have a chlorhexidine  allergy  or have reacted to chlorhexidine  in the past. On open wounds or areas of skin that have broken skin, cuts, or scrapes. On babies younger than 53 months of age. Contact a health care provider if: You have questions about using CHG. Your skin gets irritated or itchy. You have a rash after using CHG. You swallow any CHG. Call your local poison control center [phone] in the [place] Your eyes itch badly, or they become very red or swollen. Your hearing changes. You have trouble seeing. If you can't reach your provider, go to an urgent care or emergency room. Do not drive yourself. Get help right away if: You have swelling or tingling in your mouth or throat. You make high-pitched whistling sounds when you breathe, most often when you breathe out (wheeze). You have trouble breathing. These symptoms may be an emergency. Call 911 right away. Do not wait to see if the symptoms will go away. Do not drive yourself to the hospital. This information is not intended to replace advice given to you by your health care provider. Make sure you discuss any questions you have with your health care provider. Document Revised: 01/30/2023 Document Reviewed: 01/26/2022 Elsevier Patient Education  2024 [employer].

## 2024-06-12 ENCOUNTER — Encounter (HOSPITAL_COMMUNITY): Payer: Self-pay

## 2024-06-12 ENCOUNTER — Other Ambulatory Visit: Payer: Self-pay

## 2024-06-12 ENCOUNTER — Encounter (HOSPITAL_COMMUNITY)
Admission: RE | Admit: 2024-06-12 | Discharge: 2024-06-12 | Disposition: A | Discharge status: Home / Self Care | Source: Ambulatory Visit | Attending: Obstetrics & Gynecology | Admitting: Obstetrics & Gynecology

## 2024-06-12 VITALS — BP 114/88 | HR 88 | Resp 18 | Ht 66.0 in | Wt 213.0 lb

## 2024-06-12 DIAGNOSIS — D259 Leiomyoma of uterus, unspecified: Secondary | ICD-10-CM | POA: Insufficient documentation

## 2024-06-12 DIAGNOSIS — N946 Dysmenorrhea, unspecified: Secondary | ICD-10-CM | POA: Diagnosis not present

## 2024-06-12 DIAGNOSIS — N939 Abnormal uterine and vaginal bleeding, unspecified: Secondary | ICD-10-CM | POA: Insufficient documentation

## 2024-06-12 DIAGNOSIS — Z8679 Personal history of other diseases of the circulatory system: Secondary | ICD-10-CM | POA: Diagnosis not present

## 2024-06-12 DIAGNOSIS — Z01818 Encounter for other preprocedural examination: Secondary | ICD-10-CM | POA: Insufficient documentation

## 2024-06-12 LAB — COMPREHENSIVE METABOLIC PANEL WITH GFR
ALT: 12 U/L (ref 0–44)
AST: 17 U/L (ref 15–41)
Albumin: 4.3 g/dL (ref 3.5–5.0)
Alkaline Phosphatase: 86 U/L (ref 38–126)
Anion gap: 11 (ref 5–15)
BUN: 7 mg/dL (ref 6–20)
CO2: 24 mmol/L (ref 22–32)
Calcium: 9.1 mg/dL (ref 8.9–10.3)
Chloride: 103 mmol/L (ref 98–111)
Creatinine, Ser: 0.63 mg/dL (ref 0.44–1.00)
GFR, Estimated: >60 mL/min (ref >60)
Glucose, Bld: 82 mg/dL (ref 70–99)
Potassium: 4.3 mmol/L (ref 3.5–5.1)
Sodium: 139 mmol/L (ref 135–145)
Total Bilirubin: 0.6 mg/dL (ref 0.0–1.2)
Total Protein: 7.7 g/dL (ref 6.5–8.1)

## 2024-06-12 LAB — CBC
HCT: 35.7 % — ABNORMAL LOW (ref 36.0–46.0)
Hemoglobin: 11.9 g/dL — ABNORMAL LOW (ref 12.0–15.0)
MCH: 29.6 pg (ref 26.0–34.0)
MCHC: 33.3 g/dL (ref 30.0–36.0)
MCV: 88.8 fL (ref 80.0–100.0)
Platelets: 265 K/uL (ref 150–400)
RBC: 4.02 MIL/uL (ref 3.87–5.11)
RDW: 13.1 % (ref 11.5–15.5)
WBC: 4.6 K/uL (ref 4.0–10.5)
nRBC: 0 % (ref 0.0–0.2)

## 2024-06-12 LAB — PREGNANCY, URINE: Preg Test, Ur: NEGATIVE

## 2024-06-15 NOTE — H&P (Signed)
 [facility] Attending History and Physical  Emily Morales is a 33 y.o. [id number] who presents for scheduled robotic assisted laparoscopic hysterectomy,possible cystoscopy.  In review, she has a longstanding history of heavy menstrual bleeding.  She has tried multiple medications including Megace  and Liletta  without significant relief of her symptoms.  At times bleeding may cause her to soak through several large pads daily.  She also notes dysmenorrhea and dyspareunia.    Patient already trialed an endometrial ablation and salpingectomy in 2023 however the irregular bleeding has continued.  She has completed childbearing and desires permanent surgical intervention.  Denies any abnormal vaginal discharge, fevers, chills, sweats, dysuria, nausea, vomiting, other GI or GU symptoms or other general symptoms.  Past Medical History:  Diagnosis Date   Abnormal Pap smear    Anemia    Anxiety    Arthritis    Asthma    Chronic back pain    Depression    Dysrhythmia    irregular heartrate RBBB   Fibromyalgia    GERD (gastroesophageal reflux disease)    Headache(784.0)    Hx of chlamydia infection    Ovarian cyst, right    PCOS (polycystic ovarian syndrome)    Perimenopausal    Uterine fibroid    Vaginal Pap smear, abnormal    Past Surgical History:  Procedure Laterality Date   BIOPSY  11/14/2016   Procedure: BIOPSY;  Surgeon: Margo LITTIE Haddock, MD;  Location: [facility];  Service: Endoscopy;;  duodenal gastric   COLPOSCOPY     DILATATION AND CURETTAGE/HYSTEROSCOPY WITH MINERVA N/A 08/31/2021   Procedure: DILATATION AND CURETTAGE/HYSTEROSCOPY WITH MINERVA Endometrial Ablation;  Surgeon: Jayne Vonn DEL, MD;  Location: [facility];  Service: Gynecology;  Laterality: N/A;   ESOPHAGOGASTRODUODENOSCOPY (EGD) WITH PROPOFOL  N/A 11/14/2016   Dr. haddock: Mild gastritis, normal esophagus, biopsy showed inflammation of the small bowel, gastric biopsies with no H.  pylori.   LAPAROSCOPIC BILATERAL SALPINGECTOMY Bilateral 08/31/2021   Procedure: LAPAROSCOPIC BILATERAL SALPINGECTOMY;  Surgeon: Jayne Vonn DEL, MD;  Location: [facility];  Service: Gynecology;  Laterality: Bilateral;   lipoma surgery     back of head   MASS EXCISION N/A 02/28/2017   Procedure: EXCISION LIPOMA SCALP 3CM;  Surgeon: Mavis Anes, MD;  Location: [facility];  Service: General;  Laterality: N/A;   UMBILICAL HERNIA REPAIR N/A 04/04/2019   Procedure: HERNIA REPAIR UMBILICAL ADULT WITH MESH;  Surgeon: Kallie Manuelita BROCKS, MD;  Location: [facility];  Service: General;  Laterality: N/A;   OB History  Gravida Para Term Preterm AB Living  2 2 2  0 0 2  SAB IAB Ectopic Multiple Live Births  0 0 0 0 2    # Outcome Date GA Lbr Len/2nd Weight Sex Type Anes PTL Lv  2 Term 10/18/15 [redacted]w[redacted]d 2296:49 3566 g M Vag-Spont EPI  LIV  1 Term 04/25/12 [redacted]w[redacted]d 13:20 / 00:16 3535 g M Vag-Spont EPI  LIV     Birth Comments: WNL  Patient denies any other pertinent gynecologic issues.  No current facility-administered medications on file prior to encounter.   Current Outpatient Medications on File Prior to Encounter  Medication Sig Dispense Refill   cyclobenzaprine  (FLEXERIL ) 10 MG tablet Take 10 mg by mouth 2 (two) times daily as needed for muscle spasms.     DULoxetine (CYMBALTA) 60 MG capsule Take 60 mg by mouth daily.     hydrOXYzine  (ATARAX ) 25 MG tablet Take 25 mg by mouth 3 (three) times daily as needed  for anxiety.     methylphenidate 27 MG PO CR tablet Take 27 mg by mouth daily as needed (when working).     norethindrone  (AYGESTIN ) 5 MG tablet Take 1 tablet (5 mg total) by mouth daily. 30 tablet 1   Boric Acid Vaginal 600 MG SUPP Place 600 mg vaginally at bedtime. X 2 weeks. DO NOT TAKE BY MOUTH. 14 suppository 0   [DISCONTINUED] temazepam  (RESTORIL ) 15 MG capsule Take 1 capsule (15 mg total) by mouth at bedtime as needed for sleep. 30 capsule 1   Allergies  Allergen Reactions   Other Anaphylaxis,  Palpitations and Rash    Water  chestnut   Cat Dander Cough   Cattle Epithelium Cough and Itching   Dog Fennel Cough   Hctz [Hydrochlorothiazide ] Itching   Meat [Alpha-Gal] Hives    Red Meat   Mixed Feathers Cough and Itching   Mixed Grasses Itching    trees   Pollen Extract Cough   Tree Extract Itching   Latex Rash    Social History:   reports that she has never smoked. She has never used smokeless tobacco. She reports current alcohol use. She reports that she does not currently use drugs after having used the following drugs: Other-see comments. Family History  Problem Relation Age of Onset   Liver disease Paternal Grandfather        etoh related   Heart disease Paternal Grandmother    Hypertension Paternal Grandmother    Cancer Maternal Grandmother        throat   Alcohol abuse Maternal Grandfather    Diabetes Father    Hypertension Father    Kidney disease Father    Depression Mother    Anxiety disorder Mother    Other Son        reactive airway disease   Asthma Son    Heart murmur Son    Colon cancer Neg Hx     Review of Systems: Pertinent items noted in HPI and remainder of comprehensive ROS otherwise negative.  PHYSICAL EXAM: Last menstrual period 05/21/2024. *** CONSTITUTIONAL: Well-developed, well-nourished female in no acute distress.  SKIN: Skin is warm and dry. No rash noted. Not diaphoretic. No erythema. No pallor. NEUROLOGIC: Alert and oriented to person, place, and time. Normal reflexes, muscle tone coordination. No cranial nerve deficit noted. PSYCHIATRIC: Normal mood and affect. Normal behavior. Normal judgment and thought content. CARDIOVASCULAR: Normal heart rate noted, regular rhythm RESPIRATORY: Effort and breath sounds normal, no problems with respiration noted ABDOMEN: Soft, nontender, nondistended. PELVIC: deferred MUSCULOSKELETAL: no calf tenderness bilaterally EXT: no edema bilaterally, normal pulses  Labs: Results for orders placed or  performed during the hospital encounter of 06/12/24 (from the past 2 weeks)  CBC   Collection Time: 06/12/24  8:49 AM  Result Value Ref Range   WBC 4.6 4.0 - 10.5 K/uL   RBC 4.02 3.87 - 5.11 MIL/uL   Hemoglobin 11.9 (L) 12.0 - 15.0 g/dL   HCT 64.2 (L) 63.9 - 53.9 %   MCV 88.8 80.0 - 100.0 fL   MCH 29.6 26.0 - 34.0 pg   MCHC 33.3 30.0 - 36.0 g/dL   RDW 86.8 88.4 - 84.4 %   Platelets 265 150 - 400 K/uL   nRBC 0.0 0.0 - 0.2 %  Comprehensive metabolic panel   Collection Time: 06/12/24  8:49 AM  Result Value Ref Range   Sodium 139 135 - 145 mmol/L   Potassium 4.3 3.5 - 5.1 mmol/L   Chloride 103  98 - 111 mmol/L   CO2 24 22 - 32 mmol/L   Glucose, Bld 82 70 - 99 mg/dL   BUN 7 6 - 20 mg/dL   Creatinine, Ser 9.36 0.44 - 1.00 mg/dL   Calcium 9.1 8.9 - 89.6 mg/dL   Total Protein 7.7 6.5 - 8.1 g/dL   Albumin 4.3 3.5 - 5.0 g/dL   AST 17 15 - 41 U/L   ALT 12 0 - 44 U/L   Alkaline Phosphatase 86 38 - 126 U/L   Total Bilirubin 0.6 0.0 - 1.2 mg/dL   GFR, Estimated >39 >39 mL/min   Anion gap 11 5 - 15  Pregnancy, urine   Collection Time: 06/12/24  8:49 AM  Result Value Ref Range   Preg Test, Ur NEGATIVE NEGATIVE  Type and screen   Collection Time: 06/12/24  8:49 AM  Result Value Ref Range   ABO/RH(D) O POS    Antibody Screen NEG    Sample Expiration 06/15/2024,2359    Extend sample reason      NO TRANSFUSIONS OR PREGNANCY IN THE PAST 3 MONTHS Performed at [facility], [address], [place], [place] [address]     Imaging Studies: DG Wrist Complete Left Result Date: 05/24/2024 EXAM: 3 or more VIEW(S) XRAY OF THE LEFT WRIST 05/24/2024 07:34:00 AM COMPARISON: None available. CLINICAL HISTORY: wrist pain. Per chart: Pt c/o left wrist pain for over a week. Pt states feeling a pop and pain while lifting. Pt has attempted muscle rubs and ibuprofen .; states pain with movement of middle finger FINDINGS: BONES AND JOINTS: No acute fracture. No focal osseous lesion. No joint dislocation. SOFT  TISSUES: The soft tissues are unremarkable. IMPRESSION: 1. No significant abnormality. Electronically signed by: Waddell Calk MD 05/24/2024 07:37 AM EDT RP Workstation: [id number]    Assessment: Heavy menstrual bleeding, failed endometrial ablation - Dyspareunia - Dysmenorrhea  Plan: Robotic assisted laparoscopic hysterectomy, possible cystoscopy -NPO -LR @ 125cc/hr -SCDs to OR -Risk/benefits and alternatives reviewed with the patient including but not limited to risk of bleeding, infection and injury to surrounding organs requiring further surgical intervention.  Please see prior office note for full informed consent was completed.  Questions and concerns were addressed and pt desires to proceed  Jadelyn Elks, DO Attending Obstetrician & Gynecologist, [facility] for [facility]

## 2024-06-16 NOTE — Anesthesia Preprocedure Evaluation (Signed)
 Anesthesia Evaluation  Patient identified by MRN, date of birth, ID band Patient awake    Reviewed: Allergy & Precautions, NPO status , Patient's Chart, lab work & pertinent test results  History of Anesthesia Complications (+) Emergence Delirium and history of anesthetic complications  Airway Mallampati: II  TM Distance: >3 FB Neck ROM: Full    Dental no notable dental hx. (+) Dental Advisory Given, Teeth Intact   Pulmonary asthma , neg COPD   Pulmonary exam normal breath sounds clear to auscultation       Cardiovascular Exercise Tolerance: Good Normal cardiovascular exam+ dysrhythmias (irregular heart beat)  Rhythm:Regular Rate:Normal     Neuro/Psych  Headaches PSYCHIATRIC DISORDERS Anxiety Depression    Panic disorder   GI/Hepatic Neg liver ROS,GERD  ,,  Endo/Other  negative endocrine ROS    Renal/GU negative Renal ROS  negative genitourinary   Musculoskeletal  (+) Arthritis ,    Abdominal   Peds negative pediatric ROS (+)  Hematology  (+) Blood dyscrasia, anemia   Anesthesia Other Findings   Reproductive/Obstetrics negative OB ROS                              Anesthesia Physical Anesthesia Plan  ASA: 2  Anesthesia Plan: General   Post-op Pain Management: Dilaudid  IV   Induction: Intravenous  PONV Risk Score and Plan: 4 or greater and Ondansetron , Dexamethasone , Midazolam  and Scopolamine  patch - Pre-op  Airway Management Planned: Oral ETT  Additional Equipment: None  Intra-op Plan:   Post-operative Plan: Extubation in OR  Informed Consent: I have reviewed the patients History and Physical, chart, labs and discussed the procedure including the risks, benefits and alternatives for the proposed anesthesia with the patient or authorized representative who has indicated his/her understanding and acceptance.     Dental advisory given  Plan Discussed with:  CRNA  Anesthesia Plan Comments:          Anesthesia Quick Evaluation

## 2024-06-17 ENCOUNTER — Encounter (HOSPITAL_COMMUNITY): Payer: Self-pay | Admitting: Obstetrics & Gynecology

## 2024-06-17 ENCOUNTER — Ambulatory Visit (HOSPITAL_COMMUNITY)
Admission: RE | Admit: 2024-06-17 | Discharge: 2024-06-17 | Disposition: A | Discharge status: Home / Self Care | Attending: Obstetrics & Gynecology | Admitting: Obstetrics & Gynecology

## 2024-06-17 ENCOUNTER — Encounter (HOSPITAL_COMMUNITY): Payer: Self-pay | Admitting: Anesthesiology

## 2024-06-17 ENCOUNTER — Ambulatory Visit (HOSPITAL_BASED_OUTPATIENT_CLINIC_OR_DEPARTMENT_OTHER): Payer: Self-pay | Admitting: Anesthesiology

## 2024-06-17 ENCOUNTER — Ambulatory Visit (HOSPITAL_COMMUNITY)

## 2024-06-17 ENCOUNTER — Other Ambulatory Visit: Payer: Self-pay

## 2024-06-17 ENCOUNTER — Encounter (HOSPITAL_COMMUNITY)
Admission: RE | Disposition: A | Discharge status: Home / Self Care | Payer: Self-pay | Source: Home / Self Care | Attending: Obstetrics & Gynecology

## 2024-06-17 DIAGNOSIS — N939 Abnormal uterine and vaginal bleeding, unspecified: Secondary | ICD-10-CM | POA: Diagnosis present

## 2024-06-17 DIAGNOSIS — N941 Unspecified dyspareunia: Secondary | ICD-10-CM | POA: Insufficient documentation

## 2024-06-17 DIAGNOSIS — N920 Excessive and frequent menstruation with regular cycle: Secondary | ICD-10-CM

## 2024-06-17 DIAGNOSIS — D259 Leiomyoma of uterus, unspecified: Secondary | ICD-10-CM | POA: Diagnosis not present

## 2024-06-17 DIAGNOSIS — F32A Depression, unspecified: Secondary | ICD-10-CM | POA: Insufficient documentation

## 2024-06-17 DIAGNOSIS — D251 Intramural leiomyoma of uterus: Secondary | ICD-10-CM | POA: Diagnosis not present

## 2024-06-17 DIAGNOSIS — F410 Panic disorder [episodic paroxysmal anxiety] without agoraphobia: Secondary | ICD-10-CM | POA: Diagnosis not present

## 2024-06-17 DIAGNOSIS — N938 Other specified abnormal uterine and vaginal bleeding: Secondary | ICD-10-CM

## 2024-06-17 DIAGNOSIS — N946 Dysmenorrhea, unspecified: Secondary | ICD-10-CM | POA: Diagnosis not present

## 2024-06-17 DIAGNOSIS — D250 Submucous leiomyoma of uterus: Secondary | ICD-10-CM | POA: Diagnosis not present

## 2024-06-17 DIAGNOSIS — Z01818 Encounter for other preprocedural examination: Secondary | ICD-10-CM

## 2024-06-17 DIAGNOSIS — D252 Subserosal leiomyoma of uterus: Secondary | ICD-10-CM

## 2024-06-17 HISTORY — PX: HYSTERECTOMY, TOTAL, LAPAROSCOPIC, ROBOT-ASSISTED WITH SALPINGECTOMY: SHX7587

## 2024-06-17 LAB — TYPE AND SCREEN
ABO/RH(D): O POS
Antibody Screen: NEGATIVE

## 2024-06-17 SURGERY — HYSTERECTOMY, TOTAL, LAPAROSCOPIC, ROBOT-ASSISTED WITH SALPINGECTOMY
Anesthesia: General | Site: Abdomen

## 2024-06-17 MED ORDER — ROCURONIUM BROMIDE 10 MG/ML (PF) SYRINGE
PREFILLED_SYRINGE | INTRAVENOUS | Status: DC | PRN
  Administered 2024-06-17: 100 mg via INTRAVENOUS

## 2024-06-17 MED ORDER — PHENYLEPHRINE 80 MCG/ML (10ML) SYRINGE FOR IV PUSH (FOR BLOOD PRESSURE SUPPORT)
PREFILLED_SYRINGE | INTRAVENOUS | Status: DC | PRN
  Administered 2024-06-17: 120 ug via INTRAVENOUS

## 2024-06-17 MED ORDER — CEFAZOLIN SODIUM-DEXTROSE 2-4 GM/100ML-% IV SOLN
2.0000 g | INTRAVENOUS | Status: AC
  Administered 2024-06-17: 2 g via INTRAVENOUS

## 2024-06-17 MED ORDER — GABAPENTIN 300 MG PO CAPS: 300.0000 mg | ORAL_CAPSULE | Freq: Three times a day (TID) | ORAL | 0 refills | Status: DC

## 2024-06-17 MED ORDER — LACTATED RINGERS IV SOLN: INTRAVENOUS | Status: DC

## 2024-06-17 MED ORDER — DEXMEDETOMIDINE HCL IN NACL 80 MCG/20ML IV SOLN
INTRAVENOUS | Status: DC | PRN
  Administered 2024-06-17: 20 ug via INTRAVENOUS

## 2024-06-17 MED ORDER — DOCUSATE SODIUM 100 MG PO CAPS: 100.0000 mg | ORAL_CAPSULE | Freq: Two times a day (BID) | ORAL | 0 refills | Status: AC

## 2024-06-17 MED ORDER — SODIUM CHLORIDE 0.9 % IV SOLN: 12.5000 mg | INTRAVENOUS | Status: DC | PRN

## 2024-06-17 MED ORDER — IBUPROFEN 600 MG PO TABS: 600.0000 mg | ORAL_TABLET | Freq: Four times a day (QID) | ORAL | 0 refills | Status: AC | PRN

## 2024-06-17 MED ORDER — DEXMEDETOMIDINE HCL IN NACL 80 MCG/20ML IV SOLN
INTRAVENOUS | Status: AC
Start: 2024-06-17 — End: 2024-06-17
  Filled 2024-06-17: qty 20

## 2024-06-17 MED ORDER — OXYCODONE HCL 5 MG/5ML PO SOLN: 5.0000 mg | Freq: Once | ORAL | Status: AC | PRN

## 2024-06-17 MED ORDER — FENTANYL CITRATE (PF) 250 MCG/5ML IJ SOLN
INTRAMUSCULAR | Status: AC
  Filled 2024-06-17: qty 5

## 2024-06-17 MED ORDER — KETOROLAC TROMETHAMINE 30 MG/ML IJ SOLN
INTRAMUSCULAR | Status: AC
  Administered 2024-06-17: 30 mg via INTRAVENOUS
  Filled 2024-06-17: qty 1

## 2024-06-17 MED ORDER — STERILE WATER FOR IRRIGATION IR SOLN
Status: DC | PRN
Start: 2024-06-17 — End: 2024-06-17
  Administered 2024-06-17: 1000 mL

## 2024-06-17 MED ORDER — MIDAZOLAM HCL 2 MG/2ML IJ SOLN
INTRAMUSCULAR | Status: AC
  Filled 2024-06-17: qty 2

## 2024-06-17 MED ORDER — PROPOFOL 10 MG/ML IV BOLUS
INTRAVENOUS | Status: AC
  Filled 2024-06-17: qty 20

## 2024-06-17 MED ORDER — ACETAMINOPHEN 160 MG/5ML PO SOLN
960.0000 mg | Freq: Once | ORAL | Status: AC
  Filled 2024-06-17: qty 30

## 2024-06-17 MED ORDER — BUPIVACAINE HCL (PF) 0.25 % IJ SOLN
INTRAMUSCULAR | Status: DC | PRN
  Administered 2024-06-17: 40 mL

## 2024-06-17 MED ORDER — SUGAMMADEX SODIUM 200 MG/2ML IV SOLN
INTRAVENOUS | Status: DC | PRN
  Administered 2024-06-17: 400 mg via INTRAVENOUS

## 2024-06-17 MED ORDER — CEFAZOLIN SODIUM-DEXTROSE 2-4 GM/100ML-% IV SOLN
INTRAVENOUS | Status: AC
  Filled 2024-06-17: qty 100

## 2024-06-17 MED ORDER — MIDAZOLAM HCL (PF) 2 MG/2ML IJ SOLN
INTRAMUSCULAR | Status: DC | PRN
  Administered 2024-06-17: 2 mg via INTRAVENOUS

## 2024-06-17 MED ORDER — HEMOSTATIC AGENTS (NO CHARGE) OPTIME
TOPICAL | Status: DC | PRN
  Administered 2024-06-17: 1 via TOPICAL

## 2024-06-17 MED ORDER — CHLORHEXIDINE GLUCONATE 0.12 % MT SOLN
15.0000 mL | Freq: Once | OROMUCOSAL | Status: AC
  Administered 2024-06-17: 15 mL via OROMUCOSAL

## 2024-06-17 MED ORDER — ONDANSETRON 4 MG PO TBDP: 4.0000 mg | ORAL_TABLET | Freq: Three times a day (TID) | ORAL | 0 refills | Status: DC | PRN

## 2024-06-17 MED ORDER — ACETAMINOPHEN 500 MG PO TABS
ORAL_TABLET | ORAL | Status: AC
  Administered 2024-06-17: 1000 mg via ORAL
  Filled 2024-06-17: qty 2

## 2024-06-17 MED ORDER — FENTANYL CITRATE (PF) 50 MCG/ML IJ SOSY
25.0000 ug | PREFILLED_SYRINGE | INTRAMUSCULAR | Status: DC | PRN
  Administered 2024-06-17 (×2): 50 ug via INTRAVENOUS
  Filled 2024-06-17 (×2): qty 1

## 2024-06-17 MED ORDER — ACETAMINOPHEN 500 MG PO TABS: 1000.0000 mg | ORAL_TABLET | Freq: Once | ORAL | Status: AC

## 2024-06-17 MED ORDER — SCOPOLAMINE 1 MG/3DAYS TD PT72
1.0000 | MEDICATED_PATCH | Freq: Once | TRANSDERMAL | Status: DC
  Administered 2024-06-17: 1 mg via TRANSDERMAL
  Filled 2024-06-17: qty 1

## 2024-06-17 MED ORDER — OXYCODONE HCL 5 MG PO TABS
5.0000 mg | ORAL_TABLET | Freq: Once | ORAL | Status: AC | PRN
  Administered 2024-06-17: 5 mg via ORAL
  Filled 2024-06-17: qty 1

## 2024-06-17 MED ORDER — FENTANYL CITRATE (PF) 100 MCG/2ML IJ SOLN
INTRAMUSCULAR | Status: AC
  Filled 2024-06-17: qty 2

## 2024-06-17 MED ORDER — ROCURONIUM BROMIDE 10 MG/ML (PF) SYRINGE
PREFILLED_SYRINGE | INTRAVENOUS | Status: AC
  Filled 2024-06-17: qty 10

## 2024-06-17 MED ORDER — OXYCODONE HCL 5 MG PO TABS: 5.0000 mg | ORAL_TABLET | Freq: Four times a day (QID) | ORAL | 0 refills | Status: AC | PRN

## 2024-06-17 MED ORDER — ORAL CARE MOUTH RINSE: 15.0000 mL | Freq: Once | OROMUCOSAL | Status: AC

## 2024-06-17 MED ORDER — PHENYLEPHRINE 80 MCG/ML (10ML) SYRINGE FOR IV PUSH (FOR BLOOD PRESSURE SUPPORT)
PREFILLED_SYRINGE | INTRAVENOUS | Status: AC
  Filled 2024-06-17: qty 10

## 2024-06-17 MED ORDER — SODIUM CHLORIDE 0.9 % IR SOLN
Status: DC | PRN
  Administered 2024-06-17: 3000 mL

## 2024-06-17 MED ORDER — DEXAMETHASONE SOD PHOSPHATE PF 10 MG/ML IJ SOLN
INTRAMUSCULAR | Status: DC | PRN
Start: 2024-06-17 — End: 2024-06-17
  Administered 2024-06-17: 10 mg via INTRAVENOUS

## 2024-06-17 MED ORDER — FENTANYL CITRATE (PF) 250 MCG/5ML IJ SOLN
INTRAMUSCULAR | Status: DC | PRN
  Administered 2024-06-17 (×2): 100 ug via INTRAVENOUS
  Administered 2024-06-17: 50 ug via INTRAVENOUS
  Administered 2024-06-17: 100 ug via INTRAVENOUS

## 2024-06-17 MED ORDER — POVIDONE-IODINE 10 % EX SWAB
2.0000 | Freq: Once | CUTANEOUS | Status: AC
  Administered 2024-06-17: 2 via TOPICAL

## 2024-06-17 MED ORDER — KETOROLAC TROMETHAMINE 30 MG/ML IJ SOLN: 30.0000 mg | INTRAMUSCULAR | Status: AC

## 2024-06-17 MED ORDER — ONDANSETRON HCL 4 MG/2ML IJ SOLN
INTRAMUSCULAR | Status: AC
  Filled 2024-06-17: qty 2

## 2024-06-17 MED ORDER — DEXMEDETOMIDINE HCL IN NACL 80 MCG/20ML IV SOLN
INTRAVENOUS | Status: AC
  Filled 2024-06-17: qty 20

## 2024-06-17 MED ORDER — ONDANSETRON HCL 4 MG/2ML IJ SOLN
INTRAMUSCULAR | Status: DC | PRN
  Administered 2024-06-17: 4 mg via INTRAVENOUS

## 2024-06-17 MED ORDER — BUPIVACAINE HCL (PF) 0.25 % IJ SOLN
INTRAMUSCULAR | Status: AC
Start: 2024-06-17 — End: 2024-06-17
  Filled 2024-06-17: qty 60

## 2024-06-17 MED ORDER — PROPOFOL 10 MG/ML IV BOLUS
INTRAVENOUS | Status: DC | PRN
Start: 2024-06-17 — End: 2024-06-17
  Administered 2024-06-17: 200 mg via INTRAVENOUS

## 2024-06-17 SURGICAL SUPPLY — 53 items
BAG URINE DRAIN 2000ML AR STRL (UROLOGICAL SUPPLIES) ×1 IMPLANT
BLADE SURG SZ11 CARB STEEL (BLADE) ×1 IMPLANT
CAUTERY HOOK MNPLR 1.6 DVNC XI (INSTRUMENTS) ×1 IMPLANT
CHLORAPREP W/TINT 26 (MISCELLANEOUS) ×1 IMPLANT
COVER LIGHT HANDLE (MISCELLANEOUS) IMPLANT
COVER MAYO STAND XLG (MISCELLANEOUS) ×1 IMPLANT
DERMABOND ADVANCED .7 DNX12 (GAUZE/BANDAGES/DRESSINGS) ×1 IMPLANT
DRAPE ARM DVNC X/XI (DISPOSABLE) ×4 IMPLANT
DRAPE COLUMN DVNC XI (DISPOSABLE) ×1 IMPLANT
DRAPE UTILITY W/TAPE 26X15 (DRAPES) ×2 IMPLANT
DRIVER NDL MEGA 8 DVNC XI (INSTRUMENTS) ×2 IMPLANT
DRIVER NDLE MEGA DVNC XI (INSTRUMENTS) ×2 IMPLANT
ELECTRODE REM PT RTRN 9FT ADLT (ELECTROSURGICAL) ×1 IMPLANT
FORCEPS PROGRASP DVNC XI (FORCEP) ×1 IMPLANT
GAUZE 4X4 16PLY ~~LOC~~+RFID DBL (SPONGE) ×2 IMPLANT
GLOVE BIO SURGEON STRL SZ 6.5 (GLOVE) ×3 IMPLANT
GLOVE BIOGEL PI IND STRL 7.0 (GLOVE) ×6 IMPLANT
GLOVE BIOGEL PI IND STRL 7.5 (GLOVE) IMPLANT
GLOVE SURG SS PI 6.5 STRL IVOR (GLOVE) IMPLANT
GLOVE SURG SS PI 7.5 STRL IVOR (GLOVE) IMPLANT
GOWN STRL REUS W/ TWL LRG LVL3 (GOWN DISPOSABLE) ×3 IMPLANT
GOWN STRL REUS W/TWL LRG LVL3 (GOWN DISPOSABLE) ×2 IMPLANT
KIT PINK PAD W/HEAD ARM REST (MISCELLANEOUS) ×1 IMPLANT
KIT TURNOVER CYSTO (KITS) ×1 IMPLANT
MANIFOLD NEPTUNE II (INSTRUMENTS) ×1 IMPLANT
NDL HYPO 25X1 1.5 SAFETY (NEEDLE) ×1 IMPLANT
NDL INSUFFLATION 14GA 120MM (NEEDLE) ×1 IMPLANT
NEEDLE HYPO 25X1 1.5 SAFETY (NEEDLE) ×1 IMPLANT
NEEDLE INSUFFLATION 14GA 120MM (NEEDLE) ×1 IMPLANT
NS IRRIG 500ML POUR BTL (IV SOLUTION) ×1 IMPLANT
OBTURATOR OPTICALSTD 8 DVNC (TROCAR) ×1 IMPLANT
PACK PERI GYN (CUSTOM PROCEDURE TRAY) ×1 IMPLANT
POWDER SURGICEL 3.0 GRAM (HEMOSTASIS) ×1 IMPLANT
RUMI II GYRUS 4.0CM BLUE (DISPOSABLE) IMPLANT
SEAL UNIV 5-12 XI (MISCELLANEOUS) ×3 IMPLANT
SEALER VESSEL EXT DVNC XI (MISCELLANEOUS) ×1 IMPLANT
SET BASIN LINEN APH (SET/KITS/TRAYS/PACK) ×1 IMPLANT
SET TRI-LUMEN FLTR TB AIRSEAL (TUBING) ×1 IMPLANT
SET TUBE IRRIG SUCTION NO TIP (IRRIGATION / IRRIGATOR) ×1 IMPLANT
SOLUTION ANTFG W/FOAM PAD STRL (MISCELLANEOUS) IMPLANT
SPONGE T-LAP 18X18 ~~LOC~~+RFID (SPONGE) IMPLANT
SUT MNCRL AB 4-0 PS2 18 (SUTURE) ×1 IMPLANT
SUT STRATA PDS 0 30 CT-2.5 (SUTURE) ×2 IMPLANT
SUT VIC AB 0 CT1 27XBRD ANBCTR (SUTURE) IMPLANT
SYR 10ML LL (SYRINGE) ×2 IMPLANT
SYR 20ML LL LF (SYRINGE) ×1 IMPLANT
SYR CONTROL 10ML LL (SYRINGE) ×2 IMPLANT
SYR TOOMEY 50ML (SYRINGE) ×1 IMPLANT
TIP ENDOSCOPIC SURGICEL (TIP) ×1 IMPLANT
TIP UTERINE 6.7X10CM GRN DISP (MISCELLANEOUS) IMPLANT
TRAY FOL W/BAG SLVR 16FR STRL (SET/KITS/TRAYS/PACK) IMPLANT
TROCAR PORT AIRSEAL 8X120 (TROCAR) ×1 IMPLANT
WATER STERILE IRR 500ML POUR (IV SOLUTION) ×1 IMPLANT

## 2024-06-17 NOTE — Transfer of Care (Signed)
 Immediate Anesthesia Transfer of Care Note  Patient: Emily Morales  Procedure(s) Performed: HYSTERECTOMY, TOTAL, LAPAROSCOPIC, ROBOT-ASSISTED (Abdomen)  Patient Location: PACU  Anesthesia Type:General  Level of Consciousness: awake, alert , oriented, and patient cooperative  Airway & Oxygen  Therapy: Patient Spontanous Breathing and Patient connected to face mask oxygen   Post-op Assessment: Report given to RN, Post -op Vital signs reviewed and stable, and Patient moving all extremities X 4  Post vital signs: Reviewed and stable  Last Vitals:  Vitals Value Taken Time  BP 140/81 06/17/24 10:07  Temp 36.6 C 06/17/24 10:07  Pulse 98 06/17/24 10:10  Resp 16 06/17/24 10:12  SpO2 100 % 06/17/24 10:10  Vitals shown include unfiled device data.  Last Pain:  Vitals:   06/17/24 0651  TempSrc: Oral  PainSc: 3       Patients Stated Pain Goal: 10 (06/17/24 9348)  Complications: No notable events documented.

## 2024-06-17 NOTE — Anesthesia Postprocedure Evaluation (Signed)
 Anesthesia Post Note  Patient: Emily Morales  Procedure(s) Performed: HYSTERECTOMY, TOTAL, LAPAROSCOPIC, ROBOT-ASSISTED (Abdomen)  Patient location during evaluation: PACU Anesthesia Type: General Level of consciousness: awake and alert Pain management: pain level controlled Vital Signs Assessment: post-procedure vital signs reviewed and stable Respiratory status: spontaneous breathing, nonlabored ventilation and respiratory function stable Cardiovascular status: blood pressure returned to baseline and stable Postop Assessment: no apparent nausea or vomiting Anesthetic complications: no   There were no known notable events for this encounter.   Last Vitals:  Vitals:   06/17/24 1045 06/17/24 1100  BP: (!) 135/95 (!) 135/96  Pulse: 82 85  Resp: 15 16  Temp:  36.5 C  SpO2: 100% 100%    Last Pain:  Vitals:   06/17/24 1051  TempSrc:   PainSc: 7                  Sencere Symonette L Bookert Guzzi

## 2024-06-17 NOTE — Op Note (Signed)
 Pre Op Dx:   Abnormal uterine bleeding Uterine fibroids Dysmenorrhea Dyspareunia  Post Op Dx:   same  Procedure:   Robotic Assisted Total Laparoscopic Hysterectomy   Surgeon:  Dr. Delon Prude Anesthesia:  general   EBL:  30cc  IVF:  1500cc UOP:  50cc   Drains:  Foley catheter Specimen removed:  Uterus with cervix Findings:  Normal sized uterus, normal ovaries bilaterally.  Absent fallopian tubes. Complications: None  Description of procedure:  After informed consent the patient was taken to the operating room and placed in dorsal supine position where general endotracheal anesthesia was administered and found to be adequate.  She was placed in dorsal lithotomy position with her arms tucked.  She was prepped and draped in the usual sterile fashion.  A timeout was called and the procedure confirmed.  A RUMI uterine manipulator with the Koh cup and a Foley catheter were placed.   An incision was made in the supraumbilical area, with initial attempt at entry significant resistant was noted and there was concern that it was too close to the prior mesh.  An additional incision was made another 2cm above umbilicus to avoid the mesh, Veress needle was inserted into the abdominal cavity without difficulty. Proper placement was confirmed using the saline drop test and opening pressure was 3 mmHg. A pneumoperitoneum was obtained. The laparoscopic trocar and the laparoscope were placed under direct visualization.  Two additional 7mm ports were placed on either side of the umbilicus and an 8mm port was placed in the left upper quadrant under direct visualization.  The patient was placed in Trendelenburg position and the [employer] robotic device was docked.  Next, attention was turned to the console where the hysterectomy was performed.  Using the vessel sealer,  the uteroovarian anastamosis was divided and the right round ligament was divided.  This process was repeated on the contralateral side.   The anterior leaflet of the broad ligament was divided to create a bladder flap. A small anterior colpotomy incision was made to mark the Koh cup. The uterine artery and vein were skeletonized and desiccated superior to the Koh cup.  This process was repeated on the contralateral side.  The colpotomy was completed along the ridge of the Koh cup and the uterus was passed off the field. The vaginal occluder was placed in the vagina to maintain pneumoperitoneum.  The vaginal cuff was then closed with Stratifix suture in a double layer fashion. Ureters with peristalsis were visualized bilaterally.  Hemostasis confirmed. Arista powder was placed on the vaginal cuff and adnexa bilaterally.  The Da Vinci robotic device was undocked.  Under direct visualization TAP block was completed under direct visualization using 10cc of 0.25% marcaine  in each of four locations.  Airseal was deflated and trocars were removed.The skin was closed with 4-0 Vicryl in subcuticular fashion with skin glue placed atop each port site.    The patient was returned to dorsal supine position, awakened and extubated in the OR having appeared to tolerate the procedure well.  All sponge, needle, and instrument counts were correct x 2 at the end of the case.  Pt tolerated procedure well and was taken to recovery in stable condition.   Geovanie Winnett, DO Attending Obstetrician & Gynecologist, [facility] for [employer], [facility]

## 2024-06-17 NOTE — Anesthesia Procedure Notes (Signed)
 Procedure Name: Intubation Date/Time: 06/17/2024 7:49 AM  Performed by: Cordella Elvie HERO, CRNAPre-anesthesia Checklist: Patient identified, Emergency Drugs available, Suction available, Patient being monitored and Timeout performed Patient Re-evaluated:Patient Re-evaluated prior to induction Oxygen  Delivery Method: Circle system utilized Preoxygenation: Pre-oxygenation with 100% oxygen  Induction Type: IV induction Ventilation: Oral airway inserted - appropriate to patient size Laryngoscope Size: Mac and 3 Grade View: Grade I Tube type: Oral Tube size: 7.0 mm Number of attempts: 2 Airway Equipment and Method: Stylet Placement Confirmation: ETT inserted through vocal cords under direct vision, positive ETCO2, CO2 detector and breath sounds checked- equal and bilateral Secured at: 22 cm Tube secured with: Tape Dental Injury: Teeth and Oropharynx as per pre-operative assessment  Comments: DL by medical resident with ETT placed in esophagus, immediately removed, upon mask ventilation clear liquid noted coming out of  patient's mouth. Airway suctioned for approximately 10 cc's clear liquid. Mask ventilation resumed with oral airway. VSS, DL by CRNA, CANDIE Cordella with successful intubation, cloudy white secretions noted in ETT which  were immediately suctioned out with suction catheter with approximately 3cc's of fluid suctioned out. Dr. Landry notified of occurrence, chest xray will be preformed in PACU. VSS, and procedure resumed.

## 2024-06-17 NOTE — Discharge Instructions (Signed)
 Post Operative Pain Med Plan:  >Take gabapentin  300 mg three times per day, as prescribed for 4 days, try to space them evenly  >Take Ibuprofen  600mg  every 6 hours scheduled for the first 3 days.  After, you may take every 6 hours as needed.    >In between the Ibuprofen , take Tylenol  (over the counter) every 6 to 8 hours.  If the Tylenol  does not seem strong enough.  Take the tylenol  along with the oxycodone  every 6 hours If the oxycodone  seems to strong then just take Tylenol   >Oxycodone  will cause constipation, please be sure to take a stool softener (Colace) twice daily while taking this pain medication and/or continue this medication until your bowel regimen returns to normal  If possible try to take the Toradol  or Ibuprofen  with food to help avoid upsetting your stomach  >Use a heating pad as well as needed  >I have also sent a prescription for zofran  (ondansetron ) for nausea to take if needed over the first couple of days  >Be gentle with your diet the first few days, liquids and soft non spicy food, fruits are great  >Get up and move, no lifting or straining  HOME INSTRUCTIONS  Please note any unusual or excessive bleeding, pain, swelling. Mild dizziness or drowsiness are normal for about 24 hours after surgery.   Shower when comfortable  Restrictions: No driving for 24 hours or while taking pain medications.  Activity:  No heavy lifting (> 10 lbs), nothing in vagina (no tampons, douching, or intercourse) x 4 weeks; no tub baths for 4 weeks Vaginal spotting is expected but if your bleeding is heavy, period like,  please call the office   Incision: the bandaids will fall off when they are ready to; you may clean your incision with mild soap and water  but do not rub or scrub the incision site.  You may experience slight bloody drainage from your incision periodically.  This is normal.  If you experience a large amount of drainage or the incision opens, please call your  physician who will likely direct you to the emergency department.  Diet:  You may return to your regular diet.  Do not eat large meals.  Eat small frequent meals throughout the day.  Continue to drink a good amount of water  at least 6-8 glasses of water  per day, hydration is very important for the healing process.  Pain Management: Take Ibuprofen  every 6 hours with food as needed for pain.  For moderate to severe pain, a prescription of percocet has been sent in.  You can alternate this medication with the ibuprofen .   -If the Percocet is too strong, switch to over the counter tylenol  instead.   -Do not take Percocet (oxycodone /acetaminophen ) and Tylenol  (acetaminophen ) together (percocet has tylenol  in it).  Always take prescription pain medication with food.  Percocet may cause constipation, you may want to take a stool softener while taking this medication.  A prescription of colace has been sent in to take twice daily if needed while taking the oxycodone .  Be sure to drink plenty of fluids and increase your fiber to help with constipation.  Alcohol -- Avoid for 24 hours and while taking pain medications.  Nausea: Take sips of ginger ale or soda  Fever -- Call physician if temperature over 101 degrees  Follow up:  If you do not already have a follow up appointment scheduled, please call the office at [phone].  If you experience fever (a temperature greater than  100.4), pain unrelieved by pain medication, shortness of breath, swelling of a single leg, or any other symptoms which are concerning to you please the office immediately.

## 2024-06-18 ENCOUNTER — Encounter (HOSPITAL_COMMUNITY): Payer: Self-pay | Admitting: Obstetrics & Gynecology

## 2024-06-19 LAB — SURGICAL PATHOLOGY

## 2024-06-20 ENCOUNTER — Ambulatory Visit: Payer: Self-pay | Admitting: Obstetrics & Gynecology

## 2024-06-24 ENCOUNTER — Encounter: Payer: Self-pay | Admitting: Obstetrics & Gynecology

## 2024-06-24 ENCOUNTER — Ambulatory Visit: Admitting: Obstetrics & Gynecology

## 2024-06-24 VITALS — BP 134/88 | HR 93 | Ht 66.0 in | Wt 212.0 lb

## 2024-06-24 DIAGNOSIS — Z4889 Encounter for other specified surgical aftercare: Secondary | ICD-10-CM

## 2024-06-24 DIAGNOSIS — Z48816 Encounter for surgical aftercare following surgery on the genitourinary system: Secondary | ICD-10-CM

## 2024-06-24 NOTE — Progress Notes (Signed)
    PostOp Visit Note  Emily Morales is a 33 y.o. [id number] female who presents for a postoperative visit. She is 1 week postop following a RAH, BS completed on 11/18   Today she notes she is doing well.  Had one episode of emesis, but nothing since.  Not taking zofran .  Denies fever or chills.  Tolerating gen diet.  +Flatus, Regular BMs.  Pain is tolerable, not taking oxycodone .  Just ibuprofen /tylenol  as needed.  Overall doing well and reports no acute complaints   Review of Systems Pertinent items are noted in HPI.    Objective:  BP 134/88 (BP Location: Right Arm, Patient Position: Sitting, Cuff Size: Normal)   Pulse 93   Ht 5' 6 (1.676 m)   Wt 212 lb (96.2 kg)   LMP 05/21/2024 (Approximate)   BMI 34.22 kg/m    Physical Examination:  GENERAL ASSESSMENT: well developed and well nourished SKIN: warm and dry CHEST: normal air exchange, respiratory effort normal with no retractions HEART: regular rate and rhythm ABDOMEN: soft, non-distended, no tenderness, +BS INCISION: C/D/I with dermabond EXTREMITY: no edema or calf tenderness bilaterally PSYCH: mood appropriate, normal affect       Assessment:    Postop visit   Plan:   - Meeting milestones appropriately - Reviewed pelvic rest for another 8 weeks - Follow-up as scheduled  Rafia Shedden, DO Attending Obstetrician & Gynecologist, [facility] [employer], [facility]

## 2024-07-02 ENCOUNTER — Other Ambulatory Visit: Payer: Self-pay | Admitting: *Deleted

## 2024-07-10 ENCOUNTER — Encounter: Payer: Self-pay | Admitting: Obstetrics & Gynecology

## 2024-08-12 ENCOUNTER — Encounter: Payer: Self-pay | Admitting: Obstetrics & Gynecology

## 2024-08-12 ENCOUNTER — Ambulatory Visit: Admitting: Obstetrics & Gynecology

## 2024-08-12 VITALS — BP 124/77 | HR 84 | Ht 66.0 in | Wt 216.8 lb

## 2024-08-12 DIAGNOSIS — Z48816 Encounter for surgical aftercare following surgery on the genitourinary system: Secondary | ICD-10-CM

## 2024-08-12 DIAGNOSIS — Z4889 Encounter for other specified surgical aftercare: Secondary | ICD-10-CM

## 2024-08-12 NOTE — Progress Notes (Signed)
" ° ° °  PostOp Visit Note  Emily Morales is a 34 y.o. G35P2002 female who presents for a postoperative visit. She is 8 weeks postop following a RAH, BS completed on 11/18   Today she notes that she is doing well.  Denies vaginal bleeding or discharge.  Denies pelvic or abdominal pain.  She has noted some leg pain and discomfort that starts in her hips and radiates down her legs.  She states it started a few days ago around the time of her when she usually has a menses.  She also notes occasional hot flash. Denies fever or chills.  Tolerating gen diet.  +Flatus, Regular BMs.   Overall doing well and reports no acute complaints   Review of Systems Pertinent items are noted in HPI.    Objective:  BP 124/77 (BP Location: Right Arm, Patient Position: Sitting, Cuff Size: Normal)   Pulse 84   Ht 5' 6 (1.676 m)   Wt 216 lb 12.8 oz (98.3 kg)   LMP 05/21/2024   BMI 34.99 kg/m    Physical Examination:  GENERAL ASSESSMENT: well developed and well nourished SKIN: normal color, no lesions CHEST: normal air exchange, respiratory effort normal with no retractions HEART: regular rate and rhythm ABDOMEN: soft, non-distended, no rebound or guarding INCISION: well-healed clean dry and intact GU: Normal external genitalia, normal-appearing urethra, vaginal pink moist mucosa-minimal white discharge, vaginal cuff appears visually intact with no abnormalities noted.  On bimanual exam vaginal cuff intact with no defects.  No tenderness on exam EXTREMITY: no edema or calf tenderness bilaterally PSYCH: mood appropriate, normal affect       Assessment:    Postop visit   Plan:   -Meeting postop milestones appropriately - Patient may return to regular activity - Discussed herbal supplements and other OTC remedies to help with perimenopausal symptoms - No further Pap smear indicated due to hysterectomy for benign condition - Follow-up as needed  Adler Chartrand, DO Attending Obstetrician &  Gynecologist, [facility] [employer], [facility]   "
